# Patient Record
Sex: Male | Born: 1935
Health system: Southern US, Community
[De-identification: ages and names within clinical notes are randomized; demographics above are authoritative.]

## PROBLEM LIST (undated history)

## (undated) DIAGNOSIS — Z9189 Other specified personal risk factors, not elsewhere classified: Secondary | ICD-10-CM

## (undated) DIAGNOSIS — Z972 Presence of dental prosthetic device (complete) (partial): Secondary | ICD-10-CM

## (undated) DIAGNOSIS — Z8679 Personal history of other diseases of the circulatory system: Secondary | ICD-10-CM

## (undated) DIAGNOSIS — Z974 Presence of external hearing-aid: Secondary | ICD-10-CM

## (undated) DIAGNOSIS — R413 Other amnesia: Secondary | ICD-10-CM

## (undated) DIAGNOSIS — N189 Chronic kidney disease, unspecified: Secondary | ICD-10-CM

## (undated) DIAGNOSIS — N281 Cyst of kidney, acquired: Secondary | ICD-10-CM

## (undated) DIAGNOSIS — N201 Calculus of ureter: Secondary | ICD-10-CM

## (undated) DIAGNOSIS — I6523 Occlusion and stenosis of bilateral carotid arteries: Secondary | ICD-10-CM

## (undated) DIAGNOSIS — E785 Hyperlipidemia, unspecified: Secondary | ICD-10-CM

## (undated) DIAGNOSIS — K219 Gastro-esophageal reflux disease without esophagitis: Secondary | ICD-10-CM

## (undated) DIAGNOSIS — T4145XA Adverse effect of unspecified anesthetic, initial encounter: Secondary | ICD-10-CM

## (undated) DIAGNOSIS — N200 Calculus of kidney: Secondary | ICD-10-CM

## (undated) DIAGNOSIS — T8859XA Other complications of anesthesia, initial encounter: Secondary | ICD-10-CM

## (undated) DIAGNOSIS — L409 Psoriasis, unspecified: Secondary | ICD-10-CM

## (undated) DIAGNOSIS — N529 Male erectile dysfunction, unspecified: Secondary | ICD-10-CM

## (undated) DIAGNOSIS — I471 Supraventricular tachycardia, unspecified: Secondary | ICD-10-CM

## (undated) DIAGNOSIS — Z973 Presence of spectacles and contact lenses: Secondary | ICD-10-CM

## (undated) DIAGNOSIS — R0989 Other specified symptoms and signs involving the circulatory and respiratory systems: Secondary | ICD-10-CM

## (undated) DIAGNOSIS — I440 Atrioventricular block, first degree: Secondary | ICD-10-CM

## (undated) DIAGNOSIS — K08109 Complete loss of teeth, unspecified cause, unspecified class: Secondary | ICD-10-CM

## (undated) DIAGNOSIS — I10 Essential (primary) hypertension: Secondary | ICD-10-CM

## (undated) DIAGNOSIS — Z9889 Other specified postprocedural states: Secondary | ICD-10-CM

## (undated) DIAGNOSIS — Z87442 Personal history of urinary calculi: Secondary | ICD-10-CM

## (undated) DIAGNOSIS — R002 Palpitations: Secondary | ICD-10-CM

## (undated) DIAGNOSIS — I2510 Atherosclerotic heart disease of native coronary artery without angina pectoris: Secondary | ICD-10-CM

## (undated) HISTORY — PX: COLONOSCOPY W/ POLYPECTOMY: SHX1380

## (undated) HISTORY — PX: EYE SURGERY: SHX253

## (undated) HISTORY — PX: CHOLECYSTECTOMY: SHX55

## (undated) HISTORY — DX: Other amnesia: R41.3

## (undated) HISTORY — DX: Essential (primary) hypertension: I10

## (undated) HISTORY — PX: CARDIAC CATHETERIZATION: SHX172

---
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
## 1979-01-14 HISTORY — PX: NEPHROLITHOTOMY: SUR881

## 1980-01-14 HISTORY — PX: CHOLECYSTECTOMY OPEN: SUR202

## 1999-07-03 ENCOUNTER — Ambulatory Visit (HOSPITAL_COMMUNITY): Admission: RE | Admit: 1999-07-03 | Discharge: 1999-07-03 | Payer: Self-pay | Admitting: Internal Medicine

## 1999-07-03 ENCOUNTER — Encounter: Payer: Self-pay | Admitting: Internal Medicine

## 1999-08-16 ENCOUNTER — Encounter (INDEPENDENT_AMBULATORY_CARE_PROVIDER_SITE_OTHER): Payer: Self-pay | Admitting: Specialist

## 1999-08-16 ENCOUNTER — Ambulatory Visit (HOSPITAL_COMMUNITY): Admission: RE | Admit: 1999-08-16 | Discharge: 1999-08-16 | Payer: Self-pay | Admitting: *Deleted

## 2002-11-06 ENCOUNTER — Encounter: Admission: RE | Admit: 2002-11-06 | Discharge: 2002-11-06 | Payer: Self-pay | Admitting: Orthopedic Surgery

## 2002-11-06 ENCOUNTER — Encounter: Payer: Self-pay | Admitting: Orthopedic Surgery

## 2002-11-17 ENCOUNTER — Ambulatory Visit (HOSPITAL_COMMUNITY): Admission: RE | Admit: 2002-11-17 | Discharge: 2002-11-17 | Payer: Self-pay | Admitting: Internal Medicine

## 2002-11-23 ENCOUNTER — Ambulatory Visit (HOSPITAL_COMMUNITY): Admission: RE | Admit: 2002-11-23 | Discharge: 2002-11-23 | Payer: Self-pay | Admitting: Orthopedic Surgery

## 2002-11-23 HISTORY — PX: KNEE ARTHROSCOPY W/ DEBRIDEMENT: SHX1867

## 2003-01-14 HISTORY — PX: CARPAL TUNNEL RELEASE: SHX101

## 2003-01-25 ENCOUNTER — Encounter (INDEPENDENT_AMBULATORY_CARE_PROVIDER_SITE_OTHER): Payer: Self-pay | Admitting: Specialist

## 2003-01-25 ENCOUNTER — Ambulatory Visit (HOSPITAL_COMMUNITY): Admission: RE | Admit: 2003-01-25 | Discharge: 2003-01-25 | Payer: Self-pay | Admitting: *Deleted

## 2004-08-06 ENCOUNTER — Ambulatory Visit (HOSPITAL_COMMUNITY): Admission: RE | Admit: 2004-08-06 | Discharge: 2004-08-06 | Payer: Self-pay | Admitting: *Deleted

## 2006-11-07 ENCOUNTER — Emergency Department (HOSPITAL_COMMUNITY): Admission: EM | Admit: 2006-11-07 | Discharge: 2006-11-08 | Payer: Self-pay | Admitting: Emergency Medicine

## 2008-04-05 ENCOUNTER — Encounter (INDEPENDENT_AMBULATORY_CARE_PROVIDER_SITE_OTHER): Payer: Self-pay | Admitting: *Deleted

## 2008-04-05 ENCOUNTER — Ambulatory Visit (HOSPITAL_COMMUNITY): Admission: RE | Admit: 2008-04-05 | Discharge: 2008-04-05 | Payer: Self-pay | Admitting: *Deleted

## 2009-01-13 HISTORY — PX: CATARACT EXTRACTION, BILATERAL: SHX1313

## 2009-01-18 ENCOUNTER — Encounter: Admission: RE | Admit: 2009-01-18 | Discharge: 2009-01-18 | Payer: Self-pay | Admitting: Internal Medicine

## 2009-02-21 ENCOUNTER — Ambulatory Visit: Payer: Self-pay | Admitting: Vascular Surgery

## 2009-10-16 ENCOUNTER — Ambulatory Visit: Payer: Self-pay | Admitting: Cardiology

## 2010-03-12 ENCOUNTER — Encounter (INDEPENDENT_AMBULATORY_CARE_PROVIDER_SITE_OTHER): Payer: Medicare Other

## 2010-03-12 ENCOUNTER — Ambulatory Visit (INDEPENDENT_AMBULATORY_CARE_PROVIDER_SITE_OTHER): Payer: Medicare Other | Admitting: Vascular Surgery

## 2010-03-12 DIAGNOSIS — I714 Abdominal aortic aneurysm, without rupture: Secondary | ICD-10-CM

## 2010-03-13 NOTE — Assessment & Plan Note (Signed)
OFFICE VISIT  Brandon Fischer, Brandon Fischer DOB:  1933/10/27                                       03/12/2010 [id number]#:[id number]  The patient presents today for follow-up of infrarenal abdominal aortic aneurysm.  I had seen him 6 months ago, at which time he had undergone a screening study in an outlying vascular lab suggesting a 3.7-cm aneurysm.  I recommended that we see him again for follow-up.  He underwent a repeat ultrasound in our office today.  He reports no medical difficulties since my visit with him 1 year ago.  He has no major medical difficulties.  He is on Lipitor for elevated cholesterol and metoprolol for hypertension.  He does take daily aspirin.  On physical exam, a well-developed, well-nourished white male appearing stated age of 41, in no acute distress.  Blood pressure is 152/71, pulse 65, respirations 20.  HEENT is normal.  Abdomen:  Moderately obese.  I do not palpate an aneurysm.  Musculoskeletal:  No major deformity or cyanosis.  Neurologic:  No focal weakness or paresthesias.  Skin: Without ulcers or rashes.  His radial pulses and femoral pulses are 2+. He has 1+ to 2+ popliteal pulse and 1+ posterior tibial pulses bilaterally.  He underwent repeat ultrasound in our office today, and this reveals significantly a smaller aneurysm than was seen on the outside lab, maximal diameter was 2.8 cm.  I discussed this at length with the patient and his wife.  I have recommend discontinuation of any further follow-up.  I explained that this is below threshold of aneurysm and is simply ectasia of his aorta.  At his age of 45 I would not recommend a follow-up.  He understands and will see [place] again on an as-needed basis.    Larina Earthly, M.D. Electronically Signed  TFE/MEDQ  D:  03/12/2010  T:  03/13/2010  Job:  [id number]  cc:   Soyla Murphy. Renne Crigler, M.D.

## 2010-03-14 ENCOUNTER — Other Ambulatory Visit: Payer: Self-pay | Admitting: Gastroenterology

## 2010-03-20 ENCOUNTER — Ambulatory Visit
Admission: RE | Admit: 2010-03-20 | Discharge: 2010-03-20 | Disposition: A | Discharge status: Home / Self Care | Payer: Medicare Other | Source: Ambulatory Visit | Attending: Gastroenterology | Admitting: Gastroenterology

## 2010-03-27 NOTE — Procedures (Unsigned)
DUPLEX ULTRASOUND OF ABDOMINAL AORTA  INDICATION:  Followup AAA found on screening.  HISTORY: Diabetes:  No. Cardiac:  No. Hypertension:  No. Smoking:  Quit. Connective Tissue Disorder: Family History:  No. Previous Surgery:  No.  DUPLEX EXAM:         AP (cm)                   TRANSVERSE (cm) Proximal             2.8 cm                    NV Mid                  1.60 cm                     NV Distal               1.55 cm                   NV Right Iliac          0.92 cm                   NV Left Iliac           0.77 cm                   NV  PREVIOUS:  Date:  01/18/2009  AP:  3.7  TRANSVERSE:  IMPRESSION:  No evidence of obvious abdominal aortic aneurysm on today's exam.  ___________________________________________ Larina Earthly, M.D.  LT/MEDQ  D:  03/12/2010  T:  03/12/2010  Job:  [id number]

## 2010-05-28 NOTE — Consult Note (Signed)
NEW PATIENT CONSULTATION   Brandon Fischer, Brandon Fischer  DOB:  [birth year]                                       02/21/2009  ZOXWR#:[id number]   This patient presents today for evaluation of incidental finding of an  abdominal aortic aneurysm.  He underwent a screening ultrasound and was  found to have a 3.7-cm aneurysm.  He has no symptoms referable to this.  He does not have history of prior peripheral vascular occlusive disease.  He does not have any family history of aneurysm.  He does have no major  medical difficulties in the past, specifically no cardiac or pulmonary  disease.  He does have some gastroesophageal reflux.   PAST MEDICAL HISTORY:  Significant for psoriasis, adenomatous polyp,  chronic liver enzyme elevation and chronic kidney stones.   ALLERGIES:  None.   SOCIAL HISTORY:  He is a retired Doctor, hospital.  He does remain quite  active.  He quit smoking in 2000.  He does not drink alcohol.   FAMILY HISTORY:  Mother died of cancer at age 72.  Father died of  cardiac death at age 35.   REVIEW OF SYSTEMS:  Negative for weight loss or weight gain, for cardiac  chest issues.  PULMONARY:  Negative.  GI:  Negative.  GU:  Negative.  NEUROLOGIC:  Negative for seizure or stroke.  MUSCULOSKELETAL:  Negative for arthritic joint pain.  PSYCHIATRIC:  No history of depression or anxiety.  HEMATOLOGIC:  No hematologic issues.  SKIN:  No skin issues other than the psoriasis.   PHYSICAL EXAMINATION:  General:  Well-developed, well-nourished white  male appearing stated age of 77.  Vital signs:  His blood pressure  145/79, heart rate 64, respirations 18.  He has no acute distress.  HEENT:  Normal.  Chest:  No JVD, cervical lymphadenopathy or bruits.  Lungs:  Clear to auscultation bilaterally without wheezing.  Cardiovascular:  Heart is regular rate and rhythm without murmurs.  He  has 2+ radial, femoral, popliteal and posterior tibial pulses but no  evidence of  peripheral aneurysms.  Abdomen:  Soft.  Mildly obese with no  palpable mass and no prominent aortic pulsation is noted.  Musculoskeletal:  No major deformities or cyanosis.  Neurologic:  No  focal weakness or paresthesias.  Skin:  No ulcers or rashes.   I reviewed his ultrasound with the patient from 01/18/2009.  This does  show a maximal diameter of 3.7 cm.  I have discussed __________ with the  patient and explained that he does not have any change in his current  activity and does not need to restrict his activity or lifting or  straining.  I did explain symptoms of leaking aneurysm to him and he  knows to present immediately to [facility] should this occur, otherwise  we will see him again in 1 year with repeat ultrasound at that time.     Larina Earthly, M.D.  Electronically Signed   TFE/MEDQ  D:  02/21/2009  T:  02/21/2009  Job:  [id number]   cc:   Soyla Murphy. Renne Crigler, M.D.

## 2010-05-28 NOTE — Op Note (Signed)
NAME:  Brandon Fischer, Brandon Fischer              ACCOUNT NO.:  [id number]   MEDICAL RECORD NO.:  [id number]          PATIENT TYPE:  AMB   LOCATION:  ENDO                         FACILITY:  [facility]   PHYSICIAN:  Georgiana Spinner, M.D.    DATE OF BIRTH:  [birth year]   DATE OF PROCEDURE:  04/05/2008  DATE OF DISCHARGE:                               OPERATIVE REPORT   PROCEDURE:  Colonoscopy with biopsy.   INDICATIONS:  Colon polyp.   ANESTHESIA:  Fentanyl 70 mcg, Versed 7 mg.   PROCEDURE:  With the patient mildly sedated in the left lateral  decubitus position, a rectal examination was performed which was  unremarkable to my examination.  Subsequently the Pentax videoscopic  colonoscope was inserted in the rectum and passed under direct vision to  the cecum, identified by ileocecal valve and appendiceal orifice, both  of which were photographed.  From this point colonoscope was slowly  withdrawn taking circumferential views of colonic mucosa, stopping in  the hepatic flexure where a small polyp was seen, photographed and  removed using hot biopsy forceps technique, setting of 20/150 blended  current.  The endoscope was then withdrawn all the way to the rectum  which appeared normal on direct.  Showed hemorrhoids on retroflexed  view.  The endoscope was straightened and withdrawn.  The patient's  vital signs and  pulse oximeter remained stable.  The patient tolerated  procedure well without apparent complications.   FINDINGS:  Internal hemorrhoids and polyp of hepatic flexure.  Await  biopsy report.  The patient will call me for results and follow up with  me as an outpatient..           ______________________________  Georgiana Spinner, M.D.     GMO/MEDQ  D:  04/05/2008  T:  04/05/2008  Job:  [id number]

## 2010-05-31 NOTE — Op Note (Signed)
NAME:  Brandon Fischer, Brandon Fischer                        ACCOUNT NO.:  [id number]   MEDICAL RECORD NO.:  [id number]                   PATIENT TYPE:  AMB   LOCATION:  ENDO                                 [facility]ACILITY:  [facility]   PHYSICIAN:  Georgiana Spinner, M.D.                 DATE OF BIRTH:  May 03, 1933   DATE OF PROCEDURE:  01/25/2003  DATE OF DISCHARGE:                                 OPERATIVE REPORT   PROCEDURE:  Colonoscopy.   INDICATIONS:  Colon polyps.   ANESTHESIA:  Demerol 40 mg, Versed 3 mg.   DESCRIPTION OF PROCEDURE:  With the patient mildly sedated in the left  lateral decubitus position, the Olympus videoscopic colonoscope was inserted  into the rectum  and passed under direct vision to the cecum; this was  identified by the crow's foot of the cecum.  From this point the colonoscope  was slowly withdrawn, taking circumferential views of the colonic mucosa,  stopping first to biopsy part of the crow's foot that appeared somewhat  thickened.  This may have been normal, but I could not view it well because  of glare from the scope.  Once this was accomplished the scope was  withdrawn, taking circumferential views of the colonic mucosa.  We stopped  only next in the rectum, which appeared normal in direct and showed  hemorrhoids with the retroflex view.  The scope was straightened and  withdrawn.  The patient's vital signs and pulse oximetry remained stable.  The patient tolerated the procedure well and all without apparent  complications.   FINDINGS:  1. Thickened crow's foot of the cecum.  2. Internal hemorrhoids.  3. Otherwise unremarkable examination.   PLAN:  Await biopsy report.  The patient will call me for results and follow  up with me as an outpatient.                                               Georgiana Spinner, M.D.    GMO/MEDQ  D:  01/25/2003  T:  01/25/2003  Job:  [id number]

## 2010-05-31 NOTE — Cardiovascular Report (Signed)
NAME:  Brandon Fischer, Brandon Fischer NO.:  [id number]   MEDICAL RECORD NO.:  [id number]          PATIENT TYPE:  OIB   LOCATION:  2899                         FACILITY:  [facility]   PHYSICIAN:  Elmore Guise., M.D.DATE OF BIRTH:  [birth year]   DATE OF PROCEDURE:  08/06/2004  DATE OF DISCHARGE:                              CARDIAC CATHETERIZATION   PROCEDURE:  Cardiac catheterization.   INDICATIONS FOR PROCEDURE:  Abnormal stress test.   DESCRIPTION OF PROCEDURE:  The patient was brought to the cardiac cath lab  after proper informed consent.  He was prepped and draped in a sterile  fashion.  Approximately 15 mL of 1% lidocaine were used for local  anesthesia.  A 6-French sheath was placed in the right femoral artery  without difficulty.  Coronary angiography and LV angiography were then  performed.  The patient tolerated procedure well.  He was transferred from  the cardiac cath lab in stable condition.   RESULTS:  1.  Left main:  Mild diffuse calcification with distal 20% to 30% tapering      stenosis.  2.  LAD:  Ostial proximal 20% to 30% stenosis with mild mid and distal      luminal irregularities.  3.  LCX:  Nondominant, ostial proximal 30% to 40% stenosis with mid and      distal luminal irregularities.  4.  OM-1 and OM-2:  Moderate size with mild luminal irregularities.  5.  RCA:  Dominant with proximal 20% to 30% stenosis followed by a mid-      luminal irregularities and distal 40% stenosis.  6.  PDA and PLV:  Show moderate luminal irregularities, but no significant      obstructive disease.  7.  LV:  EF is 55% to 60% and no wall motion abnormalities.  LVEDP is 26      mmHg.   IMPRESSION:  1.  Nonobstructive coronary arteries.  2.  Preserved  left ventricular systolic function with an ejection fraction      of 55% to 60%.   PLAN:  Aggressive risk factor modification to include lipid management and  blood pressure control as well as daily aspirin.       TWK/MEDQ  D:  08/06/2004  T:  08/06/2004  Job:  [id number]

## 2010-05-31 NOTE — Op Note (Signed)
NAME:  Brandon Fischer, Brandon Fischer                        ACCOUNT NO.:  [id number]   MEDICAL RECORD NO.:  [id number]                   PATIENT TYPE:  AMB   LOCATION:  DAY                                  FACILITY:  [facility]   PHYSICIAN:  Madlyn Frankel. Charlann Boxer, M.D.               DATE OF BIRTH:  01-22-1933   DATE OF PROCEDURE:  11/23/2002  DATE OF DISCHARGE:                                 OPERATIVE REPORT   PREOPERATIVE DIAGNOSES:  Right knee medial meniscal tear, right knee medial  plica, the knee tricompartmental chondromalacia.   POSTOPERATIVE DIAGNOSES/FINDINGS:  1. 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  2. Abundant anteromedial synovectomy with a large plica shelf medially.  3. Degenerative complex tear of the posterior horn of the medial meniscus.  4. Degenerative tear with cleavage component to the mid portion of the     lateral meniscus.   PROCEDURE:  1. Diagnostic operative arthroscopy with tricompartmental chondroplasty.  2. Medial and lateral meniscal debridement.  3. Anteromedial synovectomy.   SURGEON:  Madlyn Frankel. Charlann Boxer, M.D.   ASSISTANT:  None.   ANESTHESIA:  LMA.   COMPLICATIONS:  None apparent.   FINDINGS:  Above.   INDICATIONS FOR PROCEDURE:  Mr. Cromwell is a very pleasant 77 year old  gentleman who was seen in the clinic. Radiographs of his knee revealed some  evidence of degenerative change; however, he had persistent medial joint  line tenderness. A MRI was ordered which confirmed the diagnosis of medial  meniscal tear. There was no __________  lateral meniscus. Based on these  findings, I felt that he would be a good candidate for arthroscopic surgery  to debride the medial meniscus and relieve the symptoms and allow for normal  knee function. After discussing the risks and benefits, he consented for the  procedure.   DESCRIPTION OF PROCEDURE:  The patient was brought to the operative theatre.  Once adequate anesthesia and preoperative antibiotics were  administered, the  patient was positioned supine, the right lower extremity was then placed  into a leg holder. The right lower extremity was then prepped and draped in  a sterile fashion. Standard inferolateral, superolateral, inferomedial  portals were created for diagnostic evaluation of the knee. It was quite  obvious, the patient had abundant anterior synovitis with large plica shelf  on the anteromedial side. It was also evident that he had tricompartmental  degenerative changes mainly grade 2 but in the medial compartment it was  felt to be grade 3. Evaluation also revealed the posterior horn of the  medial meniscal tear as well as the lateral meniscal tear. Following  debridement of the synovium which would allow for adequate visualization of  the medial compartment, upbiting basket and shaver were used to debride the  posterior horn of the medial meniscus back to a stable level. Medial  compartment debridement was carried out using the shaver. The lateral  compartment was then entered, the mid  portion of the lateral meniscus was  noted to have a degenerative tear which was debrided using basket and shaver  as well.  Lateral femoral condyle was then debrided using a shaver.  The  patellofemoral joint was then debrided with most changes on the patellar  side of the lateral facet as  opposed to the trochlea. Trochlear changes  were more grade 1. Attention was directed to debriding the medial synovium  which had a tendency to obstruct the view into the patellofemoral joint and  definitely ran against the medial femoral condyles. Following all  procedures, the knee was irrigated and reexamined for any loose bodies. The  shaver was used to remove any free fragments floating. At this point, the  __________  was removed, the knee was evacuated of as much fluid as possible  and the portal sites reapproximated using 3-0 nylon. The knee was then  injected with 0.5% Marcaine with epinephrine.  The patient's knee was then  cleaned, dried and dressed sterilely with substantial dressings, sponges,  bulky dressing. The patient was taken to the recovery room in stable  condition.                                               Madlyn Frankel Charlann Boxer, M.D.    MDO/MEDQ  D:  11/23/2002  T:  11/23/2002  Job:  [id number]

## 2010-05-31 NOTE — Procedures (Signed)
[facility]  Patient:    Brandon Fischer, Brandon Fischer                     MRN: [id number] Proc. Date: 08/16/99 Adm. Date:  [id number] Attending:  Sabino Gasser                           Procedure Report  PROCEDURE PERFORMED:  Colonoscopy with polypectomy.  ENDOSCOPIST:  Sabino Gasser, M.D.  INDICATIONS:  Colon polyps.  ANESTHESIA:  Demerol 60 mg and Versed 7.5 mg.  DESCRIPTION OF PROCEDURE:  With the patient mildly sedated in the left lateral decubitus position, the Olympus videoscopic colonoscope was inserted into the rectum after a normal rectal exam and passed under direct vision to the cecum. The cecum identified by the ileocecal valve and appendiceal orifice both of which were photographed.  From this point, the colonoscope was slowly withdrawn taking circumferential views of the entire colonic mucosa stopping only at 50 cm from the anal verge where a small polyp was seen, photographed and removed using snare cautery technique with a setting of 20/20 blended current.  The endoscope was then withdrawn to the rectum which appeared normal on direct view and showed internal hemorrhoids on retroflex view of the endoscope, straightened and withdrawn.  The patients vital signs and pulse oximeter remained stable.  The patient tolerated the procedure well without apparent complications.  FINDINGS:  A polyp at 50 cm and internal hemorrhoids, otherwise unremarkable examination.  PLAN:  Repeat exam in one year. DD:  08/16/99 TD:  08/18/99 Job: [id number]

## 2010-05-31 NOTE — Op Note (Signed)
NAME:  Brandon Fischer, Brandon Fischer                        ACCOUNT NO.:  [id number]   MEDICAL RECORD NO.:  [id number]                   PATIENT TYPE:  AMB   LOCATION:  ENDO                                 [facility]ILITY[facility]  [facility]   PHYSICIAN:  Georgiana Spinner, M.D.                 DATE OF BIRTH:  October 25, 1933   DATE OF PROCEDURE:  DATE OF DISCHARGE:                                 OPERATIVE REPORT   PROCEDURE:  Upper endoscopy with biopsy.   INDICATIONS:  Gastroesophageal reflux disease.   ANESTHESIA:  Demerol 60 mg, Versed 6 mg.   DESCRIPTION OF PROCEDURE:  With the patient mildly sedated in the left  lateral decubitus position, the Olympus videoscopic endoscope was inserted  in the mouth and passed under direct vision through the esophagus into the  stomach.  The fundus and body appeared normal.  Antrum showed some  nodularity which was biopsied.  We entered into the duodenal bulb and second  portion.  Both appeared normal.  From this point, the endoscope was slowly  withdrawn taking circumferential views of the duodenal mucosa until the  endoscope then pulled back into the stomach, placed in retroflexion and  viewed the stomach from below.  The endoscope was then straightened,  withdrawn, taking circumferential views of the remaining gastric and  esophageal mucosa, stopping only in the distal esophagus to same the GE  junction to rule out Barrett's although endoscopically it was not obvious.  Endoscope was withdrawn.  The patient's vital signs and pulse oximetry  remained stable.  The patient tolerated the procedure well without apparent  complication.   FINDINGS:  1. Nodularity of the antrum, probably benign, biopsied.  2. Biopsy of the distal esophagus.   PLAN:  Await biopsy report.  The patient will call me for results and follow  up with me as an outpatient.                                               Georgiana Spinner, M.D.    GMO/MEDQ  D:  01/25/2003  T:  01/25/2003  Job:  [id number]

## 2010-05-31 NOTE — H&P (Signed)
NAME:  BUELL, PARCEL NO.:  [id number]   MEDICAL RECORD NO.:  [id number]          PATIENT TYPE:  OIB   LOCATION:                               FACILITY:  [facility]   PHYSICIAN:  Elmore Guise., M.D.DATE OF BIRTH:  08-24-1933   DATE OF ADMISSION:  08/06/2004  DATE OF DISCHARGE:                                HISTORY & PHYSICAL   PRIMARY PHYSICIAN:  Dr. Merri Brunette   HISTORY OF PRESENT ILLNESS:  This patient is a 77 year old white male with  no significant past medical history who presented to Dr. Carolee Rota office for  evaluation of left arm and left upper shoulder pain.  He stated that this  started in early June and it occasionally gets worse when he is up and  active.  He underwent a stress ECG exercising via Bruce protocol for 59  minutes, achieving a peak heart rate of 138 beats per minute corresponding  to greater than 85% of age-predicted maximum.  Patient had 1 mm ST  depression in leads V4-V6.  He does have strong family history of heart  disease in the male side of his family with his father dying of a heart  attack at age 77 and his brother having a heart attack in his mid to late  24s.  Otherwise, he is very active and has no significant complaints.  He  states that he has occasional numbness and tingling down his left forearm  which comes and goes and he underwent a nerve conduction test which was  negative for any nerve problems.  He has no orthopnea, no PND.  No  significant lower extremity edema.   REVIEW OF SYSTEMS:  Otherwise negative.   CURRENT MEDICATIONS:  1.  Aspirin 81 mg daily.  2.  Aleve p.r.n.   ALLERGIES:  None.   FAMILY HISTORY:  Positive for heart disease in his father and brother.  His  mother had ovarian cancer.   SOCIAL HISTORY:  He is retired from the [employer].  He is married.  He  walks approximately 30 minutes a day with brisk walk and swims in his  outdoor pool.  He drinks an occasional glass of alcohol and has  approximately 50-pack-year history of tobacco use.  However, he quit smoking  six years ago.   PAST SURGICAL HISTORY:  Left knee surgery and treatment of two kidney  stones.   PHYSICAL EXAMINATION:  VITAL SIGNS:  Weight is 213 pounds, blood pressure  160/78, his heart rate is 72 and regular.  GENERAL:  He is a very pleasant white male, alert and oriented x4.  No acute  distress.  NECK:  Supple.  No lymphadenopathy.  2+ carotids.  No JVD and no bruits.  LUNGS:  Clear.  HEART:  Regular with normal S1-S2.  ABDOMEN:  Soft, nontender, nondistended.  EXTREMITIES:  Warm with 2+ pulses and no edema.   EKG:  Stress test was reviewed and showed 1 mm of ST depression in the  lateral precordial leads as well as upsloping ST depression noted in the  inferior leads.  His last BUN  and creatinine were 12 and 1.1 with a total  cholesterol level of 196, triglycerides of 100, HDL of 32, and LDL of 143.  He did have a recent PT/INR which were normal at 11.9 and 1 and a PTT of  29.4.  His CBC showed a hemoglobin of 13.4, white count of 8.1, and  platelets of 205.  His resting EKG shows normal sinus rhythm, normal axis,  normal intervals with no significant ST or T-wave changes.   IMPRESSION:  1.  Abnormal stress ECG.  2.  Strong family history of coronary disease.  3.  Borderline dyslipidemia.   PLAN:  From cardiovascular standpoint I have asked him to continue aspirin  81 mg daily.  He will be scheduled for cardiac catheterization for  evaluation of any significant obstructive coronary disease.  Otherwise, I  would recommend aggressive medical management at this time.       TWK/MEDQ  D:  08/01/2004  T:  08/01/2004  Job:  [id number]

## 2010-07-12 ENCOUNTER — Ambulatory Visit: Payer: Medicare Other | Attending: Internal Medicine | Admitting: *Deleted

## 2010-07-12 DIAGNOSIS — H8110 Benign paroxysmal vertigo, unspecified ear: Secondary | ICD-10-CM | POA: Insufficient documentation

## 2010-07-12 DIAGNOSIS — IMO0001 Reserved for inherently not codable concepts without codable children: Secondary | ICD-10-CM | POA: Insufficient documentation

## 2010-07-15 ENCOUNTER — Ambulatory Visit: Payer: Medicare Other | Attending: Internal Medicine | Admitting: Physical Therapy

## 2010-07-15 DIAGNOSIS — IMO0001 Reserved for inherently not codable concepts without codable children: Secondary | ICD-10-CM | POA: Insufficient documentation

## 2010-07-15 DIAGNOSIS — H8110 Benign paroxysmal vertigo, unspecified ear: Secondary | ICD-10-CM | POA: Insufficient documentation

## 2010-07-18 ENCOUNTER — Encounter: Payer: Medicare Other | Admitting: Physical Therapy

## 2010-07-19 ENCOUNTER — Other Ambulatory Visit: Payer: Self-pay | Admitting: *Deleted

## 2010-07-19 MED ORDER — METOPROLOL TARTRATE 25 MG PO TABS: 25.0000 mg | ORAL_TABLET | Freq: Every day | ORAL | Status: DC

## 2010-07-19 NOTE — Telephone Encounter (Signed)
escribe medication per fax request  

## 2010-07-22 ENCOUNTER — Encounter: Payer: Medicare Other | Admitting: *Deleted

## 2010-07-24 ENCOUNTER — Encounter: Payer: Medicare Other | Admitting: Physical Therapy

## 2010-07-29 ENCOUNTER — Encounter: Payer: Medicare Other | Admitting: *Deleted

## 2010-07-31 ENCOUNTER — Encounter: Payer: Medicare Other | Admitting: Physical Therapy

## 2010-08-05 ENCOUNTER — Encounter: Payer: Medicare Other | Admitting: Physical Therapy

## 2010-08-08 ENCOUNTER — Encounter: Payer: Medicare Other | Admitting: Rehabilitative and Restorative Service Providers"

## 2010-10-05 ENCOUNTER — Encounter: Payer: Self-pay | Admitting: Cardiology

## 2010-10-21 ENCOUNTER — Other Ambulatory Visit: Payer: Self-pay | Admitting: Cardiology

## 2010-10-23 LAB — URINALYSIS, ROUTINE W REFLEX MICROSCOPIC
Nitrite: NEGATIVE
Urobilinogen, UA: 2 — ABNORMAL HIGH

## 2010-10-23 LAB — URINE MICROSCOPIC-ADD ON

## 2010-10-25 ENCOUNTER — Ambulatory Visit: Payer: Medicare Other | Admitting: Cardiology

## 2010-10-29 ENCOUNTER — Encounter: Payer: Self-pay | Admitting: Cardiology

## 2010-10-29 ENCOUNTER — Ambulatory Visit (INDEPENDENT_AMBULATORY_CARE_PROVIDER_SITE_OTHER): Payer: Medicare Other | Admitting: Cardiology

## 2010-10-29 VITALS — BP 125/67 | HR 71 | Ht 70.0 in | Wt 213.1 lb

## 2010-10-29 DIAGNOSIS — E785 Hyperlipidemia, unspecified: Secondary | ICD-10-CM

## 2010-10-29 DIAGNOSIS — I10 Essential (primary) hypertension: Secondary | ICD-10-CM | POA: Insufficient documentation

## 2010-10-29 DIAGNOSIS — R002 Palpitations: Secondary | ICD-10-CM

## 2010-10-29 NOTE — Assessment & Plan Note (Signed)
Blood pressure control is acceptable today. He will continue with Altace and metoprolol.

## 2010-10-29 NOTE — Progress Notes (Signed)
   Evlyn Clines Date of Birth: [birth year] Medical Record #[id number]  History of Present Illness: [name] is seen today for yearly followup. He has a history of palpitations and has been on chronic beta blocker therapy. He's never had any documented arrhythmia. He notes infrequent episodes where his heart beats are irregular. He's only had a couple of episodes over the past year. He said one or 2 episodes where he becomes weak and lightheaded associated with headache. This usually resolves with rest in 15-20 minutes. He has had no chest pain. He denies any increase in edema. He did see Dr. Arbie Cookey for evaluation of hospital abdominal aortic aneurysm but his follow up ultrasound was negative.  Current Outpatient Prescriptions on File Prior to Visit  Medication Sig Dispense Refill  . aspirin 81 MG tablet Take 81 mg by mouth daily.        [name] atorvastatin (LIPITOR) 40 MG tablet Take 40 mg by mouth daily.        . metoprolol tartrate (LOPRESSOR) 25 MG tablet Take 1 tablet (25 mg total) by mouth daily.  30 tablet  5  . Naproxen Sodium (ALEVE PO) Take by mouth as needed.        . ramipril (ALTACE) 2.5 MG capsule TAKE ONE CAPSULE BY MOUTH EVERY DAY  90 capsule  3    No Known Allergies  Past Medical History  Diagnosis Date  . Hypertension   . Dyslipidemia   . Coronary disease     Nonobstructive  . Mild aortic stenosis     Very   . Renal calculi   . Palpitation     and Tachycardia    Past Surgical History  Procedure Date  . Knee surgery     Status post left  . Kidney stone surgery     Status post removal of 2  . Cholecystectomy     Status post    History  Smoking status  . Never Smoker   Smokeless tobacco  . Not on file    History  Alcohol Use No    No family history on file.  Review of Systems: As noted in history of present illness.  All other systems were reviewed and are negative.  Physical Exam: BP 125/67  Pulse 71  Ht 5\' 10"  (1.778 m)  Wt 213 lb 1.9 oz  (96.671 kg)  BMI 30.58 kg/m2 The patient is alert and oriented x 3.  The mood and affect are normal.  The skin is warm and dry.  Color is normal.  The HEENT exam reveals that the sclera are nonicteric.  The mucous membranes are moist. He does wear a hearing aid. The carotids are 2+ without bruits.  There is no thyromegaly.  There is no JVD.  The lungs are clear.  The chest wall is non tender.  The heart exam reveals a regular rate with a normal S1 and S2.  There are no murmurs, gallops, or rubs.  The PMI is not displaced.   Abdominal exam reveals good bowel sounds.  There is no guarding or rebound.  There is no hepatosplenomegaly or tenderness.  There are no masses.  Exam of the legs reveal no clubbing, cyanosis, or edema.  The legs are without rashes.  The distal pulses are intact.  Cranial nerves II - XII are intact.  Motor and sensory functions are intact.  The gait is normal.  LABORATORY DATA:   Assessment / Plan:

## 2010-10-29 NOTE — Assessment & Plan Note (Signed)
He has very infrequent symptoms of tachycardia. I instructed him to take an extra 25 mg of metoprolol and this happens. If his symptoms become more frequent or severe we will have him wear an event monitor. Otherwise he'll followup again in one year.

## 2010-10-29 NOTE — Patient Instructions (Signed)
If you have an episode of rapid heart beat, lie down and take an extra 25 mg of metoprolol.  If you have increased frequency or severity of episodes let me know and we will have you wear a monitor.  I will see you again in 1 year.

## 2011-01-09 ENCOUNTER — Other Ambulatory Visit: Payer: Self-pay | Admitting: Urology

## 2011-01-09 DIAGNOSIS — N200 Calculus of kidney: Secondary | ICD-10-CM

## 2011-01-10 ENCOUNTER — Ambulatory Visit (HOSPITAL_COMMUNITY)
Admission: RE | Admit: 2011-01-10 | Discharge: 2011-01-10 | Disposition: A | Discharge status: Home / Self Care | Payer: Medicare Other | Source: Ambulatory Visit | Attending: Urology | Admitting: Urology

## 2011-01-10 ENCOUNTER — Other Ambulatory Visit: Payer: Self-pay | Admitting: Radiology

## 2011-01-10 ENCOUNTER — Other Ambulatory Visit: Payer: Self-pay | Admitting: Urology

## 2011-01-10 ENCOUNTER — Encounter (HOSPITAL_COMMUNITY): Payer: Self-pay

## 2011-01-10 DIAGNOSIS — I10 Essential (primary) hypertension: Secondary | ICD-10-CM | POA: Insufficient documentation

## 2011-01-10 DIAGNOSIS — N1330 Unspecified hydronephrosis: Secondary | ICD-10-CM | POA: Insufficient documentation

## 2011-01-10 DIAGNOSIS — N200 Calculus of kidney: Secondary | ICD-10-CM

## 2011-01-10 DIAGNOSIS — N201 Calculus of ureter: Secondary | ICD-10-CM | POA: Insufficient documentation

## 2011-01-10 LAB — BASIC METABOLIC PANEL
Calcium: 9.3 mg/dL (ref 8.4–10.5)
Creatinine, Ser: 1.62 mg/dL — ABNORMAL HIGH (ref 0.50–1.35)
GFR calc Af Amer: 46 mL/min — ABNORMAL LOW (ref >90)
GFR calc non Af Amer: 40 mL/min — ABNORMAL LOW (ref >90)
Sodium: 140 mEq/L (ref 135–145)

## 2011-01-10 LAB — CBC
MCV: 84.9 fL (ref 78.0–100.0)
Platelets: 172 10*3/uL (ref 150–400)
RBC: 3.92 MIL/uL — ABNORMAL LOW (ref 4.22–5.81)
RDW: 17 % — ABNORMAL HIGH (ref 11.5–15.5)
WBC: 10.2 10*3/uL (ref 4.0–10.5)

## 2011-01-10 LAB — PROTIME-INR: Prothrombin Time: 14.5 seconds (ref 11.6–15.2)

## 2011-01-10 LAB — APTT: aPTT: 30 seconds (ref 24–37)

## 2011-01-10 MED ORDER — MIDAZOLAM HCL 2 MG/2ML IJ SOLN
INTRAMUSCULAR | Status: AC
  Filled 2011-01-10: qty 6

## 2011-01-10 MED ORDER — CIPROFLOXACIN IN D5W 400 MG/200ML IV SOLN
400.0000 mg | INTRAVENOUS | Status: AC
  Administered 2011-01-10: 400 mg via INTRAVENOUS
  Filled 2011-01-10 (×3): qty 200

## 2011-01-10 MED ORDER — FENTANYL CITRATE 0.05 MG/ML IJ SOLN
INTRAMUSCULAR | Status: AC
  Filled 2011-01-10: qty 6

## 2011-01-10 MED ORDER — SODIUM CHLORIDE 0.9 % IV SOLN: INTRAVENOUS | Status: DC

## 2011-01-10 MED ORDER — IOHEXOL 300 MG/ML  SOLN
50.0000 mL | Freq: Once | INTRAMUSCULAR | Status: AC | PRN
  Administered 2011-01-10: 20 mL

## 2011-01-10 MED ORDER — MIDAZOLAM HCL 5 MG/5ML IJ SOLN
INTRAMUSCULAR | Status: AC | PRN
  Administered 2011-01-10 (×3): 1 mg via INTRAVENOUS

## 2011-01-10 MED ORDER — HYDROCODONE-ACETAMINOPHEN 5-325 MG PO TABS: 1.0000 | ORAL_TABLET | Freq: Four times a day (QID) | ORAL | Status: DC | PRN

## 2011-01-10 MED ORDER — FENTANYL CITRATE 0.05 MG/ML IJ SOLN
INTRAMUSCULAR | Status: AC | PRN
  Administered 2011-01-10 (×3): 50 ug via INTRAVENOUS

## 2011-01-10 MED ORDER — CIPROFLOXACIN IN D5W 400 MG/200ML IV SOLN
400.0000 mg | Freq: Once | INTRAVENOUS | Status: DC
  Filled 2011-01-10: qty 200

## 2011-01-10 NOTE — H&P (Signed)
Agree with PA note. 

## 2011-01-10 NOTE — Procedures (Signed)
Post-Procedure Note  Pre-operative Diagnosis: Left renal calculi with new pain       Post-operative Diagnosis: Obstructing stones with left hydronephrosis   Indications: Left flank pain and renal calculi.  Procedure Details:   Moderate left hydronephrosis based on [place].  Placed 10 Fr. Nephrostomy in mid/upper pole without complication.  Findings: Moderate left hydronephrosis.  Obstructing stones in proximal left ureter.  Nephrostomy in left renal pelvis.  Complications: None     Condition: Good  Plan: Recovery in Short Stay for 4 hours.  Discharge home.  Plan for stone removal with Urology.

## 2011-01-10 NOTE — H&P (Signed)
Brandon Fischer is an 77 y.o. male.   Chief Complaint: Left renal stone; for OR 01/21/11 HPI: scheduled now for [place] left kidney possible Percutaneous nephrostomy placement and/or possible Nephro-ureteral catheter placement  Past Medical History  Diagnosis Date  . Hypertension   . Dyslipidemia   . Coronary disease     Nonobstructive  . Mild aortic stenosis     Very   . Renal calculi   . Palpitation     and Tachycardia    Past Surgical History  Procedure Date  . Knee surgery     Status post left  . Kidney stone surgery     Status post removal of 2  . Cholecystectomy     Status post    History reviewed. No pertinent family history. Social History:  reports that he has never smoked. He does not have any smokeless tobacco history on file. He reports that he does not drink alcohol. His drug history not on file.  Allergies: No Known Allergies  Medications Prior to Admission  Medication Sig Dispense Refill  . aspirin 81 MG tablet Take 81 mg by mouth daily.        [name] atorvastatin (LIPITOR) 40 MG tablet Take 40 mg by mouth daily.        . metoprolol tartrate (LOPRESSOR) 25 MG tablet Take 1 tablet (25 mg total) by mouth daily.  30 tablet  5  . Naproxen Sodium (ALEVE PO) Take by mouth as needed.        . ramipril (ALTACE) 2.5 MG capsule TAKE ONE CAPSULE BY MOUTH EVERY DAY  90 capsule  3   Medications Prior to Admission  Medication Dose Route Frequency Provider Last Rate Last Dose  . 0.9 %  sodium chloride infusion   Intravenous Continuous Brandon Leu, PA      . ciprofloxacin (CIPRO) IVPB 400 mg  400 mg Intravenous To 283         Results for orders placed during the hospital encounter of 01/10/11 (from the past 48 hour(s))  CBC     Status: Abnormal   Collection Time   01/10/11  8:00 AM      Component Value Range Comment   WBC 10.2  4.0 - 10.5 (K/uL)    RBC 3.92 (*) 4.22 - 5.81 (MIL/uL)    Hemoglobin 11.4 (*) 13.0 - 17.0 (g/dL)    HCT 11.9 (*) 14.7 - 52.0 (%)    MCV 84.9   78.0 - 100.0 (fL)    MCH 29.1  26.0 - 34.0 (pg)    MCHC 34.2  30.0 - 36.0 (g/dL)    RDW 82.9 (*) 56.2 - 15.5 (%)    Platelets 172  150 - 400 (K/uL)    No results found.  Review of Systems  Constitutional: Negative for fever.  Respiratory: Negative for cough.   Cardiovascular: Negative for chest pain.  Gastrointestinal: Negative for nausea, vomiting, abdominal pain and diarrhea.  Neurological: Negative for headaches.    Blood pressure 152/73, pulse 80, temperature 97.4 F (36.3 C), temperature source Oral, resp. rate 18, height 5\' 11"  (1.803 m), weight 213 lb (96.616 kg), SpO2 97.00%. Physical Exam  Constitutional: He is oriented to person, place, and time. He appears well-developed and well-nourished.  HENT:  Head: Normocephalic.  Eyes: EOM are normal.  Neck: Normal range of motion. Neck supple.  Cardiovascular: Normal rate, regular rhythm and normal heart sounds.   No murmur heard. Respiratory: He has no wheezes.  GI: Soft. Bowel sounds  are normal. He exhibits no mass.  Musculoskeletal: Normal range of motion.  Neurological: He is alert and oriented to person, place, and time.  Skin: Skin is warm and dry.     Assessment/Plan Left renal stone. Pt scheduled now for Renal US and possible L PCN/nephro-ureteral catheter placement.  Surgery with Brandon Fischer scheduled for 01/21/11 Pt and family aware of procedure benefits and risks and agreeable to proceed. Consent signed.  Brandon Fischer 01/10/2011, 8:34 AM

## 2011-01-13 ENCOUNTER — Other Ambulatory Visit: Payer: Self-pay | Admitting: Urology

## 2011-01-13 ENCOUNTER — Other Ambulatory Visit: Payer: Self-pay | Admitting: Radiology

## 2011-01-15 ENCOUNTER — Ambulatory Visit (HOSPITAL_COMMUNITY)
Admission: RE | Admit: 2011-01-15 | Discharge: 2011-01-15 | Disposition: A | Discharge status: Home / Self Care | Payer: Medicare Other | Source: Ambulatory Visit | Attending: Urology | Admitting: Urology

## 2011-01-15 ENCOUNTER — Encounter (HOSPITAL_COMMUNITY)
Admission: RE | Admit: 2011-01-15 | Discharge: 2011-01-15 | Disposition: A | Discharge status: Home / Self Care | Payer: Medicare Other | Source: Ambulatory Visit | Attending: Urology | Admitting: Urology

## 2011-01-15 ENCOUNTER — Encounter (HOSPITAL_COMMUNITY): Payer: Self-pay

## 2011-01-15 ENCOUNTER — Other Ambulatory Visit: Payer: Self-pay

## 2011-01-15 DIAGNOSIS — N400 Enlarged prostate without lower urinary tract symptoms: Secondary | ICD-10-CM | POA: Diagnosis not present

## 2011-01-15 DIAGNOSIS — N529 Male erectile dysfunction, unspecified: Secondary | ICD-10-CM | POA: Diagnosis not present

## 2011-01-15 DIAGNOSIS — Z01812 Encounter for preprocedural laboratory examination: Secondary | ICD-10-CM | POA: Diagnosis not present

## 2011-01-15 DIAGNOSIS — N201 Calculus of ureter: Secondary | ICD-10-CM | POA: Diagnosis not present

## 2011-01-15 DIAGNOSIS — Z7982 Long term (current) use of aspirin: Secondary | ICD-10-CM | POA: Diagnosis not present

## 2011-01-15 DIAGNOSIS — E7800 Pure hypercholesterolemia, unspecified: Secondary | ICD-10-CM | POA: Diagnosis not present

## 2011-01-15 DIAGNOSIS — Z79899 Other long term (current) drug therapy: Secondary | ICD-10-CM | POA: Diagnosis not present

## 2011-01-15 DIAGNOSIS — I10 Essential (primary) hypertension: Secondary | ICD-10-CM | POA: Diagnosis not present

## 2011-01-15 DIAGNOSIS — R310 Gross hematuria: Secondary | ICD-10-CM | POA: Diagnosis not present

## 2011-01-15 DIAGNOSIS — Z01811 Encounter for preprocedural respiratory examination: Secondary | ICD-10-CM | POA: Diagnosis not present

## 2011-01-15 HISTORY — DX: Gastro-esophageal reflux disease without esophagitis: K21.9

## 2011-01-15 HISTORY — DX: Atherosclerotic heart disease of native coronary artery without angina pectoris: I25.10

## 2011-01-15 LAB — SURGICAL PCR SCREEN
MRSA, PCR: NEGATIVE
Staphylococcus aureus: NEGATIVE

## 2011-01-15 LAB — CBC
HCT: 33.6 % — ABNORMAL LOW (ref 39.0–52.0)
Hemoglobin: 11.6 g/dL — ABNORMAL LOW (ref 13.0–17.0)
MCH: 28.6 pg (ref 26.0–34.0)
MCHC: 34.5 g/dL (ref 30.0–36.0)
MCV: 83 fL (ref 78.0–100.0)

## 2011-01-15 LAB — BASIC METABOLIC PANEL
BUN: 12 mg/dL (ref 6–23)
Chloride: 106 mEq/L (ref 96–112)
Creatinine, Ser: 1.08 mg/dL (ref 0.50–1.35)
Glucose, Bld: 126 mg/dL — ABNORMAL HIGH (ref 70–99)
Potassium: 4 mEq/L (ref 3.5–5.1)

## 2011-01-15 LAB — PROTIME-INR: Prothrombin Time: 13.7 seconds (ref 11.6–15.2)

## 2011-01-15 NOTE — H&P (Signed)
Active Problems Problems  1. Benign Prostatic Hypertrophy 600.00 2. Gross Hematuria 599.71 3. Nephrolithiasis Of The Left Kidney 592.0 4. Nephrolithiasis Of The Right Kidney 592.0 5. Organic Impotence 607.84 6. Simple Renal Cyst In The Right Kidney 593.2  History of Present Illness  Brandon Fischer returns today in f/u.  He has a 2cm Left UPJ stone and had only hematuria with it until Christmas night when he began to have severe left flank pain.  He does get relief with the pain med.  He had some nausea and vomiting with the pain.  He has had more hematuria.   Past Medical History Problems  1. History of  Arthritis V13.4 2. History of  Balanitis 607.1 3. History of  Hydronephrosis 591 4. History of  Hypercholesterolemia 272.0 5. History of  Hypertension 401.9 6. History of  Mid Ureteral Stone 592.1 7. History of  Nephrolithiasis V13.01  Surgical History Problems  1. History of  Cholecystectomy 2. History of  Knee Surgery 3. History of  Lithotomy  Current Meds 1. Adult Aspirin Low Strength 81 MG Oral Tablet Dispersible; Therapy: (Recorded:28Oct2008) to 2. Aleve TABS; Therapy: (Recorded:16Dec2011) to 3. Atorvastatin Calcium 80 MG Oral Tablet; Therapy: 18Sep2012 to 4. Cialis 20 MG Oral Tablet; TAKE 1 TABLET PRN; Therapy: 14Jun2011 to (Last Rx:16Dec2011) 5. Hydrocodone-Acetaminophen 10-500 MG Oral Tablet; 1-2 tabs po Q4-6  hrs prn pain; Therapy:  12Jan2011 to (Last Rx:19Dec2012) 6. Metoprolol Tartrate TABS; Therapy: (Recorded:29Jul2010) to 7. Ramipril 2.5 MG Oral Capsule; Therapy: (Recorded:28Oct2008) to  Allergies Medication  1. No Known Drug Allergies  Family History Problems  1. Paternal history of  Acute Myocardial Infarction V17.3 2. Maternal history of  Colon Cancer V16.0 3. Family history of  Death In The Family Father 31 4. Family history of  Death In The Family Mother 67 5. Family history of  Family Health Status Number Of Children 1 son 1 dgt  Social  History Problems  1. Caffeine Use pepsi, tea 1-2 cups 2. Marital History - Currently Married janice 3. Occupation: retired Fish farm manager 4. Tobacco Use V15.82 smoker for 40 yrs-quit 9 yrs ago Denied  5. Alcohol Use  Review of Systems  Constitutional: no fever.  Cardiovascular: no chest pain.  Respiratory: no shortness of breath.    Vitals Vital Signs [Data Includes: Last 1 Day]  27Dec2012 01:40PM  Blood Pressure: 147 / 80 Temperature: 97.7 F Heart Rate: 79  Physical Exam Constitutional: Well nourished and well developed . No acute distress.  Pulmonary: No respiratory distress and normal respiratory rhythm and effort.  Cardiovascular: Heart rate and rhythm are normal . No peripheral edema.  Abdomen: The abdomen is soft and nontender. No masses are palpated. No CVA tenderness. No hernias are palpable. No hepatosplenomegaly noted.    Results/Data Urine [Data Includes: Last 1 Day]   27Dec2012  COLOR YELLOW   APPEARANCE CLEAR   SPECIFIC GRAVITY 1.030   pH 5.5   GLUCOSE NEG mg/dL  BILIRUBIN SMALL   KETONE NEG mg/dL  BLOOD LARGE   PROTEIN 30 mg/dL  UROBILINOGEN 4 mg/dL  NITRITE NEG   LEUKOCYTE ESTERASE NEG   SQUAMOUS EPITHELIAL/HPF RARE   WBC 0-3 WBC/hpf  RBC 21-50 RBC/hpf  BACTERIA RARE   CRYSTALS NONE SEEN   CASTS NONE SEEN   Other SM. AMT. MUCUS OBS.    Assessment Assessed  1. Nephrolithiasis Of The Left Kidney 592.0 2. Gross Hematuria 599.71   His left renal stone is now causing pain requiring narcotic therapy.   Plan Gross Hematuria (  599.71)  1. OUTSIDE RAD MISC  Requested for: 27Dec2012 Health Maintenance (V70.0)  2. UA With REFLEX  Done: 27Dec2012 01:26PM Nephrolithiasis Of The Left Kidney (592.0)  3. Hydrocodone-Acetaminophen 10-500 MG Oral Tablet; 1-2 tabs po Q4-6  hrs prn pain; Therapy:  12Jan2011 to (Last Rx:27Dec2012) 4. Follow-up Schedule Surgery Office  Follow-up  Requested for: 27Dec2012   He was going to have a LPCNL in January but with his  symptoms I have suggested consideration of either a stent or a perc tube.   I would prefer to go ahead and have the perc tube placed.

## 2011-01-15 NOTE — Patient Instructions (Signed)
20 ALDIN DREES  01/15/2011   Your procedure is scheduled on:  Thursday 01/16/11 AT 8:00 AM  Report to RADIOLOGY DEPARTMENT  at  7:00 AM.  Call this number if you have problems the morning of surgery: [phone]   Remember:   Do not eat food OR DRINK ANYTHING AFTER MIDNIGHT TONIGHT.      Take these medicines the morning of surgery with A SIP OF WATER:  NO MEDS TO TAKE   Do not wear jewelry, make-up or nail polish.  Do not wear lotions, powders, or perfumes. You may wear deodorant.  Do not shave 48 hours prior to surgery.  Do not bring valuables to the hospital.  Contacts, dentures or bridgework may not be worn into surgery.  Leave suitcase in the car. After surgery it may be brought to your room.  For patients admitted to the hospital, checkout time is 11:00 AM the day of discharge.   Patients discharged the day of surgery will not be allowed to drive home.    Special Instructions: CHG Shower Use Special Wash: 1/2 bottle night before surgery and 1/2 bottle morning of surgery.   Please read over the following fact sheets that you were given: Blood Transfusion Information and MRSA Information

## 2011-01-15 NOTE — Pre-Procedure Instructions (Signed)
EKG AND CXR WERE DONE TODAY-PREOP AT [facility] 01/15/11 LAST CARDIOLOGY OFFICE NOTES 10/29/10 ON CHART FROM DR. PETER [place]

## 2011-01-16 ENCOUNTER — Encounter (HOSPITAL_COMMUNITY): Payer: Self-pay | Admitting: Anesthesiology

## 2011-01-16 ENCOUNTER — Ambulatory Visit (HOSPITAL_COMMUNITY): Payer: Medicare Other | Admitting: Anesthesiology

## 2011-01-16 ENCOUNTER — Ambulatory Visit (HOSPITAL_COMMUNITY)
Admission: RE | Admit: 2011-01-16 | Discharge: 2011-01-16 | Disposition: A | Discharge status: Home / Self Care | Payer: Medicare Other | Source: Ambulatory Visit | Attending: Urology | Admitting: Urology

## 2011-01-16 ENCOUNTER — Observation Stay (HOSPITAL_COMMUNITY)
Admission: RE | Admit: 2011-01-16 | Discharge: 2011-01-17 | Disposition: A | Discharge status: Home / Self Care | Payer: Medicare Other | Source: Ambulatory Visit | Attending: Urology | Admitting: Urology

## 2011-01-16 ENCOUNTER — Ambulatory Visit (HOSPITAL_COMMUNITY): Payer: Medicare Other

## 2011-01-16 ENCOUNTER — Other Ambulatory Visit: Payer: Self-pay | Admitting: Urology

## 2011-01-16 ENCOUNTER — Encounter (HOSPITAL_COMMUNITY)
Admission: RE | Disposition: A | Discharge status: Home / Self Care | Payer: Self-pay | Source: Ambulatory Visit | Attending: Urology

## 2011-01-16 DIAGNOSIS — I10 Essential (primary) hypertension: Secondary | ICD-10-CM | POA: Insufficient documentation

## 2011-01-16 DIAGNOSIS — N529 Male erectile dysfunction, unspecified: Secondary | ICD-10-CM | POA: Insufficient documentation

## 2011-01-16 DIAGNOSIS — N200 Calculus of kidney: Secondary | ICD-10-CM

## 2011-01-16 DIAGNOSIS — E7800 Pure hypercholesterolemia, unspecified: Secondary | ICD-10-CM | POA: Insufficient documentation

## 2011-01-16 DIAGNOSIS — Z09 Encounter for follow-up examination after completed treatment for conditions other than malignant neoplasm: Secondary | ICD-10-CM | POA: Diagnosis not present

## 2011-01-16 DIAGNOSIS — Z79899 Other long term (current) drug therapy: Secondary | ICD-10-CM | POA: Insufficient documentation

## 2011-01-16 DIAGNOSIS — I2510 Atherosclerotic heart disease of native coronary artery without angina pectoris: Secondary | ICD-10-CM | POA: Diagnosis not present

## 2011-01-16 DIAGNOSIS — N400 Enlarged prostate without lower urinary tract symptoms: Secondary | ICD-10-CM | POA: Insufficient documentation

## 2011-01-16 DIAGNOSIS — Z5189 Encounter for other specified aftercare: Secondary | ICD-10-CM | POA: Diagnosis not present

## 2011-01-16 DIAGNOSIS — K219 Gastro-esophageal reflux disease without esophagitis: Secondary | ICD-10-CM | POA: Diagnosis not present

## 2011-01-16 DIAGNOSIS — Z436 Encounter for attention to other artificial openings of urinary tract: Secondary | ICD-10-CM | POA: Diagnosis not present

## 2011-01-16 DIAGNOSIS — Z7982 Long term (current) use of aspirin: Secondary | ICD-10-CM | POA: Insufficient documentation

## 2011-01-16 DIAGNOSIS — N201 Calculus of ureter: Principal | ICD-10-CM | POA: Insufficient documentation

## 2011-01-16 DIAGNOSIS — R310 Gross hematuria: Secondary | ICD-10-CM | POA: Diagnosis not present

## 2011-01-16 DIAGNOSIS — N135 Crossing vessel and stricture of ureter without hydronephrosis: Secondary | ICD-10-CM | POA: Diagnosis not present

## 2011-01-16 DIAGNOSIS — Z01812 Encounter for preprocedural laboratory examination: Secondary | ICD-10-CM | POA: Insufficient documentation

## 2011-01-16 HISTORY — PX: NEPHROLITHOTOMY: SHX5134

## 2011-01-16 SURGERY — NEPHROLITHOTOMY PERCUTANEOUS
Anesthesia: General | Laterality: Left | Wound class: Clean

## 2011-01-16 MED ORDER — CIPROFLOXACIN IN D5W 400 MG/200ML IV SOLN
400.0000 mg | Freq: Once | INTRAVENOUS | Status: AC
  Administered 2011-01-16: 400 mg via INTRAVENOUS
  Filled 2011-01-16: qty 200

## 2011-01-16 MED ORDER — DOCUSATE SODIUM 100 MG PO CAPS
100.0000 mg | ORAL_CAPSULE | Freq: Two times a day (BID) | ORAL | Status: DC
  Administered 2011-01-16 – 2011-01-17 (×2): 100 mg via ORAL
  Filled 2011-01-16 (×3): qty 1

## 2011-01-16 MED ORDER — FENTANYL CITRATE 0.05 MG/ML IJ SOLN
INTRAMUSCULAR | Status: DC | PRN
  Administered 2011-01-16: 25 ug via INTRAVENOUS
  Administered 2011-01-16 (×3): 50 ug via INTRAVENOUS
  Administered 2011-01-16: 25 ug via INTRAVENOUS

## 2011-01-16 MED ORDER — CIPROFLOXACIN HCL 500 MG PO TABS: 500.0000 mg | ORAL_TABLET | Freq: Two times a day (BID) | ORAL | Status: AC

## 2011-01-16 MED ORDER — PROPOFOL 10 MG/ML IV BOLUS
INTRAVENOUS | Status: DC | PRN
  Administered 2011-01-16: 200 mg via INTRAVENOUS

## 2011-01-16 MED ORDER — IOHEXOL 300 MG/ML  SOLN
INTRAMUSCULAR | Status: DC | PRN
  Administered 2011-01-16: 10 mL via INTRAVENOUS

## 2011-01-16 MED ORDER — FENTANYL CITRATE 0.05 MG/ML IJ SOLN
INTRAMUSCULAR | Status: AC | PRN
  Administered 2011-01-16: 100 ug via INTRAVENOUS

## 2011-01-16 MED ORDER — SUCCINYLCHOLINE CHLORIDE 20 MG/ML IJ SOLN
INTRAMUSCULAR | Status: DC | PRN
  Administered 2011-01-16: 100 mg via INTRAVENOUS

## 2011-01-16 MED ORDER — LIDOCAINE HCL (CARDIAC) 20 MG/ML IV SOLN
INTRAVENOUS | Status: DC | PRN
  Administered 2011-01-16: 50 mg via INTRAVENOUS

## 2011-01-16 MED ORDER — ONDANSETRON HCL 4 MG/2ML IJ SOLN
INTRAMUSCULAR | Status: DC | PRN
  Administered 2011-01-16: 4 mg via INTRAVENOUS

## 2011-01-16 MED ORDER — ACETAMINOPHEN 325 MG PO TABS: 650.0000 mg | ORAL_TABLET | ORAL | Status: DC | PRN

## 2011-01-16 MED ORDER — IOHEXOL 300 MG/ML  SOLN
5.0000 mL | Freq: Once | INTRAMUSCULAR | Status: AC | PRN
  Administered 2011-01-16: 5 mL

## 2011-01-16 MED ORDER — ONDANSETRON HCL 4 MG/2ML IJ SOLN: 4.0000 mg | INTRAMUSCULAR | Status: DC | PRN

## 2011-01-16 MED ORDER — ZOLPIDEM TARTRATE 5 MG PO TABS: 5.0000 mg | ORAL_TABLET | Freq: Every evening | ORAL | Status: DC | PRN

## 2011-01-16 MED ORDER — OXYCODONE-ACETAMINOPHEN 5-325 MG PO TABS: 1.0000 | ORAL_TABLET | ORAL | Status: DC | PRN

## 2011-01-16 MED ORDER — SODIUM CHLORIDE 0.9 % IR SOLN
Status: DC | PRN
  Administered 2011-01-16: 7000 mL

## 2011-01-16 MED ORDER — OXYCODONE-ACETAMINOPHEN 5-325 MG PO TABS: 1.0000 | ORAL_TABLET | ORAL | Status: AC | PRN

## 2011-01-16 MED ORDER — ACETAMINOPHEN 10 MG/ML IV SOLN
INTRAVENOUS | Status: AC
  Filled 2011-01-16: qty 100

## 2011-01-16 MED ORDER — PROMETHAZINE HCL 25 MG/ML IJ SOLN: 6.2500 mg | INTRAMUSCULAR | Status: DC | PRN

## 2011-01-16 MED ORDER — CIPROFLOXACIN IN D5W 400 MG/200ML IV SOLN: 400.0000 mg | INTRAVENOUS | Status: DC

## 2011-01-16 MED ORDER — RAMIPRIL 2.5 MG PO CAPS
2.5000 mg | ORAL_CAPSULE | Freq: Once | ORAL | Status: AC
  Administered 2011-01-16: 2.5 mg via ORAL
  Filled 2011-01-16: qty 1

## 2011-01-16 MED ORDER — SODIUM CHLORIDE 0.9 % IV SOLN
INTRAVENOUS | Status: DC
  Administered 2011-01-16: 08:00:00 via INTRAVENOUS

## 2011-01-16 MED ORDER — LACTATED RINGERS IV SOLN: INTRAVENOUS | Status: DC

## 2011-01-16 MED ORDER — ROSUVASTATIN CALCIUM 20 MG PO TABS
20.0000 mg | ORAL_TABLET | Freq: Every day | ORAL | Status: DC
  Administered 2011-01-16 (×2): 20 mg via ORAL
  Filled 2011-01-16 (×2): qty 1

## 2011-01-16 MED ORDER — ONDANSETRON 4 MG PO TBDP: 4.0000 mg | ORAL_TABLET | Freq: Three times a day (TID) | ORAL | Status: AC | PRN

## 2011-01-16 MED ORDER — ACETAMINOPHEN 325 MG PO TABS
650.0000 mg | ORAL_TABLET | ORAL | Status: DC | PRN
  Filled 2011-01-16: qty 2

## 2011-01-16 MED ORDER — MIDAZOLAM HCL 5 MG/5ML IJ SOLN
INTRAMUSCULAR | Status: AC | PRN
  Administered 2011-01-16: 2 mg via INTRAVENOUS

## 2011-01-16 MED ORDER — LACTATED RINGERS IV SOLN
INTRAVENOUS | Status: DC | PRN
  Administered 2011-01-16 (×2): via INTRAVENOUS

## 2011-01-16 MED ORDER — METOPROLOL TARTRATE 25 MG PO TABS
25.0000 mg | ORAL_TABLET | Freq: Every evening | ORAL | Status: DC
  Administered 2011-01-16: 25 mg via ORAL
  Filled 2011-01-16 (×2): qty 1

## 2011-01-16 MED ORDER — KCL IN DEXTROSE-NACL 20-5-0.45 MEQ/L-%-% IV SOLN
INTRAVENOUS | Status: DC
  Administered 2011-01-16 – 2011-01-17 (×3): via INTRAVENOUS
  Filled 2011-01-16 (×6): qty 1000

## 2011-01-16 MED ORDER — HYOSCYAMINE SULFATE 0.125 MG SL SUBL
0.1250 mg | SUBLINGUAL_TABLET | SUBLINGUAL | Status: DC | PRN
  Filled 2011-01-16: qty 1

## 2011-01-16 MED ORDER — ACETAMINOPHEN 10 MG/ML IV SOLN
1000.0000 mg | Freq: Four times a day (QID) | INTRAVENOUS | Status: AC
  Administered 2011-01-16 – 2011-01-17 (×4): 1000 mg via INTRAVENOUS
  Filled 2011-01-16 (×8): qty 100

## 2011-01-16 MED ORDER — FENTANYL CITRATE 0.05 MG/ML IJ SOLN: 25.0000 ug | INTRAMUSCULAR | Status: DC | PRN

## 2011-01-16 MED ORDER — RAMIPRIL 2.5 MG PO CAPS
2.5000 mg | ORAL_CAPSULE | Freq: Every day | ORAL | Status: DC
  Administered 2011-01-17: 2.5 mg via ORAL
  Filled 2011-01-16 (×2): qty 1

## 2011-01-16 MED ORDER — HYDROMORPHONE HCL PF 1 MG/ML IJ SOLN
0.5000 mg | INTRAMUSCULAR | Status: DC | PRN
  Administered 2011-01-16 (×2): 1 mg via INTRAVENOUS
  Filled 2011-01-16 (×2): qty 1

## 2011-01-16 MED ORDER — IOHEXOL 300 MG/ML  SOLN
INTRAMUSCULAR | Status: AC
  Filled 2011-01-16: qty 2

## 2011-01-16 MED ORDER — DEXAMETHASONE SODIUM PHOSPHATE 10 MG/ML IJ SOLN
INTRAMUSCULAR | Status: DC | PRN
  Administered 2011-01-16: 10 mg via INTRAVENOUS

## 2011-01-16 MED ORDER — CIPROFLOXACIN HCL 500 MG PO TABS
500.0000 mg | ORAL_TABLET | Freq: Two times a day (BID) | ORAL | Status: DC
  Administered 2011-01-16 – 2011-01-17 (×2): 500 mg via ORAL
  Filled 2011-01-16 (×3): qty 1

## 2011-01-16 SURGICAL SUPPLY — 43 items
APL SKNCLS STERI-STRIP NONHPOA (GAUZE/BANDAGES/DRESSINGS) ×1
BAG URINE DRAINAGE (UROLOGICAL SUPPLIES) ×2 IMPLANT
BASKET ZERO TIP NITINOL 2.4FR (BASKET) ×2 IMPLANT
BENZOIN TINCTURE PRP APPL 2/3 (GAUZE/BANDAGES/DRESSINGS) ×5 IMPLANT
BLADE SURG 15 STRL LF DISP TIS (BLADE) ×1 IMPLANT
BLADE SURG 15 STRL SS (BLADE) ×2
BSKT STON RTRVL ZERO TP 2.4FR (BASKET) ×1
CATCHER STONE W/TUBE ADAPTER (UROLOGICAL SUPPLIES) ×1 IMPLANT
CATH AINSWORTH 30CC 24FR (CATHETERS) ×1 IMPLANT
CATH FOLEY 2W COUNCIL 20FR 5CC (CATHETERS) IMPLANT
CATH ROBINSON RED A/P 20FR (CATHETERS) IMPLANT
CATH X-FORCE N30 NEPHROSTOMY (TUBING) ×2 IMPLANT
CLOTH BEACON ORANGE TIMEOUT ST (SAFETY) ×2 IMPLANT
COVER SURGICAL LIGHT HANDLE (MISCELLANEOUS) ×2 IMPLANT
DRAPE C-ARM 42X72 X-RAY (DRAPES) ×2 IMPLANT
DRAPE CAMERA CLOSED 9X96 (DRAPES) ×2 IMPLANT
DRAPE LINGEMAN PERC (DRAPES) ×2 IMPLANT
DRAPE SURG IRRIG POUCH 19X23 (DRAPES) ×2 IMPLANT
DRSG TEGADERM 8X12 (GAUZE/BANDAGES/DRESSINGS) ×4 IMPLANT
GLOVE SURG SS PI 8.0 STRL IVOR (GLOVE) ×2 IMPLANT
GOWN STRL REIN XL XLG (GOWN DISPOSABLE) ×2 IMPLANT
GUIDEWIRE STR DUAL SENSOR (WIRE) ×1 IMPLANT
KIT BASIN OR (CUSTOM PROCEDURE TRAY) ×2 IMPLANT
LASER FIBER DISP (UROLOGICAL SUPPLIES) ×1 IMPLANT
LASER FIBER DISP 1000U (UROLOGICAL SUPPLIES) IMPLANT
MANIFOLD NEPTUNE II (INSTRUMENTS) ×2 IMPLANT
NS IRRIG 1000ML POUR BTL (IV SOLUTION) ×2 IMPLANT
PACK BASIC VI WITH GOWN DISP (CUSTOM PROCEDURE TRAY) ×2 IMPLANT
PAD ABD 7.5X8 STRL (GAUZE/BANDAGES/DRESSINGS) ×4 IMPLANT
PROBE LITHOCLAST ULTRA 3.8X403 (UROLOGICAL SUPPLIES) ×2 IMPLANT
PROBE PNEUMATIC 1.0MMX570MM (UROLOGICAL SUPPLIES) ×2 IMPLANT
SET IRRIG Y TYPE TUR BLADDER L (SET/KITS/TRAYS/PACK) ×2 IMPLANT
SET WARMING FLUID IRRIGATION (MISCELLANEOUS) ×1 IMPLANT
SPONGE GAUZE 4X4 12PLY (GAUZE/BANDAGES/DRESSINGS) ×2 IMPLANT
SPONGE LAP 4X18 X RAY DECT (DISPOSABLE) ×2 IMPLANT
STONE CATCHER W/TUBE ADAPTER (UROLOGICAL SUPPLIES) ×2 IMPLANT
SUT SILK 2 0 30  PSL (SUTURE) ×1
SUT SILK 2 0 30 PSL (SUTURE) ×1 IMPLANT
SYR 20CC LL (SYRINGE) ×3 IMPLANT
SYRINGE 10CC LL (SYRINGE) ×2 IMPLANT
TOWEL OR NON WOVEN STRL DISP B (DISPOSABLE) ×2 IMPLANT
TRAY FOLEY CATH 14FRSI W/METER (CATHETERS) ×2 IMPLANT
TUBING CONNECTING 10 (TUBING) ×4 IMPLANT

## 2011-01-16 NOTE — Progress Notes (Signed)
Patient stable- waiting for room- holding time begun- report given to [facility], [place]. For continued PACU CARE

## 2011-01-16 NOTE — Progress Notes (Signed)
HGB. 10.7- HCT. 30.8- RESULTS NOTED IN PACU

## 2011-01-16 NOTE — Anesthesia Procedure Notes (Signed)
Procedure Name: Intubation Date/Time: 01/16/2011 9:50 AM Performed by: Joycie Peek Pre-anesthesia Checklist: Patient identified, Timeout performed, Emergency Drugs available, Suction available and Patient being monitored Patient Re-evaluated:Patient Re-evaluated prior to inductionOxygen Delivery Method: Circle System Utilized Preoxygenation: Pre-oxygenation with 100% oxygen Intubation Type: IV induction Laryngoscope Size: Mac and 4 Grade View: Grade I Tube type: Oral Tube size: 7.5 mm Number of attempts: 1 Airway Equipment and Method: stylet Placement Confirmation: ETT inserted through vocal cords under direct vision,  breath sounds checked- equal and bilateral and positive ETCO2 Secured at: 23 cm Tube secured with: Tape Dental Injury: Teeth and Oropharynx as per pre-operative assessment

## 2011-01-16 NOTE — Transfer of Care (Signed)
Immediate Anesthesia Transfer of Care Note  Patient: Brandon Fischer  Procedure(s) Performed:  NEPHROLITHOTOMY PERCUTANEOUS - C-Arm  Holmium Laser  Patient Location: PACU  Anesthesia Type: General  Level of Consciousness: sedated  Airway & Oxygen Therapy: Patient Spontanous Breathing and Patient connected to face mask oxygen  Post-op Assessment: Report given to PACU RN and Post -op Vital signs reviewed and stable  Post vital signs: Reviewed and stable  Complications: No apparent anesthesia complications

## 2011-01-16 NOTE — Anesthesia Preprocedure Evaluation (Addendum)
Anesthesia Evaluation  Patient identified by MRN, date of birth, ID band Patient awake    Reviewed: Allergy & Precautions, H&P , NPO status , Patient's Chart, lab work & pertinent test results  Airway Mallampati: II TM Distance: >3 FB Neck ROM: Full    Dental No notable dental hx.    Pulmonary neg pulmonary ROS, former smoker clear to auscultation  Pulmonary exam normal       Cardiovascular hypertension, Pt. on home beta blockers + CAD and neg cardio ROS Regular Normal    Neuro/Psych Negative Neurological ROS  Negative Psych ROS   GI/Hepatic negative GI ROS, Neg liver ROS, GERD-  ,  Endo/Other  Negative Endocrine ROS  Renal/GU negative Renal ROS  Genitourinary negative   Musculoskeletal negative musculoskeletal ROS (+)   Abdominal   Peds negative pediatric ROS (+)  Hematology negative hematology ROS (+)   Anesthesia Other Findings   Reproductive/Obstetrics negative OB ROS                          Anesthesia Physical Anesthesia Plan  ASA: III  Anesthesia Plan: General   Post-op Pain Management:    Induction: Intravenous  Airway Management Planned: LMA  Additional Equipment:   Intra-op Plan:   Post-operative Plan: Extubation in OR  Informed Consent: I have reviewed the patients History and Physical, chart, labs and discussed the procedure including the risks, benefits and alternatives for the proposed anesthesia with the patient or authorized representative who has indicated his/her understanding and acceptance.   Dental advisory given  Plan Discussed with: CRNA  Anesthesia Plan Comments:        Anesthesia Quick Evaluation

## 2011-01-16 NOTE — Progress Notes (Signed)
HGB. AND HCT. DRAWN BY LAB 

## 2011-01-16 NOTE — Brief Op Note (Signed)
01/16/2011  11:18 AM  PATIENT:  Brandon Fischer  78 y.o. male  PRE-OPERATIVE DIAGNOSIS:  Left Ureteropelvic Junction  POST-OPERATIVE DIAGNOSIS:  Left Ureteropelvic junction stone and left proximal ureteral stone.  PROCEDURE:  Procedure(s): LEFT NEPHROLITHOTOMY PERCUTANEOUS >2CM STONE LEFT ANTEGRADE URETEROSCOPY WITH LASERTRIPSY OF STONE.  SURGEON:  Surgeon(s): Anner Crete  PHYSICIAN ASSISTANT:   ASSISTANTS: none   ANESTHESIA:   general  EBL:  Total I/O In: 1000 [I.V.:1000] Out: 150 [Blood:150]  BLOOD ADMINISTERED:none  DRAINS: Urinary Catheter (Foley) and 24FR Ainsworth Nephrostomy tube and 41fr Kompy catheter   LOCAL MEDICATIONS USED:  NONE  SPECIMEN:  Source of Specimen:  stone fragments.  DISPOSITION OF SPECIMEN:  Given to family to bring to office for analysis.  COUNTS:  YES  TOURNIQUET:  * No tourniquets in log *  DICTATION: .Other Dictation: Dictation Number [id number]  PLAN OF CARE: Admit for overnight observation  PATIENT DISPOSITION:  PACU - hemodynamically stable.   Delay start of Pharmacological VTE agent (>24hrs) due to surgical blood loss or risk of bleeding:  {YES/NO/NOT APPLICABLE:20182

## 2011-01-16 NOTE — Interval H&P Note (Signed)
History and Physical Interval Note:  01/16/2011 8:30 AM  Brandon Fischer  has presented today for surgery, with the diagnosis of Left Ureteropelvic Junction  The various methods of treatment have been discussed with the patient and family. After consideration of risks, benefits and other options for treatment, the patient has consented to  Procedure(s): NEPHROLITHOTOMY PERCUTANEOUS as a surgical intervention .  The patients' history has been reviewed, patient examined, no change in status, stable for surgery.  I have reviewed the patients' chart and labs.  Questions were answered to the patient's satisfaction.     Kagan Mutchler J

## 2011-01-16 NOTE — Progress Notes (Signed)
Patient ID: Brandon Fischer, male   DOB: 01-07-1934, 78 y.o.   MRN: [id number]  He is doing well post left PCNL without pain.  His post op KUB shows no residual stone and the tubes in good position.  He has pink urine in the foley and NT tubes and the dressing is dry.  His Hgb is 10.7.  I will probably removed the tubes and send him home tomorrow if the urine clears.

## 2011-01-16 NOTE — H&P (Signed)
Brandon Fischer is an 78 y.o. male.   Chief Complaint: Renal calculi HPI: [name] with a history of renal calculi S/P placement of left percutaneous nephrostomy drain now with plans for placement of a left nephroureteral catheter placement. The patient is scheduled to go to the OR following this procedure for further treatment of his renal calculi.    Past Medical History  Diagnosis Date  . Hypertension   . Dyslipidemia   . Coronary disease     Nonobstructive  . Mild aortic stenosis     Very   . Renal calculi   . Palpitation     and Tachycardia  . GERD (gastroesophageal reflux disease)     NO MEDS--STATES MEDS DID NOT SEEM TO HELP IN THE PAST  . Coronary artery disease     NON-OBSTRUCTIVE CAD--DOES HAVE HX PALPITATIONS-AND -TACHYCARDIA-CHRONIC BETA BLOCKER THERAPY. HX OF MILD AORTIC STENOSIS   DR. PETER Fischer IS PT'S CARDIOLOGIST  . Nephrostomy status     PT HAD PLACEMENT OF NEPHROSTOMY TUBE-LEFT 01/10/11--TUBE TO BE REMOVED ON 01/16/11 AND NEPHRO-URETERAL CATHETER TO BE PLACED --IN INTERVENTIONAL RADIOLOGY-PRIOR TO PLANNED SURGERY LEFT PERCUTANEOUS NEPHROLITHOTOMY    Past Surgical History  Procedure Date  . Knee surgery     Status post left-ARTHROSCOPY  . Kidney stone surgery     Status post removal of 2  . Cholecystectomy     Status post  . Carpal tunnel release     BILATERAL    No family history on file. Social History:  reports that he has quit smoking. He has never used smokeless tobacco. He reports that he does not drink alcohol or use illicit drugs.  Allergies: No Known Allergies  Medications Prior to Admission  Medication Sig Dispense Refill  . aspirin 81 MG tablet Take 81 mg by mouth at bedtime.       [name] atorvastatin (LIPITOR) 40 MG tablet Take 40 mg by mouth every evening.       [name] HYDROcodone-acetaminophen (LORTAB) 10-500 MG per tablet Take 2 tablets by mouth every 4 (four) hours as needed. Pain       . metoprolol tartrate (LOPRESSOR) 25 MG tablet  Take 25 mg by mouth every evening.       . Naproxen Sodium (ALEVE PO) Take 440 mg by mouth 2 (two) times daily as needed. Pain       . ramipril (ALTACE) 2.5 MG capsule Take 2.5 mg by mouth every morning.       [name] DISCONTD: metoprolol tartrate (LOPRESSOR) 25 MG tablet Take 1 tablet (25 mg total) by mouth daily.  30 tablet  5   Medications Prior to Admission  Medication Dose Route Frequency Provider Last Rate Last Dose  . 0.9 %  sodium chloride infusion   Intravenous Continuous Brandon Leu, PA      . ciprofloxacin (CIPRO) IVPB 400 mg  400 mg Intravenous Once Brandon Leu, PA      . ciprofloxacin (CIPRO) IVPB 400 mg  400 mg Intravenous 60 min Pre-Op         Results for orders placed during the hospital encounter of 01/15/11 (from the past 48 hour(s))  SURGICAL PCR SCREEN     Status: Normal   Collection Time   01/15/11 10:30 AM      Component Value Range Comment   MRSA, PCR NEGATIVE  NEGATIVE     Staphylococcus aureus NEGATIVE  NEGATIVE    ABO/RH     Status: Normal  Collection Time   01/15/11 11:10 AM      Component Value Range Comment   ABO/RH(D) O POS     APTT     Status: Normal   Collection Time   01/15/11 11:15 AM      Component Value Range Comment   aPTT 33  24 - 37 (seconds)   BASIC METABOLIC PANEL     Status: Abnormal   Collection Time   01/15/11 11:15 AM      Component Value Range Comment   Sodium 142  135 - 145 (mEq/L)    Potassium 4.0  3.5 - 5.1 (mEq/L)    Chloride 106  96 - 112 (mEq/L)    CO2 27  19 - 32 (mEq/L)    Glucose, Bld 126 (*) 70 - 99 (mg/dL)    BUN 12  6 - 23 (mg/dL)    Creatinine, Ser 1.61  0.50 - 1.35 (mg/dL)    Calcium 8.9  8.4 - 10.5 (mg/dL)    GFR calc non Af Amer 65 (*) >90 (mL/min)    GFR calc Af Amer 76 (*) >90 (mL/min)   CBC     Status: Abnormal   Collection Time   01/15/11 11:15 AM      Component Value Range Comment   WBC 7.6  4.0 - 10.5 (K/uL)    RBC 4.05 (*) 4.22 - 5.81 (MIL/uL)    Hemoglobin 11.6 (*) 13.0 - 17.0 (g/dL)    HCT 09.6 (*) 04.5  - 52.0 (%)    MCV 83.0  78.0 - 100.0 (fL)    MCH 28.6  26.0 - 34.0 (pg)    MCHC 34.5  30.0 - 36.0 (g/dL)    RDW 40.9 (*) 81.1 - 15.5 (%)    Platelets 212  150 - 400 (K/uL)   PROTIME-INR     Status: Normal   Collection Time   01/15/11 11:15 AM      Component Value Range Comment   Prothrombin Time 13.7  11.6 - 15.2 (seconds)    INR 1.03  0.00 - 1.49    TYPE AND SCREEN     Status: Normal   Collection Time   01/16/11 12:00 AM      Component Value Range Comment   ABO/RH(D) O POS      Antibody Screen NEG      Sample Expiration 01/19/2011      Dg Chest 2 View  01/15/2011  *RADIOLOGY REPORT*  Clinical Data: Preop for left percutaneous nephrolithotomy. Nonobstructive coronary artery disease.  Hypertension.  Ex-smoker.  CHEST - 2 VIEW  Comparison: Report from 03/14/2010.  Prior images not available.  Findings: Moderate mid thoracic spondylosis. Midline trachea. Normal heart size and mediastinal contours. No pleural effusion or pneumothorax.  Clear lungs.  IMPRESSION: No acute cardiopulmonary disease.  Original Report Authenticated By: Brandon Fischer, M.D.    Review of Systems  Constitutional: Negative for fever and chills.  Respiratory: Negative for cough, sputum production and wheezing.   Cardiovascular: Negative for chest pain.  Endo/Heme/Allergies: Does not bruise/bleed easily.    There were no vitals taken for this visit. Physical Exam  Heent - unremarkable - Airway 2 Heart - RRR - distant Lungs - clear Abd - soft NT   Assessment/Plan Placement of a left nephroureteral catheter today by Dr Brandon Fischer followed by further treatment of the patient's renal calculi in the OR. Informed consent obtained.  Brandon Fischer 01/16/2011, 7:47 AM

## 2011-01-16 NOTE — Anesthesia Postprocedure Evaluation (Signed)
  Anesthesia Post-op Note  Patient: Brandon Fischer  Procedure(s) Performed:  NEPHROLITHOTOMY PERCUTANEOUS - C-Arm  Holmium Laser  Patient Location: PACU  Anesthesia Type: General  Level of Consciousness: awake and alert   Airway and Oxygen Therapy: Patient Spontanous Breathing  Post-op Pain: mild  Post-op Assessment: Post-op Vital signs reviewed, Patient's Cardiovascular Status Stable, Respiratory Function Stable, Patent Airway and No signs of Nausea or vomiting  Post-op Vital Signs: stable  Complications: No apparent anesthesia complications

## 2011-01-17 DIAGNOSIS — N200 Calculus of kidney: Secondary | ICD-10-CM | POA: Diagnosis present

## 2011-01-17 LAB — HEMOGLOBIN AND HEMATOCRIT, BLOOD
HCT: 29.7 % — ABNORMAL LOW (ref 39.0–52.0)
Hemoglobin: 10.4 g/dL — ABNORMAL LOW (ref 13.0–17.0)

## 2011-01-17 NOTE — Plan of Care (Signed)
Problem: Phase I Progression Outcomes Goal: OOB as tolerated unless otherwise ordered Outcome: Progressing BRP     

## 2011-01-17 NOTE — Op Note (Signed)
NAME:  Brandon Fischer, Brandon Fischer                   ACCOUNT NO.:  MEDICAL RECORD NO.:  [id number]  LOCATION:                                 FACILITY:  PHYSICIAN:  Excell Seltzer. Annabell Howells, M.D.    DATE OF BIRTH:  1933/05/24  DATE OF PROCEDURE:  01/16/2011 DATE OF DISCHARGE:                              OPERATIVE REPORT   Patient of Dr. Bjorn Pippin.  PROCEDURE:  Left percutaneous nephrolithotomy greater than 2 cm stone and left antegrade ureteroscopy with lithotripsy of a 6 mm stone.  PREOPERATIVE DIAGNOSIS:  Left ureteropelvic junction stone.  POSTOPERATIVE DIAGNOSIS:  Left ureteropelvic junction stone with left proximal ureteral stone.  SURGEON:  Excell Seltzer. Annabell Howells, MD  ANESTHESIA:  General.  SPECIMEN:  Stone fragments.  DRAINS: 1. Foley catheter. 2. 24-French Ainsworth nephrostomy tube. 3. 6-French Kompy catheter.  BLOOD LOSS:  100 cc.  COMPLICATIONS:  None.  INDICATIONS:  Mr. Havey is a 78 year old white male with a history of a long-standing left lower pole stone.  On his recent followup visit, he was found to have the stone migrate to the UPJ.  There were two 6 plus mm stones in the kidney as well.  It was felt that percutaneous nephrolithotomy was indicated because of the size of the stone and the apparent density on plain film.  Subsequent to his initial office visit, he developed the acute onset of pain and repeat imaging revealed the stone and it was felt that percutaneous nephrostomy tube placement was indicated prior to the scheduled date of surgery.  He underwent placement of a nephrostomy tube last week and at that time was found to have not only the UPJ stone of 2 cm, but 2 additional renal stones were noted in the ureters well one actually distal to the UPJ stone, so the combine stone length in the UPJ was approximately 2.5 cm and the remaining 6-8 mm stone was in the more distal proximal ureter.  FINDINGS AT PROCEDURE:  Earlier today, he underwent repositioning of  his nephrostomy tube, so a catheter was placed down the ureter.  He was taken to operating room where he was given Cipro.  He was fitted with PAS hose.  A general anesthetic was induced on the holding room stretcher and a Foley catheter was placed using sterile technique.  The patient was then rolled prone on chest rolls with great care to protect all pressure points.  The nephrostomy tube site was then prepped with Betadine solution.  He was draped in usual sterile fashion.  At this point, a guidewire was passed through the nephrostomy catheter to the bladder under fluoroscopic guidance.  The small nephrostomy tube was removed.  A 10-French peel-away sheath was placed over the wire and the inner core was removed.  An attempt to place a 2nd wire beyond the stone was unsuccessful, however, it was coiled adjacent to the stone at the UPJ.  At this point, the skin incision was enlarged and a Advertising account planner balloon was placed over the working wire and inflated to 18 atmospheres.  Once the balloon had fully inflated, the nephrostomy sheath was passed over the balloon and the balloon was removed.  The working wire was left in place through the sheath and the rigid nephroscope was then passed.  The stone was visualized in the UPJ, there was about a 6 mm stone that was adjacent to the large stone that was removed with a two-prong grasper.  I then engaged the larger stone with a percutaneous lithotrite and broke it into manageable fragments which were then removed with a two-prong grasper.  At this point, I was unable to advance the rigid nephroscope or the flexible nephroscope to the level of the proximal ureteral stone.  The working wire had to be removed because it obstructed the path to the ureteral stone.  The digital flexible ureteroscope was then utilized to access the stone in the proximal ureter.  The stone was then fragmented using a 200 micron laser fiber at 1.5 watts and 10 Hz.   Once the stone was adequately fragmented, multiple passes with the basket was performed to remove the residual fragments.  The stone was readily apparent on fluoroscopy and at the end of the stone retrieval, inspection with fluoroscopy revealed no residual fragments.  At this point, the rigid nephroscope was used to inspect the renal pelvis.  No obvious stone fragments were noted to remain.  I did insert a sensor wire through the rigid nephroscope down into the ureter to aid passage of the nephrostomy tube.  A 24-French Ainsworth nephrostomy tube was then passed through the sheath over the wire into the renal pelvis.  The balloon was initially inflated with 3 cc of fluid, but after nephrostogram was performed which revealed no extravasation,  1 cc was removed from the balloon.  At this point, the nephrostomy sheath was backed out and a 6-French Kompy safety catheter was placed over the safety wire into the distal ureter and the wire was removed.  The skin incision was closed around the nephrostomy tubes using 2-0 silk suture.  The safety catheter nephrostomy tubes were then secured in place with the suture.  At this point, the drapes were removed, and a compressive dressing was applied.  The safety catheter had been capped and a nephrostomy tube placed to straight drainage.  The patient was then rolled supine on the recovery room stretcher and his anesthetic was reversed.  He was moved to Recovery in stable condition.  There were no complications.     Excell Seltzer. Annabell Howells, M.D.     JJW/MEDQ  D:  01/16/2011  T:  01/17/2011  Job:  [id number]  cc:   Soyla Murphy. Renne Crigler, M.D. Fax: [phone]

## 2011-01-17 NOTE — Discharge Summary (Signed)
Physician Discharge Summary  Patient ID: IKTAN AIKMAN MRN: [id number] DOB/AGE: 76-Dec-1935 76 y.o.  Admit date: 01/16/2011 Discharge date: 01/17/2011  Admission Diagnoses: Left renal stone.  Discharge Diagnoses: Left renal stone Principal Problem:  *Nephrolithiasis   Discharged Condition: good  Hospital Course: Mr. Walston had an uneventful Left PCNL on 1/3.  His post op film was clear and his hgb was only minimally decreased.  His urine was clear on 1/4 and the foley and nephrostomy tubes were removed.  A fresh dressing was applied and he was felt to be ready for discharge home.  Consults: none  Significant Diagnostic Studies: Post op KUB shows no left renal stones and a small right renal stone.  Treatments: surgery: Left PCNL >2cm stone and antegrade ureteroscopy with laser.  Discharge Exam: Blood pressure 139/60, pulse 73, temperature 98 F (36.7 C), temperature source Oral, resp. rate 18, height 5\' 11"  (1.803 m), weight 95.6 kg (210 lb 12.2 oz), SpO2 97.00%. General appearance: alert and no distress GI: Wound site is free of bleeding.  The tubes were removed and a fresh dressing applied.  Disposition:   Discharge Orders    Future Orders Please Complete By Expires   Diet - low sodium heart healthy      Increase activity slowly      Change dressing (specify)      Comments:   Dressing change: Change dressing if it soaks through and replace with 4x4 gauze, abd pads and tape.   Once the draining stops, the wound can be covered with a large bandaid type dressing.   Discharge wound care:      Comments:   Keep wound dry until no drainage for 48 hours     Medication List  As of 01/17/2011  7:27 AM   START taking these medications         ciprofloxacin 500 MG tablet   Commonly known as: CIPRO   Take 1 tablet (500 mg total) by mouth 2 (two) times daily.      ondansetron 4 MG disintegrating tablet   Commonly known as: ZOFRAN-ODT   Take 1 tablet (4 mg total) by mouth every 8  (eight) hours as needed for nausea.      oxyCODONE-acetaminophen 5-325 MG per tablet   Commonly known as: PERCOCET   Take 1 tablet by mouth every 4 (four) hours as needed for pain.         CONTINUE taking these medications         ALEVE PO      aspirin 81 MG tablet      atorvastatin 40 MG tablet   Commonly known as: LIPITOR      HYDROcodone-acetaminophen 10-500 MG per tablet   Commonly known as: LORTAB      metoprolol tartrate 25 MG tablet   Commonly known as: LOPRESSOR      ramipril 2.5 MG capsule   Commonly known as: ALTACE          Where to get your medications    These are the prescriptions that you need to pick up.   You may get these medications from any pharmacy.         ciprofloxacin 500 MG tablet   ondansetron 4 MG disintegrating tablet   oxyCODONE-acetaminophen 5-325 MG per tablet           Follow-up Information    Follow up with [facility], NP on 01/24/2011. (1:15)    Contact information:   [address]  [facility] [place] [address] [phone]          Signed: Anner Crete 01/17/2011, 7:27 AM

## 2011-01-17 NOTE — Plan of Care (Deleted)
Problem: Phase I Progression Outcomes Goal: OOB as tolerated unless otherwise ordered Outcome: Completed/Met Date Met:  01/17/11 Ambulated in halls

## 2011-01-20 ENCOUNTER — Encounter (HOSPITAL_COMMUNITY): Payer: Self-pay | Admitting: Urology

## 2011-01-24 DIAGNOSIS — N200 Calculus of kidney: Secondary | ICD-10-CM | POA: Diagnosis not present

## 2011-02-03 DIAGNOSIS — I10 Essential (primary) hypertension: Secondary | ICD-10-CM | POA: Diagnosis not present

## 2011-02-03 DIAGNOSIS — E7800 Pure hypercholesterolemia, unspecified: Secondary | ICD-10-CM | POA: Diagnosis not present

## 2011-02-03 DIAGNOSIS — F520 Hypoactive sexual desire disorder: Secondary | ICD-10-CM | POA: Diagnosis not present

## 2011-02-03 DIAGNOSIS — Z7902 Long term (current) use of antithrombotics/antiplatelets: Secondary | ICD-10-CM | POA: Diagnosis not present

## 2011-02-03 DIAGNOSIS — Z125 Encounter for screening for malignant neoplasm of prostate: Secondary | ICD-10-CM | POA: Diagnosis not present

## 2011-02-06 DIAGNOSIS — I10 Essential (primary) hypertension: Secondary | ICD-10-CM | POA: Diagnosis not present

## 2011-02-06 DIAGNOSIS — B353 Tinea pedis: Secondary | ICD-10-CM | POA: Diagnosis not present

## 2011-02-06 DIAGNOSIS — B351 Tinea unguium: Secondary | ICD-10-CM | POA: Diagnosis not present

## 2011-02-06 DIAGNOSIS — Z0000 Encounter for general adult medical examination without abnormal findings: Secondary | ICD-10-CM | POA: Diagnosis not present

## 2011-02-06 DIAGNOSIS — D649 Anemia, unspecified: Secondary | ICD-10-CM | POA: Diagnosis not present

## 2011-02-12 DIAGNOSIS — R0602 Shortness of breath: Secondary | ICD-10-CM | POA: Diagnosis not present

## 2011-02-24 DIAGNOSIS — N200 Calculus of kidney: Secondary | ICD-10-CM | POA: Diagnosis not present

## 2011-02-26 DIAGNOSIS — Z1212 Encounter for screening for malignant neoplasm of rectum: Secondary | ICD-10-CM | POA: Diagnosis not present

## 2011-03-11 DIAGNOSIS — D649 Anemia, unspecified: Secondary | ICD-10-CM | POA: Diagnosis not present

## 2011-03-11 DIAGNOSIS — I10 Essential (primary) hypertension: Secondary | ICD-10-CM | POA: Diagnosis not present

## 2011-03-11 DIAGNOSIS — K219 Gastro-esophageal reflux disease without esophagitis: Secondary | ICD-10-CM | POA: Diagnosis not present

## 2011-03-19 DIAGNOSIS — I059 Rheumatic mitral valve disease, unspecified: Secondary | ICD-10-CM | POA: Diagnosis not present

## 2011-04-22 DIAGNOSIS — L408 Other psoriasis: Secondary | ICD-10-CM | POA: Diagnosis not present

## 2011-04-22 DIAGNOSIS — L723 Sebaceous cyst: Secondary | ICD-10-CM | POA: Diagnosis not present

## 2011-04-23 DIAGNOSIS — N529 Male erectile dysfunction, unspecified: Secondary | ICD-10-CM | POA: Diagnosis not present

## 2011-04-23 DIAGNOSIS — N200 Calculus of kidney: Secondary | ICD-10-CM | POA: Diagnosis not present

## 2011-10-29 DIAGNOSIS — N200 Calculus of kidney: Secondary | ICD-10-CM | POA: Diagnosis not present

## 2011-10-29 DIAGNOSIS — N529 Male erectile dysfunction, unspecified: Secondary | ICD-10-CM | POA: Diagnosis not present

## 2011-11-07 DIAGNOSIS — Z23 Encounter for immunization: Secondary | ICD-10-CM | POA: Diagnosis not present

## 2011-11-21 ENCOUNTER — Encounter: Payer: Self-pay | Admitting: Cardiology

## 2011-11-21 ENCOUNTER — Ambulatory Visit (INDEPENDENT_AMBULATORY_CARE_PROVIDER_SITE_OTHER): Payer: Medicare Other | Admitting: Cardiology

## 2011-11-21 VITALS — BP 160/78 | HR 63 | Ht 71.0 in | Wt 194.8 lb

## 2011-11-21 DIAGNOSIS — R002 Palpitations: Secondary | ICD-10-CM

## 2011-11-21 DIAGNOSIS — I10 Essential (primary) hypertension: Secondary | ICD-10-CM

## 2011-11-21 NOTE — Progress Notes (Signed)
Brandon Fischer Date of Birth: Jan 25, 1933 Medical Record #[id number]  History of Present Illness: Brandon Fischer is seen today for yearly followup. He has a history of palpitations and has been on chronic beta blocker therapy. He's never had any documented arrhythmia. He reports he has done very well this past year. He was admitted in January with a kidney stone and had to have surgical removal. He reports his blood pressure at home has been doing well typically at 130 systolic. He has lost 19 pounds over the past year. He does complain of some erectile dysfunction for which Cialis or Viagra has not helped.  Current Outpatient Prescriptions on File Prior to Visit  Medication Sig Dispense Refill  . aspirin 81 MG tablet Take 81 mg by mouth at bedtime.       [name] atorvastatin (LIPITOR) 40 MG tablet Take 40 mg by mouth every evening.       . metoprolol tartrate (LOPRESSOR) 25 MG tablet Take 25 mg by mouth every evening.       . Naproxen Sodium (ALEVE PO) Take 440 mg by mouth 2 (two) times daily as needed. Pain       . ramipril (ALTACE) 2.5 MG capsule Take 2.5 mg by mouth every morning.         No Known Allergies  Past Medical History  Diagnosis Date  . Hypertension   . Dyslipidemia   . Coronary disease     Nonobstructive  . Mild aortic stenosis     Very   . Renal calculi   . Palpitation     and Tachycardia  . GERD (gastroesophageal reflux disease)     NO MEDS--STATES MEDS DID NOT SEEM TO HELP IN THE PAST  . Coronary artery disease     NON-OBSTRUCTIVE CAD--DOES HAVE HX PALPITATIONS-AND -TACHYCARDIA-CHRONIC BETA BLOCKER THERAPY. HX OF MILD AORTIC STENOSIS   DR. Heyward Douthit [place] IS PT'S CARDIOLOGIST  . Nephrostomy status     PT HAD PLACEMENT OF NEPHROSTOMY TUBE-LEFT 01/10/11--TUBE TO BE REMOVED ON 01/16/11 AND NEPHRO-URETERAL CATHETER TO BE PLACED --IN INTERVENTIONAL RADIOLOGY-PRIOR TO PLANNED SURGERY LEFT PERCUTANEOUS NEPHROLITHOTOMY    Past Surgical History  Procedure Date  . Knee surgery       Status post left-ARTHROSCOPY  . Kidney stone surgery     Status post removal of 2  . Cholecystectomy     Status post  . Carpal tunnel release     BILATERAL  . Nephrolithotomy 01/16/2011    Procedure: NEPHROLITHOTOMY PERCUTANEOUS;  Surgeon: Anner Crete;  Location: [facility];  Service: Urology;  Laterality: Left;  C-Arm  Holmium Laser    History  Smoking status  . Former Smoker  Smokeless tobacco  . Never Used    Comment: QUIT SMOKING 13 YRS AGO    History  Alcohol Use No    History reviewed. No pertinent family history.  Review of Systems: As noted in history of present illness.  All other systems were reviewed and are negative.  Physical Exam: BP 160/78  Pulse 63  Ht 5\' 11"  (1.803 m)  Wt 194 lb 12.8 oz (88.361 kg)  BMI 27.17 kg/m2  SpO2 99% The patient is alert and oriented x 3.   are normal.  The skin is warm and dry.   The HEENT exam is normal. He does wear a hearing aid. The carotids are 2+ without bruits.  There is no thyromegaly.  There is no JVD.  The lungs are clear.    The heart exam  reveals a regular rate with a normal S1 and S2.  There are no murmurs, gallops, or rubs.  The PMI is not displaced.   Abdominal exam reveals good bowel sounds.   There are no masses.  Exam of the legs reveal no clubbing, cyanosis, or edema.  The legs are without rashes.  The distal pulses are intact.  Cranial nerves II - XII are intact.  Motor and sensory functions are intact.  The gait is normal.  LABORATORY DATA:   Assessment / Plan: 1. Palpitations, well controlled on low-dose metoprolol.  2. Hypertension blood pressures are elevated today. He states he had barbecue he last night. He will monitor his blood pressure at home and if it remains elevated I would increase his Altace dose. Otherwise I'll see him again in one year.

## 2011-11-21 NOTE — Patient Instructions (Signed)
Continue your current medication  Keep an eye on your blood pressure - if it stays high let me know.  I will see you in one year.

## 2011-12-19 DIAGNOSIS — H2510 Age-related nuclear cataract, unspecified eye: Secondary | ICD-10-CM | POA: Diagnosis not present

## 2012-02-17 DIAGNOSIS — Z125 Encounter for screening for malignant neoplasm of prostate: Secondary | ICD-10-CM | POA: Diagnosis not present

## 2012-02-17 DIAGNOSIS — Z0000 Encounter for general adult medical examination without abnormal findings: Secondary | ICD-10-CM | POA: Diagnosis not present

## 2012-02-17 DIAGNOSIS — E7800 Pure hypercholesterolemia, unspecified: Secondary | ICD-10-CM | POA: Diagnosis not present

## 2012-02-17 DIAGNOSIS — I10 Essential (primary) hypertension: Secondary | ICD-10-CM | POA: Diagnosis not present

## 2012-02-25 DIAGNOSIS — R002 Palpitations: Secondary | ICD-10-CM | POA: Diagnosis not present

## 2012-02-25 DIAGNOSIS — E7800 Pure hypercholesterolemia, unspecified: Secondary | ICD-10-CM | POA: Diagnosis not present

## 2012-02-25 DIAGNOSIS — Z1212 Encounter for screening for malignant neoplasm of rectum: Secondary | ICD-10-CM | POA: Diagnosis not present

## 2012-02-25 DIAGNOSIS — I10 Essential (primary) hypertension: Secondary | ICD-10-CM | POA: Diagnosis not present

## 2012-02-25 DIAGNOSIS — R748 Abnormal levels of other serum enzymes: Secondary | ICD-10-CM | POA: Diagnosis not present

## 2012-02-25 DIAGNOSIS — I2510 Atherosclerotic heart disease of native coronary artery without angina pectoris: Secondary | ICD-10-CM | POA: Diagnosis not present

## 2012-03-12 DIAGNOSIS — Z1212 Encounter for screening for malignant neoplasm of rectum: Secondary | ICD-10-CM | POA: Diagnosis not present

## 2012-04-06 DIAGNOSIS — I10 Essential (primary) hypertension: Secondary | ICD-10-CM | POA: Diagnosis not present

## 2012-04-06 DIAGNOSIS — I2510 Atherosclerotic heart disease of native coronary artery without angina pectoris: Secondary | ICD-10-CM | POA: Diagnosis not present

## 2012-04-06 DIAGNOSIS — R0989 Other specified symptoms and signs involving the circulatory and respiratory systems: Secondary | ICD-10-CM | POA: Diagnosis not present

## 2012-04-06 DIAGNOSIS — R002 Palpitations: Secondary | ICD-10-CM | POA: Diagnosis not present

## 2012-04-22 DIAGNOSIS — I2510 Atherosclerotic heart disease of native coronary artery without angina pectoris: Secondary | ICD-10-CM | POA: Diagnosis not present

## 2012-04-22 DIAGNOSIS — R748 Abnormal levels of other serum enzymes: Secondary | ICD-10-CM | POA: Diagnosis not present

## 2012-04-22 DIAGNOSIS — I10 Essential (primary) hypertension: Secondary | ICD-10-CM | POA: Diagnosis not present

## 2012-04-22 DIAGNOSIS — M76899 Other specified enthesopathies of unspecified lower limb, excluding foot: Secondary | ICD-10-CM | POA: Diagnosis not present

## 2012-04-22 DIAGNOSIS — L570 Actinic keratosis: Secondary | ICD-10-CM | POA: Diagnosis not present

## 2012-04-28 ENCOUNTER — Other Ambulatory Visit: Payer: Self-pay | Admitting: Internal Medicine

## 2012-04-28 DIAGNOSIS — M76899 Other specified enthesopathies of unspecified lower limb, excluding foot: Secondary | ICD-10-CM | POA: Diagnosis not present

## 2012-04-28 DIAGNOSIS — M25559 Pain in unspecified hip: Secondary | ICD-10-CM | POA: Diagnosis not present

## 2012-04-28 DIAGNOSIS — R748 Abnormal levels of other serum enzymes: Secondary | ICD-10-CM

## 2012-04-30 ENCOUNTER — Ambulatory Visit
Admission: RE | Admit: 2012-04-30 | Discharge: 2012-04-30 | Disposition: A | Discharge status: Home / Self Care | Payer: No Typology Code available for payment source | Source: Ambulatory Visit | Attending: Internal Medicine | Admitting: Internal Medicine

## 2012-04-30 DIAGNOSIS — I714 Abdominal aortic aneurysm, without rupture: Secondary | ICD-10-CM | POA: Diagnosis not present

## 2012-04-30 DIAGNOSIS — K7689 Other specified diseases of liver: Secondary | ICD-10-CM | POA: Diagnosis not present

## 2012-04-30 DIAGNOSIS — R748 Abnormal levels of other serum enzymes: Secondary | ICD-10-CM

## 2012-04-30 DIAGNOSIS — R161 Splenomegaly, not elsewhere classified: Secondary | ICD-10-CM | POA: Diagnosis not present

## 2012-05-03 DIAGNOSIS — R0989 Other specified symptoms and signs involving the circulatory and respiratory systems: Secondary | ICD-10-CM | POA: Diagnosis not present

## 2012-06-23 DIAGNOSIS — R748 Abnormal levels of other serum enzymes: Secondary | ICD-10-CM | POA: Diagnosis not present

## 2012-07-01 ENCOUNTER — Other Ambulatory Visit: Payer: Self-pay | Admitting: Gastroenterology

## 2012-07-01 DIAGNOSIS — R748 Abnormal levels of other serum enzymes: Secondary | ICD-10-CM | POA: Diagnosis not present

## 2012-07-02 ENCOUNTER — Ambulatory Visit
Admission: RE | Admit: 2012-07-02 | Discharge: 2012-07-02 | Disposition: A | Discharge status: Home / Self Care | Payer: Medicare Other | Source: Ambulatory Visit | Attending: Gastroenterology | Admitting: Gastroenterology

## 2012-07-02 DIAGNOSIS — N281 Cyst of kidney, acquired: Secondary | ICD-10-CM | POA: Diagnosis not present

## 2012-07-05 ENCOUNTER — Other Ambulatory Visit: Payer: Self-pay | Admitting: Surgery

## 2012-07-05 DIAGNOSIS — L408 Other psoriasis: Secondary | ICD-10-CM | POA: Diagnosis not present

## 2012-07-05 DIAGNOSIS — C4330 Malignant melanoma of unspecified part of face: Secondary | ICD-10-CM | POA: Diagnosis not present

## 2012-07-26 DIAGNOSIS — C4330 Malignant melanoma of unspecified part of face: Secondary | ICD-10-CM | POA: Diagnosis not present

## 2012-07-29 DIAGNOSIS — R748 Abnormal levels of other serum enzymes: Secondary | ICD-10-CM | POA: Diagnosis not present

## 2012-08-06 DIAGNOSIS — M76899 Other specified enthesopathies of unspecified lower limb, excluding foot: Secondary | ICD-10-CM | POA: Diagnosis not present

## 2012-08-06 DIAGNOSIS — M5137 Other intervertebral disc degeneration, lumbosacral region: Secondary | ICD-10-CM | POA: Diagnosis not present

## 2012-08-09 DIAGNOSIS — C4330 Malignant melanoma of unspecified part of face: Secondary | ICD-10-CM | POA: Diagnosis not present

## 2012-08-10 DIAGNOSIS — C4330 Malignant melanoma of unspecified part of face: Secondary | ICD-10-CM | POA: Diagnosis not present

## 2012-09-24 DIAGNOSIS — L570 Actinic keratosis: Secondary | ICD-10-CM | POA: Diagnosis not present

## 2012-09-24 DIAGNOSIS — L408 Other psoriasis: Secondary | ICD-10-CM | POA: Diagnosis not present

## 2012-09-24 DIAGNOSIS — Z85820 Personal history of malignant melanoma of skin: Secondary | ICD-10-CM | POA: Diagnosis not present

## 2012-09-27 DIAGNOSIS — Z23 Encounter for immunization: Secondary | ICD-10-CM | POA: Diagnosis not present

## 2012-10-13 DIAGNOSIS — I6529 Occlusion and stenosis of unspecified carotid artery: Secondary | ICD-10-CM | POA: Diagnosis not present

## 2012-10-13 DIAGNOSIS — I10 Essential (primary) hypertension: Secondary | ICD-10-CM | POA: Diagnosis not present

## 2012-10-13 DIAGNOSIS — I471 Supraventricular tachycardia: Secondary | ICD-10-CM | POA: Diagnosis not present

## 2012-10-13 DIAGNOSIS — I2510 Atherosclerotic heart disease of native coronary artery without angina pectoris: Secondary | ICD-10-CM | POA: Diagnosis not present

## 2012-11-04 DIAGNOSIS — I6529 Occlusion and stenosis of unspecified carotid artery: Secondary | ICD-10-CM | POA: Diagnosis not present

## 2012-11-17 DIAGNOSIS — M549 Dorsalgia, unspecified: Secondary | ICD-10-CM | POA: Diagnosis not present

## 2012-11-17 DIAGNOSIS — G8929 Other chronic pain: Secondary | ICD-10-CM | POA: Diagnosis not present

## 2012-11-17 DIAGNOSIS — M25559 Pain in unspecified hip: Secondary | ICD-10-CM | POA: Diagnosis not present

## 2012-12-15 DIAGNOSIS — N200 Calculus of kidney: Secondary | ICD-10-CM | POA: Diagnosis not present

## 2012-12-15 DIAGNOSIS — N529 Male erectile dysfunction, unspecified: Secondary | ICD-10-CM | POA: Diagnosis not present

## 2013-02-23 DIAGNOSIS — Z23 Encounter for immunization: Secondary | ICD-10-CM | POA: Diagnosis not present

## 2013-02-23 DIAGNOSIS — Z0000 Encounter for general adult medical examination without abnormal findings: Secondary | ICD-10-CM | POA: Diagnosis not present

## 2013-02-23 DIAGNOSIS — Z125 Encounter for screening for malignant neoplasm of prostate: Secondary | ICD-10-CM | POA: Diagnosis not present

## 2013-02-23 DIAGNOSIS — E7800 Pure hypercholesterolemia, unspecified: Secondary | ICD-10-CM | POA: Diagnosis not present

## 2013-02-23 DIAGNOSIS — I10 Essential (primary) hypertension: Secondary | ICD-10-CM | POA: Diagnosis not present

## 2013-02-23 DIAGNOSIS — Z791 Long term (current) use of non-steroidal anti-inflammatories (NSAID): Secondary | ICD-10-CM | POA: Diagnosis not present

## 2013-03-02 DIAGNOSIS — N400 Enlarged prostate without lower urinary tract symptoms: Secondary | ICD-10-CM | POA: Diagnosis not present

## 2013-03-02 DIAGNOSIS — I2510 Atherosclerotic heart disease of native coronary artery without angina pectoris: Secondary | ICD-10-CM | POA: Diagnosis not present

## 2013-03-02 DIAGNOSIS — E7800 Pure hypercholesterolemia, unspecified: Secondary | ICD-10-CM | POA: Diagnosis not present

## 2013-03-02 DIAGNOSIS — D649 Anemia, unspecified: Secondary | ICD-10-CM | POA: Diagnosis not present

## 2013-03-02 DIAGNOSIS — M76899 Other specified enthesopathies of unspecified lower limb, excluding foot: Secondary | ICD-10-CM | POA: Diagnosis not present

## 2013-03-25 DIAGNOSIS — L821 Other seborrheic keratosis: Secondary | ICD-10-CM | POA: Diagnosis not present

## 2013-03-25 DIAGNOSIS — L819 Disorder of pigmentation, unspecified: Secondary | ICD-10-CM | POA: Diagnosis not present

## 2013-03-25 DIAGNOSIS — D235 Other benign neoplasm of skin of trunk: Secondary | ICD-10-CM | POA: Diagnosis not present

## 2013-03-25 DIAGNOSIS — Z85820 Personal history of malignant melanoma of skin: Secondary | ICD-10-CM | POA: Diagnosis not present

## 2013-04-20 DIAGNOSIS — N200 Calculus of kidney: Secondary | ICD-10-CM | POA: Diagnosis not present

## 2013-04-20 DIAGNOSIS — N201 Calculus of ureter: Secondary | ICD-10-CM | POA: Diagnosis not present

## 2013-05-02 DIAGNOSIS — I6529 Occlusion and stenosis of unspecified carotid artery: Secondary | ICD-10-CM | POA: Diagnosis not present

## 2013-05-12 DIAGNOSIS — I471 Supraventricular tachycardia: Secondary | ICD-10-CM | POA: Diagnosis not present

## 2013-05-12 DIAGNOSIS — I2510 Atherosclerotic heart disease of native coronary artery without angina pectoris: Secondary | ICD-10-CM | POA: Diagnosis not present

## 2013-05-12 DIAGNOSIS — I658 Occlusion and stenosis of other precerebral arteries: Secondary | ICD-10-CM | POA: Diagnosis not present

## 2013-05-12 DIAGNOSIS — I6529 Occlusion and stenosis of unspecified carotid artery: Secondary | ICD-10-CM | POA: Diagnosis not present

## 2013-05-23 DIAGNOSIS — N201 Calculus of ureter: Secondary | ICD-10-CM | POA: Diagnosis not present

## 2013-06-10 ENCOUNTER — Other Ambulatory Visit: Payer: Self-pay | Admitting: Gastroenterology

## 2013-06-10 DIAGNOSIS — Z09 Encounter for follow-up examination after completed treatment for conditions other than malignant neoplasm: Secondary | ICD-10-CM | POA: Diagnosis not present

## 2013-06-10 DIAGNOSIS — Z86010 Personal history of colonic polyps: Secondary | ICD-10-CM | POA: Diagnosis not present

## 2013-06-10 DIAGNOSIS — D126 Benign neoplasm of colon, unspecified: Secondary | ICD-10-CM | POA: Diagnosis not present

## 2013-06-10 DIAGNOSIS — K648 Other hemorrhoids: Secondary | ICD-10-CM | POA: Diagnosis not present

## 2013-07-07 DIAGNOSIS — I10 Essential (primary) hypertension: Secondary | ICD-10-CM | POA: Diagnosis not present

## 2013-07-07 DIAGNOSIS — I2510 Atherosclerotic heart disease of native coronary artery without angina pectoris: Secondary | ICD-10-CM | POA: Diagnosis not present

## 2013-07-07 DIAGNOSIS — R0602 Shortness of breath: Secondary | ICD-10-CM | POA: Diagnosis not present

## 2013-07-07 DIAGNOSIS — I6529 Occlusion and stenosis of unspecified carotid artery: Secondary | ICD-10-CM | POA: Diagnosis not present

## 2013-07-22 DIAGNOSIS — D485 Neoplasm of uncertain behavior of skin: Secondary | ICD-10-CM | POA: Diagnosis not present

## 2013-08-25 DIAGNOSIS — E7800 Pure hypercholesterolemia, unspecified: Secondary | ICD-10-CM | POA: Diagnosis not present

## 2013-08-30 DIAGNOSIS — J387 Other diseases of larynx: Secondary | ICD-10-CM | POA: Diagnosis not present

## 2013-08-30 DIAGNOSIS — E7800 Pure hypercholesterolemia, unspecified: Secondary | ICD-10-CM | POA: Diagnosis not present

## 2013-08-30 DIAGNOSIS — I2510 Atherosclerotic heart disease of native coronary artery without angina pectoris: Secondary | ICD-10-CM | POA: Diagnosis not present

## 2013-09-07 DIAGNOSIS — L570 Actinic keratosis: Secondary | ICD-10-CM | POA: Diagnosis not present

## 2013-09-07 DIAGNOSIS — D235 Other benign neoplasm of skin of trunk: Secondary | ICD-10-CM | POA: Diagnosis not present

## 2013-09-07 DIAGNOSIS — L821 Other seborrheic keratosis: Secondary | ICD-10-CM | POA: Diagnosis not present

## 2013-09-07 DIAGNOSIS — L819 Disorder of pigmentation, unspecified: Secondary | ICD-10-CM | POA: Diagnosis not present

## 2013-11-01 DIAGNOSIS — I6523 Occlusion and stenosis of bilateral carotid arteries: Secondary | ICD-10-CM | POA: Diagnosis not present

## 2013-12-21 DIAGNOSIS — N201 Calculus of ureter: Secondary | ICD-10-CM | POA: Diagnosis not present

## 2013-12-21 DIAGNOSIS — N200 Calculus of kidney: Secondary | ICD-10-CM | POA: Diagnosis not present

## 2014-01-12 DIAGNOSIS — H5203 Hypermetropia, bilateral: Secondary | ICD-10-CM | POA: Diagnosis not present

## 2014-01-12 DIAGNOSIS — H2513 Age-related nuclear cataract, bilateral: Secondary | ICD-10-CM | POA: Diagnosis not present

## 2014-01-20 DIAGNOSIS — I2510 Atherosclerotic heart disease of native coronary artery without angina pectoris: Secondary | ICD-10-CM | POA: Diagnosis not present

## 2014-01-20 DIAGNOSIS — I471 Supraventricular tachycardia: Secondary | ICD-10-CM | POA: Diagnosis not present

## 2014-01-20 DIAGNOSIS — R002 Palpitations: Secondary | ICD-10-CM | POA: Diagnosis not present

## 2014-01-20 DIAGNOSIS — I6522 Occlusion and stenosis of left carotid artery: Secondary | ICD-10-CM | POA: Diagnosis not present

## 2014-01-24 DIAGNOSIS — N201 Calculus of ureter: Secondary | ICD-10-CM | POA: Diagnosis not present

## 2014-01-24 DIAGNOSIS — N281 Cyst of kidney, acquired: Secondary | ICD-10-CM | POA: Diagnosis not present

## 2014-01-24 DIAGNOSIS — R800 Isolated proteinuria: Secondary | ICD-10-CM | POA: Diagnosis not present

## 2014-01-24 DIAGNOSIS — N200 Calculus of kidney: Secondary | ICD-10-CM | POA: Diagnosis not present

## 2014-02-01 ENCOUNTER — Other Ambulatory Visit: Payer: Self-pay | Admitting: Urology

## 2014-02-06 ENCOUNTER — Encounter (HOSPITAL_BASED_OUTPATIENT_CLINIC_OR_DEPARTMENT_OTHER): Payer: Self-pay | Admitting: *Deleted

## 2014-02-06 NOTE — Progress Notes (Signed)
   02/06/14 1437  OBSTRUCTIVE SLEEP APNEA  Have you ever been diagnosed with sleep apnea through a sleep study? No  Do you snore loudly (loud enough to be heard through closed doors)?  1  Do you often feel tired, fatigued, or sleepy during the daytime? 0  Has anyone observed you stop breathing during your sleep? 0  Do you have, or are you being treated for high blood pressure? 1  BMI more than 35 kg/m2? 0  Age over 79 years old? 1  Gender: 1  Obstructive Sleep Apnea Score 4  Score 4 or greater  Results sent to PCP

## 2014-02-06 NOTE — Progress Notes (Signed)
NPO AFTER MN. ARRIVE AT 0830. NEEDS ISTAT.  CURRENT EKG AND LOV NOTE TO BE FAXED FROM DR Einar Gip. WILL TAKE ZANTAC AM DOS W/ SIPS OF WATER.

## 2014-02-08 NOTE — H&P (Signed)
  Active Problems Problems  1. Calculus of ureter (N20.1)   Assessed By: Jimmey Ralph (Urology); Last Assessed: 24 Jan 2014 2. Isolated proteinuria (R80.0)   Assessed By: Jimmey Ralph (Urology); Last Assessed: 24 Jan 2014 3. Kidney stone on right side (N20.0)   Assessed By: Jimmey Ralph (Urology); Last Assessed: 24 Jan 2014 4. Renal cyst, acquired, right (N28.1)   Assessed By: Jimmey Ralph (Urology); Last Assessed: 24 Jan 2014  History of Present Illness 81 YO male patient of Dr. Ralene Muskrat seen today for a 1 month f/u.     GU Hx:  Hx of stones and had a left PCNL on 01/16/11. He was seen in the spring with a left UVJ stone which he subsequently passed. He has had no recent symptoms but the KUB shows a 75mm right distal stone that was previously in the RLP.  He has no associated signs or symptoms.     Jan 2016 Interval Hx:   Today states he continues to be pain free but has passed a small stone over the weekend. Denies hematuria or dysuria.l   Vitals Vital Signs [Data Includes: Last 1 Day]  Recorded: 12Jan2016 10:49AM  Blood Pressure: 124 / 67 Temperature: 97.6 F Heart Rate: 58  Physical Exam Constitutional: Well nourished and well developed . No acute distress. The patient appears well hydrated.  Abdomen: The abdomen is mildly obese. The abdomen is soft and nontender. No suprapubic tenderness. No CVA tenderness.    Results/Data Urine [Data Includes: Last 1 Day]   [id number]  COLOR AMBER   APPEARANCE CLEAR   SPECIFIC GRAVITY 1.025   pH 5.5   GLUCOSE NEG mg/dL  BILIRUBIN SMALL   KETONE NEG mg/dL  BLOOD TRACE   PROTEIN 30 mg/dL  UROBILINOGEN 4 mg/dL  NITRITE NEG   LEUKOCYTE ESTERASE NEG   SQUAMOUS EPITHELIAL/HPF RARE   WBC 0-2 WBC/hpf  RBC 0-2 RBC/hpf  BACTERIA NONE SEEN   CRYSTALS NONE SEEN   CASTS NONE SEEN    The following images/tracing/specimen were independently visualized:  RUS: (limited): right- 11.14 X 1.46 X 4.63 X 4.87 cm. Minimal fullness upper  pole. Lower pole cysts. Lower pole stone: 0.91 cm. KUB: shows stable right lower pole stone. There is no change in distal R UVJ calculus but can visualize small stone w/in bladder. KUB and RUS were both reviewed by Dr. Jeffie Pollock.  The following clinical lab reports were reviewed:  UA- shows no microscopic hematuria present. Trace protein 30 mg but this has been noted once in 2012.  PVR: Ultrasound PVR 14.20. 2 echogenic foci seen in the bladder. ml.    Assessment Assessed  1. Isolated proteinuria (R80.0) 2. Renal cyst, acquired, right (N28.1) 3. Kidney stone on right side (N20.0) 4. Calculus of ureter (N20.1)  Plan At this time Dr. Jeffie Pollock recommends proceeding with either elective ESWL or cystourethroscopy, R RPG, stone extraction. Risks and benefits of both discussed which include IV sedation vs general, possible need for second procedure post ESWL if stone does not fragment or possible need for cystourethroscopy and stent placement. Risk with cysto is possible hematuria, infection, general anesthesia, and possible need for stent. He wishes to proceed with cystourethroscopy, R RPG, stone extraction.

## 2014-02-09 ENCOUNTER — Encounter (HOSPITAL_BASED_OUTPATIENT_CLINIC_OR_DEPARTMENT_OTHER): Admission: RE | Payer: Self-pay | Source: Ambulatory Visit | Attending: Urology

## 2014-02-09 ENCOUNTER — Ambulatory Visit (HOSPITAL_BASED_OUTPATIENT_CLINIC_OR_DEPARTMENT_OTHER)
Admission: RE | Admit: 2014-02-09 | Discharge: 2014-02-09 | Disposition: A | Discharge status: Other Acute Inpatient Hospital | Payer: Medicare Other | Source: Ambulatory Visit | Attending: Urology | Admitting: Urology

## 2014-02-09 ENCOUNTER — Other Ambulatory Visit: Payer: Self-pay

## 2014-02-09 ENCOUNTER — Ambulatory Visit (HOSPITAL_BASED_OUTPATIENT_CLINIC_OR_DEPARTMENT_OTHER): Payer: Medicare Other | Admitting: Anesthesiology

## 2014-02-09 ENCOUNTER — Encounter (HOSPITAL_BASED_OUTPATIENT_CLINIC_OR_DEPARTMENT_OTHER): Payer: Self-pay | Admitting: *Deleted

## 2014-02-09 ENCOUNTER — Encounter (HOSPITAL_COMMUNITY): Payer: Self-pay | Admitting: Emergency Medicine

## 2014-02-09 ENCOUNTER — Emergency Department (HOSPITAL_COMMUNITY)
Admission: EM | Admit: 2014-02-09 | Discharge: 2014-02-09 | Disposition: A | Discharge status: Home / Self Care | Payer: Medicare Other | Attending: Emergency Medicine | Admitting: Emergency Medicine

## 2014-02-09 DIAGNOSIS — Z974 Presence of external hearing-aid: Secondary | ICD-10-CM | POA: Insufficient documentation

## 2014-02-09 DIAGNOSIS — Z872 Personal history of diseases of the skin and subcutaneous tissue: Secondary | ICD-10-CM | POA: Diagnosis not present

## 2014-02-09 DIAGNOSIS — N281 Cyst of kidney, acquired: Secondary | ICD-10-CM | POA: Insufficient documentation

## 2014-02-09 DIAGNOSIS — E785 Hyperlipidemia, unspecified: Secondary | ICD-10-CM | POA: Insufficient documentation

## 2014-02-09 DIAGNOSIS — I10 Essential (primary) hypertension: Secondary | ICD-10-CM | POA: Insufficient documentation

## 2014-02-09 DIAGNOSIS — Z7982 Long term (current) use of aspirin: Secondary | ICD-10-CM | POA: Insufficient documentation

## 2014-02-09 DIAGNOSIS — I2510 Atherosclerotic heart disease of native coronary artery without angina pectoris: Secondary | ICD-10-CM | POA: Insufficient documentation

## 2014-02-09 DIAGNOSIS — Z791 Long term (current) use of non-steroidal anti-inflammatories (NSAID): Secondary | ICD-10-CM | POA: Diagnosis not present

## 2014-02-09 DIAGNOSIS — I471 Supraventricular tachycardia: Secondary | ICD-10-CM | POA: Diagnosis not present

## 2014-02-09 DIAGNOSIS — Z87891 Personal history of nicotine dependence: Secondary | ICD-10-CM | POA: Insufficient documentation

## 2014-02-09 DIAGNOSIS — Z5309 Procedure and treatment not carried out because of other contraindication: Secondary | ICD-10-CM | POA: Diagnosis not present

## 2014-02-09 DIAGNOSIS — Z87442 Personal history of urinary calculi: Secondary | ICD-10-CM | POA: Diagnosis not present

## 2014-02-09 DIAGNOSIS — Z9889 Other specified postprocedural states: Secondary | ICD-10-CM | POA: Insufficient documentation

## 2014-02-09 DIAGNOSIS — Z79899 Other long term (current) drug therapy: Secondary | ICD-10-CM | POA: Insufficient documentation

## 2014-02-09 DIAGNOSIS — R000 Tachycardia, unspecified: Secondary | ICD-10-CM | POA: Diagnosis not present

## 2014-02-09 DIAGNOSIS — K219 Gastro-esophageal reflux disease without esophagitis: Secondary | ICD-10-CM | POA: Insufficient documentation

## 2014-02-09 DIAGNOSIS — N202 Calculus of kidney with calculus of ureter: Secondary | ICD-10-CM | POA: Insufficient documentation

## 2014-02-09 DIAGNOSIS — I4891 Unspecified atrial fibrillation: Secondary | ICD-10-CM | POA: Insufficient documentation

## 2014-02-09 DIAGNOSIS — Z87448 Personal history of other diseases of urinary system: Secondary | ICD-10-CM | POA: Diagnosis not present

## 2014-02-09 HISTORY — DX: Presence of dental prosthetic device (complete) (partial): Z97.2

## 2014-02-09 HISTORY — DX: Other specified personal risk factors, not elsewhere classified: Z91.89

## 2014-02-09 HISTORY — DX: Male erectile dysfunction, unspecified: N52.9

## 2014-02-09 HISTORY — DX: Calculus of kidney: N20.0

## 2014-02-09 HISTORY — DX: Presence of spectacles and contact lenses: Z97.3

## 2014-02-09 HISTORY — DX: Psoriasis, unspecified: L40.9

## 2014-02-09 HISTORY — DX: Cyst of kidney, acquired: N28.1

## 2014-02-09 HISTORY — DX: Palpitations: R00.2

## 2014-02-09 HISTORY — DX: Complete loss of teeth, unspecified cause, unspecified class: K08.109

## 2014-02-09 HISTORY — DX: Presence of external hearing-aid: Z97.4

## 2014-02-09 HISTORY — DX: Calculus of ureter: N20.1

## 2014-02-09 HISTORY — DX: Personal history of urinary calculi: Z87.442

## 2014-02-09 LAB — POCT I-STAT 4, (NA,K, GLUC, HGB,HCT)
Glucose, Bld: 101 mg/dL — ABNORMAL HIGH (ref 70–99)
HEMATOCRIT: 31 % — AB (ref 39.0–52.0)
Hemoglobin: 10.5 g/dL — ABNORMAL LOW (ref 13.0–17.0)
Potassium: 3.7 mmol/L (ref 3.5–5.1)
Sodium: 143 mmol/L (ref 135–145)

## 2014-02-09 SURGERY — CANCELLED PROCEDURE
Anesthesia: Monitor Anesthesia Care

## 2014-02-09 MED ORDER — FENTANYL CITRATE 0.05 MG/ML IJ SOLN
INTRAMUSCULAR | Status: AC
  Filled 2014-02-09: qty 4

## 2014-02-09 MED ORDER — MIDAZOLAM HCL 2 MG/2ML IJ SOLN
INTRAMUSCULAR | Status: AC
  Filled 2014-02-09: qty 2

## 2014-02-09 MED ORDER — METOPROLOL TARTRATE 1 MG/ML IV SOLN
INTRAVENOUS | Status: DC | PRN
  Administered 2014-02-09 (×3): 5 mg via INTRAVENOUS

## 2014-02-09 MED ORDER — MIDAZOLAM HCL 5 MG/5ML IJ SOLN
INTRAMUSCULAR | Status: DC | PRN
  Administered 2014-02-09: 1 mg via INTRAVENOUS

## 2014-02-09 MED ORDER — CEFAZOLIN SODIUM 1-5 GM-% IV SOLN
1.0000 g | INTRAVENOUS | Status: AC
  Administered 2014-02-09: 2 g via INTRAVENOUS
  Filled 2014-02-09: qty 50

## 2014-02-09 MED ORDER — LACTATED RINGERS IV SOLN
INTRAVENOUS | Status: DC
  Administered 2014-02-09: 09:00:00 via INTRAVENOUS
  Filled 2014-02-09: qty 1000

## 2014-02-09 MED ORDER — FENTANYL CITRATE 0.05 MG/ML IJ SOLN
25.0000 ug | INTRAMUSCULAR | Status: DC | PRN
  Filled 2014-02-09: qty 1

## 2014-02-09 MED ORDER — LACTATED RINGERS IV SOLN
INTRAVENOUS | Status: DC
  Administered 2014-02-09: 10:00:00 via INTRAVENOUS
  Filled 2014-02-09: qty 1000

## 2014-02-09 MED ORDER — CEFAZOLIN SODIUM-DEXTROSE 2-3 GM-% IV SOLR
INTRAVENOUS | Status: AC
  Filled 2014-02-09: qty 50

## 2014-02-09 SURGICAL SUPPLY — 37 items
BAG DRAIN URO-CYSTO SKYTR STRL (DRAIN) ×1 IMPLANT
BAG DRN UROCATH (DRAIN)
BASKET LASER NITINOL 1.9FR (BASKET) IMPLANT
BASKET ZERO TIP NITINOL 2.4FR (BASKET) IMPLANT
BSKT STON RTRVL 120 1.9FR (BASKET)
BSKT STON RTRVL ZERO TP 2.4FR (BASKET)
CANISTER SUCT LVC 12 LTR MEDI- (MISCELLANEOUS) IMPLANT
CATH URET 5FR 28IN CONE TIP (BALLOONS)
CATH URET 5FR 28IN OPEN ENDED (CATHETERS) IMPLANT
CATH URET 5FR 70CM CONE TIP (BALLOONS) IMPLANT
CLOTH BEACON ORANGE TIMEOUT ST (SAFETY) ×1 IMPLANT
DRAPE CAMERA CLOSED 9X96 (DRAPES) ×1 IMPLANT
ELECT REM PT RETURN 9FT ADLT (ELECTROSURGICAL)
ELECTRODE REM PT RTRN 9FT ADLT (ELECTROSURGICAL) IMPLANT
FIBER LASER FLEXIVA 1000 (UROLOGICAL SUPPLIES) IMPLANT
FIBER LASER FLEXIVA 200 (UROLOGICAL SUPPLIES) IMPLANT
FIBER LASER FLEXIVA 365 (UROLOGICAL SUPPLIES) IMPLANT
FIBER LASER FLEXIVA 550 (UROLOGICAL SUPPLIES) IMPLANT
GLOVE BIO SURGEON STRL SZ7 (GLOVE) IMPLANT
GLOVE INDICATOR 7.5 STRL GRN (GLOVE) IMPLANT
GLOVE SURG SS PI 8.0 STRL IVOR (GLOVE) ×1 IMPLANT
GOWN PREVENTION PLUS LG XLONG (DISPOSABLE) IMPLANT
GOWN STRL REIN XL XLG (GOWN DISPOSABLE) ×1 IMPLANT
GOWN STRL REUS W/ TWL LRG LVL3 (GOWN DISPOSABLE) IMPLANT
GOWN STRL REUS W/ TWL XL LVL3 (GOWN DISPOSABLE) IMPLANT
GOWN STRL REUS W/TWL LRG LVL3 (GOWN DISPOSABLE)
GOWN STRL REUS W/TWL XL LVL3 (GOWN DISPOSABLE)
GUIDEWIRE 0.038 PTFE COATED (WIRE) IMPLANT
GUIDEWIRE ANG ZIPWIRE 038X150 (WIRE) IMPLANT
GUIDEWIRE STR DUAL SENSOR (WIRE) ×1 IMPLANT
IV NS IRRIG 3000ML ARTHROMATIC (IV SOLUTION) ×1 IMPLANT
KIT BALLIN UROMAX 15FX10 (LABEL) IMPLANT
KIT BALLN UROMAX 15FX4 (MISCELLANEOUS) IMPLANT
KIT BALLN UROMAX 26 75X4 (MISCELLANEOUS)
PACK CYSTO (CUSTOM PROCEDURE TRAY) ×1 IMPLANT
SET HIGH PRES BAL DIL (LABEL)
SHEATH ACCESS URETERAL 38CM (SHEATH) IMPLANT

## 2014-02-09 NOTE — ED Notes (Addendum)
Pt from surgery center about to go in for kidney stone surgery when pt became tachycardic. Pt has 20 g in left wrist. Pt has no complaints, no chest pain or SOB. Given 15 of labetalol w/o decrease in HR at 1019

## 2014-02-09 NOTE — Discharge Instructions (Signed)
Supraventricular Tachycardia °Supraventricular tachycardia (SVT) is an abnormal heart rhythm (arrhythmia) that causes the heart to beat very fast (tachycardia). This kind of fast heartbeat originates in the upper chambers of the heart (atria). SVT can cause the heart to beat greater than 100 beats per minute. SVT can have a rapid burst of heartbeats. This can start and stop suddenly without warning and is called nonsustained. SVT can also be sustained, in which the heart beats at a continuous fast rate.  °CAUSES  °There can be different causes of SVT. Some of these include: °· Heart valve problems such as mitral valve prolapse. °· An enlarged heart (hypertrophic cardiomyopathy). °· Congenital heart problems. °· Heart inflammation (pericarditis). °· Hyperthyroidism. °· Low potassium or magnesium levels. °· Caffeine. °· Drug use such as cocaine, methamphetamines, or stimulants. °· Some over-the-counter medicines such as: °¨ Decongestants. °¨ Diet medicines. °¨ Herbal medicines. °SYMPTOMS  °Symptoms of SVT can vary. Symptoms depend on whether the SVT is sustained or nonsustained. You may experience: °· No symptoms (asymptomatic). °· An awareness of your heart beating rapidly (palpitations). °· Shortness of breath. °· Chest pain or pressure. °If your blood pressure drops because of the SVT, you may experience: °· Fainting or near fainting. °· Weakness. °· Dizziness. °DIAGNOSIS  °Different tests can be performed to diagnose SVT, such as: °· An electrocardiogram (EKG). This is a painless test that records the electrical activity of your heart. °· Holter monitor. This is a 24 hour recording of your heart rhythm. You will be given a diary. Write down all symptoms that you have and what you were doing at the time you experienced symptoms. °· Arrhythmia monitor. This is a small device that your wear for several weeks. It records the heart rhythm when you have symptoms. °· Echocardiogram. This is an imaging test to help detect  abnormal heart structure such as congenital abnormalities, heart valve problems, or heart enlargement. °· Stress test. This test can help determine if the SVT is related to exercise. °· Electrophysiology study (EPS). This is a procedure that evaluates your heart's electrical system and can help your caregiver find the cause of your SVT. °TREATMENT  °Treatment of SVT depends on the symptoms, how often it recurs, and whether there are any underlying heart problems.  °· If symptoms are rare and no other cardiac disease is present, no treatment may be needed. °· Blood work may be done to check potassium, magnesium, and thyroid hormone levels to see if they are abnormal. If these levels are abnormal, treatment to correct the problems will occur. °Medicines °Your caregiver may use oral medicines to treat SVT. These medicines are given for long-term control of SVT. Medicines may be used alone or in combination with other treatments. These medicines work to slow nerve impulses in the heart muscle. These medicines can also be used to treat high blood pressure. Some of these medicines may include: °· Calcium channel blockers. °· Beta blockers. °· Digoxin. °Nonsurgical procedures °Nonsurgical techniques may be used if oral medicines do not work. Some examples include: °· Cardioversion. This technique uses either drugs or an electrical shock to restore a normal heart rhythm. °¨ Cardioversion drugs may be given through an intravenous (IV) line to help "reset" the heart rhythm. °¨ In electrical cardioversion, the caregiver shocks your heart to stop its beat for a split second. This helps to reset the heart to a normal rhythm. °· Ablation. This procedure is done under mild sedation. High frequency radio wave energy is used to   destroy the area of heart tissue responsible for the SVT. °HOME CARE INSTRUCTIONS  °· Do not smoke. °· Only take medicines prescribed by your caregiver. Check with your caregiver before using over-the-counter  medicines. °· Check with your caregiver about how much alcohol and caffeine (coffee, tea, colas, or chocolate) you may have. °· It is very important to keep all follow-up referrals and appointments in order to properly manage this problem. °SEEK IMMEDIATE MEDICAL CARE IF: °· You have dizziness. °· You faint or nearly faint. °· You have shortness of breath. °· You have chest pain or pressure. °· You have sudden nausea or vomiting. °· You have profuse sweating. °· You are concerned about how long your symptoms last. °· You are concerned about the frequency of your SVT episodes. °If you have the above symptoms, call your local emergency services (911 in [place] immediately. Do not drive yourself to the hospital. °MAKE SURE YOU:  °· Understand these instructions. °· Will watch your condition. °· Will get help right away if you are not doing well or get worse. °Document Released: 12/30/2004 Document Revised: 03/24/2011 Document Reviewed: 04/13/2008 °Exit[employer]® Patient Information ©2015 [employer]. This information is not intended to replace advice given to you by your health care provider. Make sure you discuss any questions you have with your health care provider. ° °

## 2014-02-09 NOTE — Op Note (Signed)
  Brandon Fischer was found to have undiagnosed afib with a rate of 150 and metoprolol only brought him down to 125.  The case was cancelled and he was transferred to the ER for evaluation.  He has scheduled f/u with [place] on 02/16/14 and we will reassess his situation at that time.

## 2014-02-09 NOTE — ED Notes (Signed)
Bed: WA24 Expected date:  Expected time:  Means of arrival:  Comments: [facility]

## 2014-02-09 NOTE — ED Provider Notes (Signed)
CSN: [id number]     Arrival date & time 02/09/14  1113 History   First MD Initiated Contact with Patient 02/09/14 1158     Chief Complaint  Patient presents with  . Tachycardia     (Consider location/radiation/quality/duration/timing/severity/associated sxs/prior Treatment) Patient is a 81 y.o. male presenting with general illness.  Illness Location:  Chest Quality:  Heart racing Severity:  Moderate Onset quality:  Sudden Duration: 30 min. Timing:  Constant Progression:  Unchanged Chronicity:  Recurrent Context:  Ongoing wu Relieved by:  Nothing Worsened by:  Nothing Associated symptoms: no cough, no fever, no loss of consciousness, no shortness of breath and no vomiting     Past Medical History  Diagnosis Date  . Hypertension   . Dyslipidemia   . Right ureteral calculus   . History of kidney stones   . Organic impotence   . Heart palpitations   . Coronary artery disease cardiologist-- dr Stevphen [place] CAD  per cath 08-06-2004 HX PALPITATIONS-AND -TACHYCARDIA-  . GERD (gastroesophageal reflux disease)   . Wears glasses   . Wears hearing aid     BILATERAL  . Full dentures   . Psoriasis   . Simple renal cyst     right  . Nephrolithiasis     RIGHT  . At risk for sleep apnea     STOP-BANG= 4    SENT TO PCP 02-06-2014   Past Surgical History  Procedure Laterality Date  . Carpal tunnel release Bilateral 2005  . Nephrolithotomy  01/16/2011    Procedure: NEPHROLITHOTOMY PERCUTANEOUS;  Surgeon: Malka So;  Location: [facility];  Service: Urology;  Laterality: Left;  C-Arm  Holmium Laser  . Colonoscopy w/ polypectomy  last one 04-05-2008  . Knee arthroscopy w/ debridement Right 11-23-2002    and Synovectomy  . Cardiac catheterization  08-06-2004 dr Verlon Setting    (abnormal stress test) Non-obstructive CAD, pLAD 20-30%,  LCX 30-40%, pRCA 20-30%, dRCA 40%, distal LM  mild diffuse calcifation 20-30% tapering stenosis,  preserved LVSF ef 55-60%  . Nephrolithotomy  1981   . Cholecystectomy open  1982    and NEPHROLITHOTOMY   No family history on file. History  Substance Use Topics  . Smoking status: Former Smoker -- 1.00 packs/day for 40 years    Types: Cigarettes    Quit date: 02/07/1999  . Smokeless tobacco: Never Used  . Alcohol Use: No    Review of Systems  Constitutional: Negative for fever.  Respiratory: Negative for cough and shortness of breath.   Gastrointestinal: Negative for vomiting.  Neurological: Negative for loss of consciousness.  All other systems reviewed and are negative.     Allergies  Review of patient's allergies indicates no known allergies.  Home Medications   Prior to Admission medications   Medication Sig Start Date End Date Taking? Authorizing Provider  aspirin 81 MG tablet Take 81 mg by mouth every other day.    Yes Historical Provider, MD  atorvastatin (LIPITOR) 40 MG tablet Take 40 mg by mouth every evening.    Yes Historical Provider, MD  diphenhydramine-acetaminophen (TYLENOL PM) 25-500 MG TABS Take 2 tablets by mouth at bedtime.   Yes Historical Provider, MD  meloxicam (MOBIC) 15 MG tablet Take 15 mg by mouth daily.   Yes Historical Provider, MD  metoprolol succinate (TOPROL-XL) 25 MG 24 hr tablet Take 25 mg by mouth 2 (two) times daily.    Yes Historical Provider, MD  NIFEdipine (ADALAT CC) 90 MG 24 hr tablet  Take 90 mg by mouth daily.   Yes Historical Provider, MD  pantoprazole (PROTONIX) 40 MG tablet Take 40 mg by mouth daily.   Yes Historical Provider, MD  traMADol (ULTRAM) 50 MG tablet Take 50 mg by mouth every 6 (six) hours as needed for moderate pain.    Yes Historical Provider, MD   BP 115/74 mmHg  Pulse 74  Temp(Src) 98 F (36.7 C) (Oral)  Resp 11  SpO2 99% Physical Exam  Constitutional: He is oriented to person, place, and time. He appears well-developed and well-nourished.  HENT:  Head: Normocephalic and atraumatic.  Eyes: Conjunctivae and EOM are normal.  Neck: Normal range of motion.  Neck supple.  Cardiovascular: Regular rhythm and normal heart sounds.  Tachycardia present.   Pulmonary/Chest: Effort normal and breath sounds normal. No respiratory distress.  Abdominal: He exhibits no distension. There is no tenderness. There is no rebound and no guarding.  Musculoskeletal: Normal range of motion.  Neurological: He is alert and oriented to person, place, and time.  Skin: Skin is warm and dry.  Vitals reviewed.   ED Course  Procedures (including critical care time) Labs Review Labs Reviewed - No data to display  Imaging Review No results found.   EKG Interpretation   Date/Time:  Thursday February 09 2014 12:13:01 EST Ventricular Rate:  75 PR Interval:  237 QRS Duration: 87 QT Interval:  392 QTC Calculation: 438 R Axis:   -5 Text Interpretation:  Sinus rhythm Prolonged PR interval Probable left  atrial enlargement rate has decreased since last tracing Confirmed by  Debby Freiberg [phone]) on 02/09/2014 12:15:47 PM      MDM   Final diagnoses:  Supraventricular tachycardia    81 y.o. male with pertinent PMH of recurrent episodic tachycardia without known etiology at this time, but with prior heart monitoring presents with asymptomatic tachycardia.  Patient states that he can feel his heart beating faster than normal, however denies any chest pain, dyspnea, other symptoms. The patient was sent from urology where he was undergoing preop evaluation prior to stone management. On arrival to the ED the patient had an EKG with likely SVT. This responded to vagal maneuvers. No recurrent symptoms. The patient did receive metoprolol prior to my examination, and is on rate control medication at home. As patient had no symptoms and was stable, feel him stable for discharge. Discharged home in stable condition.   I have reviewed all laboratory and imaging studies if ordered as above  1. Supraventricular tachycardia         Debby Freiberg, MD 02/09/14 1406

## 2014-02-09 NOTE — Interval H&P Note (Signed)
History and Physical Interval Note:  02/09/2014 9:38 AM  Brandon Fischer  has presented today for surgery, with the diagnosis of RIGHT DISTAL URETERAL STONE  The various methods of treatment have been discussed with the patient and family. After consideration of risks, benefits and other options for treatment, the patient has consented to  Procedure(s): CYSTOSCOPY/URETEROSCOPY RIGHT RETROGRADE,PYLEGRAM STONE EXTRACTION,HOLMIUM LASER/JJ STENT PLACEMENT (Right) HOLMIUM LASER APPLICATION (Right) as a surgical intervention .  The patient's history has been reviewed, patient examined, no change in status, stable for surgery.  I have reviewed the patient's chart and labs.  Questions were answered to the patient's satisfaction.     [name]

## 2014-02-09 NOTE — Transfer of Care (Signed)
Immediate Anesthesia Transfer of Care Note  Patient: Brandon Fischer  Procedure(s) Performed: Procedure(s): CYSTOSCOPY/URETEROSCOPY RIGHT RETROGRADE,PYLEGRAM STONE EXTRACTION,HOLMIUM LASER/JJ STENT PLACEMENT (Right) HOLMIUM LASER APPLICATION (Right)  Patient Location: PACU  Anesthesia Type:MAC  Level of Consciousness: awake, alert , oriented and patient cooperative  Airway & Oxygen Therapy: Patient Spontanous Breathing and Patient connected to nasal cannula oxygen  Post-op Assessment: Report given to PACU RN and Post -op Vital signs reviewed and unstable, Anesthesiologist notified  Post vital signs: Reviewed and stable  Last Vitals:  Filed Vitals:   02/09/14 0847  BP: 147/60  Pulse: 58  Temp: 36.5 C  Resp: 16    Complications: No apparent anesthesia complications and unexpected post-op hospitalization

## 2014-02-09 NOTE — Anesthesia Preprocedure Evaluation (Addendum)
Anesthesia Evaluation  Patient identified by MRN, date of birth, ID band Patient awake    Reviewed: Allergy & Precautions, H&P , NPO status , Patient's Chart, lab work & pertinent test results  Airway Mallampati: II  TM Distance: >3 FB Neck ROM: full    Dental  (+) Edentulous Upper, Edentulous Lower, Dental Advisory Given   Pulmonary neg pulmonary ROS, former smoker,  Stop bang 4 breath sounds clear to auscultation  Pulmonary exam normal       Cardiovascular Exercise Tolerance: Good hypertension, Pt. on medications and Pt. on home beta blockers + CAD Rhythm:regular Rate:Normal  Non-obstructive CAD.  Palpitations and tachycardia   Neuro/Psych negative neurological ROS  negative psych ROS   GI/Hepatic negative GI ROS, Neg liver ROS, GERD-  Medicated and Controlled,  Endo/Other  negative endocrine ROS  Renal/GU negative Renal ROS  negative genitourinary   Musculoskeletal   Abdominal   Peds  Hematology negative hematology ROS (+)   Anesthesia Other Findings   Reproductive/Obstetrics negative OB ROS                       Anesthesia Physical Anesthesia Plan  ASA: III  Anesthesia Plan: General   Post-op Pain Management:    Induction: Intravenous  Airway Management Planned: LMA  Additional Equipment:   Intra-op Plan:   Post-operative Plan:   Informed Consent: I have reviewed the patients History and Physical, chart, labs and discussed the procedure including the risks, benefits and alternatives for the proposed anesthesia with the patient or authorized representative who has indicated his/her understanding and acceptance.   Dental Advisory Given  Plan Discussed with: CRNA and Surgeon  Anesthesia Plan Comments:         Anesthesia Quick Evaluation                                  Anesthesia Evaluation  Patient identified by MRN, date of birth, ID band Patient  awake    Reviewed: Allergy & Precautions, H&P , NPO status , Patient's Chart, lab work & pertinent test results  Airway Mallampati: II TM Distance: >3 FB Neck ROM: Full    Dental No notable dental hx.    Pulmonary neg pulmonary ROS, former smoker clear to auscultation  Pulmonary exam normal       Cardiovascular hypertension, Pt. on home beta blockers + CAD and neg cardio ROS Regular Normal    Neuro/Psych Negative Neurological ROS  Negative Psych ROS   GI/Hepatic negative GI ROS, Neg liver ROS, GERD-  ,  Endo/Other  Negative Endocrine ROS  Renal/GU negative Renal ROS  Genitourinary negative   Musculoskeletal negative musculoskeletal ROS (+)   Abdominal   Peds negative pediatric ROS (+)  Hematology negative hematology ROS (+)   Anesthesia Other Findings   Reproductive/Obstetrics negative OB ROS                          Anesthesia Physical Anesthesia Plan  ASA: III  Anesthesia Plan: General   Post-op Pain Management:    Induction: Intravenous  Airway Management Planned: LMA  Additional Equipment:   Intra-op Plan:   Post-operative Plan: Extubation in OR  Informed Consent: I have reviewed the patients History and Physical, chart, labs and discussed the procedure including the risks, benefits and alternatives for the proposed anesthesia with the patient or authorized representative who has indicated his/her understanding  and acceptance.   Dental advisory given  Plan Discussed with: CRNA  Anesthesia Plan Comments:        Anesthesia Quick Evaluation

## 2014-02-09 NOTE — Progress Notes (Signed)
When put on monitors in OR patient had heart rate of 150. Gave metoprolol 15 mg and rate dropped to 125, which was slow enough to appreciate that there were no p waves.  Cancelled induction and surgery and will send him to ER for medical management of atrial fibrillation with RVR.  He had history of tachycardia and palpitations and was recently being worked up for this with a holter monitor but we do not know the result of this and there was no history of AF documented on chart.   Lennix Kneisel  MD

## 2014-02-09 NOTE — OR Nursing (Signed)
Patient surgery cancelled by anesthesia due to elevated B/P and evidence of A-fib.

## 2014-02-16 DIAGNOSIS — R312 Other microscopic hematuria: Secondary | ICD-10-CM | POA: Diagnosis not present

## 2014-02-16 DIAGNOSIS — N201 Calculus of ureter: Secondary | ICD-10-CM | POA: Diagnosis not present

## 2014-02-23 DIAGNOSIS — I471 Supraventricular tachycardia: Secondary | ICD-10-CM | POA: Diagnosis not present

## 2014-02-23 DIAGNOSIS — Z87442 Personal history of urinary calculi: Secondary | ICD-10-CM | POA: Diagnosis not present

## 2014-03-10 ENCOUNTER — Other Ambulatory Visit: Payer: Self-pay | Admitting: Urology

## 2014-03-10 ENCOUNTER — Encounter (HOSPITAL_BASED_OUTPATIENT_CLINIC_OR_DEPARTMENT_OTHER): Payer: Self-pay | Admitting: *Deleted

## 2014-03-10 NOTE — Progress Notes (Addendum)
NPO AFTER MN. ARRIVE AT 0745. NEEDS ISTAT. CURRENT EKG IN CHART AND CARDIOLOGY NOTE WITH CLEARENCE IN CHART. WILL TAKE METOPROLOL, PROTONIX, AND NIFEDIPINE AM DOS W/ SIPS OF WATER.

## 2014-03-13 NOTE — Interval H&P Note (Signed)
History and Physical Interval Note: Active Problems Problems  1. Calculus of ureter (N20.1)   Assessed By: Jimmey Ralph (Urology); Last Assessed: 16 Feb 2014 2. Microscopic hematuria (R31.2)   Assessed By: Jimmey Ralph (Urology); Last Assessed: 16 Feb 2014  History of Present Illness Mr. Zinn returns today for a f/u KUB. Was scheduled for cystourethroscopy, R RPG, stone extraction 02/09/14 by Dr. Jeffie Pollock but this had to be cancelled due to A fib.     Interval Hx:  Today states he has not passed stone material. Denies hematuria. Pain is well controlled.   Vitals Vital Signs [Data Includes: Last 1 Day]  Recorded: [id number] 01:45PM  Height: 5 ft 11 in Weight: 195 lb  BMI Calculated: 27.2 BSA Calculated: 2.09 Blood Pressure: 149 / 76 Temperature: 97.9 F Heart Rate: 61  Physical Exam Constitutional: Well nourished and well developed . No acute distress. The patient appears well hydrated.  Abdomen: The abdomen is mildly obese. The abdomen is soft and nontender. No LLQ tenderness. No CVA tenderness.    Results/Data Urine [Data Includes: Last 1 Day]   [id number]  COLOR AMBER   APPEARANCE CLEAR   SPECIFIC GRAVITY 1.030   pH 5.5   GLUCOSE NEG mg/dL  BILIRUBIN SMALL   KETONE TRACE mg/dL  BLOOD MOD   PROTEIN 30 mg/dL  UROBILINOGEN 2 mg/dL  NITRITE NEG   LEUKOCYTE ESTERASE NEG   SQUAMOUS EPITHELIAL/HPF RARE   WBC 0-2 WBC/hpf  RBC 7-10 RBC/hpf  BACTERIA RARE   CRYSTALS NONE SEEN   CASTS Hyaline casts noted   Other MUCUS NOTED/4    The following images/tracing/specimen were independently visualized:  KUB: stable large distal (R) UVJ calculus.  The following clinical lab reports were reviewed:  UA- microscopic hematuria noted.    Assessment Assessed  1. Calculus of ureter (N20.1) 2. Microscopic hematuria (R31.2)  Plan Health Maintenance  1. UA With REFLEX; [Do Not Release]; Status:Complete;   Done: [id number] 01:25PM  Will ask for cardiac clearance for Dr. Christen Butter and will reschedule pt for cystourethroscopy, R RPG, stone extraction by Dr. Jeffie Pollock    03/13/2014 3:59 PM  Mallie Mussel  has presented today for surgery, with the diagnosis of RIGHT DISTAL URETERAL STONE  The various methods of treatment have been discussed with the patient and family. After consideration of risks, benefits and other options for treatment, the patient has consented to  Procedure(s): CYSTO/RIGHT RETROGRADE PYELOGRAM/RIGHT URETEROSCOPY/STONE EXTRACTION/STENT PLACEMENT (Right) RIGHT HOLMIUM LASER APPLICATION (Right) as a surgical intervention .  The patient's history has been reviewed, patient examined, no change in status, stable for surgery.  I have reviewed the patient's chart and labs.  Questions were answered to the patient's satisfaction.     Alfreddie Consalvo J

## 2014-03-13 NOTE — H&P (Signed)
Active Problems Problems  1. Benign prostatic hypertrophy without lower urinary tract symptoms (N40.0) 2. Calculus of ureter (N20.1) 3. Erectile dysfunction due to arterial insufficiency (N52.01) 4. Kidney stone on left side (N20.0) 5. Kidney stone on right side (N20.0) 6. Renal cyst, acquired, right (N28.1)  History of Present Illness Brandon Fischer returns today in f/u he has a history of stones and had a left PCNL on 01/16/11. He was seen in the spring with a left UVJ stone which he subsequently passed. He has had no recent symptoms but the KUB today shows a 73mm right distal stone that was previously in the RLP.  He has no associated signs or symptoms.   Past Medical History Problems  1. History of Arthritis 2. History of Gross hematuria (R31.0) 3. History of balanitis (Z87.438) 4. History of hypercholesterolemia (Z86.39) 5. History of hypertension (Z86.79) 6. History of kidney stones (Z87.442) 7. History of Hydronephrosis (N13.30) 8. History of Nephrolithiasis Of The Left Kidney  Surgical History Problems  1. History of Cholecystectomy 2. History of Knee Surgery 3. History of Lithotomy 4. History of Percutaneous Lithotomy For Stone Over 2cm.  Current Meds 1. Adult Aspirin Low Strength 81 MG TBDP;  Therapy: (Recorded:28Oct2008) to Recorded 2. Atorvastatin Calcium 80 MG Oral Tablet;  Therapy: [id number] to Recorded 3. Clobetasol Propionate E 0.05 % External Cream;  Therapy: [id number] to Recorded 4. Meloxicam TABS;  Therapy: (Recorded:03Dec2014) to Recorded 5. Metoprolol Tartrate TABS;  Therapy: (Recorded:29Jul2010) to Recorded 6. NIFEdipine ER Osmotic Release 90 MG Oral Tablet Extended Release 24 Hour;  Therapy: 05-26-2013 to Recorded 7. Ramipril 2.5 MG Oral Capsule;  Therapy: (Recorded:28Oct2008) to Recorded 8. Tamsulosin HCl - 0.4 MG Oral Capsule; TAKE 1 CAPSULE Daily;  Therapy: 08Apr2015 to (Evaluate:07Jun2015); Last Rx:08Apr2015 Ordered 9. TraMADol HCl - 50 MG Oral  Tablet;  Therapy: [id number] to Recorded  Allergies Medication  1. No Known Drug Allergies  Family History Problems  1. Family history of Acute Myocardial Infarction : Brandon Fischer 2. Family history of Colon Cancer : Brandon Fischer 3. Family history of Death In The Family Brandon Fischer   May [birth year] 2062. Family history of Death In The Family Brandon Fischer   68 5. Family history of Family Health Status Number Of Children   1 son 1 dgt  Social History Problems  1. Denied: Alcohol Use 2. Caffeine Use   pepsi, tea 1-2 cups 3. Former smoker [id number] 4. Marital History - Currently Married   Brandon Fischer 5. Occupation:   retired Therapist, sports 6. Tobacco Use   smoker for 40 yrs-quit 9 yrs ago  Past and social history reviewed and updated.   Review of Systems  Genitourinary: no urinary frequency, no dysuria and no hematuria.  Gastrointestinal: no flank pain.    Vitals Vital Signs [Data Includes: Last 1 Day]  Recorded: [id number] 09:14AM  Weight: 190 lb  BMI Calculated: 26.5 BSA Calculated: 2.06 Blood Pressure: 131 / 73 Temperature: 97.7 F Heart Rate: 57  Physical Exam Constitutional: Well nourished and well developed . No acute distress.  Abdomen: The abdomen is mildly obese. The abdomen is soft and nontender. No CVA tenderness.    Results/Data Urine [Data Includes: Last 1 Day]   [id number]  COLOR AMBER   APPEARANCE CLEAR   SPECIFIC GRAVITY 1.025   pH 6.0   GLUCOSE NEG mg/dL  BILIRUBIN SMALL   KETONE NEG mg/dL  BLOOD TRACE   PROTEIN TRACE mg/dL  UROBILINOGEN 4 mg/dL  NITRITE NEG   LEUKOCYTE ESTERASE NEG   SQUAMOUS EPITHELIAL/HPF RARE   WBC 0-2 WBC/hpf  RBC 3-6 RBC/hpf  BACTERIA RARE   CRYSTALS NONE SEEN   CASTS NONE SEEN   Other MUCUS NOTED    The following images/tracing/specimen were independently visualized:  KUB today shows a 5x67mm right distal stone that was previously in the RLP. There is still as stone in the RLP that is about 4x101mm. He has 3-4 stable LMP and LLP stones with the largest  about 36mm but the previously noted LUVJ stone is no longer present. He has a stable right pelvic phlebolith. He has clips in the RUQ. He has mild lumbar degenerative changes but no other abnormalities are noted.  The following clinical lab reports were reviewed:  UA reviewed.    Assessment Assessed  1. Calculus of ureter (N20.1) 2. Kidney stone on left side (N20.0) 3. Kidney stone on right side (N20.0)  He has bilateral rena stones and a new 45mm RUVJ stone but no symptoms.   Plan Calculus of ureter  1. Renew: Tamsulosin HCl - 0.4 MG Oral Capsule; TAKE 1 CAPSULE Daily 2. Follow-up MD/NP/PA Office  Follow-up  Status: Hold For - Appointment,Date of Service   Requested for: [id number] 3. KUB; Status:Hold For - Appointment,Date of Service; Requested for:09Dec2015;  4. RENAL U/S RIGHT; Status:Hold For - Appointment,Date of Service; Requested  for:09Dec2015;  Health Maintenance  5. UA With REFLEX; [Do Not Release]; Status:Resulted - Requires Verification;   Done:  68GSU1103 08:47AM Kidney stone on right side  6. KUB; Status:Resulted - Requires Verification;   Done: 15XYV8592 12:00AM  I am going to have him resume tamsulosin.  He has pain meds on hand.   He will return in 3-4 weeks with a KUB and if he hasn't passed the stone or shown progression, we will need to consider possible treatment.   Discussion/Summary CC: Dr. Deland Pretty.   His stone has not passed and he will proceed with ureteroscopy.

## 2014-03-14 ENCOUNTER — Ambulatory Visit (HOSPITAL_BASED_OUTPATIENT_CLINIC_OR_DEPARTMENT_OTHER): Payer: Medicare Other | Admitting: Anesthesiology

## 2014-03-14 ENCOUNTER — Encounter (HOSPITAL_BASED_OUTPATIENT_CLINIC_OR_DEPARTMENT_OTHER): Payer: Self-pay | Admitting: Anesthesiology

## 2014-03-14 ENCOUNTER — Ambulatory Visit (HOSPITAL_BASED_OUTPATIENT_CLINIC_OR_DEPARTMENT_OTHER)
Admission: RE | Admit: 2014-03-14 | Discharge: 2014-03-14 | Disposition: A | Discharge status: Home / Self Care | Payer: Medicare Other | Source: Ambulatory Visit | Attending: Urology | Admitting: Urology

## 2014-03-14 ENCOUNTER — Encounter (HOSPITAL_BASED_OUTPATIENT_CLINIC_OR_DEPARTMENT_OTHER)
Admission: RE | Disposition: A | Discharge status: Home / Self Care | Payer: Self-pay | Source: Ambulatory Visit | Attending: Urology

## 2014-03-14 DIAGNOSIS — N200 Calculus of kidney: Secondary | ICD-10-CM | POA: Insufficient documentation

## 2014-03-14 DIAGNOSIS — Z7952 Long term (current) use of systemic steroids: Secondary | ICD-10-CM | POA: Insufficient documentation

## 2014-03-14 DIAGNOSIS — K219 Gastro-esophageal reflux disease without esophagitis: Secondary | ICD-10-CM | POA: Diagnosis not present

## 2014-03-14 DIAGNOSIS — I4891 Unspecified atrial fibrillation: Secondary | ICD-10-CM | POA: Insufficient documentation

## 2014-03-14 DIAGNOSIS — E780 Pure hypercholesterolemia: Secondary | ICD-10-CM | POA: Insufficient documentation

## 2014-03-14 DIAGNOSIS — Z7982 Long term (current) use of aspirin: Secondary | ICD-10-CM | POA: Diagnosis not present

## 2014-03-14 DIAGNOSIS — I739 Peripheral vascular disease, unspecified: Secondary | ICD-10-CM | POA: Diagnosis not present

## 2014-03-14 DIAGNOSIS — I10 Essential (primary) hypertension: Secondary | ICD-10-CM | POA: Diagnosis not present

## 2014-03-14 DIAGNOSIS — N400 Enlarged prostate without lower urinary tract symptoms: Secondary | ICD-10-CM | POA: Diagnosis not present

## 2014-03-14 DIAGNOSIS — Z9049 Acquired absence of other specified parts of digestive tract: Secondary | ICD-10-CM | POA: Diagnosis not present

## 2014-03-14 DIAGNOSIS — M1990 Unspecified osteoarthritis, unspecified site: Secondary | ICD-10-CM | POA: Diagnosis not present

## 2014-03-14 DIAGNOSIS — I2510 Atherosclerotic heart disease of native coronary artery without angina pectoris: Secondary | ICD-10-CM | POA: Diagnosis not present

## 2014-03-14 DIAGNOSIS — Z79899 Other long term (current) drug therapy: Secondary | ICD-10-CM | POA: Diagnosis not present

## 2014-03-14 DIAGNOSIS — Z79891 Long term (current) use of opiate analgesic: Secondary | ICD-10-CM | POA: Diagnosis not present

## 2014-03-14 DIAGNOSIS — Z87891 Personal history of nicotine dependence: Secondary | ICD-10-CM | POA: Insufficient documentation

## 2014-03-14 DIAGNOSIS — N201 Calculus of ureter: Secondary | ICD-10-CM | POA: Diagnosis not present

## 2014-03-14 HISTORY — DX: Personal history of other diseases of the circulatory system: Z86.79

## 2014-03-14 HISTORY — DX: Supraventricular tachycardia, unspecified: I47.10

## 2014-03-14 HISTORY — DX: Other specified symptoms and signs involving the circulatory and respiratory systems: R09.89

## 2014-03-14 HISTORY — PX: CYSTOSCOPY WITH RETROGRADE PYELOGRAM, URETEROSCOPY AND STENT PLACEMENT: SHX5789

## 2014-03-14 HISTORY — DX: Supraventricular tachycardia: I47.1

## 2014-03-14 HISTORY — DX: Occlusion and stenosis of bilateral carotid arteries: I65.23

## 2014-03-14 HISTORY — DX: Hyperlipidemia, unspecified: E78.5

## 2014-03-14 HISTORY — PX: HOLMIUM LASER APPLICATION: SHX5852

## 2014-03-14 HISTORY — DX: Atrioventricular block, first degree: I44.0

## 2014-03-14 LAB — POCT I-STAT, CHEM 8
BUN: 19 mg/dL (ref 6–23)
CALCIUM ION: 1.23 mmol/L (ref 1.13–1.30)
CREATININE: 1.2 mg/dL (ref 0.50–1.35)
Chloride: 103 mmol/L (ref 96–112)
Glucose, Bld: 94 mg/dL (ref 70–99)
HCT: 31 % — ABNORMAL LOW (ref 39.0–52.0)
Hemoglobin: 10.5 g/dL — ABNORMAL LOW (ref 13.0–17.0)
POTASSIUM: 4 mmol/L (ref 3.5–5.1)
Sodium: 142 mmol/L (ref 135–145)
TCO2: 22 mmol/L (ref 0–100)

## 2014-03-14 SURGERY — CYSTOURETEROSCOPY, WITH RETROGRADE PYELOGRAM AND STENT INSERTION
Anesthesia: General | Site: Ureter | Laterality: Right

## 2014-03-14 MED ORDER — ONDANSETRON HCL 4 MG/2ML IJ SOLN
INTRAMUSCULAR | Status: DC | PRN
  Administered 2014-03-14: 4 mg via INTRAVENOUS

## 2014-03-14 MED ORDER — ACETAMINOPHEN 325 MG PO TABS
650.0000 mg | ORAL_TABLET | ORAL | Status: DC | PRN
  Filled 2014-03-14: qty 2

## 2014-03-14 MED ORDER — CEFAZOLIN SODIUM-DEXTROSE 2-3 GM-% IV SOLR
INTRAVENOUS | Status: AC
  Filled 2014-03-14: qty 50

## 2014-03-14 MED ORDER — ACETAMINOPHEN 650 MG RE SUPP
650.0000 mg | RECTAL | Status: DC | PRN
  Filled 2014-03-14: qty 1

## 2014-03-14 MED ORDER — FENTANYL CITRATE 0.05 MG/ML IJ SOLN
INTRAMUSCULAR | Status: DC | PRN
  Administered 2014-03-14: 25 ug via INTRAVENOUS
  Administered 2014-03-14: 50 ug via INTRAVENOUS

## 2014-03-14 MED ORDER — DEXAMETHASONE SODIUM PHOSPHATE 4 MG/ML IJ SOLN
INTRAMUSCULAR | Status: DC | PRN
  Administered 2014-03-14: 5 mg via INTRAVENOUS

## 2014-03-14 MED ORDER — CEFAZOLIN SODIUM 1-5 GM-% IV SOLN
1.0000 g | INTRAVENOUS | Status: DC
  Filled 2014-03-14: qty 50

## 2014-03-14 MED ORDER — CEFAZOLIN SODIUM-DEXTROSE 2-3 GM-% IV SOLR
2.0000 g | INTRAVENOUS | Status: AC
  Administered 2014-03-14: 2 g via INTRAVENOUS
  Filled 2014-03-14: qty 50

## 2014-03-14 MED ORDER — EPHEDRINE SULFATE 50 MG/ML IJ SOLN
INTRAMUSCULAR | Status: DC | PRN
  Administered 2014-03-14: 12.5 mg via INTRAVENOUS

## 2014-03-14 MED ORDER — FENTANYL CITRATE 0.05 MG/ML IJ SOLN
25.0000 ug | INTRAMUSCULAR | Status: DC | PRN
  Filled 2014-03-14: qty 1

## 2014-03-14 MED ORDER — PHENAZOPYRIDINE HCL 100 MG PO TABS
ORAL_TABLET | ORAL | Status: AC
  Filled 2014-03-14: qty 2

## 2014-03-14 MED ORDER — PHENAZOPYRIDINE HCL 200 MG PO TABS
200.0000 mg | ORAL_TABLET | Freq: Three times a day (TID) | ORAL | Status: DC | PRN
  Administered 2014-03-14: 200 mg via ORAL
  Filled 2014-03-14: qty 1

## 2014-03-14 MED ORDER — PROPOFOL 10 MG/ML IV BOLUS
INTRAVENOUS | Status: DC | PRN
  Administered 2014-03-14: 130 mg via INTRAVENOUS

## 2014-03-14 MED ORDER — IOHEXOL 350 MG/ML SOLN
INTRAVENOUS | Status: DC | PRN
  Administered 2014-03-14: 10 mL

## 2014-03-14 MED ORDER — OXYCODONE-ACETAMINOPHEN 5-325 MG PO TABS: 1.0000 | ORAL_TABLET | Freq: Four times a day (QID) | ORAL | Status: DC | PRN

## 2014-03-14 MED ORDER — ACETAMINOPHEN 10 MG/ML IV SOLN
INTRAVENOUS | Status: DC | PRN
  Administered 2014-03-14: 1000 mg via INTRAVENOUS

## 2014-03-14 MED ORDER — SODIUM CHLORIDE 0.9 % IR SOLN
Status: DC | PRN
Start: 2014-03-14 — End: 2014-03-14
  Administered 2014-03-14: 3000 mL
  Administered 2014-03-14: 1000 mL

## 2014-03-14 MED ORDER — LACTATED RINGERS IV SOLN
INTRAVENOUS | Status: DC
  Administered 2014-03-14: 09:00:00 via INTRAVENOUS
  Filled 2014-03-14: qty 1000

## 2014-03-14 MED ORDER — LIDOCAINE HCL (CARDIAC) 20 MG/ML IV SOLN
INTRAVENOUS | Status: DC | PRN
  Administered 2014-03-14: 50 mg via INTRAVENOUS

## 2014-03-14 MED ORDER — OXYCODONE HCL 5 MG PO TABS
5.0000 mg | ORAL_TABLET | ORAL | Status: DC | PRN
  Filled 2014-03-14: qty 2

## 2014-03-14 MED ORDER — SODIUM CHLORIDE 0.9 % IJ SOLN
3.0000 mL | Freq: Two times a day (BID) | INTRAMUSCULAR | Status: DC
  Filled 2014-03-14: qty 3

## 2014-03-14 MED ORDER — FENTANYL CITRATE 0.05 MG/ML IJ SOLN
INTRAMUSCULAR | Status: AC
  Filled 2014-03-14: qty 4

## 2014-03-14 MED ORDER — SODIUM CHLORIDE 0.9 % IJ SOLN
3.0000 mL | INTRAMUSCULAR | Status: DC | PRN
  Filled 2014-03-14: qty 3

## 2014-03-14 MED ORDER — PHENAZOPYRIDINE HCL 200 MG PO TABS: 200.0000 mg | ORAL_TABLET | Freq: Three times a day (TID) | ORAL | Status: DC | PRN

## 2014-03-14 MED ORDER — SODIUM CHLORIDE 0.9 % IV SOLN
250.0000 mL | INTRAVENOUS | Status: DC | PRN
  Filled 2014-03-14: qty 250

## 2014-03-14 MED ORDER — PROMETHAZINE HCL 25 MG/ML IJ SOLN
6.2500 mg | INTRAMUSCULAR | Status: DC | PRN
  Filled 2014-03-14: qty 1

## 2014-03-14 SURGICAL SUPPLY — 44 items
BAG DRAIN URO-CYSTO SKYTR STRL (DRAIN) ×3 IMPLANT
BAG DRN UROCATH (DRAIN) ×1
BASKET LASER NITINOL 1.9FR (BASKET) IMPLANT
BASKET ZERO TIP NITINOL 2.4FR (BASKET) ×2 IMPLANT
BSKT STON RTRVL 120 1.9FR (BASKET)
BSKT STON RTRVL ZERO TP 2.4FR (BASKET) ×1
CANISTER SUCT LVC 12 LTR MEDI- (MISCELLANEOUS) ×2 IMPLANT
CATH URET 5FR 28IN CONE TIP (BALLOONS)
CATH URET 5FR 28IN OPEN ENDED (CATHETERS) ×2 IMPLANT
CATH URET 5FR 70CM CONE TIP (BALLOONS) IMPLANT
CLOTH BEACON ORANGE TIMEOUT ST (SAFETY) ×3 IMPLANT
ELECT REM PT RETURN 9FT ADLT (ELECTROSURGICAL)
ELECTRODE REM PT RTRN 9FT ADLT (ELECTROSURGICAL) IMPLANT
FIBER LASER FLEXIVA 1000 (UROLOGICAL SUPPLIES) IMPLANT
FIBER LASER FLEXIVA 200 (UROLOGICAL SUPPLIES) IMPLANT
FIBER LASER FLEXIVA 365 (UROLOGICAL SUPPLIES) ×2 IMPLANT
FIBER LASER FLEXIVA 550 (UROLOGICAL SUPPLIES) IMPLANT
GLOVE BIO SURGEON STRL SZ 6.5 (GLOVE) ×1 IMPLANT
GLOVE BIO SURGEONS STRL SZ 6.5 (GLOVE) ×1
GLOVE BIOGEL PI IND STRL 7.0 (GLOVE) IMPLANT
GLOVE BIOGEL PI IND STRL 7.5 (GLOVE) IMPLANT
GLOVE BIOGEL PI INDICATOR 7.0 (GLOVE) ×2
GLOVE BIOGEL PI INDICATOR 7.5 (GLOVE) ×2
GLOVE INDICATOR 7.0 STRL GRN (GLOVE) ×2 IMPLANT
GLOVE SKINSENSE NS SZ6.5 (GLOVE) ×2
GLOVE SKINSENSE STRL SZ6.5 (GLOVE) IMPLANT
GLOVE SURG SS PI 7.5 STRL IVOR (GLOVE) ×2 IMPLANT
GLOVE SURG SS PI 8.0 STRL IVOR (GLOVE) ×3 IMPLANT
GOWN STRL REUS W/ TWL LRG LVL3 (GOWN DISPOSABLE) IMPLANT
GOWN STRL REUS W/TWL LRG LVL3 (GOWN DISPOSABLE) ×6 IMPLANT
GOWN STRL REUS W/TWL XL LVL3 (GOWN DISPOSABLE) ×2 IMPLANT
GUIDEWIRE 0.038 PTFE COATED (WIRE) IMPLANT
GUIDEWIRE ANG ZIPWIRE 038X150 (WIRE) IMPLANT
GUIDEWIRE STR DUAL SENSOR (WIRE) ×3 IMPLANT
IV NS 1000ML (IV SOLUTION) ×3
IV NS 1000ML BAXH (IV SOLUTION) IMPLANT
IV NS IRRIG 3000ML ARTHROMATIC (IV SOLUTION) ×3 IMPLANT
KIT BALLIN UROMAX 15FX10 (LABEL) IMPLANT
KIT BALLN UROMAX 15FX4 (MISCELLANEOUS) IMPLANT
KIT BALLN UROMAX 26 75X4 (MISCELLANEOUS)
PACK CYSTO (CUSTOM PROCEDURE TRAY) ×3 IMPLANT
SET HIGH PRES BAL DIL (LABEL)
SHEATH ACCESS URETERAL 38CM (SHEATH) ×2 IMPLANT
STENT URET 6FRX26 CONTOUR (STENTS) ×2 IMPLANT

## 2014-03-14 NOTE — Anesthesia Postprocedure Evaluation (Signed)
  Anesthesia Post-op Note  Patient: Brandon Fischer  Procedure(s) Performed: Procedure(s) (LRB): CYSTO/RIGHT RETROGRADE PYELOGRAM/RIGHT URETEROSCOPY/STONE EXTRACTION/STENT PLACEMENT (Right) RIGHT HOLMIUM LASER APPLICATION (Right)  Patient Location: PACU  Anesthesia Type: General  Level of Consciousness: awake and alert   Airway and Oxygen Therapy: Patient Spontanous Breathing  Post-op Pain: mild  Post-op Assessment: Post-op Vital signs reviewed, Patient's Cardiovascular Status Stable, Respiratory Function Stable, Patent Airway and No signs of Nausea or vomiting  Last Vitals:  Filed Vitals:   03/14/14 1030  BP: 136/74  Pulse: 57  Temp:   Resp: 18    Post-op Vital Signs: stable   Complications: No apparent anesthesia complications

## 2014-03-14 NOTE — Anesthesia Preprocedure Evaluation (Addendum)
Anesthesia Evaluation  Patient identified by MRN, date of birth, ID band Patient awake    Reviewed: Allergy & Precautions, NPO status , Patient's Chart, lab work & pertinent test results  Airway Mallampati: II  TM Distance: >3 FB Neck ROM: Full    Dental  (+) Upper Dentures, Lower Dentures   Pulmonary former smoker,  breath sounds clear to auscultation  Pulmonary exam normal       Cardiovascular Exercise Tolerance: Good hypertension, Pt. on medications and Pt. on home beta blockers + CAD and + Peripheral Vascular Disease + dysrhythmias Supra Ventricular Tachycardia Rhythm:Regular Rate:Normal     Neuro/Psych negative neurological ROS  negative psych ROS   GI/Hepatic Neg liver ROS, GERD-  Medicated,  Endo/Other  negative endocrine ROS  Renal/GU Renal disease  negative genitourinary   Musculoskeletal negative musculoskeletal ROS (+)   Abdominal   Peds negative pediatric ROS (+)  Hematology negative hematology ROS (+)   Anesthesia Other Findings   Reproductive/Obstetrics negative OB ROS                            Anesthesia Physical Anesthesia Plan  ASA: III  Anesthesia Plan: General   Post-op Pain Management:    Induction: Intravenous  Airway Management Planned: LMA  Additional Equipment:   Intra-op Plan:   Post-operative Plan: Extubation in OR  Informed Consent: I have reviewed the patients History and Physical, chart, labs and discussed the procedure including the risks, benefits and alternatives for the proposed anesthesia with the patient or authorized representative who has indicated his/her understanding and acceptance.   Dental advisory given  Plan Discussed with: CRNA  Anesthesia Plan Comments:         Anesthesia Quick Evaluation

## 2014-03-14 NOTE — Brief Op Note (Signed)
03/14/2014  9:50 AM  PATIENT:  Brandon Fischer  81 y.o. male  PRE-OPERATIVE DIAGNOSIS:  RIGHT DISTAL URETERAL STONE  POST-OPERATIVE DIAGNOSIS:  RIGHT DISTAL URETERAL STONE  PROCEDURE:  Procedure(s): CYSTO/RIGHT RETROGRADE PYELOGRAM/RIGHT URETEROSCOPY/STONE EXTRACTION/STENT PLACEMENT (Right) RIGHT HOLMIUM LASER APPLICATION (Right)  SURGEON:  Surgeon(s) and Role:    * Malka So, MD - Primary  PHYSICIAN ASSISTANT:   ASSISTANTS: none   ANESTHESIA:   general  EBL:  Total I/O In: 300 [I.V.:300] Out: -   BLOOD ADMINISTERED:none  DRAINS: 6x26 right JJ stent with string   LOCAL MEDICATIONS USED:  NONE  SPECIMEN:  Source of Specimen:  stone fragments  DISPOSITION OF SPECIMEN:  to patient to bring to office  COUNTS:  YES  TOURNIQUET:  * No tourniquets in log *  DICTATION: .Other Dictation: Dictation Number [id number]  PLAN OF CARE: Discharge to home after PACU  PATIENT DISPOSITION:  PACU - hemodynamically stable.   Delay start of Pharmacological VTE agent (>24hrs) due to surgical blood loss or risk of bleeding: not applicable

## 2014-03-14 NOTE — Interval H&P Note (Signed)
History and Physical Interval Note:  03/14/2014 8:46 AM  Brandon Fischer  has presented today for surgery, with the diagnosis of RIGHT DISTAL URETERAL STONE  The various methods of treatment have been discussed with the patient and family. After consideration of risks, benefits and other options for treatment, the patient has consented to  Procedure(s): CYSTO/RIGHT RETROGRADE PYELOGRAM/RIGHT URETEROSCOPY/STONE EXTRACTION/STENT PLACEMENT (Right) RIGHT HOLMIUM LASER APPLICATION (Right) as a surgical intervention .  The patient's history has been reviewed, patient examined, no change in status, stable for surgery.  I have reviewed the patient's chart and labs.  Questions were answered to the patient's satisfaction.     Halei Hanover J

## 2014-03-14 NOTE — Transfer of Care (Signed)
Immediate Anesthesia Transfer of Care Note  Patient: Brandon Fischer  Procedure(s) Performed: Procedure(s) (LRB): CYSTO/RIGHT RETROGRADE PYELOGRAM/RIGHT URETEROSCOPY/STONE EXTRACTION/STENT PLACEMENT (Right) RIGHT HOLMIUM LASER APPLICATION (Right)  Patient Location: PACU  Anesthesia Type: General  Level of Consciousness: awake, alert  and oriented  Airway & Oxygen Therapy: Patient Spontanous Breathing and Patient connected to face mask oxygen  Post-op Assessment: Report given to PACU RN and Post -op Vital signs reviewed and stable  Post vital signs: Reviewed and stable  Complications: No apparent anesthesia complications Last Vitals:  Filed Vitals:   03/14/14 0756  BP: 145/65  Pulse: 55  Temp: 36.4 C  Resp: 16

## 2014-03-14 NOTE — Anesthesia Procedure Notes (Signed)
Procedure Name: LMA Insertion Date/Time: 03/14/2014 9:08 AM Performed by: Mechele Claude Pre-anesthesia Checklist: Patient identified, Emergency Drugs available, Suction available and Patient being monitored Patient Re-evaluated:Patient Re-evaluated prior to inductionOxygen Delivery Method: Circle System Utilized Preoxygenation: Pre-oxygenation with 100% oxygen Intubation Type: IV induction Ventilation: Mask ventilation without difficulty LMA: LMA inserted LMA Size: 5.0 Number of attempts: 1 Airway Equipment and Method: bite block Placement Confirmation: positive ETCO2 Tube secured with: Tape Dental Injury: Teeth and Oropharynx as per pre-operative assessment

## 2014-03-14 NOTE — Discharge Instructions (Addendum)
Ureteral Stent Implantation Ureteral stent implantation is the implantation of a soft plastic tube with multiple holes into the tube that drains urine from your kidney to your bladder (ureter). The stent helps drain your kidney when there is a blockage of the flow of urine in your ureter. The stent has a coil on each end to keep it from falling out. One end stays in the kidney. The other end stays in the bladder. It is most often taken out after any blockage has been removed or your ureter has healed. Short-term stents have a string attached to make removal quite easy. Removal of a short-term stent can be done in your health care provider's office or by you at home. Long-term stents need to be changed every few months. LET [facility] CARE PROVIDER KNOW ABOUT:  Any allergies you have.  All medicines you are taking, including vitamins, herbs, eye drops, creams, and over-the-counter medicines.  Previous problems you or members of your family have had with the use of anesthetics.  Any blood disorders you have.  Previous surgeries you have had.  Medical conditions you have. RISKS AND COMPLICATIONS Generally, ureteral stent implantation is a safe procedure. However, as with any procedure, complications can occur. Possible complications include:  Movement of the stent away from where it was originally placed (migration). This may affect the ability of the stent to properly drain your kidney. If migration of the stent occurs, the stent may need to be replaced or repositioned.  Perforation of the ureter.  Infection. BEFORE THE PROCEDURE  You may be asked to wash your genital area with sterile soap the morning of your procedure.  You may be given an oral antibiotic which you should take with a sip of water as prescribed by your health care provider.  You may be asked to not eat or drink for 8 hours before the surgery. PROCEDURE  First you will be given an anesthetic so you do not feel pain  during the procedure.  Your health care provider will insert a special lighted instrument called a cystoscope into your bladder. This allows your health care provider to see the opening to your ureter.  A thin wire is carefully threaded into your bladder and up the ureter. The stent is inserted over the wire and the wire is then removed.  Your bladder will be emptied of urine. AFTER THE PROCEDURE You will be taken to a recovery room until it is okay for you to go home. Document Released: 12/28/1999 Document Revised: 01/04/2013 Document Reviewed: 06/08/2012 [facility] Patient Information 2015 [place], [place]. This information is not intended to replace advice given to you by your health care provider. Make sure you discuss any questions you have with your health care provider.  You may remove the stent by pulling the attached string on Friday morning.  If you don't feel comfortable doing that, you can come to the office to have it done.      Post Anesthesia Home Care Instructions  Activity: Get plenty of rest for the remainder of the day. A responsible adult should stay with you for 24 hours following the procedure.  For the next 24 hours, DO NOT: -Drive a car -Paediatric nurse -Drink alcoholic beverages -Take any medication unless instructed by your physician -Make any legal decisions or sign important papers.  Meals: Start with liquid foods such as gelatin or soup. Progress to regular foods as tolerated. Avoid greasy, spicy, heavy foods. If nausea and/or vomiting occur, drink only clear  liquids until the nausea and/or vomiting subsides. Call your physician if vomiting continues.  Special Instructions/Symptoms: Your throat may feel dry or sore from the anesthesia or the breathing tube placed in your throat during surgery. If this causes discomfort, gargle with warm salt water. The discomfort should disappear within 24 hours.

## 2014-03-15 ENCOUNTER — Ambulatory Visit (INDEPENDENT_AMBULATORY_CARE_PROVIDER_SITE_OTHER): Payer: Medicare Other | Admitting: Internal Medicine

## 2014-03-15 ENCOUNTER — Encounter: Payer: Self-pay | Admitting: *Deleted

## 2014-03-15 ENCOUNTER — Encounter (HOSPITAL_BASED_OUTPATIENT_CLINIC_OR_DEPARTMENT_OTHER): Payer: Self-pay | Admitting: Urology

## 2014-03-15 VITALS — BP 128/60 | HR 60 | Ht 71.0 in | Wt 203.2 lb

## 2014-03-15 DIAGNOSIS — I10 Essential (primary) hypertension: Secondary | ICD-10-CM | POA: Diagnosis not present

## 2014-03-15 DIAGNOSIS — I471 Supraventricular tachycardia: Secondary | ICD-10-CM | POA: Diagnosis not present

## 2014-03-15 DIAGNOSIS — R002 Palpitations: Secondary | ICD-10-CM

## 2014-03-15 DIAGNOSIS — Z01812 Encounter for preprocedural laboratory examination: Secondary | ICD-10-CM | POA: Diagnosis not present

## 2014-03-15 NOTE — Assessment & Plan Note (Signed)
I discussed the treatment options with the patient. The risk, goals, benefits, and expectations of catheter ablation of SVT have been discussed in detail. Because he has had symptomatic SVT recurred despite medical therapy, he is a good candidate to undergo catheter ablation. He does have first-degree AV block, and might be a slight increased risk for the need for a pacemaker after ablation.

## 2014-03-15 NOTE — Progress Notes (Signed)
HPI Mr. Urieta is referred today by Dr. Gwendel Hanson for evaluation of SVT. His episodes began approximately 3 years ago. Since then he has had multiple episodes would start and stop suddenly, and are associated with shortness of breath and chest pressure. He gets dizzy and lightheaded. He has not had frank syncope. The episodes occur approximately once or sometimes twice a month. They typically last for 20-30 minutes. He has been able to terminate these episodes with vagal maneuvers. He has been on medical therapy with beta blockers, but has had persistence of his SVT. He was scheduled to undergo lithotripsy, it had to be canceled because he was in SVT prior to the procedure. No Known Allergies   Current Outpatient Prescriptions  Medication Sig Dispense Refill  . aspirin 81 MG tablet Take 81 mg by mouth every other day.     [name] atorvastatin (LIPITOR) 40 MG tablet Take 40 mg by mouth every evening.     . diphenhydramine-acetaminophen (TYLENOL PM) 25-500 MG TABS Take 2 tablets by mouth at bedtime.    [name] esomeprazole (NEXIUM) 40 MG capsule Take 40 mg by mouth as needed.     . meloxicam (MOBIC) 15 MG tablet Take 15 mg by mouth daily.    . metoprolol succinate (TOPROL-XL) 25 MG 24 hr tablet Take 25 mg by mouth 2 (two) times daily.     [name] NIFEdipine (ADALAT CC) 90 MG 24 hr tablet Take 90 mg by mouth every morning.     [name] oxyCODONE-acetaminophen (ROXICET) 5-325 MG per tablet Take 1 tablet by mouth every 6 (six) hours as needed for severe pain. 15 tablet 0  . pantoprazole (PROTONIX) 40 MG tablet Take 40 mg by mouth every morning.     . phenazopyridine (PYRIDIUM) 200 MG tablet Take 1 tablet (200 mg total) by mouth 3 (three) times daily as needed for pain. 15 tablet 0  . traMADol (ULTRAM) 50 MG tablet Take 50 mg by mouth every 6 (six) hours as needed for moderate pain.      No current facility-administered medications for this visit.     Past Medical History  Diagnosis Date  . Hypertension   . Right  ureteral calculus   . History of kidney stones   . Organic impotence   . Heart palpitations   . GERD (gastroesophageal reflux disease)   . Wears glasses   . Wears hearing aid     BILATERAL  . Full dentures   . Psoriasis   . Simple renal cyst     right  . Nephrolithiasis     RIGHT  . At risk for sleep apnea     STOP-BANG= 4    SENT TO PCP 02-06-2014  . PSVT (paroxysmal supraventricular tachycardia)   . Bilateral carotid artery stenosis without cerebral infarction     ASYMPTOMATIC--  BILATERAL ICA  50-69%  PER CARDIOLOGIST NOTE, DR Einar Gip  . Bilateral carotid bruits     LEFT > RIGHT  . Hyperlipidemia   . Coronary artery disease cardiologist-- dr Stevphen [place] CAD  per cath 08-06-2004 HX PALPITATIONS-AND -TACHYCARDIA-  . First degree heart block   . History of atrial fibrillation without current medication     EPISODE OF AFIB WITH RVR INTRAOPERATIVELY 02-09-2014 AT [facility]--  RESOLVED AND PT FOLLOWED UP WITH CARDIOLOGIST  DR Einar Gip    ROS:   All systems reviewed and negative except as noted in the HPI.   Past Surgical History  Procedure Laterality Date  .  Carpal tunnel release Bilateral 2005  . Nephrolithotomy  01/16/2011    Procedure: NEPHROLITHOTOMY PERCUTANEOUS;  Surgeon: Malka So;  Location: [facility];  Service: Urology;  Laterality: Left;  C-Arm  Holmium Laser  . Colonoscopy w/ polypectomy  last one 04-05-2008  . Knee arthroscopy w/ debridement Right 11-23-2002    and Synovectomy  . Cardiac catheterization  08-06-2004 dr Verlon Setting    (abnormal stress test) Non-obstructive CAD, pLAD 20-30%,  LCX 30-40%, pRCA 20-30%, dRCA 40%, distal LM  mild diffuse calcifation 20-30% tapering stenosis,  preserved LVSF ef 55-60%  . Nephrolithotomy  1981  . Cholecystectomy open  1982    and NEPHROLITHOTOMY  . Cystoscopy with retrograde pyelogram, ureteroscopy and stent placement Right 03/14/2014    Procedure: CYSTO/RIGHT RETROGRADE PYELOGRAM/RIGHT URETEROSCOPY/STONE EXTRACTION/STENT  PLACEMENT;  Surgeon: Malka So, MD;  Location: [facility];  Service: Urology;  Laterality: Right;  . Holmium laser application Right 05/18/8125    Procedure: RIGHT HOLMIUM LASER APPLICATION;  Surgeon: Malka So, MD;  Location: [facility];  Service: Urology;  Laterality: Right;     History reviewed. No pertinent family history.   History   Social History  . Marital Status: Married    Spouse Name: N/A  . Number of Children: 2  . Years of Education: N/A   Occupational History  . postal service      retired   Social History Main Topics  . Smoking status: Former Smoker -- 1.00 packs/day for 40 years    Types: Cigarettes    Quit date: 02/07/1999  . Smokeless tobacco: Never Used  . Alcohol Use: No  . Drug Use: No  . Sexual Activity: Not on file   Other Topics Concern  . Not on file   Social History Narrative     BP 128/60 mmHg  Pulse 60  Ht 5\' 11"  (1.803 m)  Wt 203 lb 3.2 oz (92.171 kg)  BMI 28.35 kg/m2  Physical Exam:  Well appearing 81 year old man, NAD HEENT: Unremarkable Neck:  7 cm JVD, no thyromegally Back:  No CVA tenderness Lungs:  Clear, with no wheezes, rales, or rhonchi. HEART:  Regular rate rhythm, no murmurs, no rubs, no clicks Abd:  soft, positive bowel sounds, no organomegally, no rebound, no guarding Ext:  2 plus pulses, no edema, no cyanosis, no clubbing Skin:  No rashes no nodules Neuro:  CN II through XII intact, motor grossly intact  EKG - normal sinus rhythm with no ventricular preexcitation. He does have first-degree AV block. ECG in tachycardia demonstrates a short RP tachycardia at a rate of 135 bpm.  Assess/Plan:

## 2014-03-15 NOTE — Op Note (Signed)
NAMELEONDRO, CORYELL NO.:  [id number]  MEDICAL RECORD NO.:  [id number]  LOCATION:                                 FACILITY:  PHYSICIAN:  Marshall Cork. Jeffie Pollock, M.D.    DATE OF BIRTH:  September 06, 1933  DATE OF PROCEDURE:  03/14/2014 DATE OF DISCHARGE:  03/14/2014                              OPERATIVE REPORT   PROCEDURES: 1. Cystoscopy with right retrograde pyelogram and interpretation. 2. Right ureteroscopic stone extraction with holmium lasertripsy and     stent insertion.  PREOPERATIVE DIAGNOSIS:  Right distal ureteral stone.  POSTOPERATIVE DIAGNOSIS:  Right distal ureteral stone.  SURGEON:  Marshall Cork. Jeffie Pollock, M.D.  ANESTHESIA:  General.  SPECIMEN:  Stone fragments.  DRAINS:  A 6-French 26-cm double-J stent.  BLOOD LOSS:  Minimal.  COMPLICATIONS:  None.  INDICATIONS:  Mr. Marcy is a 81 year old white male who has a non- progression of large right distal ureteral stone.  It was felt that ureteroscopy was indicated for treatment.  FINDINGS AND PROCEDURE:  He was given antibiotics and was taken to the operating room, where general anesthetic was induced.  He was placed in lithotomy position and fitted with PAS hose.  His perineum and genitalia were prepped with Betadine solution.  He was draped in the usual sterile fashion.  Cystoscopy was performed using a 22-French scope and 30-degree lens. Examination revealed normal urethra.  The external sphincter was intact. The prostatic urethra was widely patent from prior TURP but bladder neck was a little bit high.  Examination of bladder revealed severe trabeculation with some cellules. Ureteral orifices were unremarkable.  No tumors or stones were identified.  The right ureteral orifice was cannulated with a 5-French open-end catheter and contrast was instilled.  There was some J hooking with a filling defect at the distal ureter consistent with a stone with some mild proximal dilation.  An initial attempt to  pass a guidewire was not successful because of concern for a false passage.  I then removed the cystoscope and advanced a 6.5-French semirigid ureteroscope.  I was able to negotiate ureteral orifice on the right and found that there was no false passage.  A guidewire was then advanced through a slightly narrowed area and by the stone into the kidney.  Ureteroscope was removed and a 12-French introducer sheath inner core was used to dilate the ureter to the level of the stone.  Ureteroscope was then reinserted, and the stone was visualized.  It had a very dark appearance consistent with a calcium oxalate stone.  The 365 micron laser fiber was inserted, it was set on 1 watt and 10 hertz and the stone was broken into manageable fragments.  The fragments were then removed with a Nitinol basket.  Once all fragments had been removed per urethra into the bladder and inspection revealed nothing but dust.  Ureteroscope was removed.  The cystoscope was reinserted over the wire and a 6-French 26-cm double- J stent with string was passed to the kidney under fluoroscopic guidance.  The wire was removed leaving good coil in the bladder and a good coil in the kidney.  The bladder was then drained and the stone fragments were  evacuated from the bladder.  The cystoscope was removed with the stent string exiting the urethra.  Final fluoroscopy revealed good position of the stent.  The patient was taken down from lithotomy position.  His anesthetic was reversed.  He was moved to recovery room in stable condition.  There were no complications.  The stone fragments were given to the family to bring to the office for later analysis.     Marshall Cork. Jeffie Pollock, M.D.     JJW/MEDQ  D:  03/14/2014  T:  03/15/2014  Job:  [id number]

## 2014-03-15 NOTE — Assessment & Plan Note (Signed)
His palpitations appear to be related to his SVT. Hopefully they will go away after catheter ablation.

## 2014-03-15 NOTE — Patient Instructions (Addendum)
Your physician recommends that you continue on your current medications as directed. Please refer to the Current Medication list given to you today.  Your physician has recommended that you have an SVT ablation. Catheter ablation is a medical procedure used to treat some cardiac arrhythmias (irregular heartbeats). During catheter ablation, a long, thin, flexible tube is put into a blood vessel in your groin (upper thigh), or neck. This tube is called an ablation catheter. It is then guided to your heart through the blood vessel. Radio frequency waves destroy small areas of heart tissue where abnormal heartbeats may cause an arrhythmia to start. Please see the instruction sheet given to you today.  Pre procedure labs on 04/21/14 at Dr. Tanna Furry office.

## 2014-03-15 NOTE — Assessment & Plan Note (Signed)
His blood pressure is well controlled. No change in medical therapy. 

## 2014-03-16 DIAGNOSIS — Z08 Encounter for follow-up examination after completed treatment for malignant neoplasm: Secondary | ICD-10-CM | POA: Diagnosis not present

## 2014-03-16 DIAGNOSIS — D225 Melanocytic nevi of trunk: Secondary | ICD-10-CM | POA: Diagnosis not present

## 2014-03-16 DIAGNOSIS — L821 Other seborrheic keratosis: Secondary | ICD-10-CM | POA: Diagnosis not present

## 2014-03-16 DIAGNOSIS — Z85820 Personal history of malignant melanoma of skin: Secondary | ICD-10-CM | POA: Diagnosis not present

## 2014-03-27 DIAGNOSIS — N201 Calculus of ureter: Secondary | ICD-10-CM | POA: Diagnosis not present

## 2014-03-27 DIAGNOSIS — N200 Calculus of kidney: Secondary | ICD-10-CM | POA: Diagnosis not present

## 2014-03-28 DIAGNOSIS — N201 Calculus of ureter: Secondary | ICD-10-CM | POA: Diagnosis not present

## 2014-03-30 DIAGNOSIS — Z0000 Encounter for general adult medical examination without abnormal findings: Secondary | ICD-10-CM | POA: Diagnosis not present

## 2014-03-30 DIAGNOSIS — E780 Pure hypercholesterolemia: Secondary | ICD-10-CM | POA: Diagnosis not present

## 2014-03-30 DIAGNOSIS — Z125 Encounter for screening for malignant neoplasm of prostate: Secondary | ICD-10-CM | POA: Diagnosis not present

## 2014-03-30 DIAGNOSIS — K219 Gastro-esophageal reflux disease without esophagitis: Secondary | ICD-10-CM | POA: Diagnosis not present

## 2014-03-30 DIAGNOSIS — I10 Essential (primary) hypertension: Secondary | ICD-10-CM | POA: Diagnosis not present

## 2014-04-04 DIAGNOSIS — Z1212 Encounter for screening for malignant neoplasm of rectum: Secondary | ICD-10-CM | POA: Diagnosis not present

## 2014-04-04 DIAGNOSIS — Z791 Long term (current) use of non-steroidal anti-inflammatories (NSAID): Secondary | ICD-10-CM | POA: Diagnosis not present

## 2014-04-04 DIAGNOSIS — H6123 Impacted cerumen, bilateral: Secondary | ICD-10-CM | POA: Diagnosis not present

## 2014-04-04 DIAGNOSIS — I2510 Atherosclerotic heart disease of native coronary artery without angina pectoris: Secondary | ICD-10-CM | POA: Diagnosis not present

## 2014-04-04 DIAGNOSIS — I6523 Occlusion and stenosis of bilateral carotid arteries: Secondary | ICD-10-CM | POA: Diagnosis not present

## 2014-04-04 DIAGNOSIS — I471 Supraventricular tachycardia: Secondary | ICD-10-CM | POA: Diagnosis not present

## 2014-04-21 ENCOUNTER — Other Ambulatory Visit (INDEPENDENT_AMBULATORY_CARE_PROVIDER_SITE_OTHER): Payer: Medicare Other | Admitting: *Deleted

## 2014-04-21 DIAGNOSIS — Z01812 Encounter for preprocedural laboratory examination: Secondary | ICD-10-CM

## 2014-04-21 DIAGNOSIS — I471 Supraventricular tachycardia: Secondary | ICD-10-CM | POA: Diagnosis not present

## 2014-04-21 LAB — CBC WITH DIFFERENTIAL/PLATELET
BASOS PCT: 0.2 % (ref 0.0–3.0)
Basophils Absolute: 0 10*3/uL (ref 0.0–0.1)
Eosinophils Absolute: 0.1 10*3/uL (ref 0.0–0.7)
Eosinophils Relative: 1.2 % (ref 0.0–5.0)
HCT: 32.8 % — ABNORMAL LOW (ref 39.0–52.0)
HEMOGLOBIN: 11.2 g/dL — AB (ref 13.0–17.0)
Lymphocytes Relative: 10.2 % — ABNORMAL LOW (ref 12.0–46.0)
Lymphs Abs: 0.7 10*3/uL (ref 0.7–4.0)
MCHC: 34.3 g/dL (ref 30.0–36.0)
MCV: 80.9 fl (ref 78.0–100.0)
MONOS PCT: 6.7 % (ref 3.0–12.0)
Monocytes Absolute: 0.5 10*3/uL (ref 0.1–1.0)
NEUTROS ABS: 5.7 10*3/uL (ref 1.4–7.7)
Neutrophils Relative %: 81.7 % — ABNORMAL HIGH (ref 43.0–77.0)
PLATELETS: 199 10*3/uL (ref 150.0–400.0)
RBC: 4.05 Mil/uL — ABNORMAL LOW (ref 4.22–5.81)
RDW: 20.2 % — ABNORMAL HIGH (ref 11.5–15.5)
WBC: 6.9 10*3/uL (ref 4.0–10.5)

## 2014-04-21 LAB — BASIC METABOLIC PANEL
BUN: 17 mg/dL (ref 6–23)
CHLORIDE: 107 meq/L (ref 96–112)
CO2: 26 mEq/L (ref 19–32)
Calcium: 9 mg/dL (ref 8.4–10.5)
Creatinine, Ser: 1.32 mg/dL (ref 0.40–1.50)
GFR: 55.59 mL/min — ABNORMAL LOW (ref >60.00)
GLUCOSE: 145 mg/dL — AB (ref 70–99)
POTASSIUM: 3.9 meq/L (ref 3.5–5.1)
Sodium: 139 mEq/L (ref 135–145)

## 2014-04-28 ENCOUNTER — Encounter (HOSPITAL_COMMUNITY)
Admission: RE | Disposition: A | Discharge status: Home / Self Care | Payer: Self-pay | Source: Ambulatory Visit | Attending: Internal Medicine

## 2014-04-28 ENCOUNTER — Encounter (HOSPITAL_COMMUNITY): Payer: Self-pay | Admitting: Cardiology

## 2014-04-28 ENCOUNTER — Ambulatory Visit (HOSPITAL_COMMUNITY)
Admission: RE | Admit: 2014-04-28 | Discharge: 2014-04-28 | Disposition: A | Discharge status: Home / Self Care | Payer: Medicare Other | Source: Ambulatory Visit | Attending: Internal Medicine | Admitting: Internal Medicine

## 2014-04-28 DIAGNOSIS — I471 Supraventricular tachycardia: Secondary | ICD-10-CM | POA: Diagnosis not present

## 2014-04-28 DIAGNOSIS — Z87891 Personal history of nicotine dependence: Secondary | ICD-10-CM | POA: Insufficient documentation

## 2014-04-28 DIAGNOSIS — I2510 Atherosclerotic heart disease of native coronary artery without angina pectoris: Secondary | ICD-10-CM | POA: Insufficient documentation

## 2014-04-28 DIAGNOSIS — I10 Essential (primary) hypertension: Secondary | ICD-10-CM | POA: Diagnosis not present

## 2014-04-28 DIAGNOSIS — K219 Gastro-esophageal reflux disease without esophagitis: Secondary | ICD-10-CM | POA: Diagnosis not present

## 2014-04-28 DIAGNOSIS — Z9889 Other specified postprocedural states: Secondary | ICD-10-CM

## 2014-04-28 DIAGNOSIS — E785 Hyperlipidemia, unspecified: Secondary | ICD-10-CM | POA: Diagnosis not present

## 2014-04-28 DIAGNOSIS — Z7982 Long term (current) use of aspirin: Secondary | ICD-10-CM | POA: Diagnosis not present

## 2014-04-28 DIAGNOSIS — Z8679 Personal history of other diseases of the circulatory system: Secondary | ICD-10-CM

## 2014-04-28 HISTORY — PX: SUPRAVENTRICULAR TACHYCARDIA ABLATION: SHX5492

## 2014-04-28 HISTORY — DX: Personal history of other diseases of the circulatory system: Z86.79

## 2014-04-28 HISTORY — DX: Other specified postprocedural states: Z98.890

## 2014-04-28 SURGERY — SUPRAVENTRICULAR TACHYCARDIA ABLATION
Anesthesia: LOCAL

## 2014-04-28 MED ORDER — MELOXICAM 7.5 MG PO TABS: 15.0000 mg | ORAL_TABLET | Freq: Every day | ORAL | Status: DC

## 2014-04-28 MED ORDER — FENTANYL CITRATE (PF) 100 MCG/2ML IJ SOLN
INTRAMUSCULAR | Status: AC
  Filled 2014-04-28: qty 2

## 2014-04-28 MED ORDER — MIDAZOLAM HCL 5 MG/5ML IJ SOLN
INTRAMUSCULAR | Status: AC
  Filled 2014-04-28: qty 5

## 2014-04-28 MED ORDER — DEXTROSE 5 % IV SOLN
2.0000 ug/min | INTRAVENOUS | Status: DC
  Filled 2014-04-28: qty 5

## 2014-04-28 MED ORDER — SODIUM CHLORIDE 0.9 % IV SOLN: 250.0000 mL | INTRAVENOUS | Status: DC | PRN

## 2014-04-28 MED ORDER — ACETAMINOPHEN 325 MG PO TABS: 650.0000 mg | ORAL_TABLET | ORAL | Status: DC | PRN

## 2014-04-28 MED ORDER — ATORVASTATIN CALCIUM 80 MG PO TABS
80.0000 mg | ORAL_TABLET | Freq: Every day | ORAL | Status: DC
  Administered 2014-04-28: 80 mg via ORAL
  Filled 2014-04-28: qty 1

## 2014-04-28 MED ORDER — TRAMADOL HCL 50 MG PO TABS: 50.0000 mg | ORAL_TABLET | Freq: Four times a day (QID) | ORAL | Status: DC | PRN

## 2014-04-28 MED ORDER — PANTOPRAZOLE SODIUM 40 MG PO TBEC
40.0000 mg | DELAYED_RELEASE_TABLET | Freq: Every morning | ORAL | Status: DC
  Administered 2014-04-28: 40 mg via ORAL
  Filled 2014-04-28: qty 1

## 2014-04-28 MED ORDER — METOPROLOL SUCCINATE ER 25 MG PO TB24: 25.0000 mg | ORAL_TABLET | Freq: Two times a day (BID) | ORAL | Status: DC

## 2014-04-28 MED ORDER — METOPROLOL TARTRATE 25 MG PO TABS: 25.0000 mg | ORAL_TABLET | Freq: Two times a day (BID) | ORAL | Status: DC

## 2014-04-28 MED ORDER — NIFEDIPINE ER OSMOTIC RELEASE 90 MG PO TB24: 90.0000 mg | ORAL_TABLET | Freq: Every day | ORAL | Status: DC

## 2014-04-28 MED ORDER — BUPIVACAINE HCL (PF) 0.25 % IJ SOLN
INTRAMUSCULAR | Status: AC
  Filled 2014-04-28: qty 60

## 2014-04-28 MED ORDER — SODIUM CHLORIDE 0.9 % IJ SOLN: 3.0000 mL | INTRAMUSCULAR | Status: DC | PRN

## 2014-04-28 MED ORDER — ONDANSETRON HCL 4 MG/2ML IJ SOLN: 4.0000 mg | Freq: Four times a day (QID) | INTRAMUSCULAR | Status: DC | PRN

## 2014-04-28 MED ORDER — SODIUM CHLORIDE 0.9 % IV SOLN
INTRAVENOUS | Status: DC
  Administered 2014-04-28: 09:00:00 via INTRAVENOUS

## 2014-04-28 MED ORDER — SODIUM CHLORIDE 0.9 % IV SOLN
2.0000 ug/min | INTRAVENOUS | Status: DC
  Filled 2014-04-28: qty 5

## 2014-04-28 MED ORDER — SODIUM CHLORIDE 0.9 % IJ SOLN
3.0000 mL | Freq: Two times a day (BID) | INTRAMUSCULAR | Status: DC
  Administered 2014-04-28: 3 mL via INTRAVENOUS

## 2014-04-28 NOTE — CV Procedure (Addendum)
SURGEON: Thompson Grayer, MD   PREPROCEDURE DIAGNOSIS: SVT   POSTPROCEDURE DIAGNOSIS: Classic AV nodal reentrant tachycardia  PROCEDURES:  1. Comprehensive EP study.  2. Coronary sinus pacing and recording.  3. Mapping of supraventricular tachycardia.  4. Radiofrequency ablation of supraventricular tachycardia.  5. Arrhythmia induction with isuprel infused   INTRODUCTION: Brandon Fischer is a 81 y.o. male with a history of symptomatic recurrent short RP SVT who presents today for EP study and radiofrequency ablation. The patient has had recurrent symptomatic SVT. She has failed medical therapy with verapamil. She now presents for EP study and radiofrequency ablation of SVT.   DESCRIPTION OF PROCEDURE: Informed written consent was obtained and the patient was brought to the Electrophysiology Lab in the fasting state. The patient was adequately sedated with intravenous medication as outlined in the anesthesia report. The patient's right neck and groin was prepped and draped in the usual sterile fashion by the EP Lab staff. Using a percutaneous Seldinger technique, a 6 F hemostasis sheath was placed into the right internal jugular vein. A 6 F curved hexapolar catheter was advised through the RIJ into the coronary sinus for pacing and recording. Two 6-French and one 8-French hemostasis sheaths were placed  into the right common femoral vein. Two 6-French quadripolar Josephson catheters were introduced through the right common femoral vein and advanced into the His bundle and right ventricular apex positions for recording and pacing.   Presenting Measurements: The patient presented to the Electrophysiology Lab in sinus rhythm. The PR interval was 231 msec with a QRS of 88 msec and a Qt of 425 msec. The average RR interval was 1164 msec. The AH interval was 155 msec and the HV interval was 59 milliseconds.   EP study:  Ventricular pacing was performed which reveals midline concentric decremental VA  conduction with a single retrograde jump but no echo beats of tachycardias. The VA WCL was 460 msec and a VERP 600/468msec.  Rapid atrial pacing was performed with reveals PR >> RR with tachycardia not induced at 480 msec. AEST was performed which revealed multiple prolonged AH jumps with echo beats. Tachycardia was not induced. The AVNERP was 600/434msec.  Isuprel was infused at 2-4 mcg/min with an adequate acceleration in HR response observed. PR remained >> RR. Tachycardia was not induced. AEST was performed which again revealed multiple AH jumps. At 500/326msec the patient had an AH jump followed by sustained narrow complex SVT. The tachycardia cycle length was 457msec. The surface EKG was consistent with the patient's clinical tachycardia. VA time during tachycardia measured 51msec with earliest retrograde atrial activation recorded from the His electrogram. The tachycardia terminated with overdrive atrial pacing.  This was a reproducible event. V pacing was performed during tachycardia which revealed a VAV response.  The patient was therefore felt to have classic AV nodal reentrant tachycardia. I therefore elected to perform slow pathway modification.  Isuprel was discontinued and allowed to washout.   Ablation:  A 25F 73mm ablation catheter was therefore advanced through the right femoral vein and advanced into the right atrium. Mapping of Koch's triangle was performed which revealed a standard sized triangle. 4 RF energy applications were delivered at site 9 in Koch's triangle with a target temperature of 60 degrees of 50 watts for 60 seconds. Good accelerated junctional rhythm was observed with intact VA conduction.   Measurements following ablation:  Following ablation, Rapid atrial pacing was again performed with PR<RR and an AV WCL of 680 msec. AEST was performed on  isuprel which revealed a single AH jump and an echo beat and no tachycardia observed. The AVN ERP was 600/470 msec. V pacing was  performed which revealed midline concentric decremental VA conduction with a single retrograde jump but no echo beats of tachycardias. The VA WCL was 500 msec and a VERP 600/479msec.  No arrhythmias were induced Isuprel was again infused at 2 mcg/min with an adequate acceleration in heart rate response. RAP revealed PR<RR.  No arrhythmias were induced. Following ablation the AH interval was 144 msec with an HV interval of 63 msec. The procedure was therefore considered completed. All catheters were removed and the sheaths were aspirated and flushed. The sheaths were removed and hemostasis was assured. EBL<61ml. There were no early apparent complications.   CONCLUSIONS:  1. Sinus rhythm upon presentation.  2. The patient had dual AV nodal physiology with easily inducible classic AV nodal reentrant tachycardia, there were no other accessory pathways or arrhythmias induced  3. Successful radiofrequency modification of the slow AV nodal pathway  4. No inducible arrhythmias following ablation.  5. No early apparent complications.    Mikle Bosworth.D.

## 2014-04-28 NOTE — Discharge Summary (Signed)
Physician Discharge Summary       Patient ID: Brandon Fischer MRN: [id number] DOB/AGE: 1933-03-23 81 y.o.  Admit date: 04/28/2014 Discharge date: 04/28/2014 Primary Cardiologist:Dr. Einar Gip EP:  Dr. Lovena Le    Discharge Diagnoses:  Principal Problem:   SVT (supraventricular tachycardia) Active Problems:   S/P radiofrequency ablation operation for arrhythmia-SVT   Hypertension   Discharged Condition: good  Procedures: 04/28/14: PROCEDURES:  By Dr. Lovena Le 1. Comprehensive EP study.  2. Coronary sinus pacing and recording.  3. Mapping of supraventricular tachycardia.  4. Radiofrequency ablation of supraventricular tachycardia.  5. Arrhythmia induction with isuprel infused    Hospital Course:  81 year old male with SVT beginning 3 years ago.  He has had multiple episodes associated with SOB and and chest pressure.  The episodes occur approximately once or sometimes twice a month. They typically last for 20-30 minutes. He has been able to terminate these episodes with vagal maneuvers. He has been on medical therapy with beta blockers, but has had persistence of his SVT. He was scheduled to undergo lithotripsy, it had to be canceled because he was in SVT prior to the procedure.   He was seen and evaluated by Dr. Lovena Le and after prolonged discussion pt agreed to have EP study and SVT ablation.  He presented 04/28/14 for elective procedure.  He tolerated the procedure without complications and was observed several hours prior to discharge.  See CV note for details.    Pt ambulated prior to discharge without complications.  Tele:SR   Consults: None  Significant Diagnostic Studies:  BMET    Component Value Date/Time   NA 139 04/21/2014 0959   K 3.9 04/21/2014 0959   CL 107 04/21/2014 0959   CO2 26 04/21/2014 0959   GLUCOSE 145* 04/21/2014 0959   BUN 17 04/21/2014 0959   CREATININE 1.32 04/21/2014 0959   CALCIUM 9.0 04/21/2014 0959   GFRNONAA 65* 01/15/2011 1115   GFRAA 76*  01/15/2011 1115     CBC    Component Value Date/Time   WBC 6.9 04/21/2014 0959   RBC 4.05* 04/21/2014 0959   HGB 11.2* 04/21/2014 0959   HCT 32.8* 04/21/2014 0959   PLT 199.0 04/21/2014 0959   MCV 80.9 04/21/2014 0959   MCH 28.6 01/15/2011 1115   MCHC 34.3 04/21/2014 0959   RDW 20.2* 04/21/2014 0959   LYMPHSABS 0.7 04/21/2014 0959   MONOABS 0.5 04/21/2014 0959   EOSABS 0.1 04/21/2014 0959   BASOSABS 0.0 04/21/2014 0959       Discharge Exam: Blood pressure 137/59, pulse 56, temperature 98.3 F (36.8 C), temperature source Oral, resp. rate 13, height 5\' 11"  (1.803 m), weight 194 lb 3.2 oz (88.089 kg), SpO2 99 %.    Disposition: 01-Home or Self Care     Medication List    TAKE these medications        aspirin 81 MG tablet  Take 81 mg by mouth every other day.     atorvastatin 80 MG tablet  Commonly known as:  LIPITOR  Take 80 mg by mouth daily.     diphenhydramine-acetaminophen 25-500 MG Tabs  Commonly known as:  TYLENOL PM  Take 2 tablets by mouth at bedtime.     meloxicam 15 MG tablet  Commonly known as:  MOBIC  Take 15 mg by mouth daily.     metoprolol tartrate 25 MG tablet  Commonly known as:  LOPRESSOR  Take 25 mg by mouth 2 (two) times daily.     NIFEdipine  90 MG 24 hr tablet  Commonly known as:  PROCARDIA XL/ADALAT-CC  Take 90 mg by mouth daily.     oxyCODONE-acetaminophen 5-325 MG per tablet  Commonly known as:  ROXICET  Take 1 tablet by mouth every 6 (six) hours as needed for severe pain.     pantoprazole 40 MG tablet  Commonly known as:  PROTONIX  Take 40 mg by mouth every morning.     phenazopyridine 200 MG tablet  Commonly known as:  PYRIDIUM  Take 1 tablet (200 mg total) by mouth 3 (three) times daily as needed for pain.     traMADol 50 MG tablet  Commonly known as:  ULTRAM  Take 50 mg by mouth every 6 (six) hours as needed for moderate pain.       Follow-up Information    Follow up with Cristopher Peru, MD.   Specialty:   Cardiology   Why:  the office will call with date time   Contact information:   [address] [address] [phone]        Discharge Instructions: No driving for 5 days, no lifting over 5 lbs for 1 week.   No sex for 1 week.  If you work you may return to work in 1 week.  Keep site clean and dry.   Call [facility] at [phone] if any bleeding, swelling or drainage at cath site.  May shower, no tub baths no pools for 1 week.     Heart Healthy Diet.   Signed: Isaiah Serge Nurse Practitioner-Certified [facility]: [facility] 04/28/2014, 2:34 PM  Time spent on discharge :  30  minutes.     EP Attending  Agree with above plan.  Mikle Bosworth.D.

## 2014-04-28 NOTE — Progress Notes (Signed)
Site area: rt groin venous Site Prior to Removal:  Level 0 Pressure Applied For:12 minutes Manual:  yes  Patient Status During Pull:  Awake and pain free Post Pull Site:  Level 0 Post Pull Instructions Given:  yes Post Pull Pulses Present:  Dressing Applied:  yes Bedrest begins @ 12;40:00 Comments:  RT IJ pulled and pressure held for @12  minutes , level 0  Dressing applied

## 2014-04-28 NOTE — H&P (Signed)
HPI [name] is referred today by Dr. Gwendel Hanson for evaluation of SVT. His episodes began approximately 3 years ago. Since then he has had multiple episodes would start and stop suddenly, and are associated with shortness of breath and chest pressure. He gets dizzy and lightheaded. He has not had frank syncope. The episodes occur approximately once or sometimes twice a month. They typically last for 20-30 minutes. He has been able to terminate these episodes with vagal maneuvers. He has been on medical therapy with beta blockers, but has had persistence of his SVT. He was scheduled to undergo lithotripsy, it had to be canceled because he was in SVT prior to the procedure. No Known Allergies   Current Outpatient Prescriptions  Medication Sig Dispense Refill  . aspirin 81 MG tablet Take 81 mg by mouth every other day.     [name] atorvastatin (LIPITOR) 40 MG tablet Take 40 mg by mouth every evening.     . diphenhydramine-acetaminophen (TYLENOL PM) 25-500 MG TABS Take 2 tablets by mouth at bedtime.    [name] esomeprazole (NEXIUM) 40 MG capsule Take 40 mg by mouth as needed.     . meloxicam (MOBIC) 15 MG tablet Take 15 mg by mouth daily.    . metoprolol succinate (TOPROL-XL) 25 MG 24 hr tablet Take 25 mg by mouth 2 (two) times daily.     [name] NIFEdipine (ADALAT CC) 90 MG 24 hr tablet Take 90 mg by mouth every morning.     [name] oxyCODONE-acetaminophen (ROXICET) 5-325 MG per tablet Take 1 tablet by mouth every 6 (six) hours as needed for severe pain. 15 tablet 0  . pantoprazole (PROTONIX) 40 MG tablet Take 40 mg by mouth every morning.     . phenazopyridine (PYRIDIUM) 200 MG tablet Take 1 tablet (200 mg total) by mouth 3 (three) times daily as needed for pain. 15 tablet 0  . traMADol (ULTRAM) 50 MG tablet Take 50 mg by mouth every 6 (six) hours as needed for moderate pain.      No current facility-administered medications for this visit.     Past  Medical History  Diagnosis Date  . Hypertension   . Right ureteral calculus   . History of kidney stones   . Organic impotence   . Heart palpitations   . GERD (gastroesophageal reflux disease)   . Wears glasses   . Wears hearing aid     BILATERAL  . Full dentures   . Psoriasis   . Simple renal cyst     right  . Nephrolithiasis     RIGHT  . At risk for sleep apnea     STOP-BANG= 4 SENT TO PCP 02-06-2014  . PSVT (paroxysmal supraventricular tachycardia)   . Bilateral carotid artery stenosis without cerebral infarction     ASYMPTOMATIC-- BILATERAL ICA 50-69% PER CARDIOLOGIST NOTE, DR Einar Gip  . Bilateral carotid bruits     LEFT > RIGHT  . Hyperlipidemia   . Coronary artery disease cardiologist-- dr Stevphen [place] CAD per cath 08-06-2004 HX PALPITATIONS-AND -TACHYCARDIA-  . First degree heart block   . History of atrial fibrillation without current medication     EPISODE OF AFIB WITH RVR INTRAOPERATIVELY 02-09-2014 AT [facility]-- RESOLVED AND PT FOLLOWED UP WITH CARDIOLOGIST DR Einar Gip    ROS:  All systems reviewed and negative except as noted in the HPI.   Past Surgical History  Procedure Laterality Date  . Carpal tunnel release Bilateral 2005  . Nephrolithotomy  01/16/2011  Procedure: NEPHROLITHOTOMY PERCUTANEOUS; Surgeon: Malka So; Location: [facility]; Service: Urology; Laterality: Left; C-Arm Holmium Laser  . Colonoscopy w/ polypectomy  last one 04-05-2008  . Knee arthroscopy w/ debridement Right 11-23-2002    and Synovectomy  . Cardiac catheterization  08-06-2004 dr Verlon Setting    (abnormal stress test) Non-obstructive CAD, pLAD 20-30%, LCX 30-40%, pRCA 20-30%, dRCA 40%, distal LM mild diffuse calcifation 20-30% tapering stenosis, preserved LVSF ef 55-60%  . Nephrolithotomy  1981  . Cholecystectomy open  1982    and  NEPHROLITHOTOMY  . Cystoscopy with retrograde pyelogram, ureteroscopy and stent placement Right 03/14/2014    Procedure: CYSTO/RIGHT RETROGRADE PYELOGRAM/RIGHT URETEROSCOPY/STONE EXTRACTION/STENT PLACEMENT; Surgeon: Malka So, MD; Location: [facility]; Service: Urology; Laterality: Right;  . Holmium laser application Right 01/19/107    Procedure: RIGHT HOLMIUM LASER APPLICATION; Surgeon: Malka So, MD; Location: [facility]; Service: Urology; Laterality: Right;     History reviewed. No pertinent family history.   History   Social History  . Marital Status: Married    Spouse Name: N/A  . Number of Children: 2  . Years of Education: N/A   Occupational History  . postal service      retired   Social History Main Topics  . Smoking status: Former Smoker -- 1.00 packs/day for 40 years    Types: Cigarettes    Quit date: 02/07/1999  . Smokeless tobacco: Never Used  . Alcohol Use: No  . Drug Use: No  . Sexual Activity: Not on file   Other Topics Concern  . Not on file   Social History Narrative     BP 128/60 mmHg  Pulse 60  Ht 5\' 11"  (1.803 m)  Wt 203 lb 3.2 oz (92.171 kg)  BMI 28.35 kg/m2  Physical Exam:  Well appearing 81 year old man, NAD HEENT: Unremarkable Neck: 7 cm JVD, no thyromegally Back: No CVA tenderness Lungs: Clear, with no wheezes, rales, or rhonchi. HEART: Regular rate rhythm, no murmurs, no rubs, no clicks Abd: soft, positive bowel sounds, no organomegally, no rebound, no guarding Ext: 2 plus pulses, no edema, no cyanosis, no clubbing Skin: No rashes no nodules Neuro: CN II through XII intact, motor grossly intact  EKG - normal sinus rhythm with no ventricular preexcitation. He does have first-degree AV block. ECG in tachycardia demonstrates a short RP tachycardia at a rate of 135 bpm.  Assess/Plan:            SVT  (supraventricular tachycardia) - Evans Lance, MD at 03/15/2014 3:27 PM     Status: Written Related Problem: SVT (supraventricular tachycardia)   Expand All Collapse All   I discussed the treatment options with the patient. The risk, goals, benefits, and expectations of catheter ablation of SVT have been discussed in detail. Because he has had symptomatic SVT recurred despite medical therapy, he is a good candidate to undergo catheter ablation. He does have first-degree AV block, and might be a slight increased risk for the need for a pacemaker after ablation.            Palpitation - Evans Lance, MD at 03/15/2014 3:27 PM     Status: Written Related Problem: Palpitation   Expand All Collapse All   His palpitations appear to be related to his SVT. Hopefully they will go away after catheter ablation.            Hypertension - Evans Lance, MD at 03/15/2014 3:28 PM     Status:  Written Related Problem: Hypertension   Expand All Collapse All   His blood pressure is well controlled. No change in medical therapy.         Mikle Bosworth.D.

## 2014-04-28 NOTE — Interval H&P Note (Signed)
History and Physical Interval Note:  04/28/2014 9:19 AM  Brandon Fischer  has presented today for surgery, with the diagnosis of svt  The various methods of treatment have been discussed with the patient and family. After consideration of risks, benefits and other options for treatment, the patient has consented to  Procedure(s): SUPRAVENTRICULAR TACHYCARDIA ABLATION (N/A) as a surgical intervention .  The patient's history has been reviewed, patient examined, no change in status, stable for surgery.  I have reviewed the patient's chart and labs.  Questions were answered to the patient's satisfaction.     Mikle Bosworth.D.

## 2014-04-28 NOTE — Discharge Instructions (Signed)
No driving for 5 days, no lifting over 5 lbs for 1 week.   No sex for 1 week.  If you work you may return to work in 1 week.  Keep site clean and dry.   Call [facility] at [phone] if any bleeding, swelling or drainage at cath site.  May shower, no tub baths no pools for 1 week.     Heart Healthy Diet.   Your meds have not changed, take as before.

## 2014-05-02 ENCOUNTER — Telehealth: Payer: Self-pay | Admitting: Internal Medicine

## 2014-05-02 NOTE — Telephone Encounter (Signed)
Pt's wife called because she said that Dr. Lovena Le tolled the pt that he did not have to continue taken the metoprolol 25 mg, but when he was D/C from the hospital he was send home to continue taken it . Pt would like to clarify that issue.

## 2014-05-02 NOTE — Telephone Encounter (Signed)
Dr Lovena Le states that if pt's BP is normal he does not need to take this medication. Pt and wife are aware.pt's wife verbalized understanding.

## 2014-05-02 NOTE — Telephone Encounter (Signed)
New problem   Pt's wife is calling because she has a question concerning pt medication for Metoprolol 25mg . She was told in hospital by nurse to keep taking but stated Dr Lovena Le told them to keep taking. Please advise pt.

## 2014-05-25 ENCOUNTER — Encounter: Payer: Self-pay | Admitting: Internal Medicine

## 2014-05-25 ENCOUNTER — Ambulatory Visit (INDEPENDENT_AMBULATORY_CARE_PROVIDER_SITE_OTHER): Payer: Medicare Other | Admitting: Internal Medicine

## 2014-05-25 VITALS — BP 126/58 | HR 77 | Ht 71.0 in | Wt 200.4 lb

## 2014-05-25 DIAGNOSIS — I10 Essential (primary) hypertension: Secondary | ICD-10-CM

## 2014-05-25 NOTE — Progress Notes (Signed)
HPI [name] returns today for followup of SVT. He is a pleasant 81 yo man with a h/o SVT who was refractory to medical therapy. He underwent catheter ablatio approximately a month ago. He has done well. No recurrent symptoms of SVT. He notes that he is a little sluggish with exertion. No other complaints.  No Known Allergies   Current Outpatient Prescriptions  Medication Sig Dispense Refill  . aspirin 81 MG tablet Take 81 mg by mouth every other day.     [name] atorvastatin (LIPITOR) 40 MG tablet Take 40 mg by mouth daily.    . diphenhydramine-acetaminophen (TYLENOL PM) 25-500 MG TABS Take 2 tablets by mouth at bedtime.    . meloxicam (MOBIC) 15 MG tablet Take 15 mg by mouth daily.    [name] NIFEdipine (PROCARDIA XL/ADALAT-CC) 90 MG 24 hr tablet Take 90 mg by mouth daily.    . pantoprazole (PROTONIX) 40 MG tablet Take 40 mg by mouth every morning.     . traMADol (ULTRAM) 50 MG tablet Take 50 mg by mouth every 6 (six) hours as needed for moderate pain.      No current facility-administered medications for this visit.     Past Medical History  Diagnosis Date  . Hypertension   . Right ureteral calculus   . History of kidney stones   . Organic impotence   . Heart palpitations   . GERD (gastroesophageal reflux disease)   . Wears glasses   . Wears hearing aid     BILATERAL  . Full dentures   . Psoriasis   . Simple renal cyst     right  . Nephrolithiasis     RIGHT  . At risk for sleep apnea     STOP-BANG= 4    SENT TO PCP 02-06-2014  . PSVT (paroxysmal supraventricular tachycardia)   . Bilateral carotid artery stenosis without cerebral infarction     ASYMPTOMATIC--  BILATERAL ICA  50-69%  PER CARDIOLOGIST NOTE, DR Brandon Fischer  . Bilateral carotid bruits     LEFT > RIGHT  . Hyperlipidemia   . Coronary artery disease cardiologist-- dr Brandon Fischer CAD  per cath 08-06-2004 HX PALPITATIONS-AND -TACHYCARDIA-  . First degree heart block   . History of atrial fibrillation  without current medication     EPISODE OF AFIB WITH RVR INTRAOPERATIVELY 02-09-2014 AT [facility]--  RESOLVED AND PT FOLLOWED UP WITH CARDIOLOGIST  DR Brandon Fischer  . S/P radiofrequency ablation operation for arrhythmia-SVT 04/28/2014    ROS:   All systems reviewed and negative except as noted in the HPI.   Past Surgical History  Procedure Laterality Date  . Carpal tunnel release Bilateral 2005  . Nephrolithotomy  01/16/2011    Procedure: NEPHROLITHOTOMY PERCUTANEOUS;  Surgeon: Brandon Fischer;  Location: [facility];  Service: Urology;  Laterality: Left;  C-Arm  Holmium Laser  . Colonoscopy w/ polypectomy  last one 04-05-2008  . Knee arthroscopy w/ debridement Right 11-23-2002    and Synovectomy  . Cardiac catheterization  08-06-2004 dr Brandon Fischer    (abnormal stress test) Non-obstructive CAD, pLAD 20-30%,  LCX 30-40%, pRCA 20-30%, dRCA 40%, distal LM  mild diffuse calcifation 20-30% tapering stenosis,  preserved LVSF ef 55-60%  . Nephrolithotomy  1981  . Cholecystectomy open  1982    and NEPHROLITHOTOMY  . Cystoscopy with retrograde pyelogram, ureteroscopy and stent placement Right 03/14/2014    Procedure: CYSTO/RIGHT RETROGRADE PYELOGRAM/RIGHT URETEROSCOPY/STONE EXTRACTION/STENT PLACEMENT;  Surgeon: Brandon So, MD;  Location: [facility];  Service: Urology;  Laterality: Right;  . Holmium laser application Right 04/14/3242    Procedure: RIGHT HOLMIUM LASER APPLICATION;  Surgeon: Brandon So, MD;  Location: [facility];  Service: Urology;  Laterality: Right;  . Supraventricular tachycardia ablation N/A 04/28/2014    Procedure: SUPRAVENTRICULAR TACHYCARDIA ABLATION;  Surgeon: Brandon Lance, MD;  Location: [facility] CATH LAB;  Service: Cardiovascular;  Laterality: N/A;     Family History  Problem Relation Age of Onset  . Heart attack Father   . Heart attack Brother      History   Social History  . Marital Status: Married    Spouse Name: N/A  . Number of Children: 2  . Years of  Education: N/A   Occupational History  . postal service      retired   Social History Main Topics  . Smoking status: Former Smoker -- 1.00 packs/day for 40 years    Types: Cigarettes    Quit date: 02/07/1999  . Smokeless tobacco: Never Used  . Alcohol Use: No  . Drug Use: No  . Sexual Activity: Not on file   Other Topics Concern  . Not on file   Social History Narrative     BP 126/58 mmHg  Pulse 77  Ht 5\' 11"  (1.803 m)  Wt 200 lb 6.4 oz (90.901 kg)  BMI 27.96 kg/m2  Physical Exam:  Well appearing 81 yo man, NAD HEENT: Unremarkable Neck:  No JVD, no thyromegally Lymphatics:  No adenopathy Back:  No CVA tenderness Lungs:  Clear with no wheezes HEART:  Regular rate rhythm, no murmurs, no rubs, no clicks Abd:  soft, positive bowel sounds, no organomegally, no rebound, no guarding Ext:  2 plus pulses, no edema, no cyanosis, no clubbing Skin:  No rashes no nodules Neuro:  CN II through XII intact, motor grossly intact   Assess/Plan:

## 2014-05-25 NOTE — Assessment & Plan Note (Signed)
He is doing well after ablation of his SVT. He will remain off of his beta blocker and will call us if he has more problems.

## 2014-05-25 NOTE — Assessment & Plan Note (Signed)
His blood pressure is well controlled. No change in medications. He will maintain a low sodium diet.

## 2014-05-25 NOTE — Patient Instructions (Signed)
Medication Instructions:  Your physician recommends that you continue on your current medications as directed. Please refer to the Current Medication list given to you today.   Labwork: NONE  Testing/Procedures: NONE  Follow-Up: Follow up with Dr. Taylor as needed.  Any Other Special Instructions Will Be Listed Below (If Applicable).   

## 2014-07-24 DIAGNOSIS — Z8679 Personal history of other diseases of the circulatory system: Secondary | ICD-10-CM | POA: Diagnosis not present

## 2014-07-24 DIAGNOSIS — Z9889 Other specified postprocedural states: Secondary | ICD-10-CM | POA: Diagnosis not present

## 2014-07-24 DIAGNOSIS — I471 Supraventricular tachycardia: Secondary | ICD-10-CM | POA: Diagnosis not present

## 2014-07-24 DIAGNOSIS — I6522 Occlusion and stenosis of left carotid artery: Secondary | ICD-10-CM | POA: Diagnosis not present

## 2014-08-08 ENCOUNTER — Other Ambulatory Visit: Payer: Self-pay | Admitting: Physician Assistant

## 2014-08-08 DIAGNOSIS — L309 Dermatitis, unspecified: Secondary | ICD-10-CM | POA: Diagnosis not present

## 2014-08-08 DIAGNOSIS — C44311 Basal cell carcinoma of skin of nose: Secondary | ICD-10-CM | POA: Diagnosis not present

## 2014-09-21 ENCOUNTER — Other Ambulatory Visit: Payer: Self-pay | Admitting: Physician Assistant

## 2014-09-21 DIAGNOSIS — L814 Other melanin hyperpigmentation: Secondary | ICD-10-CM | POA: Diagnosis not present

## 2014-09-21 DIAGNOSIS — Z85820 Personal history of malignant melanoma of skin: Secondary | ICD-10-CM | POA: Diagnosis not present

## 2014-09-21 DIAGNOSIS — D225 Melanocytic nevi of trunk: Secondary | ICD-10-CM | POA: Diagnosis not present

## 2014-09-21 DIAGNOSIS — Z08 Encounter for follow-up examination after completed treatment for malignant neoplasm: Secondary | ICD-10-CM | POA: Diagnosis not present

## 2014-09-21 DIAGNOSIS — C44619 Basal cell carcinoma of skin of left upper limb, including shoulder: Secondary | ICD-10-CM | POA: Diagnosis not present

## 2014-10-02 DIAGNOSIS — E780 Pure hypercholesterolemia: Secondary | ICD-10-CM | POA: Diagnosis not present

## 2014-10-05 DIAGNOSIS — R748 Abnormal levels of other serum enzymes: Secondary | ICD-10-CM | POA: Diagnosis not present

## 2014-10-05 DIAGNOSIS — E780 Pure hypercholesterolemia: Secondary | ICD-10-CM | POA: Diagnosis not present

## 2014-10-11 DIAGNOSIS — N200 Calculus of kidney: Secondary | ICD-10-CM | POA: Diagnosis not present

## 2014-10-27 DIAGNOSIS — Z23 Encounter for immunization: Secondary | ICD-10-CM | POA: Diagnosis not present

## 2014-11-02 DIAGNOSIS — R0989 Other specified symptoms and signs involving the circulatory and respiratory systems: Secondary | ICD-10-CM | POA: Diagnosis not present

## 2014-11-02 DIAGNOSIS — Z85828 Personal history of other malignant neoplasm of skin: Secondary | ICD-10-CM | POA: Diagnosis not present

## 2014-11-02 DIAGNOSIS — Z08 Encounter for follow-up examination after completed treatment for malignant neoplasm: Secondary | ICD-10-CM | POA: Diagnosis not present

## 2014-11-02 DIAGNOSIS — I6522 Occlusion and stenosis of left carotid artery: Secondary | ICD-10-CM | POA: Diagnosis not present

## 2014-11-09 ENCOUNTER — Other Ambulatory Visit: Payer: Self-pay | Admitting: Internal Medicine

## 2014-11-09 DIAGNOSIS — R748 Abnormal levels of other serum enzymes: Secondary | ICD-10-CM

## 2014-11-16 ENCOUNTER — Ambulatory Visit
Admission: RE | Admit: 2014-11-16 | Discharge: 2014-11-16 | Disposition: A | Discharge status: Home / Self Care | Payer: Medicare Other | Source: Ambulatory Visit | Attending: Internal Medicine | Admitting: Internal Medicine

## 2014-11-16 DIAGNOSIS — K760 Fatty (change of) liver, not elsewhere classified: Secondary | ICD-10-CM | POA: Diagnosis not present

## 2014-11-16 DIAGNOSIS — R748 Abnormal levels of other serum enzymes: Secondary | ICD-10-CM

## 2015-01-18 DIAGNOSIS — H2513 Age-related nuclear cataract, bilateral: Secondary | ICD-10-CM | POA: Diagnosis not present

## 2015-02-01 DIAGNOSIS — L570 Actinic keratosis: Secondary | ICD-10-CM | POA: Diagnosis not present

## 2015-02-01 DIAGNOSIS — Z85828 Personal history of other malignant neoplasm of skin: Secondary | ICD-10-CM | POA: Diagnosis not present

## 2015-02-22 DIAGNOSIS — M7062 Trochanteric bursitis, left hip: Secondary | ICD-10-CM | POA: Diagnosis not present

## 2015-02-22 DIAGNOSIS — M7061 Trochanteric bursitis, right hip: Secondary | ICD-10-CM | POA: Diagnosis not present

## 2015-02-27 DIAGNOSIS — H2512 Age-related nuclear cataract, left eye: Secondary | ICD-10-CM | POA: Diagnosis not present

## 2015-02-27 DIAGNOSIS — H25812 Combined forms of age-related cataract, left eye: Secondary | ICD-10-CM | POA: Diagnosis not present

## 2015-03-20 DIAGNOSIS — H25811 Combined forms of age-related cataract, right eye: Secondary | ICD-10-CM | POA: Diagnosis not present

## 2015-03-20 DIAGNOSIS — H5703 Miosis: Secondary | ICD-10-CM | POA: Diagnosis not present

## 2015-03-20 DIAGNOSIS — H2511 Age-related nuclear cataract, right eye: Secondary | ICD-10-CM | POA: Diagnosis not present

## 2015-04-04 DIAGNOSIS — L821 Other seborrheic keratosis: Secondary | ICD-10-CM | POA: Diagnosis not present

## 2015-04-04 DIAGNOSIS — Z85820 Personal history of malignant melanoma of skin: Secondary | ICD-10-CM | POA: Diagnosis not present

## 2015-04-04 DIAGNOSIS — Z0000 Encounter for general adult medical examination without abnormal findings: Secondary | ICD-10-CM | POA: Diagnosis not present

## 2015-04-04 DIAGNOSIS — I10 Essential (primary) hypertension: Secondary | ICD-10-CM | POA: Diagnosis not present

## 2015-04-04 DIAGNOSIS — Z125 Encounter for screening for malignant neoplasm of prostate: Secondary | ICD-10-CM | POA: Diagnosis not present

## 2015-04-04 DIAGNOSIS — L812 Freckles: Secondary | ICD-10-CM | POA: Diagnosis not present

## 2015-04-04 DIAGNOSIS — E7800 Pure hypercholesterolemia, unspecified: Secondary | ICD-10-CM | POA: Diagnosis not present

## 2015-04-11 DIAGNOSIS — Z7982 Long term (current) use of aspirin: Secondary | ICD-10-CM | POA: Diagnosis not present

## 2015-04-11 DIAGNOSIS — I440 Atrioventricular block, first degree: Secondary | ICD-10-CM | POA: Diagnosis not present

## 2015-04-11 DIAGNOSIS — H9192 Unspecified hearing loss, left ear: Secondary | ICD-10-CM | POA: Diagnosis not present

## 2015-04-11 DIAGNOSIS — Z1212 Encounter for screening for malignant neoplasm of rectum: Secondary | ICD-10-CM | POA: Diagnosis not present

## 2015-04-11 DIAGNOSIS — E7800 Pure hypercholesterolemia, unspecified: Secondary | ICD-10-CM | POA: Diagnosis not present

## 2015-04-19 DIAGNOSIS — M7061 Trochanteric bursitis, right hip: Secondary | ICD-10-CM | POA: Diagnosis not present

## 2015-04-19 DIAGNOSIS — M25551 Pain in right hip: Secondary | ICD-10-CM | POA: Diagnosis not present

## 2015-09-13 DIAGNOSIS — M7061 Trochanteric bursitis, right hip: Secondary | ICD-10-CM | POA: Diagnosis not present

## 2015-09-13 DIAGNOSIS — M25551 Pain in right hip: Secondary | ICD-10-CM | POA: Diagnosis not present

## 2015-09-22 DIAGNOSIS — M7061 Trochanteric bursitis, right hip: Secondary | ICD-10-CM | POA: Diagnosis not present

## 2015-10-03 DIAGNOSIS — M7061 Trochanteric bursitis, right hip: Secondary | ICD-10-CM | POA: Diagnosis not present

## 2015-10-03 DIAGNOSIS — M25551 Pain in right hip: Secondary | ICD-10-CM | POA: Diagnosis not present

## 2015-10-17 DIAGNOSIS — D1801 Hemangioma of skin and subcutaneous tissue: Secondary | ICD-10-CM | POA: Diagnosis not present

## 2015-10-17 DIAGNOSIS — Z85820 Personal history of malignant melanoma of skin: Secondary | ICD-10-CM | POA: Diagnosis not present

## 2015-10-17 DIAGNOSIS — L821 Other seborrheic keratosis: Secondary | ICD-10-CM | POA: Diagnosis not present

## 2015-11-14 DIAGNOSIS — M7061 Trochanteric bursitis, right hip: Secondary | ICD-10-CM | POA: Diagnosis not present

## 2015-11-14 DIAGNOSIS — M25551 Pain in right hip: Secondary | ICD-10-CM | POA: Diagnosis not present

## 2016-04-15 DIAGNOSIS — Z0000 Encounter for general adult medical examination without abnormal findings: Secondary | ICD-10-CM | POA: Diagnosis not present

## 2016-04-15 DIAGNOSIS — E7800 Pure hypercholesterolemia, unspecified: Secondary | ICD-10-CM | POA: Diagnosis not present

## 2016-04-15 DIAGNOSIS — I10 Essential (primary) hypertension: Secondary | ICD-10-CM | POA: Diagnosis not present

## 2016-04-16 DIAGNOSIS — L814 Other melanin hyperpigmentation: Secondary | ICD-10-CM | POA: Diagnosis not present

## 2016-04-16 DIAGNOSIS — Z85820 Personal history of malignant melanoma of skin: Secondary | ICD-10-CM | POA: Diagnosis not present

## 2016-04-16 DIAGNOSIS — L821 Other seborrheic keratosis: Secondary | ICD-10-CM | POA: Diagnosis not present

## 2016-04-16 DIAGNOSIS — D235 Other benign neoplasm of skin of trunk: Secondary | ICD-10-CM | POA: Diagnosis not present

## 2016-04-18 DIAGNOSIS — D649 Anemia, unspecified: Secondary | ICD-10-CM | POA: Diagnosis not present

## 2016-04-18 DIAGNOSIS — I10 Essential (primary) hypertension: Secondary | ICD-10-CM | POA: Diagnosis not present

## 2016-04-18 DIAGNOSIS — R748 Abnormal levels of other serum enzymes: Secondary | ICD-10-CM | POA: Diagnosis not present

## 2016-04-18 DIAGNOSIS — K219 Gastro-esophageal reflux disease without esophagitis: Secondary | ICD-10-CM | POA: Diagnosis not present

## 2016-04-18 DIAGNOSIS — N400 Enlarged prostate without lower urinary tract symptoms: Secondary | ICD-10-CM | POA: Diagnosis not present

## 2016-04-23 DIAGNOSIS — Z1212 Encounter for screening for malignant neoplasm of rectum: Secondary | ICD-10-CM | POA: Diagnosis not present

## 2016-04-24 DIAGNOSIS — Z961 Presence of intraocular lens: Secondary | ICD-10-CM | POA: Diagnosis not present

## 2016-04-24 DIAGNOSIS — H52203 Unspecified astigmatism, bilateral: Secondary | ICD-10-CM | POA: Diagnosis not present

## 2016-05-02 DIAGNOSIS — G8929 Other chronic pain: Secondary | ICD-10-CM | POA: Diagnosis not present

## 2016-05-02 DIAGNOSIS — M7061 Trochanteric bursitis, right hip: Secondary | ICD-10-CM | POA: Diagnosis not present

## 2016-05-02 DIAGNOSIS — M25551 Pain in right hip: Secondary | ICD-10-CM | POA: Diagnosis not present

## 2016-05-14 DIAGNOSIS — I10 Essential (primary) hypertension: Secondary | ICD-10-CM | POA: Diagnosis not present

## 2016-07-18 DIAGNOSIS — M7061 Trochanteric bursitis, right hip: Secondary | ICD-10-CM | POA: Diagnosis not present

## 2016-07-18 DIAGNOSIS — G8929 Other chronic pain: Secondary | ICD-10-CM | POA: Diagnosis not present

## 2016-07-18 DIAGNOSIS — M25551 Pain in right hip: Secondary | ICD-10-CM | POA: Diagnosis not present

## 2016-07-25 DIAGNOSIS — M25551 Pain in right hip: Secondary | ICD-10-CM | POA: Diagnosis not present

## 2016-07-25 DIAGNOSIS — M7061 Trochanteric bursitis, right hip: Secondary | ICD-10-CM | POA: Diagnosis not present

## 2016-07-28 DIAGNOSIS — M25551 Pain in right hip: Secondary | ICD-10-CM | POA: Diagnosis not present

## 2016-07-28 DIAGNOSIS — M7061 Trochanteric bursitis, right hip: Secondary | ICD-10-CM | POA: Diagnosis not present

## 2016-08-01 DIAGNOSIS — M25551 Pain in right hip: Secondary | ICD-10-CM | POA: Diagnosis not present

## 2016-08-01 DIAGNOSIS — M7061 Trochanteric bursitis, right hip: Secondary | ICD-10-CM | POA: Diagnosis not present

## 2016-08-05 DIAGNOSIS — M7061 Trochanteric bursitis, right hip: Secondary | ICD-10-CM | POA: Diagnosis not present

## 2016-08-05 DIAGNOSIS — M25551 Pain in right hip: Secondary | ICD-10-CM | POA: Diagnosis not present

## 2016-08-06 DIAGNOSIS — N200 Calculus of kidney: Secondary | ICD-10-CM | POA: Diagnosis not present

## 2016-08-08 DIAGNOSIS — M7061 Trochanteric bursitis, right hip: Secondary | ICD-10-CM | POA: Diagnosis not present

## 2016-08-08 DIAGNOSIS — M25551 Pain in right hip: Secondary | ICD-10-CM | POA: Diagnosis not present

## 2016-08-12 DIAGNOSIS — M7061 Trochanteric bursitis, right hip: Secondary | ICD-10-CM | POA: Diagnosis not present

## 2016-08-12 DIAGNOSIS — M25551 Pain in right hip: Secondary | ICD-10-CM | POA: Diagnosis not present

## 2016-08-14 DIAGNOSIS — M25551 Pain in right hip: Secondary | ICD-10-CM | POA: Diagnosis not present

## 2016-08-14 DIAGNOSIS — M7061 Trochanteric bursitis, right hip: Secondary | ICD-10-CM | POA: Diagnosis not present

## 2016-08-19 DIAGNOSIS — M7061 Trochanteric bursitis, right hip: Secondary | ICD-10-CM | POA: Diagnosis not present

## 2016-08-19 DIAGNOSIS — M25551 Pain in right hip: Secondary | ICD-10-CM | POA: Diagnosis not present

## 2016-08-26 DIAGNOSIS — M25551 Pain in right hip: Secondary | ICD-10-CM | POA: Diagnosis not present

## 2016-08-26 DIAGNOSIS — M7061 Trochanteric bursitis, right hip: Secondary | ICD-10-CM | POA: Diagnosis not present

## 2016-09-01 DIAGNOSIS — R0602 Shortness of breath: Secondary | ICD-10-CM | POA: Diagnosis not present

## 2016-09-01 DIAGNOSIS — R748 Abnormal levels of other serum enzymes: Secondary | ICD-10-CM | POA: Diagnosis not present

## 2016-09-01 DIAGNOSIS — D709 Neutropenia, unspecified: Secondary | ICD-10-CM | POA: Diagnosis not present

## 2016-09-01 DIAGNOSIS — R42 Dizziness and giddiness: Secondary | ICD-10-CM | POA: Diagnosis not present

## 2016-09-02 ENCOUNTER — Other Ambulatory Visit: Payer: Self-pay | Admitting: Internal Medicine

## 2016-09-02 DIAGNOSIS — H8110 Benign paroxysmal vertigo, unspecified ear: Secondary | ICD-10-CM | POA: Diagnosis not present

## 2016-09-02 DIAGNOSIS — R748 Abnormal levels of other serum enzymes: Secondary | ICD-10-CM

## 2016-09-03 ENCOUNTER — Telehealth: Payer: Self-pay | Admitting: Hematology

## 2016-09-03 NOTE — Telephone Encounter (Signed)
Spoke with patient regarding the addition of an appt on 8/27.

## 2016-09-04 DIAGNOSIS — N289 Disorder of kidney and ureter, unspecified: Secondary | ICD-10-CM | POA: Diagnosis not present

## 2016-09-08 ENCOUNTER — Encounter: Payer: Self-pay | Admitting: Hematology

## 2016-09-08 ENCOUNTER — Ambulatory Visit (HOSPITAL_BASED_OUTPATIENT_CLINIC_OR_DEPARTMENT_OTHER): Payer: Medicare Other | Admitting: Hematology

## 2016-09-08 ENCOUNTER — Telehealth: Payer: Self-pay | Admitting: Hematology

## 2016-09-08 VITALS — BP 159/55 | HR 67 | Temp 98.4°F | Resp 17 | Ht 71.0 in | Wt 200.5 lb

## 2016-09-08 DIAGNOSIS — I10 Essential (primary) hypertension: Secondary | ICD-10-CM | POA: Diagnosis not present

## 2016-09-08 DIAGNOSIS — R7402 Elevation of levels of lactic acid dehydrogenase (LDH): Secondary | ICD-10-CM

## 2016-09-08 DIAGNOSIS — D709 Neutropenia, unspecified: Secondary | ICD-10-CM

## 2016-09-08 DIAGNOSIS — Z809 Family history of malignant neoplasm, unspecified: Secondary | ICD-10-CM

## 2016-09-08 DIAGNOSIS — I4891 Unspecified atrial fibrillation: Secondary | ICD-10-CM

## 2016-09-08 DIAGNOSIS — R161 Splenomegaly, not elsewhere classified: Secondary | ICD-10-CM | POA: Diagnosis not present

## 2016-09-08 DIAGNOSIS — R740 Nonspecific elevation of levels of transaminase and lactic acid dehydrogenase [LDH]: Secondary | ICD-10-CM | POA: Diagnosis not present

## 2016-09-08 DIAGNOSIS — D72819 Decreased white blood cell count, unspecified: Secondary | ICD-10-CM

## 2016-09-08 DIAGNOSIS — D649 Anemia, unspecified: Secondary | ICD-10-CM | POA: Diagnosis not present

## 2016-09-08 DIAGNOSIS — R5383 Other fatigue: Secondary | ICD-10-CM | POA: Diagnosis not present

## 2016-09-08 DIAGNOSIS — Z87891 Personal history of nicotine dependence: Secondary | ICD-10-CM | POA: Diagnosis not present

## 2016-09-08 DIAGNOSIS — D61818 Other pancytopenia: Secondary | ICD-10-CM | POA: Diagnosis not present

## 2016-09-08 DIAGNOSIS — D696 Thrombocytopenia, unspecified: Secondary | ICD-10-CM

## 2016-09-08 DIAGNOSIS — E538 Deficiency of other specified B group vitamins: Secondary | ICD-10-CM

## 2016-09-08 NOTE — Telephone Encounter (Signed)
Gave patient avs and calendar.   °

## 2016-09-08 NOTE — Progress Notes (Signed)
[name]    HEMATOLOGY/ONCOLOGY CONSULTATION NOTE  Date of Service: 09/08/2016  Patient Care Team: Deland Pretty, MD as PCP - General (Internal Medicine)  CHIEF COMPLAINTS/PURPOSE OF CONSULTATION:  Anemia and leucopenia  HISTORY OF PRESENTING ILLNESS:   Brandon Fischer is a wonderful 83 y.o. male  who has been referred to [place] by Dr .Deland Pretty, MD for evaluation and management of anemia and leukopenia.  Patient has a history of hypertension, dyslipidemia, atrial fibrillation, coronary artery disease, bilateral carotid artery stenosis, paroxysmal supraventricular tachycardias status post radiofrequency ablation, GERD who on recent labs with his primary care physician on 09/01/2016 which showed new anemia with a hemoglobin of 9.3 with an MCV of 82.9 and RDW of 18.5. WBC count of 2.1k with an ANC of about 700 and mild thrombocytopenia of 119k.  CMP showed bilirubin of 1.7 with an alkaline phosphatase of 154 normal AST ALT creatinine 1.9 with BUN of 24.  Patient subsequently had an ultrasound of the abdomen done on 09/09/2016 for evaluation of his abnormal liver function tests and renal insufficiency and was noted to have severe splenomegaly with this plane off 18 cm in size with a volume of 1407 mL. No focal hepatic abnormalities. No bladder distention.. 3.7 cm abdominal aortic aneurysm.  Repeat labs done in clinic today show a slight improvement in WBC count of 3.6k with an ANC of 1000. Hemoglobin 9.4 and platelets of 108k . Patient has a obvious deep palpable spleen on clinical examination. Overtly palpable lymphadenopathy noted.  Patient notes no overt weight loss fevers chills or night sweats.  No change in mental status. No chest pain no shortness of breath no significant abdominal discomfort headaches. No evidence of bleeding. No issues with recent infections. Some fatigue.  MEDICAL HISTORY:  Past Medical History:  Diagnosis Date  . At risk for sleep apnea    STOP-BANG= 4    SENT  TO PCP 02-06-2014  . Bilateral carotid artery stenosis without cerebral infarction    ASYMPTOMATIC--  BILATERAL ICA  50-69%  PER CARDIOLOGIST NOTE, DR Einar Gip  . Bilateral carotid bruits    LEFT > RIGHT  . Coronary artery disease cardiologist-- dr Stevphen [place] CAD  per cath 08-06-2004 HX PALPITATIONS-AND -TACHYCARDIA-  . First degree heart block   . Full dentures   . GERD (gastroesophageal reflux disease)   . Heart palpitations    Not any more  . History of atrial fibrillation without current medication    EPISODE OF AFIB WITH RVR INTRAOPERATIVELY 02-09-2014 AT [facility]--  RESOLVED AND PT FOLLOWED UP WITH CARDIOLOGIST  DR Einar Gip  . History of kidney stones   . Hyperlipidemia   . Hypertension   . Nephrolithiasis    RIGHT  . Organic impotence   . Psoriasis    Clearing up  . PSVT (paroxysmal supraventricular tachycardia) ([place])   . Right ureteral calculus   . S/P radiofrequency ablation operation for arrhythmia-SVT 04/28/2014  . Simple renal cyst    right  . Wears glasses   . Wears hearing aid    BILATERAL  History of malignant melanoma status post Mohs surgery to remove the skin lesion on the face with skin grafting. 3 years ago.  SURGICAL HISTORY: Past Surgical History:  Procedure Laterality Date  . CARDIAC CATHETERIZATION  08-06-2004 dr Verlon Setting   (abnormal stress test) Non-obstructive CAD, pLAD 20-30%,  LCX 30-40%, pRCA 20-30%, dRCA 40%, distal LM  mild diffuse calcifation 20-30% tapering stenosis,  preserved LVSF ef 55-60%  . CARPAL TUNNEL  RELEASE Bilateral 2005  . CATARACT EXTRACTION, BILATERAL Bilateral 2011  . CHOLECYSTECTOMY OPEN  1982   and NEPHROLITHOTOMY  . COLONOSCOPY W/ POLYPECTOMY  last one 04-05-2008  . CYSTOSCOPY WITH RETROGRADE PYELOGRAM, URETEROSCOPY AND STENT PLACEMENT Right 03/14/2014   Procedure: CYSTO/RIGHT RETROGRADE PYELOGRAM/RIGHT URETEROSCOPY/STONE EXTRACTION/STENT PLACEMENT;  Surgeon: Malka So, MD;  Location: [facility];  Service:  Urology;  Laterality: Right;  . HOLMIUM LASER APPLICATION Right 05/21/5636   Procedure: RIGHT HOLMIUM LASER APPLICATION;  Surgeon: Malka So, MD;  Location: [facility];  Service: Urology;  Laterality: Right;  . KNEE ARTHROSCOPY W/ DEBRIDEMENT Right 11-23-2002   and Synovectomy  . NEPHROLITHOTOMY  01/16/2011   Procedure: NEPHROLITHOTOMY PERCUTANEOUS;  Surgeon: Malka So;  Location: [facility];  Service: Urology;  Laterality: Left;  C-Arm  Holmium Laser  . NEPHROLITHOTOMY  1981  . SUPRAVENTRICULAR TACHYCARDIA ABLATION N/A 04/28/2014   Procedure: SUPRAVENTRICULAR TACHYCARDIA ABLATION;  Surgeon: Evans Lance, MD;  Location: [facility] CATH LAB;  Service: Cardiovascular;  Laterality: N/A;    SOCIAL HISTORY: Social History   Social History  . Marital status: Married    Spouse name: N/A  . Number of children: 2  . Years of education: N/A   Occupational History  . postal service      retired   Social History Main Topics  . Smoking status: Former Smoker    Packs/day: 1.00    Years: 40.00    Types: Cigarettes    Quit date: 02/06/1998  . Smokeless tobacco: Never Used  . Alcohol use No  . Drug use: No  . Sexual activity: Not on file   Other Topics Concern  . Not on file   Social History Narrative  . No narrative on file    FAMILY HISTORY: Family History  Problem Relation Age of Onset  . Cancer Mother        Ovarian or colon  . Heart attack Father   . Heart attack Brother     ALLERGIES:  has No Known Allergies.  MEDICATIONS:  Current Outpatient Prescriptions  Medication Sig Dispense Refill  . atorvastatin (LIPITOR) 20 MG tablet Take 20 mg by mouth daily.    [name] losartan (COZAAR) 50 MG tablet Take 50 mg by mouth daily.    [name] aspirin 81 MG tablet Take 81 mg by mouth every other day.     . diphenhydramine-acetaminophen (TYLENOL PM) 25-500 MG TABS Take 2 tablets by mouth at bedtime.    . pantoprazole (PROTONIX) 40 MG tablet Take 40 mg by mouth every morning.     .  traMADol (ULTRAM) 50 MG tablet Take 50 mg by mouth every 6 (six) hours as needed for moderate pain.      No current facility-administered medications for this visit.     REVIEW OF SYSTEMS:    10 Point review of Systems was done is negative except as noted above.  PHYSICAL EXAMINATION: ECOG PERFORMANCE STATUS: 2 - Symptomatic, <50% confined to bed  . Vitals:   09/08/16 1513  BP: (!) 159/55  Pulse: 67  Resp: 17  Temp: 98.4 F (36.9 C)  SpO2: 100%   Filed Weights   09/08/16 1513  Weight: 200 lb 8 oz (90.9 kg)   .Body mass index is 27.96 kg/m.  GENERAL:alert, in no acute distress and comfortable SKIN: no acute rashes, no significant lesions EYES: conjunctiva are pink and non-injected, sclera anicteric OROPHARYNX: MMM, no exudates, no oropharyngeal erythema or ulceration NECK: supple, no JVD LYMPH:  no palpable lymphadenopathy in the cervical, axillary or inguinal regions LUNGS: clear to auscultation b/l with normal respiratory effort HEART: regular rate & rhythm ABDOMEN:  normoactive bowel sounds , palpable splenomegaly Extremity: no pedal edema PSYCH: alert & oriented x 3 with fluent speech NEURO: no focal motor/sensory deficits  LABORATORY DATA:  I have reviewed the data as listed  . CBC Latest Ref Rng & Units 09/09/2016 09/09/2016 04/21/2014  WBC 4.0 - 10.3 10e3/uL 3.6(L) - 6.9  Hemoglobin 13.0 - 17.1 g/dL 9.4(L) - 11.2(L)  Hematocrit 37.5 - 51.0 % 29.2(L) 28.0(L) 32.8(L)  Platelets 140 - 400 10e3/uL 108(L) - 199.0   ANC 1000  CBC    Component Value Date/Time   WBC 3.6 (L) 09/09/2016 1025   WBC 6.9 04/21/2014 0959   RBC 3.54 (L) 09/09/2016 1025   RBC 4.05 (L) 04/21/2014 0959   HGB 9.4 (L) 09/09/2016 1025   HCT 29.2 (L) 09/09/2016 1025   HCT 28.0 (L) 09/09/2016 1025   PLT 108 (L) 09/09/2016 1025   MCV 82.5 09/09/2016 1025   MCH 26.6 (L) 09/09/2016 1025   MCH 28.6 01/15/2011 1115   MCHC 32.2 09/09/2016 1025   MCHC 34.3 04/21/2014 0959   RDW 18.4 (H)  09/09/2016 1025   LYMPHSABS 2.3 09/09/2016 1025   MONOABS 0.2 09/09/2016 1025   EOSABS 0.0 09/09/2016 1025   BASOSABS 0.0 09/09/2016 1025     . CMP Latest Ref Rng & Units 09/09/2016 09/09/2016 04/21/2014  Glucose 70 - 140 mg/dl 146(H) - 145(H)  BUN 7.0 - 26.0 mg/dL 16.8 - 17  Creatinine 0.7 - 1.3 mg/dL 1.6(H) - 1.32  Sodium 136 - 145 mEq/L 141 - 139  Potassium 3.5 - 5.1 mEq/L 3.5 - 3.9  Chloride 96 - 112 mEq/L - - 107  CO2 22 - 29 mEq/L 23 - 26  Calcium 8.4 - 10.4 mg/dL 8.6 - 9.0  Total Protein 6.0 - 8.5 g/dL 6.2(L) 6.1 -  Total Bilirubin 0.20 - 1.20 mg/dL 1.91(H) - -  Alkaline Phos 40 - 150 U/L 145 - -  AST 5 - 34 U/L 36(H) - -  ALT 0 - 55 U/L 33 - -   Component     Latest Ref Rng & Units 09/09/2016  IgG (Immunoglobin G), Serum     700 - 1,600 mg/dL 917  IgA/Immunoglobulin A, Serum     61 - 437 mg/dL 203  IgM, Qn, Serum     15 - 143 mg/dL 43  Total Protein     6.0 - 8.5 g/dL 6.1  Albumin SerPl Elph-Mcnc     2.9 - 4.4 g/dL 3.7  Alpha 1     0.0 - 0.4 g/dL 0.3  Alpha2 Glob SerPl Elph-Mcnc     0.4 - 1.0 g/dL 0.5  B-Globulin SerPl Elph-Mcnc     0.7 - 1.3 g/dL 0.8  Gamma Glob SerPl Elph-Mcnc     0.4 - 1.8 g/dL 0.7  M Protein SerPl Elph-Mcnc     Not Observed g/dL Not Observed  Globulin, Total     2.2 - 3.9 g/dL 2.4  Albumin/Glob SerPl     0.7 - 1.7 1.6  IFE 1      Comment  Please Note (HCV):      Comment  Iron     42 - 163 ug/dL 64  TIBC     202 - 409 ug/dL 194 (L)  UIBC     117 - 376 ug/dL 130  %SAT     20 -  55 % 33  Folate, Hemolysate     Not Estab. ng/mL 369.8  HCT     37.5 - 51.0 % 28.0 (L)  Folate, RBC     >498 ng/mL 1,321  Ig Kappa Free Light Chain     3.3 - 19.4 mg/L 21.4 (H)  Ig Lambda Free Light Chain     5.7 - 26.3 mg/L 36.6 (H)  Kappa/Lambda FluidC Ratio     0.26 - 1.65 0.58  LDH     125 - 245 U/L 326 (H)  Sed Rate     0 - 30 mm/hr 2  Vitamin B12     232 - 1245 pg/mL 1,261 (H)  TSH     0.320 - 4.118 m(IU)/L 2.609  Hep C Virus Ab      0.0 - 0.9 s/co ratio 0.1  Ferritin     22 - 316 ng/ml 399 (H)    RADIOGRAPHIC STUDIES: I have personally reviewed the radiological images as listed and agreed with the findings in the report. US Abdomen Complete  Result Date: 09/09/2016 CLINICAL DATA:  Elevated alkaline phosphatase. Renal insufficiency. Cholecystectomy. Splenomegaly. EXAM: ABDOMEN ULTRASOUND COMPLETE COMPARISON:  Ultrasound 11/16/2014. FINDINGS: Gallbladder: Cholecystectomy. Common bile duct: Diameter: 2.6 mm Liver: No focal lesion identified. Within normal limits in parenchymal echogenicity. Portal vein is patent on color Doppler imaging with normal direction of blood flow towards the liver. IVC: No abnormality visualized. Pancreas: Visualized portion unremarkable. Spleen: Splenomegaly at 18 cm with a volume of 1407 cc. Right Kidney: Length: 10.0 cm. Echogenicity within normal limits. No hydronephrosis visualized. 4.3 cm and 1.4 cm simple cyst lower pole right kidney. Left Kidney: Length: 10.0 cm. Echogenicity within normal limits. No mass or hydronephrosis visualized. Abdominal aorta: Abdominal aortic aneurysm at 3.7 cm. Other findings: None. IMPRESSION: 1. Cholecystectomy. No biliary distention. No focal hepatic abnormality identified. Liver echotexture normal. 2.  Severe splenomegaly. 3.  Simple cysts left kidney. 4.  3.7 cm abdominal aortic aneurysm. Electronically Signed   By: Marcello Moores  Register   On: 09/09/2016 12:49    ASSESSMENT & PLAN:   #1 Pancytopenia with normocytic Anemia, neutropenia and thrombocytopenia This could certainly be from the patient's splenomegaly causing hypersplenism. Etiology of the splenomegaly is unclear though in the setting of elevated LDH levels would need to rule out possibility of splenic lymphoma/splenic marginal zone lymphoma or bone marrow failure state with compensatory extramedullary hematopoiesis in the spleen. R/o myelofibrosis.  No evidence of monoclonal paraproteinemia. TSH levels  within normal limits B12, folate and iron levels within normal limits. Hepatitis C negative  #2 Elevated LDH With mild elevated bilirubin cannot r/o some hemolysis though that would not explain leucopenia/neutropenia or thrombocytopenia and splenomegaly by itself  PLAN -labs were ordered and reviewed. -will get additional labs - haptoglobin/flow cytometry, coombs test -CT guided Bone marrow biopsy to r/o PMF/MDS -PET/CT to evaluate massive splenomegaly in the absence of overt liver pathology and presence of pancytopenia with increased LDH- high index of suspicion for possible lymphoma. -  Labs tomorrow AM CT bone marrrow biopsy in 2-3 days RTC with Dr Irene Limbo in 5 days after Bone marrow biopsy and PET/CT  . Orders Placed This Encounter  Procedures  . CT Biopsy    Standing Status:   Future    Standing Expiration Date:   09/08/2017    Order Specific Question:   Lab orders requested (DO NOT place separate lab orders, these will be automatically ordered during procedure specimen collection):    Answer:  Surgical Pathology    Comments:   flow cytometry, FISH    Order Specific Question:   Reason for Exam (SYMPTOM  OR DIAGNOSIS REQUIRED)    Answer:   unilateral iliac crest Bone marrow aspiration and biopsy - new pancytopenia ? MDS    Order Specific Question:   Preferred imaging location?    Answer:   [facility]    Order Specific Question:   Radiology Contrast Protocol - do NOT remove file path    Answer:   \\[email or web address].pdf  . CT BONE MARROW BIOPSY & ASPIRATION    Standing Status:   Future    Standing Expiration Date:   12/09/2017    Order Specific Question:   Reason for Exam (SYMPTOM  OR DIAGNOSIS REQUIRED)    Answer:   unilateral iliac crest Bone marrow aspiration and biopsy - new pancytopenia ? MDS    Order Specific Question:   Preferred imaging location?    Answer:   [facility]    Order Specific Question:   Radiology Contrast Protocol -  do NOT remove file path    Answer:   \\[email or web address]  . NM PET Image Initial (PI) Skull Base To Thigh    Standing Status:   Future    Standing Expiration Date:   09/15/2017    Order Specific Question:   If indicated for the ordered procedure, I authorize the administration of a radiopharmaceutical per Radiology protocol    Answer:   Yes    Order Specific Question:   Preferred imaging location?    Answer:   [facility]    Order Specific Question:   Radiology Contrast Protocol - do NOT remove file path    Answer:   \\[email or web address]    Order Specific Question:   Reason for Exam additional comments    Answer:   Pancytopenia with massive splenomegaly r/o lymphoma  . CBC & Diff and Retic    Standing Status:   Future    Number of Occurrences:   1    Standing Expiration Date:   09/08/2017  . Comprehensive metabolic panel    Standing Status:   Future    Number of Occurrences:   1    Standing Expiration Date:   09/08/2017  . Lactate dehydrogenase    Standing Status:   Future    Number of Occurrences:   1    Standing Expiration Date:   09/08/2017  . Sedimentation rate    Standing Status:   Future    Number of Occurrences:   1    Standing Expiration Date:   09/08/2017  . Smear    Standing Status:   Future    Number of Occurrences:   1    Standing Expiration Date:   09/08/2017  . Vitamin B12    Standing Status:   Future    Number of Occurrences:   1    Standing Expiration Date:   09/08/2017  . Folate RBC    Standing Status:   Future    Number of Occurrences:   1    Standing Expiration Date:   09/08/2017  . TSH    Standing Status:   Future    Number of Occurrences:   1    Standing Expiration Date:   09/08/2017  . Hepatitis C antibody    Standing Status:   Future    Number of Occurrences:   1    Standing Expiration Date:   09/08/2017  . Ferritin  Standing Status:   Future    Number of Occurrences:   1    Standing Expiration  Date:   09/08/2017  . Iron and TIBC    Standing Status:   Future    Number of Occurrences:   1    Standing Expiration Date:   09/08/2017  . Multiple Myeloma Panel (SPEP&IFE w/QIG)    Standing Status:   Future    Number of Occurrences:   1    Standing Expiration Date:   09/08/2017  . Kappa/lambda light chains    Standing Status:   Future    Number of Occurrences:   1    Standing Expiration Date:   10/13/2017  . Flow Cytometry    Abnormal lymphocytes with massive splenomegaly r/o lymphoproliferative disorder    Standing Status:   Future    Standing Expiration Date:   09/15/2017  . CBC & Diff and Retic    Standing Status:   Future    Standing Expiration Date:   09/15/2017  . Haptoglobin    Standing Status:   Future    Standing Expiration Date:   09/15/2017  . Lactate dehydrogenase    Standing Status:   Future    Standing Expiration Date:   09/15/2017  . Direct antiglobulin test (not at [facility])    Standing Status:   Future    Standing Expiration Date:   10/20/2017    All of the patients questions were answered with apparent satisfaction. The patient knows to call the clinic with any problems, questions or concerns.  I spent 60 minutes counseling the patient face to face. The total time spent in the appointment was 80 minutes and more than 50% was on counseling and direct patient cares.    Sullivan Lone MD [place] AAHIVMS [facility] Hematology/Oncology Physician [facility]  (Office):       [phone] (Work cell):  [phone] (Fax):           [phone]  09/08/2016 3:55 PM

## 2016-09-09 ENCOUNTER — Ambulatory Visit
Admission: RE | Admit: 2016-09-09 | Discharge: 2016-09-09 | Disposition: A | Discharge status: Home / Self Care | Payer: Medicare Other | Source: Ambulatory Visit | Attending: Internal Medicine | Admitting: Internal Medicine

## 2016-09-09 ENCOUNTER — Other Ambulatory Visit (HOSPITAL_BASED_OUTPATIENT_CLINIC_OR_DEPARTMENT_OTHER): Payer: Medicare Other

## 2016-09-09 DIAGNOSIS — R161 Splenomegaly, not elsewhere classified: Secondary | ICD-10-CM | POA: Diagnosis not present

## 2016-09-09 DIAGNOSIS — E538 Deficiency of other specified B group vitamins: Secondary | ICD-10-CM

## 2016-09-09 DIAGNOSIS — R5383 Other fatigue: Secondary | ICD-10-CM

## 2016-09-09 DIAGNOSIS — D61818 Other pancytopenia: Secondary | ICD-10-CM | POA: Diagnosis not present

## 2016-09-09 DIAGNOSIS — D649 Anemia, unspecified: Secondary | ICD-10-CM

## 2016-09-09 DIAGNOSIS — R748 Abnormal levels of other serum enzymes: Secondary | ICD-10-CM

## 2016-09-09 LAB — CBC & DIFF AND RETIC
BASO%: 0.6 % (ref 0.0–2.0)
BASOS ABS: 0 10*3/uL (ref 0.0–0.1)
EOS%: 0 % (ref 0.0–7.0)
Eosinophils Absolute: 0 10*3/uL (ref 0.0–0.5)
HEMATOCRIT: 29.2 % — AB (ref 38.4–49.9)
HGB: 9.4 g/dL — ABNORMAL LOW (ref 13.0–17.1)
Immature Retic Fract: 11.5 % — ABNORMAL HIGH (ref 3.00–10.60)
LYMPH#: 2.3 10*3/uL (ref 0.9–3.3)
LYMPH%: 64.2 % — ABNORMAL HIGH (ref 14.0–49.0)
MCH: 26.6 pg — ABNORMAL LOW (ref 27.2–33.4)
MCHC: 32.2 g/dL (ref 32.0–36.0)
MCV: 82.5 fL (ref 79.3–98.0)
MONO#: 0.2 10*3/uL (ref 0.1–0.9)
MONO%: 6.4 % (ref 0.0–14.0)
NEUT#: 1 10*3/uL — ABNORMAL LOW (ref 1.5–6.5)
NEUT%: 28.8 % — AB (ref 39.0–75.0)
Platelets: 108 10*3/uL — ABNORMAL LOW (ref 140–400)
RBC: 3.54 10*6/uL — AB (ref 4.20–5.82)
RDW: 18.4 % — ABNORMAL HIGH (ref 11.0–14.6)
RETIC CT ABS: 157.53 10*3/uL — AB (ref 34.80–93.90)
Retic %: 4.45 % — ABNORMAL HIGH (ref 0.80–1.80)
WBC: 3.6 10*3/uL — ABNORMAL LOW (ref 4.0–10.3)

## 2016-09-09 LAB — IRON AND TIBC
%SAT: 33 % (ref 20–55)
IRON: 64 ug/dL (ref 42–163)
TIBC: 194 ug/dL — ABNORMAL LOW (ref 202–409)
UIBC: 130 ug/dL (ref 117–376)

## 2016-09-09 LAB — COMPREHENSIVE METABOLIC PANEL
ALT: 33 U/L (ref 0–55)
AST: 36 U/L — AB (ref 5–34)
Albumin: 3.5 g/dL (ref 3.5–5.0)
Alkaline Phosphatase: 145 U/L (ref 40–150)
Anion Gap: 7 mEq/L (ref 3–11)
BILIRUBIN TOTAL: 1.91 mg/dL — AB (ref 0.20–1.20)
BUN: 16.8 mg/dL (ref 7.0–26.0)
CALCIUM: 8.6 mg/dL (ref 8.4–10.4)
CHLORIDE: 110 meq/L — AB (ref 98–109)
CO2: 23 meq/L (ref 22–29)
Creatinine: 1.6 mg/dL — ABNORMAL HIGH (ref 0.7–1.3)
EGFR: 40 mL/min/{1.73_m2} — ABNORMAL LOW (ref >90)
Glucose: 146 mg/dl — ABNORMAL HIGH (ref 70–140)
Potassium: 3.5 mEq/L (ref 3.5–5.1)
Sodium: 141 mEq/L (ref 136–145)
Total Protein: 6.2 g/dL — ABNORMAL LOW (ref 6.4–8.3)

## 2016-09-09 LAB — LACTATE DEHYDROGENASE: LDH: 326 U/L — ABNORMAL HIGH (ref 125–245)

## 2016-09-09 LAB — TSH: TSH: 2.609 m(IU)/L (ref 0.320–4.118)

## 2016-09-09 LAB — FERRITIN: Ferritin: 399 ng/ml — ABNORMAL HIGH (ref 22–316)

## 2016-09-09 LAB — CHCC SMEAR

## 2016-09-10 DIAGNOSIS — M7061 Trochanteric bursitis, right hip: Secondary | ICD-10-CM | POA: Diagnosis not present

## 2016-09-10 DIAGNOSIS — S76011D Strain of muscle, fascia and tendon of right hip, subsequent encounter: Secondary | ICD-10-CM | POA: Diagnosis not present

## 2016-09-10 DIAGNOSIS — M25551 Pain in right hip: Secondary | ICD-10-CM | POA: Diagnosis not present

## 2016-09-10 LAB — KAPPA/LAMBDA LIGHT CHAINS
IG LAMBDA FREE LIGHT CHAIN: 36.6 mg/L — AB (ref 5.7–26.3)
Ig Kappa Free Light Chain: 21.4 mg/L — ABNORMAL HIGH (ref 3.3–19.4)
Kappa/Lambda FluidC Ratio: 0.58 (ref 0.26–1.65)

## 2016-09-10 LAB — FOLATE RBC
Folate, Hemolysate: 369.8 ng/mL
Folate, RBC: 1321 ng/mL (ref >498)
HEMATOCRIT: 28 % — AB (ref 37.5–51.0)

## 2016-09-10 LAB — SEDIMENTATION RATE: SED RATE: 2 mm/h (ref 0–30)

## 2016-09-10 LAB — HEPATITIS C ANTIBODY: Hep C Virus Ab: 0.1 s/co ratio (ref 0.0–0.9)

## 2016-09-10 LAB — VITAMIN B12

## 2016-09-11 DIAGNOSIS — I10 Essential (primary) hypertension: Secondary | ICD-10-CM | POA: Diagnosis not present

## 2016-09-11 LAB — MULTIPLE MYELOMA PANEL, SERUM
ALBUMIN/GLOB SERPL: 1.6 (ref 0.7–1.7)
Albumin SerPl Elph-Mcnc: 3.7 g/dL (ref 2.9–4.4)
Alpha 1: 0.3 g/dL (ref 0.0–0.4)
Alpha2 Glob SerPl Elph-Mcnc: 0.5 g/dL (ref 0.4–1.0)
B-Globulin SerPl Elph-Mcnc: 0.8 g/dL (ref 0.7–1.3)
Gamma Glob SerPl Elph-Mcnc: 0.7 g/dL (ref 0.4–1.8)
Globulin, Total: 2.4 g/dL (ref 2.2–3.9)
IGA/IMMUNOGLOBULIN A, SERUM: 203 mg/dL (ref 61–437)
IgG, Qn, Serum: 917 mg/dL (ref 700–1600)
IgM, Qn, Serum: 43 mg/dL (ref 15–143)
Total Protein: 6.1 g/dL (ref 6.0–8.5)

## 2016-09-17 ENCOUNTER — Ambulatory Visit (HOSPITAL_BASED_OUTPATIENT_CLINIC_OR_DEPARTMENT_OTHER): Payer: Medicare Other

## 2016-09-17 ENCOUNTER — Telehealth: Payer: Self-pay | Admitting: Hematology

## 2016-09-17 ENCOUNTER — Other Ambulatory Visit: Payer: Self-pay | Admitting: Radiology

## 2016-09-17 ENCOUNTER — Encounter: Payer: Self-pay | Admitting: Hematology

## 2016-09-17 ENCOUNTER — Ambulatory Visit (HOSPITAL_BASED_OUTPATIENT_CLINIC_OR_DEPARTMENT_OTHER): Payer: Medicare Other | Admitting: Hematology

## 2016-09-17 VITALS — BP 146/67 | HR 76 | Temp 98.2°F | Resp 18 | Ht 71.0 in | Wt 203.3 lb

## 2016-09-17 DIAGNOSIS — R7402 Elevation of levels of lactic acid dehydrogenase (LDH): Secondary | ICD-10-CM

## 2016-09-17 DIAGNOSIS — R161 Splenomegaly, not elsewhere classified: Secondary | ICD-10-CM | POA: Diagnosis not present

## 2016-09-17 DIAGNOSIS — D649 Anemia, unspecified: Secondary | ICD-10-CM

## 2016-09-17 DIAGNOSIS — D61818 Other pancytopenia: Secondary | ICD-10-CM | POA: Diagnosis not present

## 2016-09-17 DIAGNOSIS — R740 Nonspecific elevation of levels of transaminase and lactic acid dehydrogenase [LDH]: Secondary | ICD-10-CM

## 2016-09-17 DIAGNOSIS — D709 Neutropenia, unspecified: Secondary | ICD-10-CM

## 2016-09-17 DIAGNOSIS — D696 Thrombocytopenia, unspecified: Secondary | ICD-10-CM | POA: Diagnosis not present

## 2016-09-17 DIAGNOSIS — D72819 Decreased white blood cell count, unspecified: Secondary | ICD-10-CM

## 2016-09-17 LAB — COMPREHENSIVE METABOLIC PANEL
ALBUMIN: 3.4 g/dL — AB (ref 3.5–5.0)
ALK PHOS: 142 U/L (ref 40–150)
ALT: 21 U/L (ref 0–55)
AST: 24 U/L (ref 5–34)
Anion Gap: 9 mEq/L (ref 3–11)
BUN: 17 mg/dL (ref 7.0–26.0)
CALCIUM: 8.5 mg/dL (ref 8.4–10.4)
CO2: 22 mEq/L (ref 22–29)
Chloride: 111 mEq/L — ABNORMAL HIGH (ref 98–109)
Creatinine: 1.6 mg/dL — ABNORMAL HIGH (ref 0.7–1.3)
EGFR: 41 mL/min/{1.73_m2} — ABNORMAL LOW (ref >90)
GLUCOSE: 90 mg/dL (ref 70–140)
Potassium: 4 mEq/L (ref 3.5–5.1)
SODIUM: 141 meq/L (ref 136–145)
TOTAL PROTEIN: 6 g/dL — AB (ref 6.4–8.3)
Total Bilirubin: 1.71 mg/dL — ABNORMAL HIGH (ref 0.20–1.20)

## 2016-09-17 LAB — CBC & DIFF AND RETIC
BASO%: 0.4 % (ref 0.0–2.0)
BASOS ABS: 0 10*3/uL (ref 0.0–0.1)
EOS ABS: 0 10*3/uL (ref 0.0–0.5)
EOS%: 0 % (ref 0.0–7.0)
HCT: 28.3 % — ABNORMAL LOW (ref 38.4–49.9)
HEMOGLOBIN: 9.2 g/dL — AB (ref 13.0–17.1)
IMMATURE RETIC FRACT: 5.9 % (ref 3.00–10.60)
LYMPH%: 71.3 % — AB (ref 14.0–49.0)
MCH: 26.7 pg — ABNORMAL LOW (ref 27.2–33.4)
MCHC: 32.5 g/dL (ref 32.0–36.0)
MCV: 82.3 fL (ref 79.3–98.0)
MONO#: 0.2 10*3/uL (ref 0.1–0.9)
MONO%: 7.3 % (ref 0.0–14.0)
NEUT%: 21 % — ABNORMAL LOW (ref 39.0–75.0)
NEUTROS ABS: 0.5 10*3/uL — AB (ref 1.5–6.5)
Platelets: 107 10*3/uL — ABNORMAL LOW (ref 140–400)
RBC: 3.44 10*6/uL — ABNORMAL LOW (ref 4.20–5.82)
RDW: 18.5 % — ABNORMAL HIGH (ref 11.0–14.6)
RETIC %: 4.28 % — AB (ref 0.80–1.80)
Retic Ct Abs: 147.23 10*3/uL — ABNORMAL HIGH (ref 34.80–93.90)
WBC: 2.5 10*3/uL — AB (ref 4.0–10.3)
lymph#: 1.8 10*3/uL (ref 0.9–3.3)

## 2016-09-17 LAB — TECHNOLOGIST REVIEW

## 2016-09-17 LAB — LACTATE DEHYDROGENASE: LDH: 309 U/L — ABNORMAL HIGH (ref 125–245)

## 2016-09-17 NOTE — Telephone Encounter (Signed)
Spoke with patient regarding his appt. Did not want avs or calendar.

## 2016-09-18 ENCOUNTER — Encounter (HOSPITAL_COMMUNITY): Payer: Self-pay

## 2016-09-18 ENCOUNTER — Ambulatory Visit (HOSPITAL_COMMUNITY)
Admission: RE | Admit: 2016-09-18 | Discharge: 2016-09-18 | Disposition: A | Discharge status: Home / Self Care | Payer: Medicare Other | Source: Ambulatory Visit | Attending: Hematology | Admitting: Hematology

## 2016-09-18 DIAGNOSIS — K219 Gastro-esophageal reflux disease without esophagitis: Secondary | ICD-10-CM | POA: Insufficient documentation

## 2016-09-18 DIAGNOSIS — R161 Splenomegaly, not elsewhere classified: Secondary | ICD-10-CM | POA: Insufficient documentation

## 2016-09-18 DIAGNOSIS — D7589 Other specified diseases of blood and blood-forming organs: Secondary | ICD-10-CM | POA: Diagnosis not present

## 2016-09-18 DIAGNOSIS — I10 Essential (primary) hypertension: Secondary | ICD-10-CM | POA: Insufficient documentation

## 2016-09-18 DIAGNOSIS — I471 Supraventricular tachycardia: Secondary | ICD-10-CM | POA: Insufficient documentation

## 2016-09-18 DIAGNOSIS — N521 Erectile dysfunction due to diseases classified elsewhere: Secondary | ICD-10-CM | POA: Insufficient documentation

## 2016-09-18 DIAGNOSIS — L409 Psoriasis, unspecified: Secondary | ICD-10-CM | POA: Diagnosis not present

## 2016-09-18 DIAGNOSIS — Z7982 Long term (current) use of aspirin: Secondary | ICD-10-CM | POA: Diagnosis not present

## 2016-09-18 DIAGNOSIS — D61818 Other pancytopenia: Secondary | ICD-10-CM | POA: Insufficient documentation

## 2016-09-18 DIAGNOSIS — Z79899 Other long term (current) drug therapy: Secondary | ICD-10-CM | POA: Diagnosis not present

## 2016-09-18 DIAGNOSIS — Z9049 Acquired absence of other specified parts of digestive tract: Secondary | ICD-10-CM | POA: Insufficient documentation

## 2016-09-18 DIAGNOSIS — E785 Hyperlipidemia, unspecified: Secondary | ICD-10-CM | POA: Diagnosis not present

## 2016-09-18 DIAGNOSIS — I4891 Unspecified atrial fibrillation: Secondary | ICD-10-CM | POA: Diagnosis not present

## 2016-09-18 DIAGNOSIS — Z87442 Personal history of urinary calculi: Secondary | ICD-10-CM | POA: Insufficient documentation

## 2016-09-18 DIAGNOSIS — Z9889 Other specified postprocedural states: Secondary | ICD-10-CM | POA: Diagnosis not present

## 2016-09-18 DIAGNOSIS — I2510 Atherosclerotic heart disease of native coronary artery without angina pectoris: Secondary | ICD-10-CM | POA: Insufficient documentation

## 2016-09-18 LAB — CBC WITH DIFFERENTIAL/PLATELET
BASOS PCT: 0 %
Basophils Absolute: 0 10*3/uL (ref 0.0–0.1)
Eosinophils Absolute: 0 10*3/uL (ref 0.0–0.7)
Eosinophils Relative: 1 %
HEMATOCRIT: 27.7 % — AB (ref 39.0–52.0)
HEMOGLOBIN: 9.1 g/dL — AB (ref 13.0–17.0)
LYMPHS PCT: 62 %
Lymphs Abs: 1 10*3/uL (ref 0.7–4.0)
MCH: 26.8 pg (ref 26.0–34.0)
MCHC: 32.9 g/dL (ref 30.0–36.0)
MCV: 81.7 fL (ref 78.0–100.0)
MONOS PCT: 9 %
Monocytes Absolute: 0.2 10*3/uL (ref 0.1–1.0)
NEUTROS ABS: 0.5 10*3/uL — AB (ref 1.7–7.7)
NEUTROS PCT: 28 %
Platelets: 121 10*3/uL — ABNORMAL LOW (ref 150–400)
RBC: 3.39 MIL/uL — ABNORMAL LOW (ref 4.22–5.81)
RDW: 18.2 % — ABNORMAL HIGH (ref 11.5–15.5)
WBC: 1.7 10*3/uL — ABNORMAL LOW (ref 4.0–10.5)

## 2016-09-18 LAB — DIRECT ANTIGLOBULIN TEST (NOT AT ARMC): COOMBS', DIRECT: NEGATIVE

## 2016-09-18 LAB — APTT: aPTT: 32 seconds (ref 24–36)

## 2016-09-18 LAB — PROTIME-INR
INR: 1.11
Prothrombin Time: 14.2 seconds (ref 11.4–15.2)

## 2016-09-18 LAB — HAPTOGLOBIN: Haptoglobin: <10 mg/dL — ABNORMAL LOW (ref 34–200)

## 2016-09-18 MED ORDER — FENTANYL CITRATE (PF) 100 MCG/2ML IJ SOLN
INTRAMUSCULAR | Status: AC
  Filled 2016-09-18: qty 4

## 2016-09-18 MED ORDER — SODIUM CHLORIDE 0.9 % IV SOLN
INTRAVENOUS | Status: DC
  Administered 2016-09-18: 10:00:00 via INTRAVENOUS

## 2016-09-18 MED ORDER — MIDAZOLAM HCL 2 MG/2ML IJ SOLN
INTRAMUSCULAR | Status: AC
  Filled 2016-09-18: qty 4

## 2016-09-18 MED ORDER — MIDAZOLAM HCL 2 MG/2ML IJ SOLN
INTRAMUSCULAR | Status: AC | PRN
  Administered 2016-09-18 (×2): 1 mg via INTRAVENOUS

## 2016-09-18 MED ORDER — FENTANYL CITRATE (PF) 100 MCG/2ML IJ SOLN
INTRAMUSCULAR | Status: AC | PRN
  Administered 2016-09-18 (×2): 50 ug via INTRAVENOUS

## 2016-09-18 MED ORDER — LIDOCAINE HCL 1 % IJ SOLN
INTRAMUSCULAR | Status: AC | PRN
  Administered 2016-09-18: 15 mL

## 2016-09-18 NOTE — Discharge Instructions (Signed)
Bone Marrow Aspiration and Bone Marrow Biopsy, Adult, Care After °This sheet gives you information about how to care for yourself after your procedure. Your health care provider may also give you more specific instructions. If you have problems or questions, contact your health care provider. °What can I expect after the procedure? °After the procedure, it is common to have: °· Mild pain and tenderness. °· Swelling. °· Bruising. ° °Follow these instructions at home: °· Take over-the-counter or prescription medicines only as told by your health care provider. °· Do not take baths, swim, or use a hot tub until your health care provider approves. Ask if you can take a shower or have a sponge bath. °· Follow instructions from your health care provider about how to take care of the puncture site. Make sure you: °? Wash your hands with soap and water before you change your bandage (dressing). If soap and water are not available, use hand sanitizer. °? Change your dressing as told by your health care provider. °· Check your puncture site every day for signs of infection. Check for: °? More redness, swelling, or pain. °? More fluid or blood. °? Warmth. °? Pus or a bad smell. °· Return to your normal activities as told by your health care provider. Ask your health care provider what activities are safe for you. °· Do not drive for 24 hours if you were given a medicine to help you relax (sedative). °· Keep all follow-up visits as told by your health care provider. This is important. °Contact a health care provider if: °· You have more redness, swelling, or pain around the puncture site. °· You have more fluid or blood coming from the puncture site. °· Your puncture site feels warm to the touch. °· You have pus or a bad smell coming from the puncture site. °· You have a fever. °· Your pain is not controlled with medicine. °This information is not intended to replace advice given to you by your health care provider. Make sure  you discuss any questions you have with your health care provider. °Document Released: 07/19/2004 Document Revised: 07/20/2015 Document Reviewed: 06/13/2015 °Elsevier Interactive Patient Education © 2018 [employer] °Moderate Conscious Sedation, Adult, Care After °These instructions provide you with information about caring for yourself after your procedure. Your health care provider may also give you more specific instructions. Your treatment has been planned according to current medical practices, but problems sometimes occur. Call your health care provider if you have any problems or questions after your procedure. °What can I expect after the procedure? °After your procedure, it is common: °· To feel sleepy for several hours. °· To feel clumsy and have poor balance for several hours. °· To have poor judgment for several hours. °· To vomit if you eat too soon. ° °Follow these instructions at home: °For at least 24 hours after the procedure: ° °· Do not: °? Participate in activities where you could fall or become injured. °? Drive. °? Use heavy machinery. °? Drink alcohol. °? Take sleeping pills or medicines that cause drowsiness. °? Make important decisions or sign legal documents. °? Take care of children on your own. °· Rest. °Eating and drinking °· Follow the diet recommended by your health care provider. °· If you vomit: °? Drink water, juice, or soup when you can drink without vomiting. °? Make sure you have little or no nausea before eating solid foods. °General instructions °· Have a responsible adult stay with you until you are   awake and alert.  Take over-the-counter and prescription medicines only as told by your health care provider.  If you smoke, do not smoke without supervision.  Keep all follow-up visits as told by your health care provider. This is important. Contact a health care provider if:  You keep feeling nauseous or you keep vomiting.  You feel light-headed.  You develop a  rash.  You have a fever. Get help right away if:  You have trouble breathing. This information is not intended to replace advice given to you by your health care provider. Make sure you discuss any questions you have with your health care provider. Document Released: 10/20/2012 Document Revised: 06/04/2015 Document Reviewed: 04/21/2015 Elsevier Interactive Patient Education  [employer].

## 2016-09-18 NOTE — Procedures (Signed)
BM aspirate and core EBL 0 Comp 0 

## 2016-09-18 NOTE — H&P (Signed)
Referring Physician(s): Brunetta Genera  Supervising Physician: Marybelle Killings  Patient Status:  [facility]  Chief Complaint:  "I'm here for a bone marrow biopsy"  Subjective: Patient familiar to IR service from prior left nephrostomy 2012. He now presents with splenomegaly and new pancytopenia. He is scheduled today for CT-guided bone marrow biopsy to rule out MDS. He denies fever, headache, chest pain, dyspnea, cough, abdominal/back pain, nausea, vomiting or bleeding.  Past Medical History:  Diagnosis Date  . At risk for sleep apnea    STOP-BANG= 4    SENT TO PCP 02-06-2014  . Bilateral carotid artery stenosis without cerebral infarction    ASYMPTOMATIC--  BILATERAL ICA  50-69%  PER CARDIOLOGIST NOTE, DR Einar Gip  . Bilateral carotid bruits    LEFT > RIGHT  . Coronary artery disease cardiologist-- dr Stevphen [place] CAD  per cath 08-06-2004 HX PALPITATIONS-AND -TACHYCARDIA-  . First degree heart block   . Full dentures   . GERD (gastroesophageal reflux disease)   . Heart palpitations    Not any more  . History of atrial fibrillation without current medication    EPISODE OF AFIB WITH RVR INTRAOPERATIVELY 02-09-2014 AT [facility]--  RESOLVED AND PT FOLLOWED UP WITH CARDIOLOGIST  DR Einar Gip  . History of kidney stones   . Hyperlipidemia   . Hypertension   . Nephrolithiasis    RIGHT  . Organic impotence   . Psoriasis    Clearing up  . PSVT (paroxysmal supraventricular tachycardia) ([place])   . Right ureteral calculus   . S/P radiofrequency ablation operation for arrhythmia-SVT 04/28/2014  . Simple renal cyst    right  . Wears glasses   . Wears hearing aid    BILATERAL   Past Surgical History:  Procedure Laterality Date  . CARDIAC CATHETERIZATION  08-06-2004 dr Verlon Setting   (abnormal stress test) Non-obstructive CAD, pLAD 20-30%,  LCX 30-40%, pRCA 20-30%, dRCA 40%, distal LM  mild diffuse calcifation 20-30% tapering stenosis,  preserved LVSF ef 55-60%  . CARPAL TUNNEL RELEASE  Bilateral 2005  . CATARACT EXTRACTION, BILATERAL Bilateral 2011  . CHOLECYSTECTOMY OPEN  1982   and NEPHROLITHOTOMY  . COLONOSCOPY W/ POLYPECTOMY  last one 04-05-2008  . CYSTOSCOPY WITH RETROGRADE PYELOGRAM, URETEROSCOPY AND STENT PLACEMENT Right 03/14/2014   Procedure: CYSTO/RIGHT RETROGRADE PYELOGRAM/RIGHT URETEROSCOPY/STONE EXTRACTION/STENT PLACEMENT;  Surgeon: Malka So, MD;  Location: [facility];  Service: Urology;  Laterality: Right;  . HOLMIUM LASER APPLICATION Right 04/19/5679   Procedure: RIGHT HOLMIUM LASER APPLICATION;  Surgeon: Malka So, MD;  Location: [facility];  Service: Urology;  Laterality: Right;  . KNEE ARTHROSCOPY W/ DEBRIDEMENT Right 11-23-2002   and Synovectomy  . NEPHROLITHOTOMY  01/16/2011   Procedure: NEPHROLITHOTOMY PERCUTANEOUS;  Surgeon: Malka So;  Location: [facility];  Service: Urology;  Laterality: Left;  C-Arm  Holmium Laser  . NEPHROLITHOTOMY  1981  . SUPRAVENTRICULAR TACHYCARDIA ABLATION N/A 04/28/2014   Procedure: SUPRAVENTRICULAR TACHYCARDIA ABLATION;  Surgeon: Evans Lance, MD;  Location: [facility] CATH LAB;  Service: Cardiovascular;  Laterality: N/A;     Allergies: Patient has no known allergies.  Medications: Prior to Admission medications   Medication Sig Start Date End Date Taking? Authorizing Provider  atorvastatin (LIPITOR) 20 MG tablet Take 20 mg by mouth daily.   Yes [provider]  losartan (COZAAR) 50 MG tablet Take 50 mg by mouth daily.   Yes [provider]  pantoprazole (PROTONIX) 40 MG tablet Take 40 mg by mouth  every morning.    Yes [provider]  traMADol (ULTRAM) 50 MG tablet Take 50 mg by mouth every 6 (six) hours as needed for moderate pain.    Yes [provider]  aspirin 81 MG tablet Take 81 mg by mouth every other day.     [provider]  diphenhydramine-acetaminophen (TYLENOL PM) 25-500 MG TABS Take 2 tablets by mouth at bedtime.    [provider]     Vital Signs: BP (!) 155/62 (BP Location: Right Arm)   Pulse 71   Temp 98 F (36.7 C) (Oral)   Resp 18   SpO2 100%   Physical Exam awake, alert. Chest clear to auscultation bilaterally. Heart with regular rate and rhythm. Abdomen soft, obese, positive bowel sounds, nontender. Trace to 1+ bilateral lower extremity edema.  Imaging: No results found.  Labs:  CBC:  Recent Labs  09/09/16 1025 09/17/16 1523  WBC 3.6* 2.5*  HGB 9.4* 9.2*  HCT 29.2*  28.0* 28.3*  PLT 108* 107*    COAGS: No results for input(s): INR, APTT in the last 8760 hours.  BMP:  Recent Labs  09/09/16 1025 09/17/16 1523  NA 141 141  K 3.5 4.0  CO2 23 22  GLUCOSE 146* 90  BUN 16.8 17.0  CALCIUM 8.6 8.5  CREATININE 1.6* 1.6*    LIVER FUNCTION TESTS:  Recent Labs  09/09/16 1025 09/17/16 1523  BILITOT 1.91* 1.71*  AST 36* 24  ALT 33 21  ALKPHOS 145 142  PROT 6.2*  6.1 6.0*  ALBUMIN 3.5 3.4*    Assessment and Plan: Pt with hx splenomegaly and new pancytopenia. He is scheduled today for CT-guided bone marrow biopsy to rule out MDS.Risks and benefits discussed with the patient/spouse including, but not limited to bleeding, infection, damage to adjacent structures or low yield requiring additional tests.All of the patient's questions were answered, patient is agreeable to proceed.Consent signed and in chart.     Electronically Signed: D. Rowe Tylar, PA-C 09/18/2016, 9:24 AM   I spent a total of 20 minutes at the the patient's bedside AND on the patient's hospital floor or unit, greater than 50% of which was counseling/coordinating care for CT-guided bone marrow biopsy

## 2016-09-22 NOTE — Progress Notes (Signed)
[name]    HEMATOLOGY/ONCOLOGY CLINIC NOTE  Date of Service: 09/22/2016  Patient Care Team: Deland Pretty, MD as PCP - General (Internal Medicine)  CHIEF COMPLAINTS/PURPOSE OF CONSULTATION:  Anemia and leucopenia  HISTORY OF PRESENTING ILLNESS:   Brandon Fischer is a wonderful 83 y.o. male  who has been referred to [place] by Dr .Deland Pretty, MD for evaluation and management of anemia and leukopenia.  Patient has a history of hypertension, dyslipidemia, atrial fibrillation, coronary artery disease, bilateral carotid artery stenosis, paroxysmal supraventricular tachycardias status post radiofrequency ablation, GERD who on recent labs with his primary care physician on 09/01/2016 which showed new anemia with a hemoglobin of 9.3 with an MCV of 82.9 and RDW of 18.5. WBC count of 2.1k with an ANC of about 700 and mild thrombocytopenia of 119k.  CMP showed bilirubin of 1.7 with an alkaline phosphatase of 154 normal AST ALT creatinine 1.9 with BUN of 24.  Patient subsequently had an ultrasound of the abdomen done on 09/09/2016 for evaluation of his abnormal liver function tests and renal insufficiency and was noted to have severe splenomegaly with this plane off 18 cm in size with a volume of 1407 mL. No focal hepatic abnormalities. No bladder distention.. 3.7 cm abdominal aortic aneurysm.  Repeat labs done in clinic today show a slight improvement in WBC count of 3.6k with an ANC of 1000. Hemoglobin 9.4 and platelets of 108k . Patient has a obvious deep palpable spleen on clinical examination. Overtly palpable lymphadenopathy noted.  Patient notes no overt weight loss fevers chills or night sweats.  No change in mental status. No chest pain no shortness of breath no significant abdominal discomfort headaches. No evidence of bleeding. No issues with recent infections. Some fatigue.  INTERVAL HISTORY  Patient is here for f/u of his lab results. He has not had his BM Bx and PET/CT as per plan but  these have been scheduled. No other acute new symptoms.  MEDICAL HISTORY:  Past Medical History:  Diagnosis Date  . At risk for sleep apnea    STOP-BANG= 4    SENT TO PCP 02-06-2014  . Bilateral carotid artery stenosis without cerebral infarction    ASYMPTOMATIC--  BILATERAL ICA  50-69%  PER CARDIOLOGIST NOTE, DR Einar Gip  . Bilateral carotid bruits    LEFT > RIGHT  . Coronary artery disease cardiologist-- dr Stevphen [place] CAD  per cath 08-06-2004 HX PALPITATIONS-AND -TACHYCARDIA-  . First degree heart block   . Full dentures   . GERD (gastroesophageal reflux disease)   . Heart palpitations    Not any more  . History of atrial fibrillation without current medication    EPISODE OF AFIB WITH RVR INTRAOPERATIVELY 02-09-2014 AT [facility]--  RESOLVED AND PT FOLLOWED UP WITH CARDIOLOGIST  DR Einar Gip  . History of kidney stones   . Hyperlipidemia   . Hypertension   . Nephrolithiasis    RIGHT  . Organic impotence   . Psoriasis    Clearing up  . PSVT (paroxysmal supraventricular tachycardia) ([place])   . Right ureteral calculus   . S/P radiofrequency ablation operation for arrhythmia-SVT 04/28/2014  . Simple renal cyst    right  . Wears glasses   . Wears hearing aid    BILATERAL  History of malignant melanoma status post Mohs surgery to remove the skin lesion on the face with skin grafting. 3 years ago.  SURGICAL HISTORY: Past Surgical History:  Procedure Laterality Date  . CARDIAC CATHETERIZATION  08-06-2004 dr  kersey   (abnormal stress test) Non-obstructive CAD, pLAD 20-30%,  LCX 30-40%, pRCA 20-30%, dRCA 40%, distal LM  mild diffuse calcifation 20-30% tapering stenosis,  preserved LVSF ef 55-60%  . CARPAL TUNNEL RELEASE Bilateral 2005  . CATARACT EXTRACTION, BILATERAL Bilateral 2011  . CHOLECYSTECTOMY OPEN  1982   and NEPHROLITHOTOMY  . COLONOSCOPY W/ POLYPECTOMY  last one 04-05-2008  . CYSTOSCOPY WITH RETROGRADE PYELOGRAM, URETEROSCOPY AND STENT PLACEMENT Right 03/14/2014    Procedure: CYSTO/RIGHT RETROGRADE PYELOGRAM/RIGHT URETEROSCOPY/STONE EXTRACTION/STENT PLACEMENT;  Surgeon: Malka So, MD;  Location: [facility];  Service: Urology;  Laterality: Right;  . HOLMIUM LASER APPLICATION Right 03/15/2023   Procedure: RIGHT HOLMIUM LASER APPLICATION;  Surgeon: Malka So, MD;  Location[facility];  Service: Urology;  Laterality: Right;  . KNEE ARTHROSCOPY W/ DEBRIDEMENT Right 11-23-2002   and Synovectomy  . NEPHROLITHOTOMY  01/16/2011   Procedure: NEPHROLITHOTOMY PERCUTANEOUS;  Surgeon: Malka So;  Location: [facility];  Service: Urology;  Laterality: Left;  C-Arm  Holmium Laser  . NEPHROLITHOTOMY  1981  . SUPRAVENTRICULAR TACHYCARDIA ABLATION N/A 04/28/2014   Procedure: SUPRAVENTRICULAR TACHYCARDIA ABLATION;  Surgeon: Evans Lance, MD;  Location: [facility] CATH LAB;  Service: Cardiovascular;  Laterality: N/A;    SOCIAL HISTORY: Social History   Social History  . Marital status: Married    Spouse name: N/A  . Number of children: 2  . Years of education: N/A   Occupational History  . postal service      retired   Social History Main Topics  . Smoking status: Former Smoker    Packs/day: 1.00    Years: 40.00    Types: Cigarettes    Quit date: 02/06/1998  . Smokeless tobacco: Never Used  . Alcohol use No  . Drug use: No  . Sexual activity: Not on file   Other Topics Concern  . Not on file   Social History Narrative  . No narrative on file    FAMILY HISTORY: Family History  Problem Relation Age of Onset  . Cancer Mother        Ovarian or colon  . Heart attack Father   . Heart attack Brother     ALLERGIES:  has No Known Allergies.  MEDICATIONS:  Current Outpatient Prescriptions  Medication Sig Dispense Refill  . aspirin 81 MG tablet Take 81 mg by mouth every other day.     [name] atorvastatin (LIPITOR) 20 MG tablet Take 20 mg by mouth daily.    . diphenhydramine-acetaminophen (TYLENOL PM) 25-500 MG TABS Take 2 tablets  by mouth at bedtime.    [name] losartan (COZAAR) 50 MG tablet Take 50 mg by mouth daily.    . pantoprazole (PROTONIX) 40 MG tablet Take 40 mg by mouth every morning.     . traMADol (ULTRAM) 50 MG tablet Take 50 mg by mouth every 6 (six) hours as needed for moderate pain.      No current facility-administered medications for this visit.     REVIEW OF SYSTEMS:    10 Point review of Systems was done is negative except as noted above.  PHYSICAL EXAMINATION: ECOG PERFORMANCE STATUS: 2 - Symptomatic, <50% confined to bed  . Vitals:   09/17/16 1420  BP: (!) 146/67  Pulse: 76  Resp: 18  Temp: 98.2 F (36.8 C)  SpO2: 100%   Filed Weights   09/17/16 1420  Weight: 203 lb 4.8 oz (92.2 kg)   .Body mass index is 28.35 kg/m.  GENERAL:alert,  in no acute distress and comfortable SKIN: no acute rashes, no significant lesions EYES: conjunctiva are pink and non-injected, sclera anicteric OROPHARYNX: MMM, no exudates, no oropharyngeal erythema or ulceration NECK: supple, no JVD LYMPH:  no palpable lymphadenopathy in the cervical, axillary or inguinal regions LUNGS: clear to auscultation b/l with normal respiratory effort HEART: regular rate & rhythm ABDOMEN:  normoactive bowel sounds , palpable splenomegaly Extremity: no pedal edema PSYCH: alert & oriented x 3 with fluent speech NEURO: no focal motor/sensory deficits  LABORATORY DATA:  I have reviewed the data as listed  . CBC Latest Ref Rng & Units 09/18/2016 09/17/2016 09/09/2016  WBC 4.0 - 10.5 K/uL 1.7(L) 2.5(L) 3.6(L)  Hemoglobin 13.0 - 17.0 g/dL 9.1(L) 9.2(L) 9.4(L)  Hematocrit 39.0 - 52.0 % 27.7(L) 28.3(L) 29.2(L)  Platelets 150 - 400 K/uL 121(L) 107(L) 108(L)   ANC 1000  CBC    Component Value Date/Time   WBC 1.7 (L) 09/18/2016 0902   RBC 3.39 (L) 09/18/2016 0902   HGB 9.1 (L) 09/18/2016 0902   HGB 9.2 (L) 09/17/2016 1523   HCT 27.7 (L) 09/18/2016 0902   HCT 28.3 (L) 09/17/2016 1523   PLT 121 (L) 09/18/2016 0902   PLT 107  (L) 09/17/2016 1523   MCV 81.7 09/18/2016 0902   MCV 82.3 09/17/2016 1523   MCH 26.8 09/18/2016 0902   MCHC 32.9 09/18/2016 0902   RDW 18.2 (H) 09/18/2016 0902   RDW 18.5 (H) 09/17/2016 1523   LYMPHSABS 1.0 09/18/2016 0902   LYMPHSABS 1.8 09/17/2016 1523   MONOABS 0.2 09/18/2016 0902   MONOABS 0.2 09/17/2016 1523   EOSABS 0.0 09/18/2016 0902   EOSABS 0.0 09/17/2016 1523   BASOSABS 0.0 09/18/2016 0902   BASOSABS 0.0 09/17/2016 1523     . CMP Latest Ref Rng & Units 09/17/2016 09/09/2016 09/09/2016  Glucose 70 - 140 mg/dl 90 146(H) -  BUN 7.0 - 26.0 mg/dL 17.0 16.8 -  Creatinine 0.7 - 1.3 mg/dL 1.6(H) 1.6(H) -  Sodium 136 - 145 mEq/L 141 141 -  Potassium 3.5 - 5.1 mEq/L 4.0 3.5 -  Chloride 96 - 112 mEq/L - - -  CO2 22 - 29 mEq/L 22 23 -  Calcium 8.4 - 10.4 mg/dL 8.5 8.6 -  Total Protein 6.4 - 8.3 g/dL 6.0(L) 6.2(L) 6.1  Total Bilirubin 0.20 - 1.20 mg/dL 1.71(H) 1.91(H) -  Alkaline Phos 40 - 150 U/L 142 145 -  AST 5 - 34 U/L 24 36(H) -  ALT 0 - 55 U/L 21 33 -   Component     Latest Ref Rng & Units 09/09/2016  IgG (Immunoglobin G), Serum     700 - 1,600 mg/dL 917  IgA/Immunoglobulin A, Serum     61 - 437 mg/dL 203  IgM, Qn, Serum     15 - 143 mg/dL 43  Total Protein     6.0 - 8.5 g/dL 6.1  Albumin SerPl Elph-Mcnc     2.9 - 4.4 g/dL 3.7  Alpha 1     0.0 - 0.4 g/dL 0.3  Alpha2 Glob SerPl Elph-Mcnc     0.4 - 1.0 g/dL 0.5  B-Globulin SerPl Elph-Mcnc     0.7 - 1.3 g/dL 0.8  Gamma Glob SerPl Elph-Mcnc     0.4 - 1.8 g/dL 0.7  M Protein SerPl Elph-Mcnc     Not Observed g/dL Not Observed  Globulin, Total     2.2 - 3.9 g/dL 2.4  Albumin/Glob SerPl     0.7 - 1.7  1.6  IFE 1      Comment  Please Note (HCV):      Comment  Iron     42 - 163 ug/dL 64  TIBC     202 - 409 ug/dL 194 (L)  UIBC     117 - 376 ug/dL 130  %SAT     20 - 55 % 33  Folate, Hemolysate     Not Estab. ng/mL 369.8  HCT     37.5 - 51.0 % 28.0 (L)  Folate, RBC     >498 ng/mL 1,321  Ig Kappa Free  Light Chain     3.3 - 19.4 mg/L 21.4 (H)  Ig Lambda Free Light Chain     5.7 - 26.3 mg/L 36.6 (H)  Kappa/Lambda FluidC Ratio     0.26 - 1.65 0.58  LDH     125 - 245 U/L 326 (H)  Sed Rate     0 - 30 mm/hr 2  Vitamin B12     232 - 1245 pg/mL 1,261 (H)  TSH     0.320 - 4.118 m(IU)/L 2.609  Hep C Virus Ab     0.0 - 0.9 s/co ratio 0.1  Ferritin     22 - 316 ng/ml 399 (H)   Component     Latest Ref Rng & Units 09/17/2016 09/18/2016  Prothrombin Time     11.4 - 15.2 seconds  14.2  INR       1.11  LDH     125 - 245 U/L 309 (H)   Haptoglobin     34 - 200 mg/dL <10 (L)   Coombs', Direct     Negative Negative   APTT     24 - 36 seconds  32   RADIOGRAPHIC STUDIES: I have personally reviewed the radiological images as listed and agreed with the findings in the report. US Abdomen Complete  Result Date: 09/09/2016 CLINICAL DATA:  Elevated alkaline phosphatase. Renal insufficiency. Cholecystectomy. Splenomegaly. EXAM: ABDOMEN ULTRASOUND COMPLETE COMPARISON:  Ultrasound 11/16/2014. FINDINGS: Gallbladder: Cholecystectomy. Common bile duct: Diameter: 2.6 mm Liver: No focal lesion identified. Within normal limits in parenchymal echogenicity. Portal vein is patent on color Doppler imaging with normal direction of blood flow towards the liver. IVC: No abnormality visualized. Pancreas: Visualized portion unremarkable. Spleen: Splenomegaly at 18 cm with a volume of 1407 cc. Right Kidney: Length: 10.0 cm. Echogenicity within normal limits. No hydronephrosis visualized. 4.3 cm and 1.4 cm simple cyst lower pole right kidney. Left Kidney: Length: 10.0 cm. Echogenicity within normal limits. No mass or hydronephrosis visualized. Abdominal aorta: Abdominal aortic aneurysm at 3.7 cm. Other findings: None. IMPRESSION: 1. Cholecystectomy. No biliary distention. No focal hepatic abnormality identified. Liver echotexture normal. 2.  Severe splenomegaly. 3.  Simple cysts left kidney. 4.  3.7 cm abdominal aortic  aneurysm. Electronically Signed   By: Marcello Moores  Register   On: 09/09/2016 12:49   Ct Biopsy  Result Date: 09/18/2016 INDICATION: Pancytopenia EXAM: CT BIOPSY; CT BONE MARROW BIOPSY AND ASPIRATION MEDICATIONS: None. ANESTHESIA/SEDATION: Fentanyl 50 mcg IV; Versed 1 mg IV Moderate Sedation Time:  10 The patient was continuously monitored during the procedure by the interventional radiology nurse under my direct supervision. FLUOROSCOPY TIME:  Fluoroscopy Time:  minutes  seconds ( mGy). COMPLICATIONS: None immediate. PROCEDURE: Informed written consent was obtained from the patient after a thorough discussion of the procedural risks, benefits and alternatives. All questions were addressed. Maximal Sterile Barrier Technique was utilized including caps, mask, sterile gowns, sterile gloves,  sterile drape, hand hygiene and skin antiseptic. A timeout was performed prior to the initiation of the procedure. Under CT guidance, a(n) 11 gauge guide needle was advanced into the right iliac bone. Aspirates and a core were obtained Post biopsy images demonstrate no hemorrhage. Patient tolerated the procedure well without complication. Vital sign monitoring by nursing staff during the procedure will continue as patient is in the special procedures unit for post procedure observation. FINDINGS: The images document guide needle placement within the right iliac bone. Post biopsy images demonstrate no hemorrhage. IMPRESSION: Successful CT-guided bone marrow aspirate and core. Electronically Signed   By: Marybelle Killings M.D.   On: 09/18/2016 12:10   Ct Bone Marrow Biopsy & Aspiration  Result Date: 09/18/2016 INDICATION: Pancytopenia EXAM: CT BIOPSY; CT BONE MARROW BIOPSY AND ASPIRATION MEDICATIONS: None. ANESTHESIA/SEDATION: Fentanyl 50 mcg IV; Versed 1 mg IV Moderate Sedation Time:  10 The patient was continuously monitored during the procedure by the interventional radiology nurse under my direct supervision. FLUOROSCOPY TIME:   Fluoroscopy Time:  minutes  seconds ( mGy). COMPLICATIONS: None immediate. PROCEDURE: Informed written consent was obtained from the patient after a thorough discussion of the procedural risks, benefits and alternatives. All questions were addressed. Maximal Sterile Barrier Technique was utilized including caps, mask, sterile gowns, sterile gloves, sterile drape, hand hygiene and skin antiseptic. A timeout was performed prior to the initiation of the procedure. Under CT guidance, a(n) 11 gauge guide needle was advanced into the right iliac bone. Aspirates and a core were obtained Post biopsy images demonstrate no hemorrhage. Patient tolerated the procedure well without complication. Vital sign monitoring by nursing staff during the procedure will continue as patient is in the special procedures unit for post procedure observation. FINDINGS: The images document guide needle placement within the right iliac bone. Post biopsy images demonstrate no hemorrhage. IMPRESSION: Successful CT-guided bone marrow aspirate and core. Electronically Signed   By: Marybelle Killings M.D.   On: 09/18/2016 12:10    ASSESSMENT & PLAN:   #1 Pancytopenia with normocytic Anemia, neutropenia and thrombocytopenia This could certainly be from the patient's splenomegaly causing hypersplenism. Etiology of the splenomegaly is unclear though in the setting of elevated LDH levels would need to rule out possibility of splenic lymphoma/splenic marginal zone lymphoma or bone marrow failure state with compensatory extramedullary hematopoiesis in the spleen. R/o myelofibrosis.  No evidence of monoclonal paraproteinemia. TSH levels within normal limits B12, folate and iron levels within normal limits. Hepatitis C negative  #2 Elevated LDH With mild elevated bilirubin cannot r/o some hemolysis though that would not explain leucopenia/neutropenia or thrombocytopenia and splenomegaly by itself LDH slightly lower. Haptoglobin <10 --- suggestive  of some hemolysis. Coombs neg.   PLAN -labs were ordered and reviewed. -CT guided Bone marrow biopsy to r/o PMF/MDS/lymphoma other etiologies.  -PET/CT to evaluate massive splenomegaly in the absence of overt liver pathology and presence of pancytopenia with increased LDH- high index of suspicion for possible lymphoma. -  Labs today RTC with Dr Irene Limbo in 10-12 days   PET/CT scan-scheduled for 9/14 CT bone marrrow biopsy scheduled for 9/6 . Orders Placed This Encounter  Procedures  . CBC & Diff and Retic    Standing Status:   Future    Number of Occurrences:   1    Standing Expiration Date:   09/17/2017  . Comprehensive metabolic panel    Standing Status:   Future    Number of Occurrences:   1    Standing Expiration Date:  09/17/2017  . Lactate dehydrogenase    Standing Status:   Future    Number of Occurrences:   1    Standing Expiration Date:   09/17/2017  . Haptoglobin    Standing Status:   Future    Number of Occurrences:   1    Standing Expiration Date:   09/17/2017  . Flow Cytometry    Abnormal looking lymphocytes with splenomegaly and elevated LDH levels r/o lymphoproliferative disorder    Standing Status:   Future    Number of Occurrences:   1    Standing Expiration Date:   09/17/2017  . Direct antiglobulin test (not at [facility])    Standing Status:   Future    Number of Occurrences:   1    Standing Expiration Date:   10/22/2017  . Type and screen    Standing Status:   Future    Standing Expiration Date:   09/17/2017    All of the patients questions were answered with apparent satisfaction. The patient knows to call the clinic with any problems, questions or concerns.  I spent 20 minutes counseling the patient face to face. The total time spent in the appointment was 25 minutes and more than 50% was on counseling and direct patient cares.    Sullivan Lone MD [place] AAHIVMS [facility] Hematology/Oncology Physician [facility]  (Office):       [phone] (Work cell):   [phone] (Fax):           [phone]

## 2016-09-26 ENCOUNTER — Ambulatory Visit (HOSPITAL_COMMUNITY)
Admission: RE | Admit: 2016-09-26 | Discharge: 2016-09-26 | Disposition: A | Discharge status: Home / Self Care | Payer: Medicare Other | Source: Ambulatory Visit | Attending: Hematology | Admitting: Hematology

## 2016-09-26 DIAGNOSIS — R7402 Elevation of levels of lactic acid dehydrogenase (LDH): Secondary | ICD-10-CM

## 2016-09-26 DIAGNOSIS — Z79899 Other long term (current) drug therapy: Secondary | ICD-10-CM | POA: Insufficient documentation

## 2016-09-26 DIAGNOSIS — R161 Splenomegaly, not elsewhere classified: Secondary | ICD-10-CM | POA: Diagnosis not present

## 2016-09-26 DIAGNOSIS — D61818 Other pancytopenia: Secondary | ICD-10-CM | POA: Insufficient documentation

## 2016-09-26 DIAGNOSIS — R937 Abnormal findings on diagnostic imaging of other parts of musculoskeletal system: Secondary | ICD-10-CM | POA: Diagnosis not present

## 2016-09-26 DIAGNOSIS — R740 Nonspecific elevation of levels of transaminase and lactic acid dehydrogenase [LDH]: Secondary | ICD-10-CM | POA: Diagnosis not present

## 2016-09-26 DIAGNOSIS — Z7982 Long term (current) use of aspirin: Secondary | ICD-10-CM | POA: Insufficient documentation

## 2016-09-26 LAB — GLUCOSE, CAPILLARY: Glucose-Capillary: 91 mg/dL (ref 65–99)

## 2016-09-26 MED ORDER — FLUDEOXYGLUCOSE F - 18 (FDG) INJECTION
10.6800 | Freq: Once | INTRAVENOUS | Status: AC | PRN
  Administered 2016-09-26: 10.68 via INTRAVENOUS

## 2016-09-29 NOTE — Progress Notes (Signed)
[name]    HEMATOLOGY/ONCOLOGY CLINIC NOTE  Date of Service: 09/30/2016  Patient Care Team: Deland Pretty, MD as PCP - General (Internal Medicine)  CHIEF COMPLAINTS/PURPOSE OF CONSULTATION:  Anemia and leucopenia  HISTORY OF PRESENTING ILLNESS:   Brandon Fischer is a wonderful 83 y.o. male  who has been referred to [place] by Dr .Deland Pretty, MD for evaluation and management of anemia and leukopenia.  Patient has a history of hypertension, dyslipidemia, atrial fibrillation, coronary artery disease, bilateral carotid artery stenosis, paroxysmal supraventricular tachycardias status post radiofrequency ablation, GERD who on recent labs with his primary care physician on 09/01/2016 which showed new anemia with a hemoglobin of 9.3 with an MCV of 82.9 and RDW of 18.5. WBC count of 2.1k with an ANC of about 700 and mild thrombocytopenia of 119k.  CMP showed bilirubin of 1.7 with an alkaline phosphatase of 154 normal AST ALT creatinine 1.9 with BUN of 24.  Patient subsequently had an ultrasound of the abdomen done on 09/09/2016 for evaluation of his abnormal liver function tests and renal insufficiency and was noted to have severe splenomegaly with this plane off 18 cm in size with a volume of 1407 mL. No focal hepatic abnormalities. No bladder distention.. 3.7 cm abdominal aortic aneurysm.  Repeat labs done in clinic today show a slight improvement in WBC count of 3.6k with an ANC of 1000. Hemoglobin 9.4 and platelets of 108k . Patient has a obvious deep palpable spleen on clinical examination. Overtly palpable lymphadenopathy noted.  Patient notes no overt weight loss fevers chills or night sweats.  No change in mental status. No chest pain no shortness of breath no significant abdominal discomfort headaches. No evidence of bleeding. No issues with recent infections. Some fatigue.  INTERVAL HISTORY  Patient is here for f/u. Since our last visit he underwent a bone marrow biopsy which he says  went well. There were no signs of acute leukemia or advert signs of myelodysplasia . However, large clusters of T lymphocytes were found raising the possibility of T-LGL vs autoimmune process vs viral infection. PET scan from 9/14 showed no obvious signs of enlarged lymph nodes or masses.  He has increased joint pain in his right hip, never ruled as rheumatid arthritis.   MEDICAL HISTORY:  Past Medical History:  Diagnosis Date  . At risk for sleep apnea    STOP-BANG= 4    SENT TO PCP 02-06-2014  . Bilateral carotid artery stenosis without cerebral infarction    ASYMPTOMATIC--  BILATERAL ICA  50-69%  PER CARDIOLOGIST NOTE, DR Einar Gip  . Bilateral carotid bruits    LEFT > RIGHT  . Coronary artery disease cardiologist-- dr Stevphen [place] CAD  per cath 08-06-2004 HX PALPITATIONS-AND -TACHYCARDIA-  . First degree heart block   . Full dentures   . GERD (gastroesophageal reflux disease)   . Heart palpitations    Not any more  . History of atrial fibrillation without current medication    EPISODE OF AFIB WITH RVR INTRAOPERATIVELY 02-09-2014 AT [facility]--  RESOLVED AND PT FOLLOWED UP WITH CARDIOLOGIST  DR Einar Gip  . History of kidney stones   . Hyperlipidemia   . Hypertension   . Nephrolithiasis    RIGHT  . Organic impotence   . Psoriasis    Clearing up  . PSVT (paroxysmal supraventricular tachycardia) ([place])   . Right ureteral calculus   . S/P radiofrequency ablation operation for arrhythmia-SVT 04/28/2014  . Simple renal cyst    right  . Wears  glasses   . Wears hearing aid    BILATERAL  History of malignant melanoma status post Mohs surgery to remove the skin lesion on the face with skin grafting. 3 years ago.  SURGICAL HISTORY: Past Surgical History:  Procedure Laterality Date  . CARDIAC CATHETERIZATION  08-06-2004 dr Verlon Setting   (abnormal stress test) Non-obstructive CAD, pLAD 20-30%,  LCX 30-40%, pRCA 20-30%, dRCA 40%, distal LM  mild diffuse calcifation 20-30% tapering stenosis,   preserved LVSF ef 55-60%  . CARPAL TUNNEL RELEASE Bilateral 2005  . CATARACT EXTRACTION, BILATERAL Bilateral 2011  . CHOLECYSTECTOMY OPEN  1982   and NEPHROLITHOTOMY  . COLONOSCOPY W/ POLYPECTOMY  last one 04-05-2008  . CYSTOSCOPY WITH RETROGRADE PYELOGRAM, URETEROSCOPY AND STENT PLACEMENT Right 03/14/2014   Procedure: CYSTO/RIGHT RETROGRADE PYELOGRAM/RIGHT URETEROSCOPY/STONE EXTRACTION/STENT PLACEMENT;  Surgeon: Malka So, MD;  [facility];  Service: Urology;  Laterality: Right;  . HOLMIUM LASER APPLICATION Right 07/13/2456   Procedure: RIGHT HOLMIUM LASER APPLICATION;  Surgeon: Malka So, MD;  Location: [facility];  Service: Urology;  Laterality: Right;  . KNEE ARTHROSCOPY W/ DEBRIDEMENT Right 11-23-2002   and Synovectomy  . NEPHROLITHOTOMY  01/16/2011   Procedure: NEPHROLITHOTOMY PERCUTANEOUS;  Surgeon: Malka So;  Location: [facility];  Service: Urology;  Laterality: Left;  C-Arm  Holmium Laser  . NEPHROLITHOTOMY  1981  . SUPRAVENTRICULAR TACHYCARDIA ABLATION N/A 04/28/2014   Procedure: SUPRAVENTRICULAR TACHYCARDIA ABLATION;  Surgeon: Evans Lance, MD;  Location: [facility] CATH LAB;  Service: Cardiovascular;  Laterality: N/A;    SOCIAL HISTORY: Social History   Social History  . Marital status: Married    Spouse name: N/A  . Number of children: 2  . Years of education: N/A   Occupational History  . postal service      retired   Social History Main Topics  . Smoking status: Former Smoker    Packs/day: 1.00    Years: 40.00    Types: Cigarettes    Quit date: 02/06/1998  . Smokeless tobacco: Never Used  . Alcohol use No  . Drug use: No  . Sexual activity: Not on file   Other Topics Concern  . Not on file   Social History Narrative  . No narrative on file    FAMILY HISTORY: Family History  Problem Relation Age of Onset  . Cancer Mother        Ovarian or colon  . Heart attack Father   . Heart attack Brother     ALLERGIES:  has  No Known Allergies.  MEDICATIONS:  Current Outpatient Prescriptions  Medication Sig Dispense Refill  . aspirin 81 MG tablet Take 81 mg by mouth every other day.     [name] atorvastatin (LIPITOR) 20 MG tablet Take 20 mg by mouth daily.    . diphenhydramine-acetaminophen (TYLENOL PM) 25-500 MG TABS Take 2 tablets by mouth at bedtime.    [name] losartan (COZAAR) 50 MG tablet Take 50 mg by mouth daily.    . pantoprazole (PROTONIX) 40 MG tablet Take 40 mg by mouth every morning.     . traMADol (ULTRAM) 50 MG tablet Take 50 mg by mouth every 6 (six) hours as needed for moderate pain.      No current facility-administered medications for this visit.     REVIEW OF SYSTEMS:    10 Point review of Systems was done is negative except as noted above.  PHYSICAL EXAMINATION:  ECOG PERFORMANCE STATUS: 2 - Symptomatic, <50% confined to bed  .  Vitals:   09/30/16 1422  BP: (!) 143/59  Pulse: (!) 59  Resp: 18  Temp: 98.6 F (37 C)  SpO2: 100%   Filed Weights   09/30/16 1422  Weight: 199 lb 11.2 oz (90.6 kg)   .Body mass index is 27.85 kg/m.  GENERAL:alert, in no acute distress and comfortable SKIN: no acute rashes, no significant lesions EYES: conjunctiva are pink and non-injected, sclera anicteric OROPHARYNX: MMM, no exudates, no oropharyngeal erythema or ulceration NECK: supple, no JVD LYMPH:  no palpable lymphadenopathy in the cervical, axillary or inguinal regions LUNGS: clear to auscultation b/l with normal respiratory effort HEART: regular rate & rhythm ABDOMEN:  normoactive bowel sounds , palpable splenomegaly Extremity: no pedal edema  PSYCH: alert & oriented x 3 with fluent speech NEURO: no focal motor/sensory deficits  LABORATORY DATA:  I have reviewed the data as listed  . CBC Latest Ref Rng & Units 09/30/2016 09/18/2016 09/17/2016  WBC 4.0 - 10.3 10e3/uL 2.4(L) 1.7(L) 2.5(L)  Hemoglobin 13.0 - 17.1 g/dL 10.7(L) 9.1(L) 9.2(L)  Hematocrit 38.4 - 49.9 % 33.0(L) 27.7(L) 28.3(L)    Platelets 140 - 400 10e3/uL 103(L) 121(L) 107(L)   ANC 900  CBC    Component Value Date/Time   WBC 2.4 (L) 09/30/2016 1523   WBC 1.7 (L) 09/18/2016 0902   RBC 4.03 (L) 09/30/2016 1523   RBC 3.39 (L) 09/18/2016 0902   HGB 10.7 (L) 09/30/2016 1523   HCT 33.0 (L) 09/30/2016 1523   PLT 103 (L) 09/30/2016 1523   MCV 81.9 09/30/2016 1523   MCH 26.6 (L) 09/30/2016 1523   MCH 26.8 09/18/2016 0902   MCHC 32.4 09/30/2016 1523   MCHC 32.9 09/18/2016 0902   RDW 17.0 (H) 09/30/2016 1523   LYMPHSABS 1.2 09/30/2016 1523   MONOABS 0.2 09/30/2016 1523   EOSABS 0.0 09/30/2016 1523   BASOSABS 0.0 09/30/2016 1523     . CMP Latest Ref Rng & Units 09/17/2016 09/09/2016 09/09/2016  Glucose 70 - 140 mg/dl 90 146(H) -  BUN 7.0 - 26.0 mg/dL 17.0 16.8 -  Creatinine 0.7 - 1.3 mg/dL 1.6(H) 1.6(H) -  Sodium 136 - 145 mEq/L 141 141 -  Potassium 3.5 - 5.1 mEq/L 4.0 3.5 -  Chloride 96 - 112 mEq/L - - -  CO2 22 - 29 mEq/L 22 23 -  Calcium 8.4 - 10.4 mg/dL 8.5 8.6 -  Total Protein 6.4 - 8.3 g/dL 6.0(L) 6.2(L) 6.1  Total Bilirubin 0.20 - 1.20 mg/dL 1.71(H) 1.91(H) -  Alkaline Phos 40 - 150 U/L 142 145 -  AST 5 - 34 U/L 24 36(H) -  ALT 0 - 55 U/L 21 33 -   Component     Latest Ref Rng & Units 09/09/2016  IgG (Immunoglobin G), Serum     700 - 1,600 mg/dL 917  IgA/Immunoglobulin A, Serum     61 - 437 mg/dL 203  IgM, Qn, Serum     15 - 143 mg/dL 43  Total Protein     6.0 - 8.5 g/dL 6.1  Albumin SerPl Elph-Mcnc     2.9 - 4.4 g/dL 3.7  Alpha 1     0.0 - 0.4 g/dL 0.3  Alpha2 Glob SerPl Elph-Mcnc     0.4 - 1.0 g/dL 0.5  B-Globulin SerPl Elph-Mcnc     0.7 - 1.3 g/dL 0.8  Gamma Glob SerPl Elph-Mcnc     0.4 - 1.8 g/dL 0.7  M Protein SerPl Elph-Mcnc     Not Observed g/dL Not Observed  Globulin, Total     2.2 - 3.9 g/dL 2.4  Albumin/Glob SerPl     0.7 - 1.7 1.6  IFE 1      Comment  Please Note (HCV):      Comment  Iron     42 - 163 ug/dL 64  TIBC     202 - 409 ug/dL 194 (L)  UIBC     117 -  376 ug/dL 130  %SAT     20 - 55 % 33  Folate, Hemolysate     Not Estab. ng/mL 369.8  HCT     37.5 - 51.0 % 28.0 (L)  Folate, RBC     >498 ng/mL 1,321  Ig Kappa Free Light Chain     3.3 - 19.4 mg/L 21.4 (H)  Ig Lambda Free Light Chain     5.7 - 26.3 mg/L 36.6 (H)  Kappa/Lambda FluidC Ratio     0.26 - 1.65 0.58  LDH     125 - 245 U/L 326 (H)  Sed Rate     0 - 30 mm/hr 2  Vitamin B12     232 - 1245 pg/mL 1,261 (H)  TSH     0.320 - 4.118 m(IU)/L 2.609  Hep C Virus Ab     0.0 - 0.9 s/co ratio 0.1  Ferritin     22 - 316 ng/ml 399 (H)   Component     Latest Ref Rng & Units 09/17/2016 09/18/2016  Prothrombin Time     11.4 - 15.2 seconds  14.2  INR       1.11  LDH     125 - 245 U/L 309 (H)   Haptoglobin     34 - 200 mg/dL <10 (L)   Coombs', Direct     Negative Negative   APTT     24 - 36 seconds  32   Component     Latest Ref Rng & Units 09/30/2016  RA Latex Turbid.     0.0 - 13.9 IU/mL <10.0  CCP Antibodies IgG/IgA     0 - 19 units 12  ANA Ab, IFA      Negative  Sed Rate     0 - 30 mm/hr 2       RADIOGRAPHIC STUDIES: I have personally reviewed the radiological images as listed and agreed with the findings in the report. US Abdomen Complete  Result Date: 09/09/2016 CLINICAL DATA:  Elevated alkaline phosphatase. Renal insufficiency. Cholecystectomy. Splenomegaly. EXAM: ABDOMEN ULTRASOUND COMPLETE COMPARISON:  Ultrasound 11/16/2014. FINDINGS: Gallbladder: Cholecystectomy. Common bile duct: Diameter: 2.6 mm Liver: No focal lesion identified. Within normal limits in parenchymal echogenicity. Portal vein is patent on color Doppler imaging with normal direction of blood flow towards the liver. IVC: No abnormality visualized. Pancreas: Visualized portion unremarkable. Spleen: Splenomegaly at 18 cm with a volume of 1407 cc. Right Kidney: Length: 10.0 cm. Echogenicity within normal limits. No hydronephrosis visualized. 4.3 cm and 1.4 cm simple cyst lower pole right kidney. Left  Kidney: Length: 10.0 cm. Echogenicity within normal limits. No mass or hydronephrosis visualized. Abdominal aorta: Abdominal aortic aneurysm at 3.7 cm. Other findings: None. IMPRESSION: 1. Cholecystectomy. No biliary distention. No focal hepatic abnormality identified. Liver echotexture normal. 2.  Severe splenomegaly. 3.  Simple cysts left kidney. 4.  3.7 cm abdominal aortic aneurysm. Electronically Signed   By: Marcello Moores  Register   On: 09/09/2016 12:49   Nm Pet Image Initial (pi) Skull Base To Thigh  Result Date: 09/26/2016 CLINICAL DATA:  Pancytopenia and splenomegaly. Evaluate for lymphoma. EXAM: NUCLEAR MEDICINE PET SKULL BASE TO THIGH Fischer: 10.68 mCi F-18 FDG was injected intravenously. Full-ring PET imaging was performed from the skull base to thigh after the radiotracer. CT data was obtained and used for attenuation correction and anatomic localization. FASTING BLOOD GLUCOSE:  Value: 91 mg/dl COMPARISON:  None. FINDINGS: NECK: No hypermetabolic lymph nodes in the neck. CHEST: No hypermetabolic mediastinal or hilar nodes. No suspicious pulmonary nodules on the CT scan. ABDOMEN/PELVIS: The spleen is enlarged. However, the uptake in the spleen is less than that seen within the liver, within normal limits. No abnormal FDG uptake, focal or diffuse, is seen within the spleen. Uptake in the distal stomach and throughout the colon is thought to be physiologic. No FDG avid adenopathy is seen within the abdomen or pelvis. No other abnormal FDG uptake is seen in the abdomen or pelvis SKELETON: Increased uptake is seen throughout the bone marrow diffusely, including all visualized bones. The uptake is mildly heterogeneous. IMPRESSION: 1. Diffuse increased mildly heterogeneous uptake throughout all visualized bones. This is nonspecific and could be seen with anemia, treatment for the patient's known pancytopenia, or an infiltrative process. 2. Splenomegaly. The uptake in the spleen is within normal limits,  however. 3. No other abnormal uptake on today's study. Electronically Signed   By: Dorise Bullion III M.D   On: 09/26/2016 14:29   Ct Biopsy  Result Date: 09/18/2016 INDICATION: Pancytopenia EXAM: CT BIOPSY; CT BONE MARROW BIOPSY AND ASPIRATION MEDICATIONS: None. ANESTHESIA/SEDATION: Fentanyl 50 mcg IV; Versed 1 mg IV Moderate Sedation Time:  10 The patient was continuously monitored during the procedure by the interventional radiology nurse under my direct supervision. FLUOROSCOPY TIME:  Fluoroscopy Time:  minutes  seconds ( mGy). COMPLICATIONS: None immediate. PROCEDURE: Informed written consent was obtained from the patient after a thorough discussion of the procedural risks, benefits and alternatives. All questions were addressed. Brandon Sterile Barrier Fischer was utilized including caps, mask, sterile gowns, sterile gloves, sterile drape, hand hygiene and skin antiseptic. A timeout was performed prior to the initiation of the procedure. Under CT guidance, a(n) 11 gauge guide needle was advanced into the right iliac bone. Aspirates and a core were obtained Post biopsy images demonstrate no hemorrhage. Patient tolerated the procedure well without complication. Vital sign monitoring by nursing staff during the procedure will continue as patient is in the special procedures unit for post procedure observation. FINDINGS: The images document guide needle placement within the right iliac bone. Post biopsy images demonstrate no hemorrhage. IMPRESSION: Successful CT-guided bone marrow aspirate and core. Electronically Signed   By: Marybelle Killings M.D.   On: 09/18/2016 12:10   Ct Bone Marrow Biopsy & Aspiration  Result Date: 09/18/2016 INDICATION: Pancytopenia EXAM: CT BIOPSY; CT BONE MARROW BIOPSY AND ASPIRATION MEDICATIONS: None. ANESTHESIA/SEDATION: Fentanyl 50 mcg IV; Versed 1 mg IV Moderate Sedation Time:  10 The patient was continuously monitored during the procedure by the interventional radiology nurse  under my direct supervision. FLUOROSCOPY TIME:  Fluoroscopy Time:  minutes  seconds ( mGy). COMPLICATIONS: None immediate. PROCEDURE: Informed written consent was obtained from the patient after a thorough discussion of the procedural risks, benefits and alternatives. All questions were addressed. Brandon Sterile Barrier Fischer was utilized including caps, mask, sterile gowns, sterile gloves, sterile drape, hand hygiene and skin antiseptic. A timeout was performed prior to the initiation of the procedure. Under CT guidance, a(n) 11 gauge guide needle was advanced into the right iliac bone. Aspirates and a  core were obtained Post biopsy images demonstrate no hemorrhage. Patient tolerated the procedure well without complication. Vital sign monitoring by nursing staff during the procedure will continue as patient is in the special procedures unit for post procedure observation. FINDINGS: The images document guide needle placement within the right iliac bone. Post biopsy images demonstrate no hemorrhage. IMPRESSION: Successful CT-guided bone marrow aspirate and core. Electronically Signed   By: Marybelle Killings M.D.   On: 09/18/2016 12:10    ASSESSMENT & PLAN:   #1 Pancytopenia with normocytic Anemia, neutropenia and thrombocytopenia This could certainly be from the patient's splenomegaly causing hypersplenism. Etiology of the splenomegaly is unclear t  Bone marrow showed no overt evidence of MDS/Myelofibrosis no evidence of acute leukemia Increased CD8+ T cell population ? Reactive vs clonal. R/o T-LGL Sed rate WNL neg ANA and Rheumatoid panel  No evidence of monoclonal paraproteinemia. TSH levels within normal limits B12, folate and iron levels within normal limits. Hepatitis C negative  #2 Elevated LDH With mild elevated bilirubin cannot r/o some hemolysis though that would not explain leucopenia/neutropenia or thrombocytopenia and splenomegaly by itself LDH slightly lower. Haptoglobin <10 ---  suggestive of some hemolysis. Coombs neg.   PLAN PET and Bone Marrow Biopsy reviewed in detail. PET showed no obvious signs of enlarged lymph nodes or masses. Bone Marrow Biopsy showed large clusters of T lymphocytes.  - Rheumatoid Arthritis panel , ANA reviewed - neg. -TCR gene rearrangement to establish clonality. -PNH flow cytometry -f/u BM Cytogenetics -if clonal T-LGL population and patient cytopenic/neutropenic might need to consider MTX   Labs today, follow up with results RTC with Dr Irene Limbo in 3 weeks with labs   Orders Placed This Encounter  Procedures  . CBC & Diff and Retic    Standing Status:   Future    Number of Occurrences:   1    Standing Expiration Date:   09/30/2017  . Smear    Standing Status:   Future    Number of Occurrences:   1    Standing Expiration Date:   09/30/2017  . T Cell Lymphoma, PCR    T cell receptor gene rearrangement    Standing Status:   Future    Number of Occurrences:   1    Standing Expiration Date:   09/30/2017  . ANA, IFA (with reflex)    Standing Status:   Future    Number of Occurrences:   1    Standing Expiration Date:   09/30/2017  . Sedimentation rate    Standing Status:   Future    Number of Occurrences:   1    Standing Expiration Date:   09/30/2017  . Rheumatoid Arthritis Profile    Standing Status:   Future    Number of Occurrences:   1    Standing Expiration Date:   09/30/2017  . CBC & Diff and Retic    Standing Status:   Future    Standing Expiration Date:   09/30/2017  . Comprehensive metabolic panel    Standing Status:   Future    Standing Expiration Date:   09/30/2017  . Lactate dehydrogenase    Standing Status:   Future    Standing Expiration Date:   09/30/2017  . PNH Profile (-High Sensitivity)    Standing Status:   Future    Number of Occurrences:   1    Standing Expiration Date:   11/04/2017    All of the patients questions were answered with apparent  satisfaction. The patient knows to call the clinic with any  problems, questions or concerns.  I spent 20 minutes counseling the patient face to face. The total time spent in the appointment was 25 minutes and more than 50% was on counseling and direct patient cares.  This document serves as a record of services personally performed by Sullivan Lone, MD. It was created on his behalf by Brandt Loosen, a trained medical scribe. The creation of this record is based on the scribe's personal observations and the provider's statements to them. This document has been checked and approved by the attending provider.    Sullivan Lone MD [place] AAHIVMS [facility] Hematology/Oncology Physician [facility]  (Office):       [phone] (Work cell):  [phone] (Fax):           [phone]

## 2016-09-30 ENCOUNTER — Telehealth: Payer: Self-pay | Admitting: Hematology

## 2016-09-30 ENCOUNTER — Encounter: Payer: Self-pay | Admitting: Hematology

## 2016-09-30 ENCOUNTER — Ambulatory Visit (HOSPITAL_BASED_OUTPATIENT_CLINIC_OR_DEPARTMENT_OTHER): Payer: Medicare Other

## 2016-09-30 ENCOUNTER — Ambulatory Visit (HOSPITAL_BASED_OUTPATIENT_CLINIC_OR_DEPARTMENT_OTHER): Payer: Medicare Other | Admitting: Hematology

## 2016-09-30 VITALS — BP 143/59 | HR 59 | Temp 98.6°F | Resp 18 | Wt 199.7 lb

## 2016-09-30 DIAGNOSIS — D709 Neutropenia, unspecified: Secondary | ICD-10-CM | POA: Diagnosis not present

## 2016-09-30 DIAGNOSIS — D696 Thrombocytopenia, unspecified: Secondary | ICD-10-CM | POA: Diagnosis not present

## 2016-09-30 DIAGNOSIS — D61818 Other pancytopenia: Secondary | ICD-10-CM

## 2016-09-30 DIAGNOSIS — R740 Nonspecific elevation of levels of transaminase and lactic acid dehydrogenase [LDH]: Secondary | ICD-10-CM

## 2016-09-30 DIAGNOSIS — M25551 Pain in right hip: Secondary | ICD-10-CM | POA: Diagnosis not present

## 2016-09-30 DIAGNOSIS — D72820 Lymphocytosis (symptomatic): Secondary | ICD-10-CM | POA: Diagnosis not present

## 2016-09-30 DIAGNOSIS — D649 Anemia, unspecified: Secondary | ICD-10-CM | POA: Diagnosis not present

## 2016-09-30 DIAGNOSIS — R161 Splenomegaly, not elsewhere classified: Secondary | ICD-10-CM | POA: Diagnosis not present

## 2016-09-30 DIAGNOSIS — R7402 Elevation of levels of lactic acid dehydrogenase (LDH): Secondary | ICD-10-CM

## 2016-09-30 LAB — CBC & DIFF AND RETIC
BASO%: 0.4 % (ref 0.0–2.0)
Basophils Absolute: 0 10*3/uL (ref 0.0–0.1)
EOS%: 0.8 % (ref 0.0–7.0)
Eosinophils Absolute: 0 10*3/uL (ref 0.0–0.5)
HCT: 33 % — ABNORMAL LOW (ref 38.4–49.9)
HGB: 10.7 g/dL — ABNORMAL LOW (ref 13.0–17.1)
Immature Retic Fract: 4 % (ref 3.00–10.60)
LYMPH#: 1.2 10*3/uL (ref 0.9–3.3)
LYMPH%: 51.3 % — ABNORMAL HIGH (ref 14.0–49.0)
MCH: 26.6 pg — AB (ref 27.2–33.4)
MCHC: 32.4 g/dL (ref 32.0–36.0)
MCV: 81.9 fL (ref 79.3–98.0)
MONO#: 0.2 10*3/uL (ref 0.1–0.9)
MONO%: 10.2 % (ref 0.0–14.0)
NEUT#: 0.9 10*3/uL — ABNORMAL LOW (ref 1.5–6.5)
NEUT%: 37.3 % — AB (ref 39.0–75.0)
PLATELETS: 103 10*3/uL — AB (ref 140–400)
RBC: 4.03 10*6/uL — AB (ref 4.20–5.82)
RDW: 17 % — ABNORMAL HIGH (ref 11.0–14.6)
RETIC CT ABS: 81.41 10*3/uL (ref 34.80–93.90)
Retic %: 2.02 % — ABNORMAL HIGH (ref 0.80–1.80)
WBC: 2.4 10*3/uL — ABNORMAL LOW (ref 4.0–10.3)

## 2016-09-30 LAB — CHCC SMEAR

## 2016-09-30 NOTE — Patient Instructions (Signed)
Thank you for choosing [facility] to provide your oncology and hematology care.  To afford each patient quality time with our providers, please arrive 30 minutes before your scheduled appointment time.  If you arrive late for your appointment, you may be asked to reschedule.  We strive to give you quality time with our providers, and arriving late affects you and other patients whose appointments are after yours.   If you are a no show for multiple scheduled visits, you may be dismissed from the clinic at the providers discretion.    Again, thank you for choosing [facility], our hope is that these requests will decrease the amount of time that you wait before being seen by our physicians.  ______________________________________________________________________  Should you have questions after your visit to the [facility], please contact our office at [phone] between the hours of 8:30 and 4:30 p.m.    Voicemails left after 4:30p.m will not be returned until the following business day.    For prescription refill requests, please have your pharmacy contact us directly.  Please also try to allow 48 hours for prescription requests.    Please contact the scheduling department for questions regarding scheduling.  For scheduling of procedures such as PET scans, CT scans, MRI, Ultrasound, etc please contact central scheduling at [phone].    Resources For Cancer Patients and Caregivers:   Oncolink.org:  A wonderful resource for patients and healthcare providers for information regarding your disease, ways to tract your treatment, what to expect, etc.     American Cancer Society:  800-227-2345  Can help patients locate various types of support and financial assistance  Cancer Care: 1-800-813-HOPE (4673) Provides financial assistance, online support groups, medication/co-pay assistance.    [place] DSS:  336-641-3447 Where to apply for food  stamps, Medicaid, and utility assistance  Medicare Rights Center: 800-333-4114 Helps people with Medicare understand their rights and benefits, navigate the Medicare system, and secure the quality healthcare they deserve  SCAT: 336-333-6589 [place] [employer]'s shared-ride transportation service for eligible riders who have a disability that prevents them from riding the fixed route bus.    For additional information on assistance programs please contact our social worker:   Grier Hock/Abigail Elmore:  [phone]            

## 2016-09-30 NOTE — Telephone Encounter (Signed)
Scheduled appt per 9/18 los - Gave patient avs and calender per los.

## 2016-10-01 LAB — RHEUMATOID ARTHRITIS PROFILE: CCP ANTIBODIES IGG/IGA: 12 U (ref 0–19)

## 2016-10-01 LAB — SEDIMENTATION RATE: Sedimentation Rate-Westergren: 2 mm/hr (ref 0–30)

## 2016-10-01 LAB — ANTINUCLEAR ANTIBODIES, IFA: ANA Titer 1: NEGATIVE

## 2016-10-03 LAB — PNH PROFILE (-HIGH SENSITIVITY): Viability:: 97

## 2016-10-07 ENCOUNTER — Encounter (HOSPITAL_COMMUNITY): Payer: Self-pay

## 2016-10-08 DIAGNOSIS — D225 Melanocytic nevi of trunk: Secondary | ICD-10-CM | POA: Diagnosis not present

## 2016-10-08 DIAGNOSIS — L814 Other melanin hyperpigmentation: Secondary | ICD-10-CM | POA: Diagnosis not present

## 2016-10-08 DIAGNOSIS — Z85820 Personal history of malignant melanoma of skin: Secondary | ICD-10-CM | POA: Diagnosis not present

## 2016-10-08 DIAGNOSIS — L821 Other seborrheic keratosis: Secondary | ICD-10-CM | POA: Diagnosis not present

## 2016-10-09 DIAGNOSIS — R161 Splenomegaly, not elsewhere classified: Secondary | ICD-10-CM | POA: Diagnosis not present

## 2016-10-09 DIAGNOSIS — Z23 Encounter for immunization: Secondary | ICD-10-CM | POA: Diagnosis not present

## 2016-10-09 DIAGNOSIS — D61818 Other pancytopenia: Secondary | ICD-10-CM | POA: Diagnosis not present

## 2016-10-09 DIAGNOSIS — I714 Abdominal aortic aneurysm, without rupture: Secondary | ICD-10-CM | POA: Diagnosis not present

## 2016-10-09 DIAGNOSIS — I10 Essential (primary) hypertension: Secondary | ICD-10-CM | POA: Diagnosis not present

## 2016-10-10 LAB — CHROMOSOME ANALYSIS, BONE MARROW

## 2016-10-21 NOTE — Progress Notes (Signed)
[name]    HEMATOLOGY/ONCOLOGY CLINIC NOTE  Date of Service: 10/22/2016  Patient Care Team: Deland Pretty, MD as PCP - General (Internal Medicine)  CHIEF COMPLAINTS/PURPOSE OF CONSULTATION:  Anemia and leucopenia  HISTORY OF PRESENTING ILLNESS:   Brandon Fischer is a wonderful 83 y.o. male  who has been referred to [place] by Dr .Deland Pretty, MD for evaluation and management of anemia and leukopenia.  Patient has a history of hypertension, dyslipidemia, atrial fibrillation, coronary artery disease, bilateral carotid artery stenosis, paroxysmal supraventricular tachycardias status post radiofrequency ablation, GERD who on recent labs with his primary care physician on 09/01/2016 which showed new anemia with a hemoglobin of 9.3 with an MCV of 82.9 and RDW of 18.5. WBC count of 2.1k with an ANC of about 700 and mild thrombocytopenia of 119k.  CMP showed bilirubin of 1.7 with an alkaline phosphatase of 154 normal AST ALT creatinine 1.9 with BUN of 24.  Patient subsequently had an ultrasound of the abdomen done on 09/09/2016 for evaluation of his abnormal liver function tests and renal insufficiency and was noted to have severe splenomegaly with this plane off 18 cm in size with a volume of 1407 mL. No focal hepatic abnormalities. No bladder distention.. 3.7 cm abdominal aortic aneurysm.  Repeat labs done in clinic today show a slight improvement in WBC count of 3.6k with an ANC of 1000. Hemoglobin 9.4 and platelets of 108k . Patient has a obvious deep palpable spleen on clinical examination. Overtly palpable lymphadenopathy noted.  Patient notes no overt weight loss fevers chills or night sweats.  No change in mental status. No chest pain no shortness of breath no significant abdominal discomfort headaches. No evidence of bleeding. No issues with recent infections. Some fatigue.  INTERVAL HISTORY  Pt presents to the office today for f/u of his pancytopenia accompanied by his wife. His hbg  has improved to 11.9, plt are at 110k as of today 10/22/2016. He reports that he is doing well overall. He states that he is feeling much better. He has regained his appetite and is no longer SOB.  He is asymptomatic at this time.  TCR gene rearrangement studies indeterminate.  On review of systems, denies SOB, loss of appetite, fever, chills, abdominal pain, changes in BM, and any other acute accompanying symptoms    MEDICAL HISTORY:  Past Medical History:  Diagnosis Date  . At risk for sleep apnea    STOP-BANG= 4    SENT TO PCP 02-06-2014  . Bilateral carotid artery stenosis without cerebral infarction    ASYMPTOMATIC--  BILATERAL ICA  50-69%  PER CARDIOLOGIST NOTE, DR Einar Gip  . Bilateral carotid bruits    LEFT > RIGHT  . Coronary artery disease cardiologist-- dr Stevphen [place] CAD  per cath 08-06-2004 HX PALPITATIONS-AND -TACHYCARDIA-  . First degree heart block   . Full dentures   . GERD (gastroesophageal reflux disease)   . Heart palpitations    Not any more  . History of atrial fibrillation without current medication    EPISODE OF AFIB WITH RVR INTRAOPERATIVELY 02-09-2014 AT [facility]--  RESOLVED AND PT FOLLOWED UP WITH CARDIOLOGIST  DR Einar Gip  . History of kidney stones   . Hyperlipidemia   . Hypertension   . Nephrolithiasis    RIGHT  . Organic impotence   . Psoriasis    Clearing up  . PSVT (paroxysmal supraventricular tachycardia) ([place])   . Right ureteral calculus   . S/P radiofrequency ablation operation for arrhythmia-SVT 04/28/2014  .  Simple renal cyst    right  . Wears glasses   . Wears hearing aid    BILATERAL  History of malignant melanoma status post Mohs surgery to remove the skin lesion on the face with skin grafting. 3 years ago.  SURGICAL HISTORY: Past Surgical History:  Procedure Laterality Date  . CARDIAC CATHETERIZATION  08-06-2004 dr Verlon Setting   (abnormal stress test) Non-obstructive CAD, pLAD 20-30%,  LCX 30-40%, pRCA 20-30%, dRCA 40%, distal LM   mild diffuse calcifation 20-30% tapering stenosis,  preserved LVSF ef 55-60%  . CARPAL TUNNEL RELEASE Bilateral 2005  . CATARACT EXTRACTION, BILATERAL Bilateral 2011  . CHOLECYSTECTOMY OPEN  1982   and NEPHROLITHOTOMY  . COLONOSCOPY W/ POLYPECTOMY  last one 04-05-2008  . CYSTOSCOPY WITH RETROGRADE PYELOGRAM, URETEROSCOPY AND STENT PLACEMENT Right 03/14/2014   Procedure: CYSTO/RIGHT RETROGRADE PYELOGRAM/RIGHT URETEROSCOPY/STONE EXTRACTION/STENT PLACEMENT;  Surgeon: Malka So, MD;  [facility];  Service: Urology;  Laterality: Right;  . HOLMIUM LASER APPLICATION Right 06/19/8936   Procedure: RIGHT HOLMIUM LASER APPLICATION;  Surgeon: Malka So, MD;  Location: [facility];  Service: Urology;  Laterality: Right;  . KNEE ARTHROSCOPY W/ DEBRIDEMENT Right 11-23-2002   and Synovectomy  . NEPHROLITHOTOMY  01/16/2011   Procedure: NEPHROLITHOTOMY PERCUTANEOUS;  Surgeon: Malka So;  Location: [facility];  Service: Urology;  Laterality: Left;  C-Arm  Holmium Laser  . NEPHROLITHOTOMY  1981  . SUPRAVENTRICULAR TACHYCARDIA ABLATION N/A 04/28/2014   Procedure: SUPRAVENTRICULAR TACHYCARDIA ABLATION;  Surgeon: Evans Lance, MD;  Location: [facility] C[facility] LAB;  Service: Cardiovascular;  Laterality: N/A;    SOCIAL HISTORY: Social History   Social History  . Marital status: Married    Spouse name: N/A  . Number of children: 2  . Years of education: N/A   Occupational History  . postal service      retired   Social History Main Topics  . Smoking status: Former Smoker    Packs/day: 1.00    Years: 40.00    Types: Cigarettes    Quit date: 02/06/1998  . Smokeless tobacco: Never Used  . Alcohol use No  . Drug use: No  . Sexual activity: Not on file   Other Topics Concern  . Not on file   Social History Narrative  . No narrative on file    FAMILY HISTORY: Family History  Problem Relation Age of Onset  . Cancer Mother        Ovarian or colon  . Heart attack  Father   . Heart attack Brother     ALLERGIES:  has No Known Allergies.  MEDICATIONS:  Current Outpatient Prescriptions  Medication Sig Dispense Refill  . aspirin 81 MG tablet Take 81 mg by mouth every other day.     [name] atorvastatin (LIPITOR) 20 MG tablet Take 20 mg by mouth daily.    . diphenhydramine-acetaminophen (TYLENOL PM) 25-500 MG TABS Take 2 tablets by mouth at bedtime.    [name] losartan (COZAAR) 50 MG tablet Take 50 mg by mouth daily.    . pantoprazole (PROTONIX) 40 MG tablet Take 40 mg by mouth every morning.     . traMADol (ULTRAM) 50 MG tablet Take 50 mg by mouth every 6 (six) hours as needed for moderate pain.      No current facility-administered medications for this visit.     REVIEW OF SYSTEMS:    10 Point review of Systems was done is negative except as noted above.  PHYSICAL EXAMINATION:  ECOG PERFORMANCE STATUS: 2 - Symptomatic, <50% confined to bed  . Vitals:   10/22/16 0939  BP: (!) 174/51  Pulse: 62  Resp: 17  Temp: 97.9 F (36.6 C)  SpO2: 100%   Filed Weights   10/22/16 0939  Weight: 202 lb 6.4 oz (91.8 kg)   .Body mass index is 28.23 kg/m.  GENERAL:alert, in no acute distress and comfortable SKIN: no acute rashes, no significant lesions EYES: conjunctiva are pink and non-injected, sclera anicteric OROPHARYNX: MMM, no exudates, no oropharyngeal erythema or ulceration NECK: supple, no JVD LYMPH:  no palpable lymphadenopathy in the cervical, axillary or inguinal regions LUNGS: clear to auscultation b/l with normal respiratory effort HEART: regular rate & rhythm ABDOMEN:  normoactive bowel sounds , palpable splenomegaly Extremity: no pedal edema  PSYCH: alert & oriented x 3 with fluent speech NEURO: no focal motor/sensory deficits  LABORATORY DATA:  I have reviewed the data as listed  . CBC Latest Ref Rng & Units 10/22/2016 09/30/2016 09/18/2016  WBC 4.0 - 10.3 10e3/uL 2.8(L) 2.4(L) 1.7(L)  Hemoglobin 13.0 - 17.1 g/dL 11.9(L) 10.7(L) 9.1(L)    Hematocrit 38.4 - 49.9 % 36.2(L) 33.0(L) 27.7(L)  Platelets 140 - 400 10e3/uL 110(L) 103(L) 121(L)   ANC 1800  CBC    Component Value Date/Time   WBC 2.8 (L) 10/22/2016 0853   WBC 1.7 (L) 09/18/2016 0902   RBC 4.50 10/22/2016 0853   RBC 3.39 (L) 09/18/2016 0902   HGB 11.9 (L) 10/22/2016 0853   HCT 36.2 (L) 10/22/2016 0853   PLT 110 (L) 10/22/2016 0853   MCV 80.4 10/22/2016 0853   MCH 26.4 (L) 10/22/2016 0853   MCH 26.8 09/18/2016 0902   MCHC 32.9 10/22/2016 0853   MCHC 32.9 09/18/2016 0902   RDW 16.1 (H) 10/22/2016 0853   LYMPHSABS 0.7 (L) 10/22/2016 0853   MONOABS 0.3 10/22/2016 0853   EOSABS 0.1 10/22/2016 0853   BASOSABS 0.0 10/22/2016 0853     . CMP Latest Ref Rng & Units 10/22/2016 09/17/2016 09/09/2016  Glucose 70 - 140 mg/dl 102 90 146(H)  BUN 7.0 - 26.0 mg/dL 11.1 17.0 16.8  Creatinine 0.7 - 1.3 mg/dL 1.2 1.6(H) 1.6(H)  Sodium 136 - 145 mEq/L 143 141 141  Potassium 3.5 - 5.1 mEq/L 3.6 4.0 3.5  Chloride 96 - 112 mEq/L - - -  CO2 22 - 29 mEq/L 24 22 23   Calcium 8.4 - 10.4 mg/dL 8.5 8.5 8.6  Total Protein 6.4 - 8.3 g/dL 6.2(L) 6.0(L) 6.2(L)  Total Bilirubin 0.20 - 1.20 mg/dL 1.18 1.71(H) 1.91(H)  Alkaline Phos 40 - 150 U/L 104 142 145  AST 5 - 34 U/L 15 24 36(H)  ALT 0 - 55 U/L 12 21 33    Component     Latest Ref Rng & Units 09/09/2016  IgG (Immunoglobin G), Serum     700 - 1,600 mg/dL 917  IgA/Immunoglobulin A, Serum     61 - 437 mg/dL 203  IgM, Qn, Serum     15 - 143 mg/dL 43  Total Protein     6.0 - 8.5 g/dL 6.1  Albumin SerPl Elph-Mcnc     2.9 - 4.4 g/dL 3.7  Alpha 1     0.0 - 0.4 g/dL 0.3  Alpha2 Glob SerPl Elph-Mcnc     0.4 - 1.0 g/dL 0.5  B-Globulin SerPl Elph-Mcnc     0.7 - 1.3 g/dL 0.8  Gamma Glob SerPl Elph-Mcnc     0.4 - 1.8 g/dL 0.7  M  Protein SerPl Elph-Mcnc     Not Observed g/dL Not Observed  Globulin, Total     2.2 - 3.9 g/dL 2.4  Albumin/Glob SerPl     0.7 - 1.7 1.6  IFE 1      Comment  Please Note (HCV):      Comment   Iron     42 - 163 ug/dL 64  TIBC     202 - 409 ug/dL 194 (L)  UIBC     117 - 376 ug/dL 130  %SAT     20 - 55 % 33  Folate, Hemolysate     Not Estab. ng/mL 369.8  HCT     37.5 - 51.0 % 28.0 (L)  Folate, RBC     >498 ng/mL 1,321  Ig Kappa Free Light Chain     3.3 - 19.4 mg/L 21.4 (H)  Ig Lambda Free Light Chain     5.7 - 26.3 mg/L 36.6 (H)  Kappa/Lambda FluidC Ratio     0.26 - 1.65 0.58  LDH     125 - 245 U/L 326 (H)  Sed Rate     0 - 30 mm/hr 2  Vitamin B12     232 - 1245 pg/mL 1,261 (H)  TSH     0.320 - 4.118 m(IU)/L 2.609  Hep C Virus Ab     0.0 - 0.9 s/co ratio 0.1  Ferritin     22 - 316 ng/ml 399 (H)   Component     Latest Ref Rng & Units 09/17/2016 09/18/2016  Prothrombin Time     11.4 - 15.2 seconds  14.2  INR       1.11  LDH     125 - 245 U/L 309 (H)   Haptoglobin     34 - 200 mg/dL <10 (L)   Coombs', Direct     Negative Negative   APTT     24 - 36 seconds  32   Component     Latest Ref Rng & Units 09/30/2016  RA Latex Turbid.     0.0 - 13.9 IU/mL <10.0  CCP Antibodies IgG/IgA     0 - 19 units 12  ANA Ab, IFA      Negative  Sed Rate     0 - 30 mm/hr 2   Component     Latest Ref Rng & Units 09/30/2016 09/30/2016 09/30/2016 10/22/2016         3:23 PM  3:23 PM  3:23 PM   Interpretation       Comment    Comment:       Comment Comment   Specimen:       Peripheral Blood    Submitted Dx:       Comment    Viability:       97%    Cell Population       Comment    Granulocytes:       Comment    Monocytes:       Comment    Antibodies Performed:       Comment    Director Review       Comment    RA Latex Turbid.     0.0 - 13.9 IU/mL <10.0     CCP Antibodies IgG/IgA     0 - 19 units 12     ANA Ab, IFA      Negative     LDH     125 - 245 U/L  185           RADIOGRAPHIC STUDIES: I have personally reviewed the radiological images as listed and agreed with the findings in the report. Nm Pet Image Initial (pi) Skull Base To  Thigh  Result Date: 09/26/2016 CLINICAL DATA:  Pancytopenia and splenomegaly. Evaluate for lymphoma. EXAM: NUCLEAR MEDICINE PET SKULL BASE TO THIGH TECHNIQUE: 10.68 mCi F-18 FDG was injected intravenously. Full-ring PET imaging was performed from the skull base to thigh after the radiotracer. CT data was obtained and used for attenuation correction and anatomic localization. FASTING BLOOD GLUCOSE:  Value: 91 mg/dl COMPARISON:  None. FINDINGS: NECK: No hypermetabolic lymph nodes in the neck. CHEST: No hypermetabolic mediastinal or hilar nodes. No suspicious pulmonary nodules on the CT scan. ABDOMEN/PELVIS: The spleen is enlarged. However, the uptake in the spleen is less than that seen within the liver, within normal limits. No abnormal FDG uptake, focal or diffuse, is seen within the spleen. Uptake in the distal stomach and throughout the colon is thought to be physiologic. No FDG avid adenopathy is seen within the abdomen or pelvis. No other abnormal FDG uptake is seen in the abdomen or pelvis SKELETON: Increased uptake is seen throughout the bone marrow diffusely, including all visualized bones. The uptake is mildly heterogeneous. IMPRESSION: 1. Diffuse increased mildly heterogeneous uptake throughout all visualized bones. This is nonspecific and could be seen with anemia, treatment for the patient's known pancytopenia, or an infiltrative process. 2. Splenomegaly. The uptake in the spleen is within normal limits, however. 3. No other abnormal uptake on today's study. Electronically Signed   By: Dorise Bullion III M.D   On: 09/26/2016 14:29    ASSESSMENT & PLAN:   #1 Pancytopenia with normocytic Anemia, neutropenia and thrombocytopenia This could certainly be from the patient's splenomegaly causing hypersplenism. Etiology of the splenomegaly is unclear.  Bone marrow showed no overt evidence of MDS/Myelofibrosis no evidence of acute leukemia Increased CD8+ T cell population ? Reactive vs clonal. Normal  cytogenetics. Sed rate WNL neg ANA and Rheumatoid panel  No evidence of monoclonal paraproteinemia. TSH levels within normal limits B12, folate and iron levels within normal limits. Hepatitis C negative  Given improvement in counts - the T cell population likely reactive and could be related to a viral infection that appears to be resolving now. #2 Elevated LDH With mild elevated bilirubin cannot r/o some hemolysis though that would not explain leucopenia/neutropenia or thrombocytopenia and splenomegaly by itself LDH slightly lower. Haptoglobin <10 --- suggestive of some hemolysis. Coombs neg.  LDH levels have now normalized PLAN PET and Bone Marrow Biopsy reviewed in detail. PET showed no obvious signs of enlarged lymph nodes or masses. Bone Marrow Biopsy showed large clusters of T lymphocytes.  - Rheumatoid Arthritis panel , ANA reviewed - neg. -TCR gene rearrangement to establish clonality was indeterminated -[facility] flow cytometry neg  -Counts have significantly improved suggesting that this could be a resolving viral infection related reactive process.  No interventions recommended at this time. -we will plan to repeat labs in 2 months.   RTC with Dr Irene Limbo in 2 months with labs  Orders Placed This Encounter  Procedures  . CBC & Diff and Retic    Standing Status:   Future    Standing Expiration Date:   10/22/2017  . Comprehensive metabolic panel    Standing Status:   Future    Standing Expiration Date:   10/22/2017    All of the patients questions were answered with apparent satisfaction.  The patient knows to call the clinic with any problems, questions or concerns.  I spent 20 minutes counseling the patient face to face. The total time spent in the appointment was 25 minutes and more than 50% was on counseling and direct patient cares.    Sullivan Lone MD [place] AAHIVMS [facility] Hematology/Oncology Physician [facility]  (Office):       [phone] (Work cell):   [phone] (Fax):           [phone]   This document serves as a record of services personally performed by Sullivan Lone, MD. It was created on her behalf by Alean Rinne, a trained medical scribe. The creation of this record is based on the scribe's personal observations and the provider's statements to them. This document has been checked and approved by the attending provider.

## 2016-10-22 ENCOUNTER — Ambulatory Visit (HOSPITAL_BASED_OUTPATIENT_CLINIC_OR_DEPARTMENT_OTHER): Payer: Medicare Other | Admitting: Hematology

## 2016-10-22 ENCOUNTER — Other Ambulatory Visit (HOSPITAL_BASED_OUTPATIENT_CLINIC_OR_DEPARTMENT_OTHER): Payer: Medicare Other

## 2016-10-22 ENCOUNTER — Encounter: Payer: Self-pay | Admitting: Hematology

## 2016-10-22 ENCOUNTER — Telehealth: Payer: Self-pay | Admitting: Hematology

## 2016-10-22 VITALS — BP 174/51 | HR 62 | Temp 97.9°F | Resp 17 | Ht 71.0 in | Wt 202.4 lb

## 2016-10-22 DIAGNOSIS — D61818 Other pancytopenia: Secondary | ICD-10-CM | POA: Diagnosis not present

## 2016-10-22 DIAGNOSIS — R161 Splenomegaly, not elsewhere classified: Secondary | ICD-10-CM

## 2016-10-22 DIAGNOSIS — D696 Thrombocytopenia, unspecified: Secondary | ICD-10-CM

## 2016-10-22 DIAGNOSIS — D709 Neutropenia, unspecified: Secondary | ICD-10-CM | POA: Diagnosis not present

## 2016-10-22 DIAGNOSIS — D72820 Lymphocytosis (symptomatic): Secondary | ICD-10-CM

## 2016-10-22 DIAGNOSIS — D509 Iron deficiency anemia, unspecified: Secondary | ICD-10-CM

## 2016-10-22 DIAGNOSIS — R740 Nonspecific elevation of levels of transaminase and lactic acid dehydrogenase [LDH]: Secondary | ICD-10-CM

## 2016-10-22 LAB — CBC & DIFF AND RETIC
BASO%: 0.4 % (ref 0.0–2.0)
Basophils Absolute: 0 10e3/uL (ref 0.0–0.1)
EOS%: 1.8 % (ref 0.0–7.0)
Eosinophils Absolute: 0.1 10e3/uL (ref 0.0–0.5)
HCT: 36.2 % — ABNORMAL LOW (ref 38.4–49.9)
HGB: 11.9 g/dL — ABNORMAL LOW (ref 13.0–17.1)
Immature Retic Fract: 5.1 % (ref 3.00–10.60)
LYMPH%: 25.9 % (ref 14.0–49.0)
MCH: 26.4 pg — ABNORMAL LOW (ref 27.2–33.4)
MCHC: 32.9 g/dL (ref 32.0–36.0)
MCV: 80.4 fL (ref 79.3–98.0)
MONO#: 0.3 10e3/uL (ref 0.1–0.9)
MONO%: 9.2 % (ref 0.0–14.0)
NEUT#: 1.8 10e3/uL (ref 1.5–6.5)
NEUT%: 62.7 % (ref 39.0–75.0)
Platelets: 110 10e3/uL — ABNORMAL LOW (ref 140–400)
RBC: 4.5 10e6/uL (ref 4.20–5.82)
RDW: 16.1 % — ABNORMAL HIGH (ref 11.0–14.6)
Retic %: 1.5 % (ref 0.80–1.80)
Retic Ct Abs: 67.5 10e3/uL (ref 34.80–93.90)
WBC: 2.8 10e3/uL — ABNORMAL LOW (ref 4.0–10.3)
lymph#: 0.7 10e3/uL — ABNORMAL LOW (ref 0.9–3.3)

## 2016-10-22 LAB — COMPREHENSIVE METABOLIC PANEL
ALT: 12 U/L (ref 0–55)
AST: 15 U/L (ref 5–34)
Albumin: 3.7 g/dL (ref 3.5–5.0)
Alkaline Phosphatase: 104 U/L (ref 40–150)
Anion Gap: 8 mEq/L (ref 3–11)
BILIRUBIN TOTAL: 1.18 mg/dL (ref 0.20–1.20)
BUN: 11.1 mg/dL (ref 7.0–26.0)
CO2: 24 meq/L (ref 22–29)
Calcium: 8.5 mg/dL (ref 8.4–10.4)
Chloride: 111 mEq/L — ABNORMAL HIGH (ref 98–109)
Creatinine: 1.2 mg/dL (ref 0.7–1.3)
EGFR: 54 mL/min/{1.73_m2} — AB (ref >60)
GLUCOSE: 102 mg/dL (ref 70–140)
Potassium: 3.6 mEq/L (ref 3.5–5.1)
SODIUM: 143 meq/L (ref 136–145)
TOTAL PROTEIN: 6.2 g/dL — AB (ref 6.4–8.3)

## 2016-10-22 LAB — LACTATE DEHYDROGENASE: LDH: 185 U/L (ref 125–245)

## 2016-10-22 NOTE — Telephone Encounter (Signed)
Gave avs and calendar for December  °

## 2016-12-01 DIAGNOSIS — M25551 Pain in right hip: Secondary | ICD-10-CM | POA: Diagnosis not present

## 2016-12-01 DIAGNOSIS — M7061 Trochanteric bursitis, right hip: Secondary | ICD-10-CM | POA: Diagnosis not present

## 2016-12-02 DIAGNOSIS — M7061 Trochanteric bursitis, right hip: Secondary | ICD-10-CM | POA: Diagnosis not present

## 2016-12-02 DIAGNOSIS — M25551 Pain in right hip: Secondary | ICD-10-CM | POA: Diagnosis not present

## 2016-12-08 DIAGNOSIS — M7061 Trochanteric bursitis, right hip: Secondary | ICD-10-CM | POA: Diagnosis not present

## 2016-12-08 DIAGNOSIS — M25551 Pain in right hip: Secondary | ICD-10-CM | POA: Diagnosis not present

## 2016-12-10 DIAGNOSIS — M7061 Trochanteric bursitis, right hip: Secondary | ICD-10-CM | POA: Diagnosis not present

## 2016-12-10 DIAGNOSIS — M25551 Pain in right hip: Secondary | ICD-10-CM | POA: Diagnosis not present

## 2016-12-16 DIAGNOSIS — M7061 Trochanteric bursitis, right hip: Secondary | ICD-10-CM | POA: Diagnosis not present

## 2016-12-16 DIAGNOSIS — M25551 Pain in right hip: Secondary | ICD-10-CM | POA: Diagnosis not present

## 2016-12-18 DIAGNOSIS — M25551 Pain in right hip: Secondary | ICD-10-CM | POA: Diagnosis not present

## 2016-12-18 DIAGNOSIS — M7061 Trochanteric bursitis, right hip: Secondary | ICD-10-CM | POA: Diagnosis not present

## 2016-12-23 DIAGNOSIS — M7061 Trochanteric bursitis, right hip: Secondary | ICD-10-CM | POA: Diagnosis not present

## 2016-12-23 DIAGNOSIS — M25551 Pain in right hip: Secondary | ICD-10-CM | POA: Diagnosis not present

## 2016-12-24 ENCOUNTER — Ambulatory Visit (HOSPITAL_BASED_OUTPATIENT_CLINIC_OR_DEPARTMENT_OTHER): Payer: Medicare Other | Admitting: Hematology

## 2016-12-24 ENCOUNTER — Other Ambulatory Visit (HOSPITAL_BASED_OUTPATIENT_CLINIC_OR_DEPARTMENT_OTHER): Payer: Medicare Other

## 2016-12-24 ENCOUNTER — Encounter: Payer: Self-pay | Admitting: Hematology

## 2016-12-24 ENCOUNTER — Telehealth: Payer: Self-pay | Admitting: Hematology

## 2016-12-24 VITALS — BP 178/57 | HR 79 | Temp 98.4°F | Resp 18 | Ht 71.0 in | Wt 196.8 lb

## 2016-12-24 DIAGNOSIS — D61818 Other pancytopenia: Secondary | ICD-10-CM | POA: Diagnosis not present

## 2016-12-24 DIAGNOSIS — D709 Neutropenia, unspecified: Secondary | ICD-10-CM | POA: Diagnosis not present

## 2016-12-24 DIAGNOSIS — R161 Splenomegaly, not elsewhere classified: Secondary | ICD-10-CM | POA: Diagnosis not present

## 2016-12-24 DIAGNOSIS — D696 Thrombocytopenia, unspecified: Secondary | ICD-10-CM | POA: Diagnosis not present

## 2016-12-24 DIAGNOSIS — D649 Anemia, unspecified: Secondary | ICD-10-CM

## 2016-12-24 DIAGNOSIS — D72820 Lymphocytosis (symptomatic): Secondary | ICD-10-CM

## 2016-12-24 LAB — COMPREHENSIVE METABOLIC PANEL
ALBUMIN: 3.9 g/dL (ref 3.5–5.0)
ALT: 14 U/L (ref 0–55)
ANION GAP: 10 meq/L (ref 3–11)
AST: 17 U/L (ref 5–34)
Alkaline Phosphatase: 107 U/L (ref 40–150)
BILIRUBIN TOTAL: 1.64 mg/dL — AB (ref 0.20–1.20)
BUN: 10.9 mg/dL (ref 7.0–26.0)
CALCIUM: 8.8 mg/dL (ref 8.4–10.4)
CO2: 26 mEq/L (ref 22–29)
CREATININE: 1.1 mg/dL (ref 0.7–1.3)
Chloride: 106 mEq/L (ref 98–109)
EGFR: >60 mL/min/{1.73_m2} (ref >60)
Glucose: 109 mg/dl (ref 70–140)
Potassium: 3.9 mEq/L (ref 3.5–5.1)
Sodium: 141 mEq/L (ref 136–145)
TOTAL PROTEIN: 6.3 g/dL — AB (ref 6.4–8.3)

## 2016-12-24 LAB — CBC & DIFF AND RETIC
BASO%: 0 % (ref 0.0–2.0)
BASOS ABS: 0 10*3/uL (ref 0.0–0.1)
EOS%: 0.5 % (ref 0.0–7.0)
Eosinophils Absolute: 0 10*3/uL (ref 0.0–0.5)
HEMATOCRIT: 36.2 % — AB (ref 38.4–49.9)
HGB: 12 g/dL — ABNORMAL LOW (ref 13.0–17.1)
Immature Retic Fract: 3 % (ref 3.00–10.60)
LYMPH%: 17.4 % (ref 14.0–49.0)
MCH: 26.4 pg — AB (ref 27.2–33.4)
MCHC: 33.1 g/dL (ref 32.0–36.0)
MCV: 79.7 fL (ref 79.3–98.0)
MONO#: 0.4 10*3/uL (ref 0.1–0.9)
MONO%: 9.4 % (ref 0.0–14.0)
NEUT%: 72.7 % (ref 39.0–75.0)
NEUTROS ABS: 2.8 10*3/uL (ref 1.5–6.5)
Platelets: 123 10*3/uL — ABNORMAL LOW (ref 140–400)
RBC: 4.54 10*6/uL (ref 4.20–5.82)
RDW: 16.2 % — AB (ref 11.0–14.6)
Retic %: 1.25 % (ref 0.80–1.80)
Retic Ct Abs: 56.75 10*3/uL (ref 34.80–93.90)
WBC: 3.8 10*3/uL — AB (ref 4.0–10.3)
lymph#: 0.7 10*3/uL — ABNORMAL LOW (ref 0.9–3.3)

## 2016-12-24 NOTE — Telephone Encounter (Signed)
Scheduled appts per 12/12 los - patient did not want avs or calendar.

## 2016-12-24 NOTE — Progress Notes (Signed)
[name]    HEMATOLOGY/ONCOLOGY CLINIC NOTE  Date of Service: 12/24/2016  Patient Care Team: Deland Pretty, MD as PCP - General (Internal Medicine)  CHIEF COMPLAINTS/PURPOSE OF CONSULTATION:  Anemia and leucopenia  HISTORY OF PRESENTING ILLNESS:   Brandon Fischer is a wonderful 83 y.o. male  who has been referred to [place] by Dr .Deland Pretty, MD for evaluation and management of anemia and leukopenia.  Patient has a history of hypertension, dyslipidemia, atrial fibrillation, coronary artery disease, bilateral carotid artery stenosis, paroxysmal supraventricular tachycardias status post radiofrequency ablation, GERD who on recent labs with his primary care physician on 09/01/2016 which showed new anemia with a hemoglobin of 9.3 with an MCV of 82.9 and RDW of 18.5. WBC count of 2.1k with an ANC of about 700 and mild thrombocytopenia of 119k.  CMP showed bilirubin of 1.7 with an alkaline phosphatase of 154 normal AST ALT creatinine 1.9 with BUN of 24.  Patient subsequently had an ultrasound of the abdomen done on 09/09/2016 for evaluation of his abnormal liver function tests and renal insufficiency and was noted to have severe splenomegaly with this plane off 18 cm in size with a volume of 1407 mL. No focal hepatic abnormalities. No bladder distention.. 3.7 cm abdominal aortic aneurysm.  Repeat labs done in clinic today show a slight improvement in WBC count of 3.6k with an ANC of 1000. Hemoglobin 9.4 and platelets of 108k . Patient has a obvious deep palpable spleen on clinical examination. Overtly palpable lymphadenopathy noted.  Patient notes no overt weight loss fevers chills or night sweats.  No change in mental status. No chest pain no shortness of breath no significant abdominal discomfort headaches. No evidence of bleeding. No issues with recent infections. Some fatigue.  INTERVAL HISTORY  Pt presents to the office today for f/u of his pancytopenia accompanied by his wife. His WBC at  3.8K as of today (12/24/16), improved from 2.8K 2 months ago. Hemoglobin at 12 as of today (12/24/16), improved from 11.9 2 months ago. Platelets at 123K improved from 110K 2 months ago. He reports that he is doing well overall. He reports that he has an upcoming follow up appointment with Dr. Shelia Media.   On review of systems, he reports right hip bursitis which wakes him from his sleep twice a night. He takes tylenol to aid in alleviating this pain and he is currently in Physical Therapy that is slowly helping his symptoms. He has had a prior cortisone injection with relief initially until his pain resumed. He denies abdominal pain, leg swelling, fever, chills, weight loss, decreased appetite, decreased PO intake, or any other symptoms.       MEDICAL HISTORY:  Past Medical History:  Diagnosis Date  . At risk for sleep apnea    STOP-BANG= 4    SENT TO PCP 02-06-2014  . Bilateral carotid artery stenosis without cerebral infarction    ASYMPTOMATIC--  BILATERAL ICA  50-69%  PER CARDIOLOGIST NOTE, DR Einar Gip  . Bilateral carotid bruits    LEFT > RIGHT  . Coronary artery disease cardiologist-- dr Stevphen [place] CAD  per cath 08-06-2004 HX PALPITATIONS-AND -TACHYCARDIA-  . First degree heart block   . Full dentures   . GERD (gastroesophageal reflux disease)   . Heart palpitations    Not any more  . History of atrial fibrillation without current medication    EPISODE OF AFIB WITH RVR INTRAOPERATIVELY 02-09-2014 AT [facility]--  RESOLVED AND PT FOLLOWED UP WITH CARDIOLOGIST  DR Einar Gip  .  History of kidney stones   . Hyperlipidemia   . Hypertension   . Nephrolithiasis    RIGHT  . Organic impotence   . Psoriasis    Clearing up  . PSVT (paroxysmal supraventricular tachycardia) ([place])   . Right ureteral calculus   . S/P radiofrequency ablation operation for arrhythmia-SVT 04/28/2014  . Simple renal cyst    right  . Wears glasses   . Wears hearing aid    BILATERAL  History of malignant  melanoma status post Mohs surgery to remove the skin lesion on the face with skin grafting. 3 years ago.  SURGICAL HISTORY: Past Surgical History:  Procedure Laterality Date  . CARDIAC CATHETERIZATION  08-06-2004 dr Verlon Setting   (abnormal stress test) Non-obstructive CAD, pLAD 20-30%,  LCX 30-40%, pRCA 20-30%, dRCA 40%, distal LM  mild diffuse calcifation 20-30% tapering stenosis,  preserved LVSF ef 55-60%  . CARPAL TUNNEL RELEASE Bilateral 2005  . CATARACT EXTRACTION, BILATERAL Bilateral 2011  . CHOLECYSTECTOMY OPEN  1982   and NEPHROLITHOTOMY  . COLONOSCOPY W/ POLYPECTOMY  last one 04-05-2008  . CYSTOSCOPY WITH RETROGRADE PYELOGRAM, URETEROSCOPY AND STENT PLACEMENT Right 03/14/2014   Procedure: CYSTO/RIGHT RETROGRADE PYELOGRAM/RIGHT URETEROSCOPY/STONE EXTRACTION/STENT PLACEMENT;  Surgeon: Malka So, MD;  [facility];  Service: Urology;  Laterality: Right;  . HOLMIUM LASER APPLICATION Right 07/18/6431   Procedure: RIGHT HOLMIUM LASER APPLICATION;  Surgeon: Malka So, MD;  Location: [facility];  Service: Urology;  Laterality: Right;  . KNEE ARTHROSCOPY W/ DEBRIDEMENT Right 11-23-2002   and Synovectomy  . NEPHROLITHOTOMY  01/16/2011   Procedure: NEPHROLITHOTOMY PERCUTANEOUS;  Surgeon: Malka So;  Location: [facility];  Service: Urology;  Laterality: Left;  C-Arm  Holmium Laser  . NEPHROLITHOTOMY  1981  . SUPRAVENTRICULAR TACHYCARDIA ABLATION N/A 04/28/2014   Procedure: SUPRAVENTRICULAR TACHYCARDIA ABLATION;  Surgeon: Evans Lance, MD;  Location: [facility];  Service: Cardiovascular;  Laterality: N/A;    SOCIAL HISTORY: Social History   Socioeconomic History  . Marital status: Married    Spouse name: Not on file  . Number of children: 2  . Years of education: Not on file  . Highest education level: Not on file  Social Needs  . Financial resource strain: Not on file  . Food insecurity - worry: Not on file  . Food insecurity - inability: Not on  file  . Transportation needs - medical: Not on file  . Transportation needs - non-medical: Not on file  Occupational History  . Occupation: Actor     Comment: retired  Tobacco Use  . Smoking status: Former Smoker    Packs/day: 1.00    Years: 40.00    Pack years: 40.00    Types: Cigarettes    Last attempt to quit: 02/06/1998    Years since quitting: 18.8  . Smokeless tobacco: Never Used  Substance and Sexual Activity  . Alcohol use: No  . Drug use: No  . Sexual activity: Not on file  Other Topics Concern  . Not on file  Social History Narrative  . Not on file    FAMILY HISTORY: Family History  Problem Relation Age of Onset  . Cancer Mother        Ovarian or colon  . Heart attack Father   . Heart attack Brother     ALLERGIES:  has No Known Allergies.  MEDICATIONS:  Current Outpatient Medications  Medication Sig Dispense Refill  . aspirin 81 MG tablet Take 81 mg by  mouth every other day.     [name] atorvastatin (LIPITOR) 20 MG tablet Take 20 mg by mouth daily.    . diphenhydramine-acetaminophen (TYLENOL PM) 25-500 MG TABS Take 2 tablets by mouth at bedtime.    [name] losartan (COZAAR) 50 MG tablet Take 50 mg by mouth daily.    . pantoprazole (PROTONIX) 40 MG tablet Take 40 mg by mouth every morning.     . traMADol (ULTRAM) 50 MG tablet Take 50 mg by mouth every 6 (six) hours as needed for moderate pain.      No current facility-administered medications for this visit.     REVIEW OF SYSTEMS:    10 Point review of Systems was done is negative except as noted above.  PHYSICAL EXAMINATION:  ECOG PERFORMANCE STATUS: 2 - Symptomatic, <50% confined to bed  . Vitals:   12/24/16 1149  BP: (!) 178/57  Pulse: 79  Resp: 18  Temp: 98.4 F (36.9 C)  SpO2: 100%   Filed Weights   12/24/16 1149  Weight: 196 lb 12.8 oz (89.3 kg)   .Body mass index is 27.45 kg/m.  GENERAL:alert, in no acute distress and comfortable SKIN: no acute rashes, no significant lesions EYES:  conjunctiva are pink and non-injected, sclera anicteric OROPHARYNX: MMM, no exudates, no oropharyngeal erythema or ulceration NECK: supple, no JVD LYMPH:  no palpable lymphadenopathy in the cervical, axillary or inguinal regions LUNGS: clear to auscultation b/l with normal respiratory effort HEART: regular rate & rhythm ABDOMEN:  normoactive bowel sounds , palpable splenomegaly Extremity: no pedal edema  PSYCH: alert & oriented x 3 with fluent speech NEURO: no focal motor/sensory deficits  LABORATORY DATA:  I have reviewed the data as listed  . CBC Latest Ref Rng & Units 12/24/2016 10/22/2016 09/30/2016  WBC 4.0 - 10.3 10e3/uL 3.8(L) 2.8(L) 2.4(L)  Hemoglobin 13.0 - 17.1 g/dL 12.0(L) 11.9(L) 10.7(L)  Hematocrit 38.4 - 49.9 % 36.2(L) 36.2(L) 33.0(L)  Platelets 140 - 400 10e3/uL 123(L) 110(L) 103(L)   ANC 1800  CBC    Component Value Date/Time   WBC 3.8 (L) 12/24/2016 1113   WBC 1.7 (L) 09/18/2016 0902   RBC 4.54 12/24/2016 1113   RBC 3.39 (L) 09/18/2016 0902   HGB 12.0 (L) 12/24/2016 1113   HCT 36.2 (L) 12/24/2016 1113   PLT 123 (L) 12/24/2016 1113   MCV 79.7 12/24/2016 1113   MCH 26.4 (L) 12/24/2016 1113   MCH 26.8 09/18/2016 0902   MCHC 33.1 12/24/2016 1113   MCHC 32.9 09/18/2016 0902   RDW 16.2 (H) 12/24/2016 1113   LYMPHSABS 0.7 (L) 12/24/2016 1113   MONOABS 0.4 12/24/2016 1113   EOSABS 0.0 12/24/2016 1113   BASOSABS 0.0 12/24/2016 1113     . CMP Latest Ref Rng & Units 12/24/2016 10/22/2016 09/17/2016  Glucose 70 - 140 mg/dl 109 102 90  BUN 7.0 - 26.0 mg/dL 10.9 11.1 17.0  Creatinine 0.7 - 1.3 mg/dL 1.1 1.2 1.6(H)  Sodium 136 - 145 mEq/L 141 143 141  Potassium 3.5 - 5.1 mEq/L 3.9 3.6 4.0  Chloride 96 - 112 mEq/L - - -  CO2 22 - 29 mEq/L 26 24 22   Calcium 8.4 - 10.4 mg/dL 8.8 8.5 8.5  Total Protein 6.4 - 8.3 g/dL 6.3(L) 6.2(L) 6.0(L)  Total Bilirubin 0.20 - 1.20 mg/dL 1.64(H) 1.18 1.71(H)  Alkaline Phos 40 - 150 U/L 107 104 142  AST 5 - 34 U/L 17 15 24   ALT  0 - 55 U/L 14 12 21    .  Lab Results  Component Value Date   LDH 185 10/22/2016     Component     Latest Ref Rng & Units 09/09/2016  IgG (Immunoglobin G), Serum     700 - 1,600 mg/dL 917  IgA/Immunoglobulin A, Serum     61 - 437 mg/dL 203  IgM, Qn, Serum     15 - 143 mg/dL 43  Total Protein     6.0 - 8.5 g/dL 6.1  Albumin SerPl Elph-Mcnc     2.9 - 4.4 g/dL 3.7  Alpha 1     0.0 - 0.4 g/dL 0.3  Alpha2 Glob SerPl Elph-Mcnc     0.4 - 1.0 g/dL 0.5  B-Globulin SerPl Elph-Mcnc     0.7 - 1.3 g/dL 0.8  Gamma Glob SerPl Elph-Mcnc     0.4 - 1.8 g/dL 0.7  M Protein SerPl Elph-Mcnc     Not Observed g/dL Not Observed  Globulin, Total     2.2 - 3.9 g/dL 2.4  Albumin/Glob SerPl     0.7 - 1.7 1.6  IFE 1      Comment  Please Note (HCV):      Comment  Iron     42 - 163 ug/dL 64  TIBC     202 - 409 ug/dL 194 (L)  UIBC     117 - 376 ug/dL 130  %SAT     20 - 55 % 33  Folate, Hemolysate     Not Estab. ng/mL 369.8  HCT     37.5 - 51.0 % 28.0 (L)  Folate, RBC     >498 ng/mL 1,321  Ig Kappa Free Light Chain     3.3 - 19.4 mg/L 21.4 (H)  Ig Lambda Free Light Chain     5.7 - 26.3 mg/L 36.6 (H)  Kappa/Lambda FluidC Ratio     0.26 - 1.65 0.58  LDH     125 - 245 U/L 326 (H)  Sed Rate     0 - 30 mm/hr 2  Vitamin B12     232 - 1245 pg/mL 1,261 (H)  TSH     0.320 - 4.118 m(IU)/L 2.609  Hep C Virus Ab     0.0 - 0.9 s/co ratio 0.1  Ferritin     22 - 316 ng/ml 399 (H)   Component     Latest Ref Rng & Units 09/17/2016 09/18/2016  Prothrombin Time     11.4 - 15.2 seconds  14.2  INR       1.11  LDH     125 - 245 U/L 309 (H)   Haptoglobin     34 - 200 mg/dL <10 (L)   Coombs', Direct     Negative Negative   APTT     24 - 36 seconds  32   Component     Latest Ref Rng & Units 09/30/2016  RA Latex Turbid.     0.0 - 13.9 IU/mL <10.0  CCP Antibodies IgG/IgA     0 - 19 units 12  ANA Ab, IFA      Negative  Sed Rate     0 - 30 mm/hr 2   Component     Latest Ref Rng & Units  09/30/2016 09/30/2016 09/30/2016 10/22/2016         3:23 PM  3:23 PM  3:23 PM   Interpretation       Comment    Comment:       Comment Comment   Specimen:  Peripheral Blood    Submitted Dx:       Comment    Viability:       97%    Cell Population       Comment    Granulocytes:       Comment    Monocytes:       Comment    Antibodies Performed:       Comment    Director Review       Comment    RA Latex Turbid.     0.0 - 13.9 IU/mL <10.0     CCP Antibodies IgG/IgA     0 - 19 units 12     ANA Ab, IFA      Negative     LDH     125 - 245 U/L    185           RADIOGRAPHIC STUDIES: I have personally reviewed the radiological images as listed and agreed with the findings in the report. No results found.  ASSESSMENT & PLAN:   #1 Pancytopenia with normocytic Anemia, neutropenia and thrombocytopenia This could certainly be from the patient's splenomegaly causing hypersplenism. Etiology of the splenomegaly is unclear.  Bone marrow showed no overt evidence of MDS/Myelofibrosis no evidence of acute leukemia Increased CD8+ T cell population ? Reactive vs clonal. Normal cytogenetics. Sed rate WNL neg ANA and Rheumatoid panel  No evidence of monoclonal paraproteinemia. TSH levels within normal limits B12, folate and iron levels within normal limits. Hepatitis C negative  Given improvement in counts - the T cell population likely reactive and could be related to a viral infection that appears to be resolving now. -WBC at 3.8K as of today (12/24/16), improved from 2.8K 2 months ago. Hemoglobin at 12 as of today (12/24/16), improved from 11.9 2 months ago. Platelets at 123K improved from 110K 2 months ago.   #2 Elevated LDH With mild elevated bilirubin cannot r/o some hemolysis though that would not explain leucopenia/neutropenia or thrombocytopenia and splenomegaly by itself LDH slightly lower. Haptoglobin <10 --- suggestive of some hemolysis. Coombs neg.  LDH levels  have now normalized  PLAN -Counts have significantly improved suggesting that this could be a resolving viral infection related bone marrow suppression -No interventions/treatment recommended at this time. -US Abdomen limited in 5 months to evaluate spleen -Plan to repeat labs in 6 months with follow up visit -If the blood counts continue to look stable and improve, then following next visit in 6 months, I will discharge the patient to follow up with his PCP, Dr. Shelia Media for further management and care.     [place] abd limited in 5 months RTC with Dr Irene Limbo in 6 months with labs  Orders Placed This Encounter  Procedures  . US Abdomen Limited    Standing Status:   Future    Standing Expiration Date:   12/24/2017    Order Specific Question:   Reason for Exam (SYMPTOM  OR DIAGNOSIS REQUIRED)    Answer:   to re-evaluate for resolution of splenomegaly    Order Specific Question:   Preferred imaging location?    Answer:   [facility]  . CBC & Diff and Retic    Standing Status:   Future    Standing Expiration Date:   12/24/2017  . Comprehensive metabolic panel    Standing Status:   Future    Standing Expiration Date:   12/24/2017  . Lactate dehydrogenase    Standing Status:   Future  Standing Expiration Date:   12/24/2017    All of the patients questions were answered with apparent satisfaction. The patient knows to call the clinic with any problems, questions or concerns.  I spent 15 minutes counseling the patient face to face. The total time spent in the appointment was 15 minutes and more than 50% was on counseling and direct patient cares.   This document serves as a record of services personally performed by Sullivan Lone, MD. It was created on his behalf by Steva Colder, a trained medical scribe. The creation of this record is based on the scribe's personal observations and the provider's statements to them.   .I have reviewed the above documentation for accuracy and  completeness, and I agree with the above.   Sullivan Lone MD [place] AAHIVMS [facility] Hematology/Oncology Physician [facility]  (Office):       [phone] (Work cell):  [phone] (Fax):           [phone]

## 2016-12-24 NOTE — Patient Instructions (Signed)
Thank you for choosing [facility] to provide your oncology and hematology care.  To afford each patient quality time with our providers, please arrive 30 minutes before your scheduled appointment time.  If you arrive late for your appointment, you may be asked to reschedule.  We strive to give you quality time with our providers, and arriving late affects you and other patients whose appointments are after yours.   If you are a no show for multiple scheduled visits, you may be dismissed from the clinic at the providers discretion.    Again, thank you for choosing [facility], our hope is that these requests will decrease the amount of time that you wait before being seen by our physicians.  ______________________________________________________________________  Should you have questions after your visit to the  Cancer Center, please contact our office at [phone] between the hours of 8:30 and 4:30 p.m.    Voicemails left after 4:30p.m will not be returned until the following business day.    For prescription refill requests, please have your pharmacy contact us directly.  Please also try to allow 48 hours for prescription requests.    Please contact the scheduling department for questions regarding scheduling.  For scheduling of procedures such as PET scans, CT scans, MRI, Ultrasound, etc please contact central scheduling at [phone].    Resources For Cancer Patients and Caregivers:   Oncolink.org:  A wonderful resource for patients and healthcare providers for information regarding your disease, ways to tract your treatment, what to expect, etc.     American Cancer Society:  800-227-2345  Can help patients locate various types of support and financial assistance  Cancer Care: 1-800-813-HOPE (4673) Provides financial assistance, online support groups, medication/co-pay assistance.    [place] DSS:  336-641-3447 Where to apply for food  stamps, Medicaid, and utility assistance  Medicare Rights Center: 800-333-4114 Helps people with Medicare understand their rights and benefits, navigate the Medicare system, and secure the quality healthcare they deserve  SCAT: 336-333-6589 [place] [employer]'s shared-ride transportation service for eligible riders who have a disability that prevents them from riding the fixed route bus.    For additional information on assistance programs please contact our social worker:   Grier Hock/Abigail Elmore:  [phone]            

## 2016-12-26 DIAGNOSIS — M7061 Trochanteric bursitis, right hip: Secondary | ICD-10-CM | POA: Diagnosis not present

## 2016-12-26 DIAGNOSIS — M25551 Pain in right hip: Secondary | ICD-10-CM | POA: Diagnosis not present

## 2016-12-31 DIAGNOSIS — M7061 Trochanteric bursitis, right hip: Secondary | ICD-10-CM | POA: Diagnosis not present

## 2016-12-31 DIAGNOSIS — M25551 Pain in right hip: Secondary | ICD-10-CM | POA: Diagnosis not present

## 2017-01-02 DIAGNOSIS — M25551 Pain in right hip: Secondary | ICD-10-CM | POA: Diagnosis not present

## 2017-01-02 DIAGNOSIS — M7061 Trochanteric bursitis, right hip: Secondary | ICD-10-CM | POA: Diagnosis not present

## 2017-01-07 DIAGNOSIS — M25551 Pain in right hip: Secondary | ICD-10-CM | POA: Diagnosis not present

## 2017-01-07 DIAGNOSIS — M7061 Trochanteric bursitis, right hip: Secondary | ICD-10-CM | POA: Diagnosis not present

## 2017-01-15 DIAGNOSIS — M7061 Trochanteric bursitis, right hip: Secondary | ICD-10-CM | POA: Diagnosis not present

## 2017-01-15 DIAGNOSIS — M25551 Pain in right hip: Secondary | ICD-10-CM | POA: Diagnosis not present

## 2017-01-16 DIAGNOSIS — M7061 Trochanteric bursitis, right hip: Secondary | ICD-10-CM | POA: Diagnosis not present

## 2017-01-16 DIAGNOSIS — M25551 Pain in right hip: Secondary | ICD-10-CM | POA: Diagnosis not present

## 2017-01-20 DIAGNOSIS — M7061 Trochanteric bursitis, right hip: Secondary | ICD-10-CM | POA: Diagnosis not present

## 2017-01-20 DIAGNOSIS — M25551 Pain in right hip: Secondary | ICD-10-CM | POA: Diagnosis not present

## 2017-01-23 DIAGNOSIS — M7061 Trochanteric bursitis, right hip: Secondary | ICD-10-CM | POA: Diagnosis not present

## 2017-01-23 DIAGNOSIS — M25551 Pain in right hip: Secondary | ICD-10-CM | POA: Diagnosis not present

## 2017-01-30 DIAGNOSIS — M25551 Pain in right hip: Secondary | ICD-10-CM | POA: Diagnosis not present

## 2017-01-30 DIAGNOSIS — M7061 Trochanteric bursitis, right hip: Secondary | ICD-10-CM | POA: Diagnosis not present

## 2017-02-11 DIAGNOSIS — M25551 Pain in right hip: Secondary | ICD-10-CM | POA: Diagnosis not present

## 2017-02-11 DIAGNOSIS — M7061 Trochanteric bursitis, right hip: Secondary | ICD-10-CM | POA: Diagnosis not present

## 2017-02-24 DIAGNOSIS — M5137 Other intervertebral disc degeneration, lumbosacral region: Secondary | ICD-10-CM | POA: Diagnosis not present

## 2017-02-24 DIAGNOSIS — M4727 Other spondylosis with radiculopathy, lumbosacral region: Secondary | ICD-10-CM | POA: Diagnosis not present

## 2017-02-24 DIAGNOSIS — M9903 Segmental and somatic dysfunction of lumbar region: Secondary | ICD-10-CM | POA: Diagnosis not present

## 2017-02-24 DIAGNOSIS — M9904 Segmental and somatic dysfunction of sacral region: Secondary | ICD-10-CM | POA: Diagnosis not present

## 2017-02-25 DIAGNOSIS — M4727 Other spondylosis with radiculopathy, lumbosacral region: Secondary | ICD-10-CM | POA: Diagnosis not present

## 2017-02-25 DIAGNOSIS — M9903 Segmental and somatic dysfunction of lumbar region: Secondary | ICD-10-CM | POA: Diagnosis not present

## 2017-02-25 DIAGNOSIS — M5137 Other intervertebral disc degeneration, lumbosacral region: Secondary | ICD-10-CM | POA: Diagnosis not present

## 2017-02-25 DIAGNOSIS — M9904 Segmental and somatic dysfunction of sacral region: Secondary | ICD-10-CM | POA: Diagnosis not present

## 2017-02-26 DIAGNOSIS — M5137 Other intervertebral disc degeneration, lumbosacral region: Secondary | ICD-10-CM | POA: Diagnosis not present

## 2017-02-26 DIAGNOSIS — M9904 Segmental and somatic dysfunction of sacral region: Secondary | ICD-10-CM | POA: Diagnosis not present

## 2017-02-26 DIAGNOSIS — M9903 Segmental and somatic dysfunction of lumbar region: Secondary | ICD-10-CM | POA: Diagnosis not present

## 2017-02-26 DIAGNOSIS — M4727 Other spondylosis with radiculopathy, lumbosacral region: Secondary | ICD-10-CM | POA: Diagnosis not present

## 2017-03-02 DIAGNOSIS — M5137 Other intervertebral disc degeneration, lumbosacral region: Secondary | ICD-10-CM | POA: Diagnosis not present

## 2017-03-02 DIAGNOSIS — M9904 Segmental and somatic dysfunction of sacral region: Secondary | ICD-10-CM | POA: Diagnosis not present

## 2017-03-02 DIAGNOSIS — M4727 Other spondylosis with radiculopathy, lumbosacral region: Secondary | ICD-10-CM | POA: Diagnosis not present

## 2017-03-02 DIAGNOSIS — M9903 Segmental and somatic dysfunction of lumbar region: Secondary | ICD-10-CM | POA: Diagnosis not present

## 2017-03-03 DIAGNOSIS — M5137 Other intervertebral disc degeneration, lumbosacral region: Secondary | ICD-10-CM | POA: Diagnosis not present

## 2017-03-03 DIAGNOSIS — M9903 Segmental and somatic dysfunction of lumbar region: Secondary | ICD-10-CM | POA: Diagnosis not present

## 2017-03-03 DIAGNOSIS — M9904 Segmental and somatic dysfunction of sacral region: Secondary | ICD-10-CM | POA: Diagnosis not present

## 2017-03-03 DIAGNOSIS — M4727 Other spondylosis with radiculopathy, lumbosacral region: Secondary | ICD-10-CM | POA: Diagnosis not present

## 2017-03-04 DIAGNOSIS — M5137 Other intervertebral disc degeneration, lumbosacral region: Secondary | ICD-10-CM | POA: Diagnosis not present

## 2017-03-04 DIAGNOSIS — M9904 Segmental and somatic dysfunction of sacral region: Secondary | ICD-10-CM | POA: Diagnosis not present

## 2017-03-04 DIAGNOSIS — M9903 Segmental and somatic dysfunction of lumbar region: Secondary | ICD-10-CM | POA: Diagnosis not present

## 2017-03-04 DIAGNOSIS — M4727 Other spondylosis with radiculopathy, lumbosacral region: Secondary | ICD-10-CM | POA: Diagnosis not present

## 2017-03-05 DIAGNOSIS — M9904 Segmental and somatic dysfunction of sacral region: Secondary | ICD-10-CM | POA: Diagnosis not present

## 2017-03-05 DIAGNOSIS — M5137 Other intervertebral disc degeneration, lumbosacral region: Secondary | ICD-10-CM | POA: Diagnosis not present

## 2017-03-05 DIAGNOSIS — M9903 Segmental and somatic dysfunction of lumbar region: Secondary | ICD-10-CM | POA: Diagnosis not present

## 2017-03-05 DIAGNOSIS — M4727 Other spondylosis with radiculopathy, lumbosacral region: Secondary | ICD-10-CM | POA: Diagnosis not present

## 2017-03-09 DIAGNOSIS — M5137 Other intervertebral disc degeneration, lumbosacral region: Secondary | ICD-10-CM | POA: Diagnosis not present

## 2017-03-09 DIAGNOSIS — M9904 Segmental and somatic dysfunction of sacral region: Secondary | ICD-10-CM | POA: Diagnosis not present

## 2017-03-09 DIAGNOSIS — M9903 Segmental and somatic dysfunction of lumbar region: Secondary | ICD-10-CM | POA: Diagnosis not present

## 2017-03-09 DIAGNOSIS — M4727 Other spondylosis with radiculopathy, lumbosacral region: Secondary | ICD-10-CM | POA: Diagnosis not present

## 2017-03-10 DIAGNOSIS — M9903 Segmental and somatic dysfunction of lumbar region: Secondary | ICD-10-CM | POA: Diagnosis not present

## 2017-03-10 DIAGNOSIS — M5137 Other intervertebral disc degeneration, lumbosacral region: Secondary | ICD-10-CM | POA: Diagnosis not present

## 2017-03-10 DIAGNOSIS — M9904 Segmental and somatic dysfunction of sacral region: Secondary | ICD-10-CM | POA: Diagnosis not present

## 2017-03-10 DIAGNOSIS — M4727 Other spondylosis with radiculopathy, lumbosacral region: Secondary | ICD-10-CM | POA: Diagnosis not present

## 2017-03-12 DIAGNOSIS — M5137 Other intervertebral disc degeneration, lumbosacral region: Secondary | ICD-10-CM | POA: Diagnosis not present

## 2017-03-12 DIAGNOSIS — M9903 Segmental and somatic dysfunction of lumbar region: Secondary | ICD-10-CM | POA: Diagnosis not present

## 2017-03-12 DIAGNOSIS — M9904 Segmental and somatic dysfunction of sacral region: Secondary | ICD-10-CM | POA: Diagnosis not present

## 2017-03-12 DIAGNOSIS — M4727 Other spondylosis with radiculopathy, lumbosacral region: Secondary | ICD-10-CM | POA: Diagnosis not present

## 2017-03-16 DIAGNOSIS — M9903 Segmental and somatic dysfunction of lumbar region: Secondary | ICD-10-CM | POA: Diagnosis not present

## 2017-03-16 DIAGNOSIS — M9904 Segmental and somatic dysfunction of sacral region: Secondary | ICD-10-CM | POA: Diagnosis not present

## 2017-03-16 DIAGNOSIS — M4727 Other spondylosis with radiculopathy, lumbosacral region: Secondary | ICD-10-CM | POA: Diagnosis not present

## 2017-03-16 DIAGNOSIS — M5137 Other intervertebral disc degeneration, lumbosacral region: Secondary | ICD-10-CM | POA: Diagnosis not present

## 2017-03-18 DIAGNOSIS — M9903 Segmental and somatic dysfunction of lumbar region: Secondary | ICD-10-CM | POA: Diagnosis not present

## 2017-03-18 DIAGNOSIS — M4727 Other spondylosis with radiculopathy, lumbosacral region: Secondary | ICD-10-CM | POA: Diagnosis not present

## 2017-03-18 DIAGNOSIS — M9904 Segmental and somatic dysfunction of sacral region: Secondary | ICD-10-CM | POA: Diagnosis not present

## 2017-03-18 DIAGNOSIS — M5137 Other intervertebral disc degeneration, lumbosacral region: Secondary | ICD-10-CM | POA: Diagnosis not present

## 2017-03-23 DIAGNOSIS — M4727 Other spondylosis with radiculopathy, lumbosacral region: Secondary | ICD-10-CM | POA: Diagnosis not present

## 2017-03-23 DIAGNOSIS — M9904 Segmental and somatic dysfunction of sacral region: Secondary | ICD-10-CM | POA: Diagnosis not present

## 2017-03-23 DIAGNOSIS — M9903 Segmental and somatic dysfunction of lumbar region: Secondary | ICD-10-CM | POA: Diagnosis not present

## 2017-03-23 DIAGNOSIS — M5137 Other intervertebral disc degeneration, lumbosacral region: Secondary | ICD-10-CM | POA: Diagnosis not present

## 2017-03-24 DIAGNOSIS — M9903 Segmental and somatic dysfunction of lumbar region: Secondary | ICD-10-CM | POA: Diagnosis not present

## 2017-03-24 DIAGNOSIS — M5137 Other intervertebral disc degeneration, lumbosacral region: Secondary | ICD-10-CM | POA: Diagnosis not present

## 2017-03-24 DIAGNOSIS — M4727 Other spondylosis with radiculopathy, lumbosacral region: Secondary | ICD-10-CM | POA: Diagnosis not present

## 2017-03-24 DIAGNOSIS — M9904 Segmental and somatic dysfunction of sacral region: Secondary | ICD-10-CM | POA: Diagnosis not present

## 2017-03-25 DIAGNOSIS — M9903 Segmental and somatic dysfunction of lumbar region: Secondary | ICD-10-CM | POA: Diagnosis not present

## 2017-03-25 DIAGNOSIS — M4727 Other spondylosis with radiculopathy, lumbosacral region: Secondary | ICD-10-CM | POA: Diagnosis not present

## 2017-03-25 DIAGNOSIS — M5137 Other intervertebral disc degeneration, lumbosacral region: Secondary | ICD-10-CM | POA: Diagnosis not present

## 2017-03-25 DIAGNOSIS — M9904 Segmental and somatic dysfunction of sacral region: Secondary | ICD-10-CM | POA: Diagnosis not present

## 2017-03-26 DIAGNOSIS — M4727 Other spondylosis with radiculopathy, lumbosacral region: Secondary | ICD-10-CM | POA: Diagnosis not present

## 2017-03-26 DIAGNOSIS — M9904 Segmental and somatic dysfunction of sacral region: Secondary | ICD-10-CM | POA: Diagnosis not present

## 2017-03-26 DIAGNOSIS — M5137 Other intervertebral disc degeneration, lumbosacral region: Secondary | ICD-10-CM | POA: Diagnosis not present

## 2017-03-26 DIAGNOSIS — M9903 Segmental and somatic dysfunction of lumbar region: Secondary | ICD-10-CM | POA: Diagnosis not present

## 2017-03-30 DIAGNOSIS — M5137 Other intervertebral disc degeneration, lumbosacral region: Secondary | ICD-10-CM | POA: Diagnosis not present

## 2017-03-30 DIAGNOSIS — M9904 Segmental and somatic dysfunction of sacral region: Secondary | ICD-10-CM | POA: Diagnosis not present

## 2017-03-30 DIAGNOSIS — M9903 Segmental and somatic dysfunction of lumbar region: Secondary | ICD-10-CM | POA: Diagnosis not present

## 2017-03-30 DIAGNOSIS — M4727 Other spondylosis with radiculopathy, lumbosacral region: Secondary | ICD-10-CM | POA: Diagnosis not present

## 2017-03-31 ENCOUNTER — Other Ambulatory Visit: Payer: Self-pay

## 2017-03-31 ENCOUNTER — Emergency Department (HOSPITAL_COMMUNITY)
Admission: EM | Admit: 2017-03-31 | Discharge: 2017-04-01 | Disposition: A | Discharge status: Home / Self Care | Payer: Medicare Other | Attending: Emergency Medicine | Admitting: Emergency Medicine

## 2017-03-31 ENCOUNTER — Emergency Department (HOSPITAL_COMMUNITY): Payer: Medicare Other

## 2017-03-31 DIAGNOSIS — Z7982 Long term (current) use of aspirin: Secondary | ICD-10-CM | POA: Diagnosis not present

## 2017-03-31 DIAGNOSIS — R1031 Right lower quadrant pain: Secondary | ICD-10-CM

## 2017-03-31 DIAGNOSIS — I2510 Atherosclerotic heart disease of native coronary artery without angina pectoris: Secondary | ICD-10-CM | POA: Insufficient documentation

## 2017-03-31 DIAGNOSIS — Z79899 Other long term (current) drug therapy: Secondary | ICD-10-CM | POA: Diagnosis not present

## 2017-03-31 DIAGNOSIS — R109 Unspecified abdominal pain: Secondary | ICD-10-CM

## 2017-03-31 DIAGNOSIS — I10 Essential (primary) hypertension: Secondary | ICD-10-CM | POA: Insufficient documentation

## 2017-03-31 DIAGNOSIS — M545 Low back pain: Secondary | ICD-10-CM | POA: Diagnosis not present

## 2017-03-31 DIAGNOSIS — N200 Calculus of kidney: Secondary | ICD-10-CM | POA: Diagnosis not present

## 2017-03-31 DIAGNOSIS — Z87891 Personal history of nicotine dependence: Secondary | ICD-10-CM | POA: Insufficient documentation

## 2017-03-31 LAB — CBC WITH DIFFERENTIAL/PLATELET
BASOS ABS: 0 10*3/uL (ref 0.0–0.1)
BASOS PCT: 0 %
Eosinophils Absolute: 0 10*3/uL (ref 0.0–0.7)
Eosinophils Relative: 1 %
HEMATOCRIT: 37.1 % — AB (ref 39.0–52.0)
Hemoglobin: 12.2 g/dL — ABNORMAL LOW (ref 13.0–17.0)
LYMPHS PCT: 21 %
Lymphs Abs: 1.1 10*3/uL (ref 0.7–4.0)
MCH: 26.6 pg (ref 26.0–34.0)
MCHC: 32.9 g/dL (ref 30.0–36.0)
MCV: 80.8 fL (ref 78.0–100.0)
Monocytes Absolute: 0.3 10*3/uL (ref 0.1–1.0)
Monocytes Relative: 6 %
NEUTROS ABS: 4 10*3/uL (ref 1.7–7.7)
NEUTROS PCT: 72 %
Platelets: 127 10*3/uL — ABNORMAL LOW (ref 150–400)
RBC: 4.59 MIL/uL (ref 4.22–5.81)
RDW: 16.6 % — AB (ref 11.5–15.5)
WBC: 5.5 10*3/uL (ref 4.0–10.5)

## 2017-03-31 LAB — I-STAT CHEM 8, ED
BUN: 11 mg/dL (ref 6–20)
CREATININE: 1.1 mg/dL (ref 0.61–1.24)
Calcium, Ion: 1.18 mmol/L (ref 1.15–1.40)
Chloride: 105 mmol/L (ref 101–111)
GLUCOSE: 104 mg/dL — AB (ref 65–99)
HEMATOCRIT: 34 % — AB (ref 39.0–52.0)
HEMOGLOBIN: 11.6 g/dL — AB (ref 13.0–17.0)
Potassium: 4 mmol/L (ref 3.5–5.1)
Sodium: 141 mmol/L (ref 135–145)
TCO2: 23 mmol/L (ref 22–32)

## 2017-03-31 LAB — URINALYSIS, ROUTINE W REFLEX MICROSCOPIC
BACTERIA UA: NONE SEEN
Bilirubin Urine: NEGATIVE
Glucose, UA: NEGATIVE mg/dL
HGB URINE DIPSTICK: NEGATIVE
Ketones, ur: NEGATIVE mg/dL
Leukocytes, UA: NEGATIVE
Nitrite: NEGATIVE
PROTEIN: 30 mg/dL — AB
SQUAMOUS EPITHELIAL / LPF: NONE SEEN
Specific Gravity, Urine: 1.017 (ref 1.005–1.030)
pH: 5 (ref 5.0–8.0)

## 2017-03-31 LAB — HEPATIC FUNCTION PANEL
ALT: 14 U/L — AB (ref 17–63)
AST: 19 U/L (ref 15–41)
Albumin: 4.1 g/dL (ref 3.5–5.0)
Alkaline Phosphatase: 101 U/L (ref 38–126)
BILIRUBIN DIRECT: 0.3 mg/dL (ref 0.1–0.5)
Indirect Bilirubin: 1.1 mg/dL — ABNORMAL HIGH (ref 0.3–0.9)
Total Bilirubin: 1.4 mg/dL — ABNORMAL HIGH (ref 0.3–1.2)
Total Protein: 6.4 g/dL — ABNORMAL LOW (ref 6.5–8.1)

## 2017-03-31 LAB — LIPASE, BLOOD: LIPASE: 22 U/L (ref 11–51)

## 2017-03-31 MED ORDER — IOPAMIDOL (ISOVUE-300) INJECTION 61%
INTRAVENOUS | Status: AC
  Administered 2017-03-31: 100 mL
  Filled 2017-03-31: qty 100

## 2017-03-31 MED ORDER — MORPHINE SULFATE (PF) 4 MG/ML IV SOLN
4.0000 mg | Freq: Once | INTRAVENOUS | Status: AC
  Administered 2017-03-31: 4 mg via INTRAVENOUS
  Filled 2017-03-31: qty 1

## 2017-03-31 MED ORDER — HYDROCODONE-ACETAMINOPHEN 5-325 MG PO TABS
1.0000 | ORAL_TABLET | Freq: Once | ORAL | Status: AC
  Administered 2017-04-01: 1 via ORAL
  Filled 2017-03-31: qty 1

## 2017-03-31 NOTE — ED Triage Notes (Signed)
Pt c/o of Right flank pain that started 2 days ago. It would come and go then but has been constant today since 1513. PCP sent patient here. Urine was normal per patients family. Pt states he has a hx of kidney stones and gall stone but this feels worse.

## 2017-03-31 NOTE — ED Notes (Signed)
Pt alert and oriented x 4 . Pt has rt sided flank pain that started 2 days ago. Pt reports 10/10 sharp pain that worsens with movement. Pt denies pain and burning with urination.

## 2017-03-31 NOTE — ED Provider Notes (Addendum)
[facility] DEPT Provider Note   CSN: [id number] Arrival date & time: 03/31/17  1725     History   Chief Complaint Chief Complaint  Patient presents with  . Flank Pain    HPI Brandon Fischer is a 84 y.o. male.  HPI  84 year old comes in with chief complaint of right-sided abdominal pain. Patient sent to the ER by PCP. Patient has history of renal stones, PSVT, CAD, and is status post cholecystectomy.  Patient states that he started having right-sided abdominal pain 2 days ago, initially it was intermittent but now is constant.  Pain is described as sharp and severe pain that is worse with any movement.  Patient denies any associated nausea, vomiting, fevers, chills.  Patient denies any trauma or UTI-like symptoms.  Past Medical History:  Diagnosis Date  . At risk for sleep apnea    STOP-BANG= 4    SENT TO PCP 02-06-2014  . Bilateral carotid artery stenosis without cerebral infarction    ASYMPTOMATIC--  BILATERAL ICA  50-69%  PER CARDIOLOGIST NOTE, DR Einar Gip  . Bilateral carotid bruits    LEFT > RIGHT  . Coronary artery disease cardiologist-- dr Stevphen [place] CAD  per cath 08-06-2004 HX PALPITATIONS-AND -TACHYCARDIA-  . First degree heart block   . Full dentures   . GERD (gastroesophageal reflux disease)   . Heart palpitations    Not any more  . History of atrial fibrillation without current medication    EPISODE OF AFIB WITH RVR INTRAOPERATIVELY 02-09-2014 AT [facility]--  RESOLVED AND PT FOLLOWED UP WITH CARDIOLOGIST  DR Einar Gip  . History of kidney stones   . Hyperlipidemia   . Hypertension   . Nephrolithiasis    RIGHT  . Organic impotence   . Psoriasis    Clearing up  . PSVT (paroxysmal supraventricular tachycardia) ([place])   . Right ureteral calculus   . S/P radiofrequency ablation operation for arrhythmia-SVT 04/28/2014  . Simple renal cyst    right  . Wears glasses   . Wears hearing aid    BILATERAL    Patient Active  Problem List   Diagnosis Date Noted  . S/P radiofrequency ablation operation for arrhythmia-SVT 04/28/2014  . SVT (supraventricular tachycardia) ([place]) 03/15/2014  . Nephrolithiasis 01/17/2011  . Hypertension   . Dyslipidemia   . Palpitation     Past Surgical History:  Procedure Laterality Date  . CARDIAC CATHETERIZATION  08-06-2004 dr Verlon Setting   (abnormal stress test) Non-obstructive CAD, pLAD 20-30%,  LCX 30-40%, pRCA 20-30%, dRCA 40%, distal LM  mild diffuse calcifation 20-30% tapering stenosis,  preserved LVSF ef 55-60%  . CARPAL TUNNEL RELEASE Bilateral 2005  . CATARACT EXTRACTION, BILATERAL Bilateral 2011  . CHOLECYSTECTOMY OPEN  1982   and NEPHROLITHOTOMY  . COLONOSCOPY W/ POLYPECTOMY  last one 04-05-2008  . CYSTOSCOPY WITH RETROGRADE PYELOGRAM, URETEROSCOPY AND STENT PLACEMENT Right 03/14/2014   Procedure: CYSTO/RIGHT RETROGRADE PYELOGRAM/RIGHT URETEROSCOPY/STONE EXTRACTION/STENT PLACEMENT;  Surgeon: Malka So, MD;  Location: [facility];  Service: Urology;  Laterality: Right;  . HOLMIUM LASER APPLICATION Right 4/0/9811   Procedure: RIGHT HOLMIUM LASER APPLICATION;  Surgeon: Malka So, MD;  Location[facility];  Service: Urology;  Laterality: Right;  . KNEE ARTHROSCOPY W/ DEBRIDEMENT Right 11-23-2002   and Synovectomy  . NEPHROLITHOTOMY  01/16/2011   Procedure: NEPHROLITHOTOMY PERCUTANEOUS;  Surgeon: Malka So;  Location: [facility];  Service: Urology;  Laterality: Left;  C-Arm  Holmium Laser  . NEPHROLITHOTOMY  [place] N/A 04/28/2014   Procedure: SUPRAVENTRICULAR TACHYCARDIA ABLATION;  Surgeon: Evans Lance, MD;  Location: [facility] CATH LAB;  Service: Cardiovascular;  Laterality: N/A;       Home Medications    Prior to Admission medications   Medication Sig Start Date End Date Taking? Authorizing Provider  aspirin 81 MG tablet Take 81 mg by mouth every other day.    Yes [provider]    atorvastatin (LIPITOR) 20 MG tablet Take 20 mg by mouth daily.   Yes [provider]  diphenhydramine-acetaminophen (TYLENOL PM) 25-500 MG TABS Take 2 tablets by mouth at bedtime.   Yes [provider]  losartan (COZAAR) 50 MG tablet Take 50 mg by mouth daily.   Yes [provider]  pantoprazole (PROTONIX) 40 MG tablet Take 40 mg by mouth every morning.    Yes [provider]  traMADol (ULTRAM) 50 MG tablet Take 50 mg by mouth every 6 (six) hours as needed for moderate pain.    Yes [provider]    Family History Family History  Problem Relation Age of Onset  . Cancer Mother        Ovarian or colon  . Heart attack Father   . Heart attack Brother     Social History Social History   Tobacco Use  . Smoking status: Former Smoker    Packs/day: 1.00    Years: 40.00    Pack years: 40.00    Types: Cigarettes    Last attempt to quit: 02/06/1998    Years since quitting: 19.1  . Smokeless tobacco: Never Used  Substance Use Topics  . Alcohol use: No  . Drug use: No     Allergies   Patient has no known allergies.   Review of Systems Review of Systems  Constitutional: Positive for activity change.  Cardiovascular: Negative for chest pain.  Gastrointestinal: Positive for abdominal pain.  Genitourinary: Negative for dysuria.  Musculoskeletal: Negative for joint swelling.  Allergic/Immunologic: Negative for immunocompromised state.  All other systems reviewed and are negative.    Physical Exam Updated Vital Signs BP (!) 188/87   Pulse 62   Temp 98.5 F (36.9 C)   Resp 18   SpO2 100%   Physical Exam  Constitutional: He is oriented to person, place, and time. He appears well-developed.  HENT:  Head: Atraumatic.  Neck: Neck supple.  Cardiovascular: Normal rate.  Pulmonary/Chest: Effort normal.  Abdominal: Soft. There is tenderness. There is guarding. There is no rebound.  Musculoskeletal:  No tenderness with passive leg  raise or internal and external rotation of the hip  Neurological: He is alert and oriented to person, place, and time.  Skin: Skin is warm.  Nursing note and vitals reviewed.    ED Treatments / Results  Labs (all labs ordered are listed, but only abnormal results are displayed) Labs Reviewed  URINALYSIS, ROUTINE W REFLEX MICROSCOPIC - Abnormal; Notable for the following components:      Result Value   Protein, ur 30 (*)    All other components within normal limits  CBC WITH DIFFERENTIAL/PLATELET - Abnormal; Notable for the following components:   Hemoglobin 12.2 (*)    HCT 37.1 (*)    RDW 16.6 (*)    Platelets 127 (*)    All other components within normal limits  HEPATIC FUNCTION PANEL - Abnormal; Notable for the following components:   Total Protein 6.4 (*)    ALT 14 (*)  Total Bilirubin 1.4 (*)    Indirect Bilirubin 1.1 (*)    All other components within normal limits  I-STAT CHEM 8, ED - Abnormal; Notable for the following components:   Glucose, Bld 104 (*)    Hemoglobin 11.6 (*)    HCT 34.0 (*)    All other components within normal limits  LIPASE, BLOOD    EKG  EKG Interpretation None       Radiology Ct Abdomen Pelvis W Contrast  Result Date: 03/31/2017 CLINICAL DATA:  Right-sided flank pain. EXAM: CT ABDOMEN AND PELVIS WITH CONTRAST TECHNIQUE: Multidetector CT imaging of the abdomen and pelvis was performed using the standard protocol following bolus administration of intravenous contrast. CONTRAST:  183mL ISOVUE-300 IOPAMIDOL (ISOVUE-300) INJECTION 61% COMPARISON:  CT abdomen pelvis 01/24/2009 FINDINGS: Lower chest: No basilar pulmonary nodules or pleural effusion. No apical pericardial effusion. Hepatobiliary: Normal hepatic contours and density. No visible biliary dilatation. Normal gallbladder. Pancreas: Normal parenchymal contours without ductal dilatation. No peripancreatic fluid collection. Spleen: Normal. Adrenals/Urinary Tract: --Adrenal glands: Normal.  --Right kidney/ureter: No hydronephrosis. Decreased number of renal stones. Nonobstructive interpolar calculus measures 3 mm. Multiple renal cysts, the largest of which measures 3.7 cm. --Left kidney/ureter: Decreased burden of renal calculi. There are nonobstructive stones the upper pole, lower pole and in the interpolar region. The largest measures 8 mm. No hydronephrosis. The ureter is unobstructed. --Urinary bladder: There are multiple calculi lying dependently within the urinary bladder, measuring up to 12 mm. Stomach/Bowel: --Stomach/Duodenum: No hiatal hernia or other gastric abnormality. Normal duodenal course. --Small bowel: No dilatation or inflammation. --Colon: No focal abnormality. --Appendix: Normal. Vascular/Lymphatic: Atherosclerotic calcification is present within the non-aneurysmal abdominal aorta, without hemodynamically significant stenosis. The portal vein, splenic vein, superior mesenteric vein and IVC are patent. No abdominal or pelvic lymphadenopathy. Reproductive: Heterogeneous prostate is upper limits of normal in size. Musculoskeletal. Multilevel degenerative disc disease and facet arthrosis. No bony spinal canal stenosis. Other: None. IMPRESSION: 1. Decreased burden of nephrolithiasis.  No obstructive uropathy. 2. Stones resting dependently within the urinary bladder without bladder outlet obstruction. 3. No acute abnormality of the abdomen or pelvis. 4.  Aortic Atherosclerosis (ICD10-I70.0). Electronically Signed   By: Ulyses Jarred M.D.   On: 03/31/2017 22:03    Procedures Procedures (including critical care time)  Medications Ordered in ED Medications  HYDROcodone-acetaminophen (NORCO/VICODIN) 5-325 MG per tablet 1 tablet (not administered)  morphine 4 MG/ML injection 4 mg (4 mg Intravenous Given 03/31/17 1954)  iopamidol (ISOVUE-300) 61 % injection (100 mLs  Contrast Given 03/31/17 2129)     Initial Impression / Assessment and Plan / ED Course  I have reviewed the triage  vital signs and the nursing notes.  Pertinent labs & imaging results that were available during my care of the patient were reviewed by me and considered in my medical decision making (see chart for details).  Clinical Course as of Apr 01 13  Wed Apr 01, 2017  0014 Results from the ER workup discussed with the patient face to face and all questions answered to the best of my ability.  Pt is not having worsening of the pain. Strict ER return precautions have been discussed, and patient is agreeing with the plan and is comfortable with the workup done and the recommendations from the ER.  CT ABDOMEN PELVIS W CONTRAST [AN]    Clinical Course User Index [AN] Varney Biles, MD    84 year old male comes in with chief complaint of right-sided abdominal pain.  Patient's abdominal exam  reveals tenderness in the right lower quadrant, a little bit more lateral to the McBurney's point.  Patient also has worsening of his pain with any position, however he does not have any tenderness with internal and external rotation of the hip.  Labs are reassuring.  Pain is getting worse over the past few days and now is constant.  We will get a CT scan.  Final Clinical Impressions(s) / ED Diagnoses   Final diagnoses:  Right flank pain  Right lower quadrant pain    ED Discharge Orders    None       Varney Biles, MD 04/01/17 1115    Varney Biles, MD 04/01/17 5208

## 2017-04-01 DIAGNOSIS — R1031 Right lower quadrant pain: Secondary | ICD-10-CM | POA: Diagnosis not present

## 2017-04-01 MED ORDER — HYDROCODONE-ACETAMINOPHEN 5-325 MG PO TABS: 1.0000 | ORAL_TABLET | Freq: Four times a day (QID) | ORAL | 0 refills | Status: DC | PRN

## 2017-04-01 NOTE — Discharge Instructions (Signed)
  All the results in the ER are normal, labs and imaging. We are not sure what is causing your symptoms. The workup in the ER is not complete, and is limited to screening for life threatening and emergent conditions only, so please see a primary care doctor for further evaluation.  Please return to the ER if your symptoms worsen; you have increased pain, fevers, chills, inability to keep any medications down, confusion. Otherwise see the outpatient doctor as requested.  

## 2017-04-02 DIAGNOSIS — N210 Calculus in bladder: Secondary | ICD-10-CM | POA: Diagnosis not present

## 2017-04-02 DIAGNOSIS — N200 Calculus of kidney: Secondary | ICD-10-CM | POA: Diagnosis not present

## 2017-04-03 ENCOUNTER — Other Ambulatory Visit: Payer: Self-pay | Admitting: Urology

## 2017-04-07 NOTE — Patient Instructions (Addendum)
Brandon Fischer  04/07/2017   Your procedure is scheduled on: 04-14-17   Report to [facility] Main  Entrance   ARRIVE AT 530 AM. Have a seat in the Main Lobby. Please note there is a phone at the [employer]. Please call [phone] on that phone. Someone from Short Stay will come and get you from the Main Lobby and take you to Short Stay.   Call this number if you have problems the morning of surgery [phone]   Remember: Do not eat food or drink liquids :After Midnight.     Take these medicines the morning of surgery with A SIP OF WATER: None                                 You may not have any metal on your body including hair pins and              piercings  Do not wear jewelry,  lotions, powders, deodorant              Men may shave face and neck.   Do not bring valuables to the hospital. [place].  Contacts, dentures or bridgework may not be worn into surgery.     Patients discharged the day of surgery will not be allowed to drive home.  Name and phone number of your driver: Thayer Headings [phone]               Please read over the following fact sheets you were given: _____________________________________________________________________          [facility] - Preparing for Surgery Before surgery, you can play an important role.  Because skin is not sterile, your skin needs to be as free of germs as possible.  You can reduce the number of germs on your skin by washing with CHG (chlorahexidine gluconate) soap before surgery.  CHG is an antiseptic cleaner which kills germs and bonds with the skin to continue killing germs even after washing. Please DO NOT use if you have an allergy to CHG or antibacterial soaps.  If your skin becomes reddened/irritated stop using the CHG and inform your nurse when you arrive at Short Stay. Do not shave (including legs and underarms) for at least 48 hours  prior to the first CHG shower.  You may shave your face/neck. Please follow these instructions carefully:  1.  Shower with CHG Soap the night before surgery and the  morning of Surgery.  2.  If you choose to wash your hair, wash your hair first as usual with your  normal  shampoo.  3.  After you shampoo, rinse your hair and body thoroughly to remove the  shampoo.                           4.  Use CHG as you would any other liquid soap.  You can apply chg directly  to the skin and wash                       Gently with a scrungie or clean washcloth.  5.  Apply the CHG Soap to your body ONLY FROM  THE NECK DOWN.   Do not use on face/ open                           Wound or open sores. Avoid contact with eyes, ears mouth and genitals (private parts).                       Wash face,  Genitals (private parts) with your normal soap.             6.  Wash thoroughly, paying special attention to the area where your surgery  will be performed.  7.  Thoroughly rinse your body with warm water from the neck down.  8.  DO NOT shower/wash with your normal soap after using and rinsing off  the CHG Soap.                9.  Pat yourself dry with a clean towel.            10.  Wear clean pajamas.            11.  Place clean sheets on your bed the night of your first shower and do not  sleep with pets. Day of Surgery : Do not apply any lotions/deodorants the morning of surgery.  Please wear clean clothes to the hospital/surgery center.  FAILURE TO FOLLOW THESE INSTRUCTIONS MAY RESULT IN THE CANCELLATION OF YOUR SURGERY PATIENT SIGNATURE_________________________________  NURSE SIGNATURE__________________________________  ________________________________________________________________________

## 2017-04-08 ENCOUNTER — Other Ambulatory Visit: Payer: Self-pay

## 2017-04-08 DIAGNOSIS — L814 Other melanin hyperpigmentation: Secondary | ICD-10-CM | POA: Diagnosis not present

## 2017-04-08 DIAGNOSIS — D229 Melanocytic nevi, unspecified: Secondary | ICD-10-CM | POA: Diagnosis not present

## 2017-04-08 DIAGNOSIS — L578 Other skin changes due to chronic exposure to nonionizing radiation: Secondary | ICD-10-CM | POA: Diagnosis not present

## 2017-04-08 DIAGNOSIS — D485 Neoplasm of uncertain behavior of skin: Secondary | ICD-10-CM | POA: Diagnosis not present

## 2017-04-08 DIAGNOSIS — L570 Actinic keratosis: Secondary | ICD-10-CM | POA: Diagnosis not present

## 2017-04-08 DIAGNOSIS — L820 Inflamed seborrheic keratosis: Secondary | ICD-10-CM | POA: Diagnosis not present

## 2017-04-08 DIAGNOSIS — D1801 Hemangioma of skin and subcutaneous tissue: Secondary | ICD-10-CM | POA: Diagnosis not present

## 2017-04-08 DIAGNOSIS — L821 Other seborrheic keratosis: Secondary | ICD-10-CM | POA: Diagnosis not present

## 2017-04-09 ENCOUNTER — Encounter (HOSPITAL_COMMUNITY)
Admission: RE | Admit: 2017-04-09 | Discharge: 2017-04-09 | Disposition: A | Discharge status: Home / Self Care | Payer: Medicare Other | Source: Ambulatory Visit | Attending: Urology | Admitting: Urology

## 2017-04-09 ENCOUNTER — Encounter (HOSPITAL_COMMUNITY): Payer: Self-pay

## 2017-04-09 ENCOUNTER — Other Ambulatory Visit: Payer: Self-pay

## 2017-04-09 DIAGNOSIS — N210 Calculus in bladder: Secondary | ICD-10-CM | POA: Insufficient documentation

## 2017-04-09 DIAGNOSIS — I491 Atrial premature depolarization: Secondary | ICD-10-CM | POA: Insufficient documentation

## 2017-04-09 DIAGNOSIS — I440 Atrioventricular block, first degree: Secondary | ICD-10-CM | POA: Insufficient documentation

## 2017-04-09 DIAGNOSIS — I10 Essential (primary) hypertension: Secondary | ICD-10-CM | POA: Insufficient documentation

## 2017-04-09 DIAGNOSIS — Z01818 Encounter for other preprocedural examination: Secondary | ICD-10-CM | POA: Insufficient documentation

## 2017-04-09 DIAGNOSIS — Z01812 Encounter for preprocedural laboratory examination: Secondary | ICD-10-CM | POA: Diagnosis not present

## 2017-04-09 LAB — BASIC METABOLIC PANEL
ANION GAP: 9 (ref 5–15)
BUN: 18 mg/dL (ref 6–20)
CO2: 26 mmol/L (ref 22–32)
Calcium: 9 mg/dL (ref 8.9–10.3)
Chloride: 107 mmol/L (ref 101–111)
Creatinine, Ser: 1.26 mg/dL — ABNORMAL HIGH (ref 0.61–1.24)
GFR calc Af Amer: 60 mL/min — ABNORMAL LOW (ref >60)
GFR, EST NON AFRICAN AMERICAN: 51 mL/min — AB (ref >60)
GLUCOSE: 104 mg/dL — AB (ref 65–99)
Potassium: 4.5 mmol/L (ref 3.5–5.1)
Sodium: 142 mmol/L (ref 135–145)

## 2017-04-09 LAB — CBC
HCT: 36.6 % — ABNORMAL LOW (ref 39.0–52.0)
Hemoglobin: 11.7 g/dL — ABNORMAL LOW (ref 13.0–17.0)
MCH: 25.9 pg — ABNORMAL LOW (ref 26.0–34.0)
MCHC: 32 g/dL (ref 30.0–36.0)
MCV: 81.2 fL (ref 78.0–100.0)
Platelets: 123 10*3/uL — ABNORMAL LOW (ref 150–400)
RBC: 4.51 MIL/uL (ref 4.22–5.81)
RDW: 16.3 % — ABNORMAL HIGH (ref 11.5–15.5)
WBC: 4.2 10*3/uL (ref 4.0–10.5)

## 2017-04-13 NOTE — H&P (Signed)
CC: I have pain in the flank.  HPI: Brandon Fischer is a 84 year-old male established patient who is here for flank pain.  The problem is on the right side. His pain started about approximately 03/29/2017.   Brandon Fischer returns today with a 3-4 day history of right lower quadrant pain. the pain is worse with active and did hurt when he was still. He had tr heme at Dr. Pennie Banter office but he had hematuria in 10/18 and thinks he passed a left sided stone. He has some increased frequency and urgency. He has no nausea. On CT there was 1.2cm bladder stone and a smaller accompanying stone. The stone was in the right distal ureter on a CT/PET in 9/18 and was probably in the same location on KUB in 9/16.      CC: AUA Questions Scoring.  HPI:     AUA Symptom Score: More than 50% of the time he has the sensation of not emptying his bladder completely when finished urinating. 50% of the time he has to urinate again fewer than two hours after he has finished urinating. Less than 20% of the time he has to start and stop again several times when he urinates. 50% of the time he finds it difficult to postpone urination. More than 50% of the time he has a weak urinary stream. Less than 50% of the time he has to push or strain to begin urination. He has to get up to urinate 2 times from the time he goes to bed until the time he gets up in the morning.   Calculated AUA Symptom Score: 19    ALLERGIES: No Allergies    MEDICATIONS: Adult Aspirin Low Strength 81 MG TBDP Oral  Atorvastatin Calcium 80 MG Oral Tablet Oral  Hydrocodone-Acetaminophen 5 mg-325 mg tablet  Losartan Potassium 50 mg tablet  Pantoprazole Sodium 40 mg tablet, delayed release  Tramadol Hcl 50 mg tablet Oral  Tylenol Pm Extra Strength 500 mg-25 mg tablet     GU PSH: Cysto Uretero Lithotripsy - 2016 Cystoscopy Insert Stent - 2016 Percut Stone Removal >2cm - 2013 Remove Kidney Stone - 2008      [facility] Notes: Catheter Ablation Atrial  Fibrillation, Cystoscopy With Ureteroscopy With Lithotripsy, Cystoscopy With Insertion Of Ureteral Stent Right, Percutaneous Lithotomy For Stone Over 2cm., Cholecystectomy, Knee Surgery, Lithotomy   NON-GU PSH: Cholecystectomy (open) - 2008    GU PMH: Renal calculus, Bilateral, He is doing well without complaints. I will have him return in a year with a KUB and UA. - 08/06/2016, Kidney stone on right side, - 10/11/2014, Kidney stone on left side, - 10/11/2014 Other microscopic hematuria, Microscopic hematuria - 10/11/2014 Ureteral calculus, Calculus of ureter - 2016 Bladder Stone, Bladder calculus - 2016 Proteinuria, Isolated proteinuria - 2016 Renal cyst, Renal cyst, acquired, right - 2016 ED due to arterial insufficiency, Erectile dysfunction due to arterial insufficiency - 2014 BPH w/o LUTS, Benign prostatic hypertrophy without lower urinary tract symptoms - 2014 Gross hematuria, Gross Hematuria - 2014 History of urolithiasis, Nephrolithiasis - 2014 Hydronephrosis Unspec, Hydronephrosis - 2014 Personal Hx Oth male genital organs diseases, History of balanitis - 2014      PMH Notes:  2011-04-23 12:49:49 - Note: Nephrolithiasis Of The Left Kidney  2009-12-28 14:37:10 - Note: Arthritis   NON-GU PMH: Personal history of other diseases of the circulatory system, History of atrial fibrillation - 10/11/2014, History of hypertension, - 2014 Encounter for general adult medical examination without abnormal findings, Encounter for preventive  health examination - 2014 Personal history of other endocrine, nutritional and metabolic disease, History of hypercholesterolemia - 2014    FAMILY HISTORY: Acute Myocardial Infarction - Father Colon Cancer - Mother Death In The Family Father - Runs In Family Death In The Family Mother - Runs In Family Family Health Status Number - Runs In Family   SOCIAL HISTORY: Marital Status: Married Preferred Language: English; Race: White Patient's occupation is/was  retired.     Notes: Former smoker, Alcohol Use, Tobacco Use, Occupation:, Caffeine Use, Marital History - Currently Married   REVIEW OF SYSTEMS:    GU Review Male:   Patient reports get up at night to urinate and trouble starting your stream. Patient denies frequent urination, hard to postpone urination, burning/ pain with urination, leakage of urine, stream starts and stops, have to strain to urinate , erection problems, and penile pain.  Gastrointestinal (Upper):   Patient denies nausea, vomiting, and indigestion/ heartburn.  Gastrointestinal (Lower):   Patient denies diarrhea and constipation.  Constitutional:   Patient denies fever, night sweats, weight loss, and fatigue.  Skin:   Patient denies skin rash/ lesion and itching.  Eyes:   Patient denies double vision and blurred vision.  Ears/ Nose/ Throat:   Patient denies sore throat and sinus problems.  Hematologic/Lymphatic:   Patient denies swollen glands and easy bruising.  Cardiovascular:   Patient denies leg swelling and chest pains.  Respiratory:   Patient denies cough and shortness of breath.  Endocrine:   Patient denies excessive thirst.  Musculoskeletal:   right hip/groin. Patient reports joint pain. Patient denies back pain.  Neurological:   Patient denies headaches and dizziness.  Psychologic:   Patient denies depression and anxiety.   VITAL SIGNS:      04/02/2017 12:40 PM  Weight 200 lb / 90.72 kg  Height 71 in / 180.34 cm  BP 156/81 mmHg  Pulse 71 /min  Temperature 97.6 F / 36.4 C  BMI 27.9 kg/m   MULTI-SYSTEM PHYSICAL EXAMINATION:    Constitutional: Well-nourished. No physical deformities. Normally developed. Good grooming.  Respiratory: No labored breathing, no use of accessory muscles. Normal breath sounds.  Cardiovascular: Regular rate and rhythm. No murmur, no gallop.   Gastrointestinal: Obese abdomen. No mass, no tenderness, no rigidity.   Musculoskeletal: He has tenderness of the right iliac that is worse  with rotation of the back and lateral rotation of the right hip.     PAST DATA REVIEWED:  Source Of History:  Patient  Urine Test Review:   Urinalysis  X-Ray Review: C.T. Abdomen/Pelvis: Reviewed Films. Reviewed Report. Discussed With Patient. From 03/31/17 see note. Compared with prior PET/CT and KUB's.     12/21/09 08/10/08 11/11/06  PSA  Total PSA 0.86  1.25  0.76     PROCEDURES:          Urinalysis w/Scope Dipstick Dipstick Cont'd Micro  Color: Amber Bilirubin: Neg WBC/hpf: 0 - 5/hpf  Appearance: Clear Ketones: Neg RBC/hpf: 0 - 2/hpf  Specific Gravity: 1.030 Blood: Neg Bacteria: NS (Not Seen)  pH: 5.5 Protein: 2+ Cystals: NS (Not Seen)  Glucose: Neg Urobilinogen: 1.0 Casts: NS (Not Seen)    Nitrites: Neg Trichomonas: Not Present    Leukocyte Esterase: Neg Mucous: Present      Epithelial Cells: 0 - 5/hpf      Yeast: NS (Not Seen)      Sperm: Not Present    ASSESSMENT:      ICD-10 Details  1 GU:   Flank  Pain - R10.84 I think pain is more musculoskeletal possibly related to his back with DDD or his hip with arthritis.   2   Bladder Stone - N21.0 He ahs a 1.2cm bladder stone with smaller stones He will need those removed. I reviewed the risks of bleeding, infection, bladder and urethral injury, thrombotic events and anesthetic complications.   3   Renal calculus - N20.0 Bilateral, Improving - He has reduced renal stone burden.    PLAN:           Schedule Return Visit/Planned Activity: Next Available Appointment - Schedule Surgery          Document Letter(s):  Created for Patient: Clinical Summary

## 2017-04-14 ENCOUNTER — Encounter (HOSPITAL_COMMUNITY): Payer: Self-pay | Admitting: Emergency Medicine

## 2017-04-14 ENCOUNTER — Other Ambulatory Visit: Payer: Self-pay

## 2017-04-14 ENCOUNTER — Ambulatory Visit (HOSPITAL_COMMUNITY)
Admission: RE | Admit: 2017-04-14 | Discharge: 2017-04-14 | Disposition: A | Discharge status: Home / Self Care | Payer: Medicare Other | Source: Ambulatory Visit | Attending: Urology | Admitting: Urology

## 2017-04-14 ENCOUNTER — Ambulatory Visit (HOSPITAL_COMMUNITY): Payer: Medicare Other

## 2017-04-14 ENCOUNTER — Ambulatory Visit (HOSPITAL_COMMUNITY): Payer: Medicare Other | Admitting: Certified Registered Nurse Anesthetist

## 2017-04-14 ENCOUNTER — Encounter (HOSPITAL_COMMUNITY)
Admission: RE | Disposition: A | Discharge status: Home / Self Care | Payer: Self-pay | Source: Ambulatory Visit | Attending: Urology

## 2017-04-14 DIAGNOSIS — N210 Calculus in bladder: Secondary | ICD-10-CM | POA: Diagnosis not present

## 2017-04-14 DIAGNOSIS — R310 Gross hematuria: Secondary | ICD-10-CM | POA: Diagnosis not present

## 2017-04-14 DIAGNOSIS — Z800 Family history of malignant neoplasm of digestive organs: Secondary | ICD-10-CM | POA: Diagnosis not present

## 2017-04-14 DIAGNOSIS — Z87442 Personal history of urinary calculi: Secondary | ICD-10-CM | POA: Diagnosis not present

## 2017-04-14 DIAGNOSIS — K219 Gastro-esophageal reflux disease without esophagitis: Secondary | ICD-10-CM | POA: Diagnosis not present

## 2017-04-14 DIAGNOSIS — I10 Essential (primary) hypertension: Secondary | ICD-10-CM | POA: Diagnosis not present

## 2017-04-14 DIAGNOSIS — Z9049 Acquired absence of other specified parts of digestive tract: Secondary | ICD-10-CM | POA: Diagnosis not present

## 2017-04-14 DIAGNOSIS — I2510 Atherosclerotic heart disease of native coronary artery without angina pectoris: Secondary | ICD-10-CM | POA: Diagnosis not present

## 2017-04-14 DIAGNOSIS — Z87891 Personal history of nicotine dependence: Secondary | ICD-10-CM | POA: Insufficient documentation

## 2017-04-14 DIAGNOSIS — I739 Peripheral vascular disease, unspecified: Secondary | ICD-10-CM | POA: Insufficient documentation

## 2017-04-14 DIAGNOSIS — Z79899 Other long term (current) drug therapy: Secondary | ICD-10-CM | POA: Insufficient documentation

## 2017-04-14 DIAGNOSIS — I471 Supraventricular tachycardia: Secondary | ICD-10-CM | POA: Diagnosis not present

## 2017-04-14 DIAGNOSIS — R1031 Right lower quadrant pain: Secondary | ICD-10-CM | POA: Diagnosis not present

## 2017-04-14 HISTORY — PX: CYSTOSCOPY WITH LITHOLAPAXY: SHX1425

## 2017-04-14 SURGERY — CYSTOSCOPY, WITH BLADDER CALCULUS LITHOLAPAXY
Anesthesia: General | Site: Ureter | Laterality: Right

## 2017-04-14 MED ORDER — SODIUM CHLORIDE 0.9 % IR SOLN
Status: DC | PRN
  Administered 2017-04-14: 1000 mL via INTRAVESICAL

## 2017-04-14 MED ORDER — ACETAMINOPHEN 650 MG RE SUPP
650.0000 mg | RECTAL | Status: DC | PRN
  Filled 2017-04-14: qty 1

## 2017-04-14 MED ORDER — CEFAZOLIN SODIUM-DEXTROSE 2-4 GM/100ML-% IV SOLN
2.0000 g | INTRAVENOUS | Status: AC
  Administered 2017-04-14: 2 g via INTRAVENOUS
  Filled 2017-04-14: qty 100

## 2017-04-14 MED ORDER — LIDOCAINE 2% (20 MG/ML) 5 ML SYRINGE
INTRAMUSCULAR | Status: AC
  Filled 2017-04-14: qty 5

## 2017-04-14 MED ORDER — DEXAMETHASONE SODIUM PHOSPHATE 10 MG/ML IJ SOLN
INTRAMUSCULAR | Status: DC | PRN
  Administered 2017-04-14: 10 mg via INTRAVENOUS

## 2017-04-14 MED ORDER — PROPOFOL 10 MG/ML IV BOLUS
INTRAVENOUS | Status: AC
  Filled 2017-04-14: qty 20

## 2017-04-14 MED ORDER — STERILE WATER FOR IRRIGATION IR SOLN
Status: DC | PRN
  Administered 2017-04-14: 3000 mL via INTRAVESICAL

## 2017-04-14 MED ORDER — PROPOFOL 10 MG/ML IV BOLUS
INTRAVENOUS | Status: DC | PRN
  Administered 2017-04-14: 200 mg via INTRAVENOUS

## 2017-04-14 MED ORDER — ONDANSETRON HCL 4 MG/2ML IJ SOLN
INTRAMUSCULAR | Status: DC | PRN
  Administered 2017-04-14: 4 mg via INTRAVENOUS

## 2017-04-14 MED ORDER — ONDANSETRON HCL 4 MG/2ML IJ SOLN
INTRAMUSCULAR | Status: AC
  Filled 2017-04-14: qty 2

## 2017-04-14 MED ORDER — DEXAMETHASONE SODIUM PHOSPHATE 10 MG/ML IJ SOLN
INTRAMUSCULAR | Status: AC
  Filled 2017-04-14: qty 1

## 2017-04-14 MED ORDER — MORPHINE SULFATE (PF) 4 MG/ML IV SOLN: 1.0000 mg | INTRAVENOUS | Status: DC | PRN

## 2017-04-14 MED ORDER — OXYCODONE HCL 5 MG PO TABS: 5.0000 mg | ORAL_TABLET | ORAL | Status: DC | PRN

## 2017-04-14 MED ORDER — LACTATED RINGERS IV SOLN
INTRAVENOUS | Status: DC
  Administered 2017-04-14: 07:00:00 via INTRAVENOUS

## 2017-04-14 MED ORDER — FENTANYL CITRATE (PF) 100 MCG/2ML IJ SOLN: 25.0000 ug | INTRAMUSCULAR | Status: DC | PRN

## 2017-04-14 MED ORDER — LIDOCAINE 2% (20 MG/ML) 5 ML SYRINGE
INTRAMUSCULAR | Status: DC | PRN
  Administered 2017-04-14: 100 mg via INTRAVENOUS

## 2017-04-14 MED ORDER — ACETAMINOPHEN 325 MG PO TABS
650.0000 mg | ORAL_TABLET | ORAL | Status: DC | PRN
Start: 2017-04-14 — End: 2017-04-14

## 2017-04-14 MED ORDER — IOHEXOL 300 MG/ML  SOLN
INTRAMUSCULAR | Status: DC | PRN
  Administered 2017-04-14: 10 mL via URETHRAL

## 2017-04-14 MED ORDER — FENTANYL CITRATE (PF) 100 MCG/2ML IJ SOLN
INTRAMUSCULAR | Status: DC | PRN
  Administered 2017-04-14 (×2): 50 ug via INTRAVENOUS

## 2017-04-14 MED ORDER — FENTANYL CITRATE (PF) 100 MCG/2ML IJ SOLN
INTRAMUSCULAR | Status: AC
  Filled 2017-04-14: qty 2

## 2017-04-14 SURGICAL SUPPLY — 17 items
BAG URO CATCHER STRL LF (MISCELLANEOUS) ×3 IMPLANT
BASKET ZERO TIP NITINOL 2.4FR (BASKET) ×2 IMPLANT
BSKT STON RTRVL ZERO TP 2.4FR (BASKET) ×1
CATH URET 5FR 28IN OPEN ENDED (CATHETERS) ×2 IMPLANT
CLOTH BEACON ORANGE TIMEOUT ST (SAFETY) ×3 IMPLANT
COVER FOOTSWITCH UNIV (MISCELLANEOUS) IMPLANT
COVER SURGICAL LIGHT HANDLE (MISCELLANEOUS) ×1 IMPLANT
FIBER LASER FLEXIVA 1000 (UROLOGICAL SUPPLIES) IMPLANT
FIBER LASER FLEXIVA 550 (UROLOGICAL SUPPLIES) IMPLANT
GLOVE SURG SS PI 8.0 STRL IVOR (GLOVE) IMPLANT
GOWN STRL REUS W/TWL XL LVL3 (GOWN DISPOSABLE) ×3 IMPLANT
GUIDEWIRE STR DUAL SENSOR (WIRE) ×2 IMPLANT
MANIFOLD NEPTUNE II (INSTRUMENTS) ×3 IMPLANT
PACK CYSTO (CUSTOM PROCEDURE TRAY) ×3 IMPLANT
SYRINGE IRR TOOMEY STRL 70CC (SYRINGE) IMPLANT
TUBING CONNECTING 10 (TUBING) ×1 IMPLANT
TUBING CONNECTING 10' (TUBING) ×1

## 2017-04-14 NOTE — Progress Notes (Signed)
Dr Nyoka Cowden aware of BP in pacu, and that pt did not take morning BP med Losartan.  Pt has choice to either take med before discharge or as soon as he gets home this morning. Pt states he will take med at home.

## 2017-04-14 NOTE — Transfer of Care (Signed)
Immediate Anesthesia Transfer of Care Note  Patient: Brandon Fischer  Procedure(s) Performed: CYSTOSCOPY WITH LITHOLAPAXY/RIGHT RETROGRADE PLYOGRAM/RIGHT URETEROSCOPY (Right Ureter)  Patient Location: PACU  Anesthesia Type:General  Level of Consciousness: sedated  Airway & Oxygen Therapy: Patient Spontanous Breathing and Patient connected to face mask oxygen  Post-op Assessment: Report given to RN and Post -op Vital signs reviewed and stable  Post vital signs: Reviewed and stable  Last Vitals:  Vitals Value Taken Time  BP    Temp    Pulse    Resp    SpO2      Last Pain:  Vitals:   04/14/17 0556  TempSrc: Oral  PainSc:       Patients Stated Pain Goal: 4 (14/70/92 9574)  Complications: No apparent anesthesia complications

## 2017-04-14 NOTE — Anesthesia Postprocedure Evaluation (Signed)
Anesthesia Post Note  Patient: Brandon Fischer  Procedure(s) Performed: CYSTOSCOPY WITH LITHOLAPAXY/RIGHT RETROGRADE PLYOGRAM/RIGHT URETEROSCOPY (Right Ureter)     Patient location during evaluation: PACU Anesthesia Type: General Level of consciousness: awake Pain management: pain level controlled Respiratory status: spontaneous breathing Cardiovascular status: stable Anesthetic complications: no    Last Vitals:  Vitals:   04/14/17 0915 04/14/17 0925  BP:  (!) 187/95  Pulse:  66  Resp: 17 18  Temp:  36.4 C  SpO2:  100%    Last Pain:  Vitals:   04/14/17 0925  TempSrc:   PainSc: 0-No pain                 Jaime Dome

## 2017-04-14 NOTE — Interval H&P Note (Signed)
History and Physical Interval Note:  He had some recent hematuria and right flank pain.   I am going to add a right retrograde to assess the ureter but the pain is most likely from musculoskeletal issues.   04/14/2017 7:16 AM  Brandon Fischer  has presented today for surgery, with the diagnosis of BLADDER STONE  The various methods of treatment have been discussed with the patient and family. After consideration of risks, benefits and other options for treatment, the patient has consented to  Procedure(s): CYSTOSCOPY WITH LITHOLAPAXY (N/A) as a surgical intervention .  The patient's history has been reviewed, patient examined, no change in status, stable for surgery.  I have reviewed the patient's chart and labs.  Questions were answered to the patient's satisfaction.     [name]

## 2017-04-14 NOTE — Discharge Instructions (Addendum)
CYSTOSCOPY HOME CARE INSTRUCTIONS  Activity: Rest for the remainder of the day.  Do not drive or operate equipment today.  You may resume normal activities in one to two days as instructed by your physician.   Meals: Drink plenty of liquids and eat light foods such as gelatin or soup this evening.  You may return to a normal meal plan tomorrow.  Return to Work: You may return to work in one to two days or as instructed by your physician.  Special Instructions / Symptoms: Call your physician if any of these symptoms occur:   -persistent or heavy bleeding  -bleeding which continues after first few urination  -large blood clots that are difficult to pass  -urine stream diminishes or stops completely  -fever equal to or higher than 101 degrees Farenheit.  -cloudy urine with a strong, foul odor  -severe pain  Females should always wipe from front to back after elimination.  You may feel some burning pain when you urinate.  This should disappear with time.  Applying moist heat to the lower abdomen or a hot tub bath may help relieve the pain. \  Please bring the stone to the office at f/u for analysis.   Patient Signature:  ________________________________________________________  Nurse's Signature:  ________________________________________________________

## 2017-04-14 NOTE — Op Note (Addendum)
Procedure: 1.  Cystoscopy with right retrograde pyelogram and interpretation. 2.  Removal of 1.2 cm bladder stone. 3.  Right diagnostic ureteroscopy.  Preoperative diagnosis: 1.  1.2 cm bladder stone.  2. Right flank pain with recent hematuria.  Postop diagnosis: 1.  1.2 cm bladder stone with 2 smaller stones. 2.  No right ureteral stones or obstruction.  Surgeon: Dr. [name].  Anesthesia: General.  Specimen: Stones.  Drains: None.  EBL: Minimal.  Complications: None.  Indications: Brandon Fischer is an 84 year old white male with a history of stones who was recently found to have a 1.2 cm bladder stone on CT scan he had small bilateral renal stones.  He had some increased right flank pain over the last few days and gross hematuria so right ureteroscopy was added to the procedure list after discussing the situation with the patient and his wife.  Procedure: He was given antibiotic.  A general anesthetic was induced and he was placed in lithotomy position.  He was fitted with PAS hose.  Perineum and genitalia were prepped with Betadine solution and draped in usual sterile fashion.  Cystoscopy was performed using the 23 [place] scope and 30 degree lens.  Examination revealed a normal urethra.  The external sphincter was intact.  The prostatic urethra was short with minimal lateral lobe hyperplasia but a high bladder neck that was  stiff.  Examination of bladder revealed mild to moderate trabeculation.  Ureteral orifices were unremarkable.  There was a yellowish stone at the bladder base consistent with a 1.2 cm stone seen on CT appeared most consistent with uric acid.  There were 2 smaller stones as well.  There was also a small amount of clot in the bladder.  The smaller stones in the clot were evacuated from the bladder.  A 5 French opening catheter was then passed into the right ureteral orifice.  Omnipaque was instilled.  Right retrograde pyelogram revealed normal-appearing ureter with  intrarenal collecting system with the exception of a small filling defect distally that was felt he would be a bubble or small stone.  After removal of the ureteral catheter, the distal ureter drained rapidly.  There remained some retained contrast proximally.  A 2 cm 0 tip nitinol basket was then used to grasp the large bladder stone and it was removed intact.  A sensor guidewire was then passed through the cystoscope up the ureteral orifice to the kidney and the cystoscope was removed.  The 4-1/2 French semirigid ureteroscope was then passed alongside the wire to assess the distal ureter for stone none was found and I was able to easily advance the scope into the proximal ureter.  The ureter was very generous in its diameter with no abnormalities.  The wire was then removed.  The cystoscope was reinserted and the bladder was partially drained.  The cystoscope was removed, patient was taken down from lithotomy position, his anesthetic was reversed and he was moved to recovery room in stable condition.  There were no complications.  The stones were given to the patient's family.

## 2017-04-14 NOTE — Anesthesia Preprocedure Evaluation (Addendum)
Anesthesia Evaluation  Patient identified by MRN, date of birth, ID band Patient awake    Reviewed: Allergy & Precautions, NPO status , Patient's Chart, lab work & pertinent test results  Airway Mallampati: II  TM Distance: >3 FB     Dental   Pulmonary former smoker,    breath sounds clear to auscultation       Cardiovascular hypertension, + CAD and + Peripheral Vascular Disease  + dysrhythmias  Rhythm:Regular Rate:Normal     Neuro/Psych    GI/Hepatic Neg liver ROS, GERD  ,  Endo/Other  negative endocrine ROS  Renal/GU Renal disease     Musculoskeletal   Abdominal   Peds  Hematology   Anesthesia Other Findings   Reproductive/Obstetrics                             Anesthesia Physical Anesthesia Plan  ASA: III  Anesthesia Plan: General   Post-op Pain Management:    Induction: Intravenous  PONV Risk Score and Plan: Treatment may vary due to age or medical condition  Airway Management Planned: LMA  Additional Equipment:   Intra-op Plan:   Post-operative Plan: Extubation in OR  Informed Consent:   Dental advisory given  Plan Discussed with: CRNA and Anesthesiologist  Anesthesia Plan Comments:         Anesthesia Quick Evaluation

## 2017-04-14 NOTE — Anesthesia Procedure Notes (Signed)
Procedure Name: LMA Insertion Date/Time: 04/14/2017 7:41 AM Performed by: Lind Covert, CRNA Pre-anesthesia Checklist: Patient identified, Emergency Drugs available, Suction available, Patient being monitored and Timeout performed Patient Re-evaluated:Patient Re-evaluated prior to induction Oxygen Delivery Method: Circle system utilized Preoxygenation: Pre-oxygenation with 100% oxygen Induction Type: IV induction LMA: LMA inserted LMA Size: 5.0 Number of attempts: 1 Placement Confirmation: positive ETCO2 and breath sounds checked- equal and bilateral Tube secured with: Tape Dental Injury: Teeth and Oropharynx as per pre-operative assessment

## 2017-04-15 ENCOUNTER — Encounter (HOSPITAL_COMMUNITY): Payer: Self-pay | Admitting: Urology

## 2017-04-16 DIAGNOSIS — M7061 Trochanteric bursitis, right hip: Secondary | ICD-10-CM | POA: Diagnosis not present

## 2017-04-16 DIAGNOSIS — M25551 Pain in right hip: Secondary | ICD-10-CM | POA: Diagnosis not present

## 2017-04-24 DIAGNOSIS — H349 Unspecified retinal vascular occlusion: Secondary | ICD-10-CM | POA: Diagnosis not present

## 2017-04-24 DIAGNOSIS — Z961 Presence of intraocular lens: Secondary | ICD-10-CM | POA: Diagnosis not present

## 2017-04-24 DIAGNOSIS — H52203 Unspecified astigmatism, bilateral: Secondary | ICD-10-CM | POA: Diagnosis not present

## 2017-04-27 DIAGNOSIS — I10 Essential (primary) hypertension: Secondary | ICD-10-CM | POA: Diagnosis not present

## 2017-04-27 DIAGNOSIS — R413 Other amnesia: Secondary | ICD-10-CM | POA: Diagnosis not present

## 2017-04-27 DIAGNOSIS — E7800 Pure hypercholesterolemia, unspecified: Secondary | ICD-10-CM | POA: Diagnosis not present

## 2017-04-28 DIAGNOSIS — N200 Calculus of kidney: Secondary | ICD-10-CM | POA: Diagnosis not present

## 2017-04-28 DIAGNOSIS — N210 Calculus in bladder: Secondary | ICD-10-CM | POA: Diagnosis not present

## 2017-04-29 DIAGNOSIS — N210 Calculus in bladder: Secondary | ICD-10-CM | POA: Diagnosis not present

## 2017-04-30 DIAGNOSIS — R161 Splenomegaly, not elsewhere classified: Secondary | ICD-10-CM | POA: Diagnosis not present

## 2017-04-30 DIAGNOSIS — Z0000 Encounter for general adult medical examination without abnormal findings: Secondary | ICD-10-CM | POA: Diagnosis not present

## 2017-04-30 DIAGNOSIS — I10 Essential (primary) hypertension: Secondary | ICD-10-CM | POA: Diagnosis not present

## 2017-04-30 DIAGNOSIS — M545 Low back pain: Secondary | ICD-10-CM | POA: Diagnosis not present

## 2017-04-30 DIAGNOSIS — D649 Anemia, unspecified: Secondary | ICD-10-CM | POA: Diagnosis not present

## 2017-04-30 DIAGNOSIS — E7800 Pure hypercholesterolemia, unspecified: Secondary | ICD-10-CM | POA: Diagnosis not present

## 2017-04-30 DIAGNOSIS — I440 Atrioventricular block, first degree: Secondary | ICD-10-CM | POA: Diagnosis not present

## 2017-04-30 DIAGNOSIS — I6523 Occlusion and stenosis of bilateral carotid arteries: Secondary | ICD-10-CM | POA: Diagnosis not present

## 2017-04-30 DIAGNOSIS — M1990 Unspecified osteoarthritis, unspecified site: Secondary | ICD-10-CM | POA: Diagnosis not present

## 2017-04-30 DIAGNOSIS — I471 Supraventricular tachycardia: Secondary | ICD-10-CM | POA: Diagnosis not present

## 2017-04-30 DIAGNOSIS — K219 Gastro-esophageal reflux disease without esophagitis: Secondary | ICD-10-CM | POA: Diagnosis not present

## 2017-04-30 DIAGNOSIS — Z6830 Body mass index (BMI) 30.0-30.9, adult: Secondary | ICD-10-CM | POA: Diagnosis not present

## 2017-05-07 ENCOUNTER — Ambulatory Visit (HOSPITAL_COMMUNITY)
Admission: RE | Admit: 2017-05-07 | Discharge: 2017-05-07 | Disposition: A | Discharge status: Home / Self Care | Payer: Medicare Other | Source: Ambulatory Visit | Attending: Hematology | Admitting: Hematology

## 2017-05-07 DIAGNOSIS — D72820 Lymphocytosis (symptomatic): Secondary | ICD-10-CM | POA: Diagnosis not present

## 2017-05-07 DIAGNOSIS — R161 Splenomegaly, not elsewhere classified: Secondary | ICD-10-CM | POA: Diagnosis not present

## 2017-05-07 DIAGNOSIS — D61818 Other pancytopenia: Secondary | ICD-10-CM | POA: Diagnosis not present

## 2017-05-12 DIAGNOSIS — L578 Other skin changes due to chronic exposure to nonionizing radiation: Secondary | ICD-10-CM | POA: Diagnosis not present

## 2017-05-28 DIAGNOSIS — I10 Essential (primary) hypertension: Secondary | ICD-10-CM | POA: Diagnosis not present

## 2017-05-28 DIAGNOSIS — K219 Gastro-esophageal reflux disease without esophagitis: Secondary | ICD-10-CM | POA: Diagnosis not present

## 2017-05-28 DIAGNOSIS — Z791 Long term (current) use of non-steroidal anti-inflammatories (NSAID): Secondary | ICD-10-CM | POA: Diagnosis not present

## 2017-06-10 DIAGNOSIS — N200 Calculus of kidney: Secondary | ICD-10-CM | POA: Diagnosis not present

## 2017-06-10 DIAGNOSIS — N281 Cyst of kidney, acquired: Secondary | ICD-10-CM | POA: Diagnosis not present

## 2017-06-19 DIAGNOSIS — H349 Unspecified retinal vascular occlusion: Secondary | ICD-10-CM | POA: Diagnosis not present

## 2017-06-19 DIAGNOSIS — H348312 Tributary (branch) retinal vein occlusion, right eye, stable: Secondary | ICD-10-CM | POA: Diagnosis not present

## 2017-06-23 NOTE — Progress Notes (Signed)
[name]    HEMATOLOGY/ONCOLOGY CLINIC NOTE  Date of Service: 06/24/2017  Patient Care Team: Deland Pretty, MD as PCP - General (Internal Medicine)  CHIEF COMPLAINTS/PURPOSE OF CONSULTATION:  Anemia and leucopenia  HISTORY OF PRESENTING ILLNESS:   Brandon Fischer is a wonderful 84 y.o. male  who has been referred to [place] by Dr .Deland Pretty, MD for evaluation and management of anemia and leukopenia.  Patient has a history of hypertension, dyslipidemia, atrial fibrillation, coronary artery disease, bilateral carotid artery stenosis, paroxysmal supraventricular tachycardias status post radiofrequency ablation, GERD who on recent labs with his primary care physician on 09/01/2016 which showed new anemia with a hemoglobin of 9.3 with an MCV of 82.9 and RDW of 18.5. WBC count of 2.1k with an ANC of about 700 and mild thrombocytopenia of 119k.  CMP showed bilirubin of 1.7 with an alkaline phosphatase of 154 normal AST ALT creatinine 1.9 with BUN of 24.  Patient subsequently had an ultrasound of the abdomen done on 09/09/2016 for evaluation of his abnormal liver function tests and renal insufficiency and was noted to have severe splenomegaly with this plane off 18 cm in size with a volume of 1407 mL. No focal hepatic abnormalities. No bladder distention.. 3.7 cm abdominal aortic aneurysm.  Repeat labs done in clinic today show a slight improvement in WBC count of 3.6k with an ANC of 1000. Hemoglobin 9.4 and platelets of 108k . Patient has a obvious deep palpable spleen on clinical examination. Overtly palpable lymphadenopathy noted.  Patient notes no overt weight loss fevers chills or night sweats.  No change in mental status. No chest pain no shortness of breath no significant abdominal discomfort headaches. No evidence of bleeding. No issues with recent infections. Some fatigue.  INTERVAL HISTORY  Pt presents to the office today for f/u of his pancytopenia. The patient's last visit with [place]  was on 12/24/16. He is accompanied today by his wife. The pt reports that he is doing well overall.   The pt reports that he has no new concerns and feels no different today than he did 6 months ago. He continues to have arthritis in his right hip and takes Tramadol as needed.  His Cozaar was doubled to 100mg  in April.   Of note since the patient's last visit, pt has had US Abdomen completed on 05/07/17 with results revealing Apparent decrease in size of the volume of the spleen when compared to prior ultrasound from 09/09/2016. 2. No focal splenic lesion.  Lab results today (06/24/17) of CBC, CMP, and Reticulocytes is as follows: all values are WNL except for WBC at 3.9k, HGB at 11.0, HCT at 34.0, MCH at 26.2, RDW at 17.0, Platelets at 128k, Chloride at 110, Creatinine at 1.56, GFR at 40. LDH 06/24/17 is WNL at 171  On review of systems, pt reports stable hip arthritis, good energy levels, stable weight, and denies fevers, infections, bleeding, abdominal pains, leg swelling and any other symptoms.   MEDICAL HISTORY:  Past Medical History:  Diagnosis Date  . At risk for sleep apnea    STOP-BANG= 4    SENT TO PCP 02-06-2014  . Bilateral carotid artery stenosis without cerebral infarction    ASYMPTOMATIC--  BILATERAL ICA  50-69%  PER CARDIOLOGIST NOTE, DR Einar Gip  . Bilateral carotid bruits    LEFT > RIGHT  . Coronary artery disease cardiologist-- dr Stevphen [place] CAD  per cath 08-06-2004 HX PALPITATIONS-AND -TACHYCARDIA-  . First degree heart block   .  Full dentures   . GERD (gastroesophageal reflux disease)   . Heart palpitations    Not any more  . History of atrial fibrillation without current medication    EPISODE OF AFIB WITH RVR INTRAOPERATIVELY 02-09-2014 AT [facility]--  RESOLVED AND PT FOLLOWED UP WITH CARDIOLOGIST  DR Einar Gip  . History of kidney stones   . Hyperlipidemia   . Hypertension   . Nephrolithiasis    RIGHT  . Organic impotence   . Psoriasis    Clearing up  .  PSVT (paroxysmal supraventricular tachycardia) ([place])   . Right ureteral calculus   . S/P radiofrequency ablation operation for arrhythmia-SVT 04/28/2014  . Simple renal cyst    right  . Wears glasses   . Wears hearing aid    BILATERAL  History of malignant melanoma status post Mohs surgery to remove the skin lesion on the face with skin grafting. 3 years ago.  SURGICAL HISTORY: Past Surgical History:  Procedure Laterality Date  . CARDIAC CATHETERIZATION  08-06-2004 dr Verlon Setting   (abnormal stress test) Non-obstructive CAD, pLAD 20-30%,  LCX 30-40%, pRCA 20-30%, dRCA 40%, distal LM  mild diffuse calcifation 20-30% tapering stenosis,  preserved LVSF ef 55-60%  . CARPAL TUNNEL RELEASE Bilateral 2005  . CATARACT EXTRACTION, BILATERAL Bilateral 2011  . CHOLECYSTECTOMY OPEN  1982   and NEPHROLITHOTOMY  . COLONOSCOPY W/ POLYPECTOMY  last one 04-05-2008  . CYSTOSCOPY WITH LITHOLAPAXY Right 04/14/2017   Procedure: CYSTOSCOPY WITH LITHOLAPAXY/RIGHT RETROGRADE PLYOGRAM/RIGHT URETEROSCOPY;  Surgeon: [name], MD;  Location: [facility];  Service: Urology;  Laterality: Right;  . CYSTOSCOPY WITH RETROGRADE PYELOGRAM, URETEROSCOPY AND STENT PLACEMENT Right 03/14/2014   Procedure: CYSTO/RIGHT RETROGRADE PYELOGRAM/RIGHT URETEROSCOPY/STONE EXTRACTION/STENT PLACEMENT;  Surgeon: Malka So, MD;  Location: [facility];  Service: Urology;  Laterality: Right;  . HOLMIUM LASER APPLICATION Right 01/18/1094   Procedure: RIGHT HOLMIUM LASER APPLICATION;  Surgeon: Malka So, MD;  Location: [facility];  Service: Urology;  Laterality: Right;  . KNEE ARTHROSCOPY W/ DEBRIDEMENT Right 11-23-2002   and Synovectomy  . NEPHROLITHOTOMY  01/16/2011   Procedure: NEPHROLITHOTOMY PERCUTANEOUS;  Surgeon: Malka So;  Location: [facility];  Service: Urology;  Laterality: Left;  C-Arm  Holmium Laser  . NEPHROLITHOTOMY  1981  . SUPRAVENTRICULAR TACHYCARDIA ABLATION N/A 04/28/2014   Procedure: SUPRAVENTRICULAR  TACHYCARDIA ABLATION;  Surgeon: Evans Lance, MD;  Location: [facility] C[facility] LAB;  Service: Cardiovascular;  Laterality: N/A;    SOCIAL HISTORY: Social History   Socioeconomic History  . Marital status: Married    Spouse name: Not on file  . Number of children: 2  . Years of education: Not on file  . Highest education level: Not on file  Occupational History  . Occupation: Actor     Comment: retired  Scientific laboratory technician  . Financial resource strain: Not on file  . Food insecurity:    Worry: Not on file    Inability: Not on file  . Transportation needs:    Medical: Not on file    Non-medical: Not on file  Tobacco Use  . Smoking status: Former Smoker    Packs/day: 1.00    Years: 40.00    Pack years: 40.00    Types: Cigarettes    Last attempt to quit: 02/06/1998    Years since quitting: 19.3  . Smokeless tobacco: Never Used  Substance and Sexual Activity  . Alcohol use: No  . Drug use: No  . Sexual activity: Not on file  Lifestyle  .  Physical activity:    Days per week: Not on file    Minutes per session: Not on file  . Stress: Not on file  Relationships  . Social connections:    Talks on phone: Not on file    Gets together: Not on file    Attends religious service: Not on file    Active member of club or organization: Not on file    Attends meetings of clubs or organizations: Not on file    Relationship status: Not on file  . Intimate partner violence:    Fear of current or ex partner: Not on file    Emotionally abused: Not on file    Physically abused: Not on file    Forced sexual activity: Not on file  Other Topics Concern  . Not on file  Social History Narrative  . Not on file    FAMILY HISTORY: Family History  Problem Relation Age of Onset  . Cancer Mother        Ovarian or colon  . Heart attack Father   . Heart attack Brother     ALLERGIES:  has No Known Allergies.  MEDICATIONS:  Current Outpatient Medications  Medication Sig Dispense Refill  .  acetaminophen (TYLENOL) 500 MG tablet Take 1,000 mg by mouth every 6 (six) hours as needed (for pain).    [name] aspirin 81 MG tablet Take 81 mg by mouth every other day. IN THE MORNING    . atorvastatin (LIPITOR) 20 MG tablet Take 20 mg by mouth every evening.     . cyclobenzaprine (FLEXERIL) 5 MG tablet Take 5 mg by mouth 3 (three) times daily as needed for muscle spasms.    . diphenhydramine-acetaminophen (TYLENOL PM) 25-500 MG TABS Take 2 tablets by mouth at bedtime.    [name] losartan (COZAAR) 50 MG tablet Take 50 mg by mouth daily.    . pantoprazole (PROTONIX) 40 MG tablet Take 40 mg by mouth daily at 2 PM.     . traMADol (ULTRAM) 50 MG tablet Take 50 mg by mouth every 6 (six) hours as needed for moderate pain.      No current facility-administered medications for this visit.     REVIEW OF SYSTEMS:    A 10+ POINT REVIEW OF SYSTEMS WAS OBTAINED including neurology, dermatology, psychiatry, cardiac, respiratory, lymph, extremities, GI, GU, Musculoskeletal, constitutional, breasts, reproductive, HEENT.  All pertinent positives are noted in the HPI.  All others are negative.   PHYSICAL EXAMINATION:  ECOG PERFORMANCE STATUS: 2 - Symptomatic, <50% confined to bed  . Vitals:   06/24/17 1213  BP: (!) 141/59  Pulse: (!) 50  Resp: 18  Temp: 98.4 F (36.9 C)  SpO2: 100%   Filed Weights   06/24/17 1213  Weight: 198 lb 9.6 oz (90.1 kg)   .Body mass index is 27.7 kg/m.  GENERAL:alert, in no acute distress and comfortable SKIN: no acute rashes, no significant lesions EYES: conjunctiva are pink and non-injected, sclera anicteric OROPHARYNX: MMM, no exudates, no oropharyngeal erythema or ulceration NECK: supple, no JVD LYMPH:  no palpable lymphadenopathy in the cervical, axillary or inguinal regions LUNGS: clear to auscultation b/l with normal respiratory effort HEART: regular rate & rhythm ABDOMEN:  normoactive bowel sounds , non tender, palpable splenomegaly Extremity: no pedal  edema PSYCH: alert & oriented x 3 with fluent speech NEURO: no focal motor/sensory deficits   LABORATORY DATA:  I have reviewed the data as listed  . CBC Latest Ref Rng & Units 06/24/2017  04/09/2017 03/31/2017  WBC 4.0 - 10.3 K/uL 3.9(L) 4.2 -  Hemoglobin 13.0 - 17.1 g/dL 11.0(L) 11.7(L) 11.6(L)  Hematocrit 38.4 - 49.9 % 34.0(L) 36.6(L) 34.0(L)  Platelets 140 - 400 K/uL 128(L) 123(L) -   ANC 1800  CBC    Component Value Date/Time   WBC 3.9 (L) 06/24/2017 1112   RBC 4.20 06/24/2017 1112   RBC 4.20 06/24/2017 1112   HGB 11.0 (L) 06/24/2017 1112   HGB 12.0 (L) 12/24/2016 1113   HCT 34.0 (L) 06/24/2017 1112   HCT 36.2 (L) 12/24/2016 1113   PLT 128 (L) 06/24/2017 1112   PLT 123 (L) 12/24/2016 1113   MCV 81.0 06/24/2017 1112   MCV 79.7 12/24/2016 1113   MCH 26.2 (L) 06/24/2017 1112   MCHC 32.4 06/24/2017 1112   RDW 17.0 (H) 06/24/2017 1112   RDW 16.2 (H) 12/24/2016 1113   LYMPHSABS 1.0 06/24/2017 1112   LYMPHSABS 0.7 (L) 12/24/2016 1113   MONOABS 0.2 06/24/2017 1112   MONOABS 0.4 12/24/2016 1113   EOSABS 0.0 06/24/2017 1112   EOSABS 0.0 12/24/2016 1113   BASOSABS 0.0 06/24/2017 1112   BASOSABS 0.0 12/24/2016 1113     . CMP Latest Ref Rng & Units 06/24/2017 04/09/2017 03/31/2017  Glucose 70 - 140 mg/dL 115 104(H) 104(H)  BUN 7 - 26 mg/dL 23 18 11   Creatinine 0.70 - 1.30 mg/dL 1.56(H) 1.26(H) 1.10  Sodium 136 - 145 mmol/L 142 142 141  Potassium 3.5 - 5.1 mmol/L 4.6 4.5 4.0  Chloride 98 - 109 mmol/L 110(H) 107 105  CO2 22 - 29 mmol/L 24 26 -  Calcium 8.4 - 10.4 mg/dL 9.2 9.0 -  Total Protein 6.4 - 8.3 g/dL 6.4 - 6.4(L)  Total Bilirubin 0.2 - 1.2 mg/dL 1.0 - 1.4(H)  Alkaline Phos 40 - 150 U/L 113 - 101  AST 5 - 34 U/L 15 - 19  ALT 0 - 55 U/L 11 - 14(L)   . Lab Results  Component Value Date   LDH 171 06/24/2017     Component     Latest Ref Rng & Units 09/09/2016  IgG (Immunoglobin G), Serum     700 - 1,600 mg/dL 917  IgA/Immunoglobulin A, Serum     61 - 437  mg/dL 203  IgM, Qn, Serum     15 - 143 mg/dL 43  Total Protein     6.0 - 8.5 g/dL 6.1  Albumin SerPl Elph-Mcnc     2.9 - 4.4 g/dL 3.7  Alpha 1     0.0 - 0.4 g/dL 0.3  Alpha2 Glob SerPl Elph-Mcnc     0.4 - 1.0 g/dL 0.5  B-Globulin SerPl Elph-Mcnc     0.7 - 1.3 g/dL 0.8  Gamma Glob SerPl Elph-Mcnc     0.4 - 1.8 g/dL 0.7  M Protein SerPl Elph-Mcnc     Not Observed g/dL Not Observed  Globulin, Total     2.2 - 3.9 g/dL 2.4  Albumin/Glob SerPl     0.7 - 1.7 1.6  IFE 1      Comment  Please Note (HCV):      Comment  Iron     42 - 163 ug/dL 64  TIBC     202 - 409 ug/dL 194 (L)  UIBC     117 - 376 ug/dL 130  %SAT     20 - 55 % 33  Folate, Hemolysate     Not Estab. ng/mL 369.8  HCT     37.5 -  51.0 % 28.0 (L)  Folate, RBC     >498 ng/mL 1,321  Ig Kappa Free Light Chain     3.3 - 19.4 mg/L 21.4 (H)  Ig Lambda Free Light Chain     5.7 - 26.3 mg/L 36.6 (H)  Kappa/Lambda FluidC Ratio     0.26 - 1.65 0.58  LDH     125 - 245 U/L 326 (H)  Sed Rate     0 - 30 mm/hr 2  Vitamin B12     232 - 1245 pg/mL 1,261 (H)  TSH     0.320 - 4.118 m(IU)/L 2.609  Hep C Virus Ab     0.0 - 0.9 s/co ratio 0.1  Ferritin     22 - 316 ng/ml 399 (H)   Component     Latest Ref Rng & Units 09/17/2016 09/18/2016  Prothrombin Time     11.4 - 15.2 seconds  14.2  INR       1.11  LDH     125 - 245 U/L 309 (H)   Haptoglobin     34 - 200 mg/dL <10 (L)   Coombs', Direct     Negative Negative   APTT     24 - 36 seconds  32   Component     Latest Ref Rng & Units 09/30/2016  RA Latex Turbid.     0.0 - 13.9 IU/mL <10.0  CCP Antibodies IgG/IgA     0 - 19 units 12  ANA Ab, IFA      Negative  Sed Rate     0 - 30 mm/hr 2   Component     Latest Ref Rng & Units 09/30/2016 09/30/2016 09/30/2016 10/22/2016         3:23 PM  3:23 PM  3:23 PM   Interpretation       Comment    Comment:       Comment Comment   Specimen:       Peripheral Blood    Submitted Dx:       Comment    Viability:       97%     Cell Population       Comment    Granulocytes:       Comment    Monocytes:       Comment    Antibodies Performed:       Comment    Director Review       Comment    RA Latex Turbid.     0.0 - 13.9 IU/mL <10.0     CCP Antibodies IgG/IgA     0 - 19 units 12     ANA Ab, IFA      Negative     LDH     125 - 245 U/L    185           RADIOGRAPHIC STUDIES: I have personally reviewed the radiological images as listed and agreed with the findings in the report. No results found.  ASSESSMENT & PLAN:   #1 Pancytopenia with normocytic Anemia, neutropenia and thrombocytopenia This could certainly be from the patient's splenomegaly causing hypersplenism. Etiology of the splenomegaly is unclear.  Bone marrow showed no overt evidence of MDS/Myelofibrosis no evidence of acute leukemia Increased CD8+ T cell population ? Reactive vs clonal. Normal cytogenetics. Sed rate WNL neg ANA and Rheumatoid panel  No evidence of monoclonal paraproteinemia. TSH levels within normal limits B12, folate and iron levels within normal limits.  Hepatitis C negative  Given improvement in counts - the T cell population likely reactive and could be related to a viral infection that appears to be resolving now. -WBC at 3.8K as of today (12/24/16), improved from 2.8K 2 months ago. Hemoglobin at 12 as of today (12/24/16), improved from 11.9 2 months ago. Platelets at 123K improved from 110K 2 months ago.   #2 Elevated LDH With mild elevated bilirubin cannot r/o some hemolysis though that would not explain leucopenia/neutropenia or thrombocytopenia and splenomegaly by itself LDH slightly lower. Haptoglobin <10 --- suggestive of some hemolysis. Coombs neg.  LDH levels have now normalized  PLAN  -Discussed pt labwork today, 06/24/17; LDH normal at 171. Blood chemistries show creatinine increased from 1.26 to 1.56. Mild leukopenia with WBC at 3.9k, Hgb at 11.0. -Advise that PCP continue to watch kidney  counts while on Cozaar and Protonix -Reviewed 05/07/17 US Abdomen which revealed decreased spleen size to about 900 cubic cm, and explains mild thrombocytopenia with PLT at 128k -Will continue to monitor counts every 6 months  -Would recommend repeat kidney functions test in a month or two with PCP -Will see pt back in 6 months  -No interventions/treatments recommended at this time   RTC with Dr Irene Limbo in 6 months with labs    Orders Placed This Encounter  Procedures  . CBC with Differential/Platelet    Standing Status:   Future    Standing Expiration Date:   07/29/2018  . CMP ([place] only)    Standing Status:   Future    Standing Expiration Date:   06/25/2018  . Lactate dehydrogenase    Standing Status:   Future    Standing Expiration Date:   06/25/2018    All of the patients questions were answered with apparent satisfaction. The patient knows to call the clinic with any problems, questions or concerns.  The total time spent in the appt was 20 minutes and more than 50% was on counseling and direct patient cares.   Sullivan Lone MD [place] [facility]IV[facility] Hematology/Oncology Physician [facility]  (Office):       [phone] (Work cell):  [phone] (Fax):           [phone]  I, Baldwin [place], am acting as a scribe for Dr Irene Limbo.   .I have reviewed the above documentation for accuracy and completeness, and I agree with the above. Brunetta Genera MD

## 2017-06-24 ENCOUNTER — Inpatient Hospital Stay: Payer: Medicare Other | Attending: Hematology | Admitting: Hematology

## 2017-06-24 ENCOUNTER — Telehealth: Payer: Self-pay | Admitting: Hematology

## 2017-06-24 ENCOUNTER — Inpatient Hospital Stay: Payer: Medicare Other

## 2017-06-24 ENCOUNTER — Encounter: Payer: Self-pay | Admitting: Hematology

## 2017-06-24 VITALS — BP 141/59 | HR 50 | Temp 98.4°F | Resp 18 | Ht 71.0 in | Wt 198.6 lb

## 2017-06-24 DIAGNOSIS — D72820 Lymphocytosis (symptomatic): Secondary | ICD-10-CM

## 2017-06-24 DIAGNOSIS — D709 Neutropenia, unspecified: Secondary | ICD-10-CM | POA: Diagnosis not present

## 2017-06-24 DIAGNOSIS — D61818 Other pancytopenia: Secondary | ICD-10-CM

## 2017-06-24 DIAGNOSIS — M13851 Other specified arthritis, right hip: Secondary | ICD-10-CM | POA: Diagnosis not present

## 2017-06-24 DIAGNOSIS — D649 Anemia, unspecified: Secondary | ICD-10-CM

## 2017-06-24 DIAGNOSIS — D696 Thrombocytopenia, unspecified: Secondary | ICD-10-CM | POA: Insufficient documentation

## 2017-06-24 DIAGNOSIS — R161 Splenomegaly, not elsewhere classified: Secondary | ICD-10-CM | POA: Insufficient documentation

## 2017-06-24 DIAGNOSIS — Z87891 Personal history of nicotine dependence: Secondary | ICD-10-CM | POA: Insufficient documentation

## 2017-06-24 LAB — LACTATE DEHYDROGENASE: LDH: 171 U/L (ref 125–245)

## 2017-06-24 LAB — RETICULOCYTES
RBC.: 4.2 MIL/uL (ref 4.20–5.82)
RETIC COUNT ABSOLUTE: 54.6 10*3/uL (ref 34.8–93.9)
RETIC CT PCT: 1.3 % (ref 0.8–1.8)

## 2017-06-24 LAB — CBC WITH DIFFERENTIAL/PLATELET
BASOS ABS: 0 10*3/uL (ref 0.0–0.1)
BASOS PCT: 0 %
EOS ABS: 0 10*3/uL (ref 0.0–0.5)
EOS PCT: 1 %
HCT: 34 % — ABNORMAL LOW (ref 38.4–49.9)
Hemoglobin: 11 g/dL — ABNORMAL LOW (ref 13.0–17.1)
LYMPHS PCT: 25 %
Lymphs Abs: 1 10*3/uL (ref 0.9–3.3)
MCH: 26.2 pg — ABNORMAL LOW (ref 27.2–33.4)
MCHC: 32.4 g/dL (ref 32.0–36.0)
MCV: 81 fL (ref 79.3–98.0)
MONO ABS: 0.2 10*3/uL (ref 0.1–0.9)
Monocytes Relative: 5 %
Neutro Abs: 2.7 10*3/uL (ref 1.5–6.5)
Neutrophils Relative %: 69 %
PLATELETS: 128 10*3/uL — AB (ref 140–400)
RBC: 4.2 MIL/uL (ref 4.20–5.82)
RDW: 17 % — AB (ref 11.0–14.6)
WBC: 3.9 10*3/uL — ABNORMAL LOW (ref 4.0–10.3)

## 2017-06-24 LAB — COMPREHENSIVE METABOLIC PANEL
ALBUMIN: 4.2 g/dL (ref 3.5–5.0)
ALK PHOS: 113 U/L (ref 40–150)
ALT: 11 U/L (ref 0–55)
AST: 15 U/L (ref 5–34)
Anion gap: 8 (ref 3–11)
BILIRUBIN TOTAL: 1 mg/dL (ref 0.2–1.2)
BUN: 23 mg/dL (ref 7–26)
CALCIUM: 9.2 mg/dL (ref 8.4–10.4)
CO2: 24 mmol/L (ref 22–29)
CREATININE: 1.56 mg/dL — AB (ref 0.70–1.30)
Chloride: 110 mmol/L — ABNORMAL HIGH (ref 98–109)
GFR calc Af Amer: 46 mL/min — ABNORMAL LOW (ref >60)
GFR, EST NON AFRICAN AMERICAN: 40 mL/min — AB (ref >60)
Glucose, Bld: 115 mg/dL (ref 70–140)
Potassium: 4.6 mmol/L (ref 3.5–5.1)
Sodium: 142 mmol/L (ref 136–145)
TOTAL PROTEIN: 6.4 g/dL (ref 6.4–8.3)

## 2017-06-24 NOTE — Telephone Encounter (Signed)
Appointments scheduled AVS/Calendar printed per 6/12 los °

## 2017-07-17 DIAGNOSIS — M7061 Trochanteric bursitis, right hip: Secondary | ICD-10-CM | POA: Diagnosis not present

## 2017-07-17 DIAGNOSIS — M25551 Pain in right hip: Secondary | ICD-10-CM | POA: Diagnosis not present

## 2017-09-10 DIAGNOSIS — M25551 Pain in right hip: Secondary | ICD-10-CM | POA: Diagnosis not present

## 2017-09-10 DIAGNOSIS — M7061 Trochanteric bursitis, right hip: Secondary | ICD-10-CM | POA: Diagnosis not present

## 2017-09-11 DIAGNOSIS — D1801 Hemangioma of skin and subcutaneous tissue: Secondary | ICD-10-CM | POA: Diagnosis not present

## 2017-09-11 DIAGNOSIS — L578 Other skin changes due to chronic exposure to nonionizing radiation: Secondary | ICD-10-CM | POA: Diagnosis not present

## 2017-09-11 DIAGNOSIS — D229 Melanocytic nevi, unspecified: Secondary | ICD-10-CM | POA: Diagnosis not present

## 2017-09-11 DIAGNOSIS — Z85820 Personal history of malignant melanoma of skin: Secondary | ICD-10-CM | POA: Diagnosis not present

## 2017-09-11 DIAGNOSIS — L738 Other specified follicular disorders: Secondary | ICD-10-CM | POA: Diagnosis not present

## 2017-09-11 DIAGNOSIS — L814 Other melanin hyperpigmentation: Secondary | ICD-10-CM | POA: Diagnosis not present

## 2017-09-11 DIAGNOSIS — L821 Other seborrheic keratosis: Secondary | ICD-10-CM | POA: Diagnosis not present

## 2017-09-21 ENCOUNTER — Other Ambulatory Visit: Payer: Self-pay | Admitting: Internal Medicine

## 2017-09-22 ENCOUNTER — Other Ambulatory Visit: Payer: Self-pay | Admitting: Internal Medicine

## 2017-09-22 DIAGNOSIS — R161 Splenomegaly, not elsewhere classified: Secondary | ICD-10-CM

## 2017-09-22 DIAGNOSIS — M25551 Pain in right hip: Secondary | ICD-10-CM | POA: Diagnosis not present

## 2017-09-30 DIAGNOSIS — M25551 Pain in right hip: Secondary | ICD-10-CM | POA: Diagnosis not present

## 2017-10-02 ENCOUNTER — Ambulatory Visit
Admission: RE | Admit: 2017-10-02 | Discharge: 2017-10-02 | Disposition: A | Discharge status: Home / Self Care | Payer: Medicare Other | Source: Ambulatory Visit | Attending: Internal Medicine | Admitting: Internal Medicine

## 2017-10-02 DIAGNOSIS — R161 Splenomegaly, not elsewhere classified: Secondary | ICD-10-CM | POA: Diagnosis not present

## 2017-10-09 DIAGNOSIS — Z23 Encounter for immunization: Secondary | ICD-10-CM | POA: Diagnosis not present

## 2017-10-28 DIAGNOSIS — H903 Sensorineural hearing loss, bilateral: Secondary | ICD-10-CM | POA: Diagnosis not present

## 2017-11-11 DIAGNOSIS — H6123 Impacted cerumen, bilateral: Secondary | ICD-10-CM | POA: Diagnosis not present

## 2017-11-17 DIAGNOSIS — H6123 Impacted cerumen, bilateral: Secondary | ICD-10-CM | POA: Diagnosis not present

## 2017-11-25 DIAGNOSIS — M25551 Pain in right hip: Secondary | ICD-10-CM | POA: Diagnosis not present

## 2017-11-25 DIAGNOSIS — S39012A Strain of muscle, fascia and tendon of lower back, initial encounter: Secondary | ICD-10-CM | POA: Diagnosis not present

## 2017-11-25 DIAGNOSIS — M5136 Other intervertebral disc degeneration, lumbar region: Secondary | ICD-10-CM | POA: Diagnosis not present

## 2017-11-25 DIAGNOSIS — M7061 Trochanteric bursitis, right hip: Secondary | ICD-10-CM | POA: Diagnosis not present

## 2017-12-03 DIAGNOSIS — M545 Low back pain: Secondary | ICD-10-CM | POA: Diagnosis not present

## 2017-12-09 DIAGNOSIS — M5136 Other intervertebral disc degeneration, lumbar region: Secondary | ICD-10-CM | POA: Diagnosis not present

## 2017-12-09 DIAGNOSIS — M4316 Spondylolisthesis, lumbar region: Secondary | ICD-10-CM | POA: Diagnosis not present

## 2017-12-09 DIAGNOSIS — M48061 Spinal stenosis, lumbar region without neurogenic claudication: Secondary | ICD-10-CM | POA: Diagnosis not present

## 2017-12-09 DIAGNOSIS — M545 Low back pain: Secondary | ICD-10-CM | POA: Diagnosis not present

## 2017-12-23 NOTE — Progress Notes (Signed)
[name]    HEMATOLOGY/ONCOLOGY CLINIC NOTE  Date of Service: 12/24/2017  Patient Care Team: Deland Pretty, MD as PCP - General (Internal Medicine)  CHIEF COMPLAINTS/PURPOSE OF CONSULTATION:  Anemia and leucopenia  HISTORY OF PRESENTING ILLNESS:   Brandon Fischer is a wonderful 84 y.o. male  who has been referred to [place] by Dr .Deland Pretty, MD for evaluation and management of anemia and leukopenia.  Patient has a history of hypertension, dyslipidemia, atrial fibrillation, coronary artery disease, bilateral carotid artery stenosis, paroxysmal supraventricular tachycardias status post radiofrequency ablation, GERD who on recent labs with his primary care physician on 09/01/2016 which showed new anemia with a hemoglobin of 9.3 with an MCV of 82.9 and RDW of 18.5. WBC count of 2.1k with an ANC of about 700 and mild thrombocytopenia of 119k.  CMP showed bilirubin of 1.7 with an alkaline phosphatase of 154 normal AST ALT creatinine 1.9 with BUN of 24.  Patient subsequently had an ultrasound of the abdomen done on 09/09/2016 for evaluation of his abnormal liver function tests and renal insufficiency and was noted to have severe splenomegaly with this plane off 18 cm in size with a volume of 1407 mL. No focal hepatic abnormalities. No bladder distention.. 3.7 cm abdominal aortic aneurysm.  Repeat labs done in clinic today show a slight improvement in WBC count of 3.6k with an ANC of 1000. Hemoglobin 9.4 and platelets of 108k . Patient has a obvious deep palpable spleen on clinical examination. Overtly palpable lymphadenopathy noted.  Patient notes no overt weight loss fevers chills or night sweats.  No change in mental status. No chest pain no shortness of breath no significant abdominal discomfort headaches. No evidence of bleeding. No issues with recent infections. Some fatigue.  INTERVAL HISTORY  Brandon Fischer returns today for management and evaluation of his pancytopenia. The patient's  last visit with [place] was on 06/24/17. He is accompanied today by his wife. The pt reports that he is doing well overall.   The pt reports that he has not developed any new concerns in the last 6 months. He notes that he has been following with an orthopedist for his lumbar spinal stenosis causing back pain, and notes that he had an MRI and is planning to receive an epidural injection next week.   The pt denies any fevers, chills, night sweats, unexpected weight loss, or concerns of recent infections. The pt denies feeling any differently now as compared to 6 months ago. His wife notes that she has noticed that he has felt more sleepy recently, and the pt notes that he is not sleeping as well as he used to because he wakes up 2-3 times a night due to his back pain.   The pt notes that he takes Tylenol and Tramadol for his back pain, but his medication list has not changed in the last 6 months. He takes a multivitamin each day. He continues on Protonix.   Of note since the patient's last visit, pt has had a US Abdomen completed on 10/02/17 with results revealing Status post cholecystectomy. Bilateral simple renal cysts are noted. Mild splenomegaly is noted with calculated volume of 630 cubic cm, which is decreased compared to prior exam.  Lab results today (12/24/17) of CBC w/diff and CMP is as follows: all values are WNL except for WBC at 2.8k, RBC at 3.87, HGB at 10.1, HCT at 31.5, RDW at 16.7, PLT at 122k, ANC at 1.2k, Glucose at 110, Creatinine at 1.65, Total Protein  at 6.4, Total Bilirubin at 1.5, GFR at 38.  On review of systems, pt reports eating well, stable back pain, not sleeping well, stable weight, and denies fevers, chills, night sweats, unexpected weight loss, recent infections, new back pain, abdominal pains, and any other symptoms.   MEDICAL HISTORY:  Past Medical History:  Diagnosis Date  . At risk for sleep apnea    STOP-BANG= 4    SENT TO PCP 02-06-2014  . Bilateral carotid artery  stenosis without cerebral infarction    ASYMPTOMATIC--  BILATERAL ICA  50-69%  PER CARDIOLOGIST NOTE, DR Einar Gip  . Bilateral carotid bruits    LEFT > RIGHT  . Coronary artery disease cardiologist-- dr Stevphen [place] CAD  per cath 08-06-2004 HX PALPITATIONS-AND -TACHYCARDIA-  . First degree heart block   . Full dentures   . GERD (gastroesophageal reflux disease)   . Heart palpitations    Not any more  . History of atrial fibrillation without current medication    EPISODE OF AFIB WITH RVR INTRAOPERATIVELY 02-09-2014 AT [facility]--  RESOLVED AND PT FOLLOWED UP WITH CARDIOLOGIST  DR Einar Gip  . History of kidney stones   . Hyperlipidemia   . Hypertension   . Nephrolithiasis    RIGHT  . Organic impotence   . Psoriasis    Clearing up  . PSVT (paroxysmal supraventricular tachycardia) ([place])   . Right ureteral calculus   . S/P radiofrequency ablation operation for arrhythmia-SVT 04/28/2014  . Simple renal cyst    right  . Wears glasses   . Wears hearing aid    BILATERAL  History of malignant melanoma status post Mohs surgery to remove the skin lesion on the face with skin grafting. 3 years ago.  SURGICAL HISTORY: Past Surgical History:  Procedure Laterality Date  . CARDIAC CATHETERIZATION  08-06-2004 dr Verlon Setting   (abnormal stress test) Non-obstructive CAD, pLAD 20-30%,  LCX 30-40%, pRCA 20-30%, dRCA 40%, distal LM  mild diffuse calcifation 20-30% tapering stenosis,  preserved LVSF ef 55-60%  . CARPAL TUNNEL RELEASE Bilateral 2005  . CATARACT EXTRACTION, BILATERAL Bilateral 2011  . CHOLECYSTECTOMY OPEN  1982   and NEPHROLITHOTOMY  . COLONOSCOPY W/ POLYPECTOMY  last one 04-05-2008  . CYSTOSCOPY WITH LITHOLAPAXY Right 04/14/2017   Procedure: CYSTOSCOPY WITH LITHOLAPAXY/RIGHT RETROGRADE PLYOGRAM/RIGHT URETEROSCOPY;  Surgeon: [name], MD;  Location: [facility];  Service: Urology;  Laterality: Right;  . CYSTOSCOPY WITH RETROGRADE PYELOGRAM, URETEROSCOPY AND STENT PLACEMENT Right 03/14/2014    Procedure: CYSTO/RIGHT RETROGRADE PYELOGRAM/RIGHT URETEROSCOPY/STONE EXTRACTION/STENT PLACEMENT;  Surgeon: Malka So, MD;  Location: [facility];  Service: Urology;  Laterality: Right;  . HOLMIUM LASER APPLICATION Right 4/0/9811   Procedure: RIGHT HOLMIUM LASER APPLICATION;  Surgeon: Malka So, MD;  Location: [facility];  Service: Urology;  Laterality: Right;  . KNEE ARTHROSCOPY W/ DEBRIDEMENT Right 11-23-2002   and Synovectomy  . NEPHROLITHOTOMY  01/16/2011   Procedure: NEPHROLITHOTOMY PERCUTANEOUS;  Surgeon: Malka So;  Location: [facility];  Service: Urology;  Laterality: Left;  C-Arm  Holmium Laser  . NEPHROLITHOTOMY  1981  . SUPRAVENTRICULAR TACHYCARDIA ABLATION N/A 04/28/2014   Procedure: SUPRAVENTRICULAR TACHYCARDIA ABLATION;  Surgeon: Evans Lance, MD;  Location: [facility];  Service: Cardiovascular;  Laterality: N/A;    SOCIAL HISTORY: Social History   Socioeconomic History  . Marital status: Married    Spouse name: Not on file  . Number of children: 2  . Years of education: Not on file  . Highest education level:  Not on file  Occupational History  . Occupation: Actor     Comment: retired  Scientific laboratory technician  . Financial resource strain: Not on file  . Food insecurity:    Worry: Not on file    Inability: Not on file  . Transportation needs:    Medical: Not on file    Non-medical: Not on file  Tobacco Use  . Smoking status: Former Smoker    Packs/day: 1.00    Years: 40.00    Pack years: 40.00    Types: Cigarettes    Last attempt to quit: 02/06/1998    Years since quitting: 19.8  . Smokeless tobacco: Never Used  Substance and Sexual Activity  . Alcohol use: No  . Drug use: No  . Sexual activity: Not on file  Lifestyle  . Physical activity:    Days per week: Not on file    Minutes per session: Not on file  . Stress: Not on file  Relationships  . Social connections:    Talks on phone: Not on file    Gets together: Not on  file    Attends religious service: Not on file    Active member of club or organization: Not on file    Attends meetings of clubs or organizations: Not on file    Relationship status: Not on file  . Intimate partner violence:    Fear of current or ex partner: Not on file    Emotionally abused: Not on file    Physically abused: Not on file    Forced sexual activity: Not on file  Other Topics Concern  . Not on file  Social History Narrative  . Not on file    FAMILY HISTORY: Family History  Problem Relation Age of Onset  . Cancer Mother        Ovarian or colon  . Heart attack Father   . Heart attack Brother     ALLERGIES:  has No Known Allergies.  MEDICATIONS:  Current Outpatient Medications  Medication Sig Dispense Refill  . acetaminophen (TYLENOL) 500 MG tablet Take 1,000 mg by mouth every 6 (six) hours as needed (for pain).    [name] aspirin 81 MG tablet Take 81 mg by mouth every other day. IN THE MORNING    . atorvastatin (LIPITOR) 20 MG tablet Take 20 mg by mouth every evening.     . cyclobenzaprine (FLEXERIL) 5 MG tablet Take 5 mg by mouth 3 (three) times daily as needed for muscle spasms.    . diphenhydramine-acetaminophen (TYLENOL PM) 25-500 MG TABS Take 2 tablets by mouth at bedtime.    [name] losartan (COZAAR) 50 MG tablet Take 50 mg by mouth daily.    . pantoprazole (PROTONIX) 40 MG tablet Take 40 mg by mouth daily at 2 PM.     . traMADol (ULTRAM) 50 MG tablet Take 50 mg by mouth every 6 (six) hours as needed for moderate pain.      No current facility-administered medications for this visit.     REVIEW OF SYSTEMS:    A 10+ POINT REVIEW OF SYSTEMS WAS OBTAINED including neurology, dermatology, psychiatry, cardiac, respiratory, lymph, extremities, GI, GU, Musculoskeletal, constitutional, breasts, reproductive, HEENT.  All pertinent positives are noted in the HPI.  All others are negative.   PHYSICAL EXAMINATION:  ECOG PERFORMANCE STATUS: 2 - Symptomatic, <50% confined to  bed  . Vitals:   12/24/17 1038  BP: (!) 151/63  Pulse: 77  Resp: 20  Temp: 98 F (  36.7 C)  SpO2: 100%   Filed Weights   12/24/17 1038  Weight: 204 lb (92.5 kg)   .Body mass index is 28.45 kg/m.  GENERAL:alert, in no acute distress and comfortable SKIN: no acute rashes, no significant lesions EYES: conjunctiva are pink and non-injected, sclera anicteric OROPHARYNX: MMM, no exudates, no oropharyngeal erythema or ulceration NECK: supple, no JVD LYMPH:  no palpable lymphadenopathy in the cervical, axillary or inguinal regions LUNGS: clear to auscultation b/l with normal respiratory effort HEART: regular rate & rhythm ABDOMEN:  normoactive bowel sounds , non tender, not distended. No palpable hepatosplenomegaly.  Extremity: no pedal edema PSYCH: alert & oriented x 3 with fluent speech NEURO: no focal motor/sensory deficits   LABORATORY DATA:  I have reviewed the data as listed  . CBC Latest Ref Rng & Units 12/24/2017 06/24/2017 04/09/2017  WBC 4.0 - 10.5 K/uL 2.8(L) 3.9(L) 4.2  Hemoglobin 13.0 - 17.0 g/dL 10.1(L) 11.0(L) 11.7(L)  Hematocrit 39.0 - 52.0 % 31.5(L) 34.0(L) 36.6(L)  Platelets 150 - 400 K/uL 122(L) 128(L) 123(L)   ANC 1800  CBC    Component Value Date/Time   WBC 2.8 (L) 12/24/2017 0947   RBC 3.87 (L) 12/24/2017 0947   HGB 10.1 (L) 12/24/2017 0947   HGB 12.0 (L) 12/24/2016 1113   HCT 31.5 (L) 12/24/2017 0947   HCT 36.2 (L) 12/24/2016 1113   PLT 122 (L) 12/24/2017 0947   PLT 123 (L) 12/24/2016 1113   MCV 81.4 12/24/2017 0947   MCV 79.7 12/24/2016 1113   MCH 26.1 12/24/2017 0947   MCHC 32.1 12/24/2017 0947   RDW 16.7 (H) 12/24/2017 0947   RDW 16.2 (H) 12/24/2016 1113   LYMPHSABS 1.3 12/24/2017 0947   LYMPHSABS 0.7 (L) 12/24/2016 1113   MONOABS 0.3 12/24/2017 0947   MONOABS 0.4 12/24/2016 1113   EOSABS 0.0 12/24/2017 0947   EOSABS 0.0 12/24/2016 1113   BASOSABS 0.0 12/24/2017 0947   BASOSABS 0.0 12/24/2016 1113     . CMP Latest Ref Rng & Units  12/24/2017 06/24/2017 04/09/2017  Glucose 70 - 99 mg/dL 110(H) 115 104(H)  BUN 8 - 23 mg/dL 20 23 18   Creatinine 0.61 - 1.24 mg/dL 1.65(H) 1.56(H) 1.26(H)  Sodium 135 - 145 mmol/L 143 142 142  Potassium 3.5 - 5.1 mmol/L 4.8 4.6 4.5  Chloride 98 - 111 mmol/L 108 110(H) 107  CO2 22 - 32 mmol/L 26 24 26   Calcium 8.9 - 10.3 mg/dL 9.2 9.2 9.0  Total Protein 6.5 - 8.1 g/dL 6.4(L) 6.4 -  Total Bilirubin 0.3 - 1.2 mg/dL 1.5(H) 1.0 -  Alkaline Phos 38 - 126 U/L 118 113 -  AST 15 - 41 U/L 20 15 -  ALT 0 - 44 U/L 17 11 -   . Lab Results  Component Value Date   LDH 171 06/24/2017     Component     Latest Ref Rng & Units 09/09/2016  IgG (Immunoglobin G), Serum     700 - 1,600 mg/dL 917  IgA/Immunoglobulin A, Serum     61 - 437 mg/dL 203  IgM, Qn, Serum     15 - 143 mg/dL 43  Total Protein     6.0 - 8.5 g/dL 6.1  Albumin SerPl Elph-Mcnc     2.9 - 4.4 g/dL 3.7  Alpha 1     0.0 - 0.4 g/dL 0.3  Alpha2 Glob SerPl Elph-Mcnc     0.4 - 1.0 g/dL 0.5  B-Globulin SerPl Elph-Mcnc     0.7 -  1.3 g/dL 0.8  Gamma Glob SerPl Elph-Mcnc     0.4 - 1.8 g/dL 0.7  M Protein SerPl Elph-Mcnc     Not Observed g/dL Not Observed  Globulin, Total     2.2 - 3.9 g/dL 2.4  Albumin/Glob SerPl     0.7 - 1.7 1.6  IFE 1      Comment  Please Note (HCV):      Comment  Iron     42 - 163 ug/dL 64  TIBC     202 - 409 ug/dL 194 (L)  UIBC     117 - 376 ug/dL 130  %SAT     20 - 55 % 33  Folate, Hemolysate     Not Estab. ng/mL 369.8  HCT     37.5 - 51.0 % 28.0 (L)  Folate, RBC     >498 ng/mL 1,321  Ig Kappa Free Light Chain     3.3 - 19.4 mg/L 21.4 (H)  Ig Lambda Free Light Chain     5.7 - 26.3 mg/L 36.6 (H)  Kappa/Lambda FluidC Ratio     0.26 - 1.65 0.58  LDH     125 - 245 U/L 326 (H)  Sed Rate     0 - 30 mm/hr 2  Vitamin B12     232 - 1245 pg/mL 1,261 (H)  TSH     0.320 - 4.118 m(IU)/L 2.609  Hep C Virus Ab     0.0 - 0.9 s/co ratio 0.1  Ferritin     22 - 316 ng/ml 399 (H)   Component      Latest Ref Rng & Units 09/17/2016 09/18/2016  Prothrombin Time     11.4 - 15.2 seconds  14.2  INR       1.11  LDH     125 - 245 U/L 309 (H)   Haptoglobin     34 - 200 mg/dL <10 (L)   Coombs', Direct     Negative Negative   APTT     24 - 36 seconds  32   Component     Latest Ref Rng & Units 09/30/2016  RA Latex Turbid.     0.0 - 13.9 IU/mL <10.0  CCP Antibodies IgG/IgA     0 - 19 units 12  ANA Ab, IFA      Negative  Sed Rate     0 - 30 mm/hr 2   Component     Latest Ref Rng & Units 09/30/2016 09/30/2016 09/30/2016 10/22/2016         3:23 PM  3:23 PM  3:23 PM   Interpretation       Comment    Comment:       Comment Comment   Specimen:       Peripheral Blood    Submitted Dx:       Comment    Viability:       97%    Cell Population       Comment    Granulocytes:       Comment    Monocytes:       Comment    Antibodies Performed:       Comment    Director Review       Comment    RA Latex Turbid.     0.0 - 13.9 IU/mL <10.0     CCP Antibodies IgG/IgA     0 - 19 units 12     ANA Ab, IFA  Negative     LDH     125 - 245 U/L    185           RADIOGRAPHIC STUDIES: I have personally reviewed the radiological images as listed and agreed with the findings in the report. No results found.  ASSESSMENT & PLAN:   #1 Pancytopenia with normocytic Anemia, neutropenia and thrombocytopenia This could certainly be from the patient's splenomegaly causing hypersplenism. Etiology of the splenomegaly is unclear.  Bone marrow showed no overt evidence of MDS/Myelofibrosis no evidence of acute leukemia Increased CD8+ T cell population ? Reactive vs clonal. Normal cytogenetics. Sed rate WNL neg ANA and Rheumatoid panel  No evidence of monoclonal paraproteinemia. TSH levels within normal limits B12, folate and iron levels within normal limits. Hepatitis C negative  Given improvement in counts - the T cell population likely reactive and could be related to a viral  infection that appears to be resolving now.  05/07/17 US Abdomen revealed decreased spleen size to about 900 cubic cm, and explains mild thrombocytopenia with PLT at 128k   #2 Elevated LDH With mild elevated bilirubin cannot r/o some hemolysis though that would not explain leucopenia/neutropenia or thrombocytopenia and splenomegaly by itself LDH slightly lower. Haptoglobin <10 --- suggestive of some hemolysis. Coombs neg.  LDH levels have normalized  PLAN -Discussed pt labwork today, 12/24/17; HGB slightly lower at 10.1, PLT stable at 122k, some neutropenia with ANC at 1.2k -Discussed the 10/02/17 US Abdomen which revealed Status post cholecystectomy. Bilateral simple renal cysts are noted. Mild splenomegaly is noted with calculated volume of 630 cubic cm, which is decreased compared to prior exam.  -Discussed that as the patient's blood counts have dropped, I would recommend checking his counts again in 4 months, instead of 6 months, and will consider a BM Bx if worsening counts suggest progressive nature -Recommend an empiric Vitamin B complex, especially in the setting of Protonix use, and noted that Vitamin B deficiencies can cause lower blood counts  -Advise that PCP continue to watch kidney counts while on Cozaar and Protonix -Pt will let me know if he develops any new concerns in the interim -Will see the pt back in 4 months     RTC with Dr Irene Limbo with labs in 4 months    Orders Placed This Encounter  Procedures  . CBC with Differential/Platelet    Standing Status:   Future    Standing Expiration Date:   01/28/2019  . CMP ([place] only)    Standing Status:   Future    Standing Expiration Date:   12/25/2018  . Reticulocytes    Standing Status:   Future    Standing Expiration Date:   12/25/2018  . Sample to Blood Bank    Standing Status:   Future    Standing Expiration Date:   12/25/2018    All of the patients questions were answered with apparent satisfaction. The patient  knows to call the clinic with any problems, questions or concerns.  The total time spent in the appt was 20 minutes and more than 50% was on counseling and direct patient cares.   Sullivan Lone MD [place] AAHIVMS [facility] Hematology/Oncology Physician [facility]  (Office):       [phone] (Work cell):  [phone] (Fax):           [phone]  I, Baldwin [place], am acting as a scribe for Dr. Sullivan Lone.   .I have reviewed the above documentation for accuracy and  completeness, and I agree with the above. Brunetta Genera MD

## 2017-12-24 ENCOUNTER — Inpatient Hospital Stay (HOSPITAL_BASED_OUTPATIENT_CLINIC_OR_DEPARTMENT_OTHER): Payer: Medicare Other | Admitting: Hematology

## 2017-12-24 ENCOUNTER — Telehealth: Payer: Self-pay | Admitting: Hematology

## 2017-12-24 ENCOUNTER — Inpatient Hospital Stay: Payer: Medicare Other | Attending: Hematology

## 2017-12-24 VITALS — BP 151/63 | HR 77 | Temp 98.0°F | Resp 20 | Wt 204.0 lb

## 2017-12-24 DIAGNOSIS — D61818 Other pancytopenia: Secondary | ICD-10-CM | POA: Diagnosis not present

## 2017-12-24 DIAGNOSIS — D696 Thrombocytopenia, unspecified: Secondary | ICD-10-CM | POA: Insufficient documentation

## 2017-12-24 DIAGNOSIS — Z87891 Personal history of nicotine dependence: Secondary | ICD-10-CM

## 2017-12-24 DIAGNOSIS — D72820 Lymphocytosis (symptomatic): Secondary | ICD-10-CM

## 2017-12-24 DIAGNOSIS — D709 Neutropenia, unspecified: Secondary | ICD-10-CM

## 2017-12-24 DIAGNOSIS — D649 Anemia, unspecified: Secondary | ICD-10-CM | POA: Insufficient documentation

## 2017-12-24 DIAGNOSIS — M48061 Spinal stenosis, lumbar region without neurogenic claudication: Secondary | ICD-10-CM

## 2017-12-24 DIAGNOSIS — I2510 Atherosclerotic heart disease of native coronary artery without angina pectoris: Secondary | ICD-10-CM

## 2017-12-24 DIAGNOSIS — I4891 Unspecified atrial fibrillation: Secondary | ICD-10-CM | POA: Diagnosis not present

## 2017-12-24 DIAGNOSIS — I10 Essential (primary) hypertension: Secondary | ICD-10-CM | POA: Diagnosis not present

## 2017-12-24 DIAGNOSIS — R161 Splenomegaly, not elsewhere classified: Secondary | ICD-10-CM

## 2017-12-24 LAB — CMP (CANCER CENTER ONLY)
ALBUMIN: 4 g/dL (ref 3.5–5.0)
ALT: 17 U/L (ref 0–44)
ANION GAP: 9 (ref 5–15)
AST: 20 U/L (ref 15–41)
Alkaline Phosphatase: 118 U/L (ref 38–126)
BUN: 20 mg/dL (ref 8–23)
CHLORIDE: 108 mmol/L (ref 98–111)
CO2: 26 mmol/L (ref 22–32)
CREATININE: 1.65 mg/dL — AB (ref 0.61–1.24)
Calcium: 9.2 mg/dL (ref 8.9–10.3)
GFR, EST AFRICAN AMERICAN: 44 mL/min — AB (ref >60)
GFR, Estimated: 38 mL/min — ABNORMAL LOW (ref >60)
GLUCOSE: 110 mg/dL — AB (ref 70–99)
Potassium: 4.8 mmol/L (ref 3.5–5.1)
Sodium: 143 mmol/L (ref 135–145)
Total Bilirubin: 1.5 mg/dL — ABNORMAL HIGH (ref 0.3–1.2)
Total Protein: 6.4 g/dL — ABNORMAL LOW (ref 6.5–8.1)

## 2017-12-24 LAB — CBC WITH DIFFERENTIAL/PLATELET
ABS IMMATURE GRANULOCYTES: 0 10*3/uL (ref 0.00–0.07)
BASOS ABS: 0 10*3/uL (ref 0.0–0.1)
BASOS PCT: 0 %
EOS ABS: 0 10*3/uL (ref 0.0–0.5)
EOS PCT: 1 %
HEMATOCRIT: 31.5 % — AB (ref 39.0–52.0)
Hemoglobin: 10.1 g/dL — ABNORMAL LOW (ref 13.0–17.0)
Immature Granulocytes: 0 %
LYMPHS PCT: 48 %
Lymphs Abs: 1.3 10*3/uL (ref 0.7–4.0)
MCH: 26.1 pg (ref 26.0–34.0)
MCHC: 32.1 g/dL (ref 30.0–36.0)
MCV: 81.4 fL (ref 80.0–100.0)
MONO ABS: 0.3 10*3/uL (ref 0.1–1.0)
Monocytes Relative: 10 %
NRBC: 0 % (ref 0.0–0.2)
Neutro Abs: 1.2 10*3/uL — ABNORMAL LOW (ref 1.7–7.7)
Neutrophils Relative %: 41 %
PLATELETS: 122 10*3/uL — AB (ref 150–400)
RBC: 3.87 MIL/uL — ABNORMAL LOW (ref 4.22–5.81)
RDW: 16.7 % — ABNORMAL HIGH (ref 11.5–15.5)
WBC: 2.8 10*3/uL — AB (ref 4.0–10.5)

## 2017-12-24 NOTE — Telephone Encounter (Signed)
Printed calendar and avs. °

## 2017-12-31 DIAGNOSIS — M5136 Other intervertebral disc degeneration, lumbar region: Secondary | ICD-10-CM | POA: Diagnosis not present

## 2017-12-31 DIAGNOSIS — M48061 Spinal stenosis, lumbar region without neurogenic claudication: Secondary | ICD-10-CM | POA: Diagnosis not present

## 2018-02-10 DIAGNOSIS — M545 Low back pain: Secondary | ICD-10-CM | POA: Diagnosis not present

## 2018-02-10 DIAGNOSIS — M4316 Spondylolisthesis, lumbar region: Secondary | ICD-10-CM | POA: Diagnosis not present

## 2018-02-10 DIAGNOSIS — M48061 Spinal stenosis, lumbar region without neurogenic claudication: Secondary | ICD-10-CM | POA: Diagnosis not present

## 2018-02-10 DIAGNOSIS — M4310 Spondylolisthesis, site unspecified: Secondary | ICD-10-CM | POA: Diagnosis not present

## 2018-02-10 DIAGNOSIS — M5136 Other intervertebral disc degeneration, lumbar region: Secondary | ICD-10-CM | POA: Diagnosis not present

## 2018-02-25 DIAGNOSIS — M5136 Other intervertebral disc degeneration, lumbar region: Secondary | ICD-10-CM | POA: Diagnosis not present

## 2018-03-16 DIAGNOSIS — L814 Other melanin hyperpigmentation: Secondary | ICD-10-CM | POA: Diagnosis not present

## 2018-03-16 DIAGNOSIS — L819 Disorder of pigmentation, unspecified: Secondary | ICD-10-CM | POA: Diagnosis not present

## 2018-03-16 DIAGNOSIS — L820 Inflamed seborrheic keratosis: Secondary | ICD-10-CM | POA: Diagnosis not present

## 2018-03-16 DIAGNOSIS — L821 Other seborrheic keratosis: Secondary | ICD-10-CM | POA: Diagnosis not present

## 2018-03-16 DIAGNOSIS — D1801 Hemangioma of skin and subcutaneous tissue: Secondary | ICD-10-CM | POA: Diagnosis not present

## 2018-03-16 DIAGNOSIS — D229 Melanocytic nevi, unspecified: Secondary | ICD-10-CM | POA: Diagnosis not present

## 2018-03-16 DIAGNOSIS — Z85820 Personal history of malignant melanoma of skin: Secondary | ICD-10-CM | POA: Diagnosis not present

## 2018-03-16 DIAGNOSIS — L570 Actinic keratosis: Secondary | ICD-10-CM | POA: Diagnosis not present

## 2018-03-17 DIAGNOSIS — H6123 Impacted cerumen, bilateral: Secondary | ICD-10-CM | POA: Diagnosis not present

## 2018-03-17 DIAGNOSIS — H903 Sensorineural hearing loss, bilateral: Secondary | ICD-10-CM | POA: Diagnosis not present

## 2018-03-22 DIAGNOSIS — M48061 Spinal stenosis, lumbar region without neurogenic claudication: Secondary | ICD-10-CM | POA: Diagnosis not present

## 2018-03-22 DIAGNOSIS — M5136 Other intervertebral disc degeneration, lumbar region: Secondary | ICD-10-CM | POA: Diagnosis not present

## 2018-04-14 DIAGNOSIS — M545 Low back pain: Secondary | ICD-10-CM | POA: Diagnosis not present

## 2018-04-28 NOTE — Progress Notes (Signed)
Brandon Fischer    HEMATOLOGY/ONCOLOGY CLINIC NOTE  Date of Service: 04/29/2018  Patient Care Team: Deland Pretty, MD as PCP - General (Internal Medicine)  CHIEF COMPLAINTS/PURPOSE OF CONSULTATION:  Anemia and leucopenia  HISTORY OF PRESENTING ILLNESS:   Brandon Fischer is a wonderful 85 y.o. male  who has been referred to [place] by Dr .Deland Pretty, MD for evaluation and management of anemia and leukopenia.  Patient has a history of hypertension, dyslipidemia, atrial fibrillation, coronary artery disease, bilateral carotid artery stenosis, paroxysmal supraventricular tachycardias status post radiofrequency ablation, GERD who on recent labs with his primary care physician on 09/01/2016 which showed new anemia with a hemoglobin of 9.3 with an MCV of 82.9 and RDW of 18.5. WBC count of 2.1k with an ANC of about 700 and mild thrombocytopenia of 119k.  CMP showed bilirubin of 1.7 with an alkaline phosphatase of 154 normal AST ALT creatinine 1.9 with BUN of 24.  Patient subsequently had an ultrasound of the abdomen done on 09/09/2016 for evaluation of his abnormal liver function tests and renal insufficiency and was noted to have severe splenomegaly with this plane off 18 cm in size with a volume of 1407 mL. No focal hepatic abnormalities. No bladder distention.. 3.7 cm abdominal aortic aneurysm.  Repeat labs done in clinic today show a slight improvement in WBC count of 3.6k with an ANC of 1000. Hemoglobin 9.4 and platelets of 108k . Patient has a obvious deep palpable spleen on clinical examination. Overtly palpable lymphadenopathy noted.  Patient notes no overt weight loss fevers chills or night sweats.  No change in mental status. No chest pain no shortness of breath no significant abdominal discomfort headaches. No evidence of bleeding. No issues with recent infections. Some fatigue.  INTERVAL HISTORY  Brandon Fischer returns today for management and evaluation of his pancytopenia. The patient's  last visit with [place] was on 12/24/17. The pt reports that he is doing well overall.  The pt reports that he has stenosis and DDD which is bothersome for him. He scheduled an elective surgery which has seen been cancelled due to the COVID-19 pandemic. The pt notes that he has been staying at home and avoiding public spaces.   The pt endorses good energy levels. He has continue to take a daily Vitamin B complex. He denies fevers, chills, night sweats, unexpected weight loss. He denies any concerns for blood loss including blood in the stools, black stools, or abnormal bruising. The pt also denies recent or current infections. He denies mouth sores.  Lab results today (04/29/18) of CBC w/diff, Reticulocytes is as follows: all values are WNL except for RBC at 4.05, HGB at 10.6, HCT at 32.2, MCV at 79.5, RDW at 15.6, PLT at 148k.  On review of systems, pt reports good energy levels, eating well, and denies fevers, chills, night sweats, unexpected weight loss, blood in the stools, black stools, abnormal bruising, concern for infections, mouth sores, new lumps or bumps, abdominal pains, leg swelling, and any other symptoms.    MEDICAL HISTORY:  Past Medical History:  Diagnosis Date  . At risk for sleep apnea    STOP-BANG= 4    SENT TO PCP 02-06-2014  . Bilateral carotid artery stenosis without cerebral infarction    ASYMPTOMATIC--  BILATERAL ICA  50-69%  PER CARDIOLOGIST NOTE, DR Einar Gip  . Bilateral carotid bruits    LEFT > RIGHT  . Coronary artery disease cardiologist-- dr Stevphen [place] CAD  per cath 08-06-2004 HX PALPITATIONS-AND -  TACHYCARDIA-  . First degree heart block   . Full dentures   . GERD (gastroesophageal reflux disease)   . Heart palpitations    Not any more  . History of atrial fibrillation without current medication    EPISODE OF AFIB WITH RVR INTRAOPERATIVELY 02-09-2014 AT [facility]--  RESOLVED AND PT FOLLOWED UP WITH CARDIOLOGIST  DR Einar Gip  . History of kidney stones   .  Hyperlipidemia   . Hypertension   . Nephrolithiasis    RIGHT  . Organic impotence   . Psoriasis    Clearing up  . PSVT (paroxysmal supraventricular tachycardia) ([place])   . Right ureteral calculus   . S/P radiofrequency ablation operation for arrhythmia-SVT 04/28/2014  . Simple renal cyst    right  . Wears glasses   . Wears hearing aid    BILATERAL  History of malignant melanoma status post Mohs surgery to remove the skin lesion on the face with skin grafting. 3 years ago.  SURGICAL HISTORY: Past Surgical History:  Procedure Laterality Date  . CARDIAC CATHETERIZATION  08-06-2004 dr Verlon Setting   (abnormal stress test) Non-obstructive CAD, pLAD 20-30%,  LCX 30-40%, pRCA 20-30%, dRCA 40%, distal LM  mild diffuse calcifation 20-30% tapering stenosis,  preserved LVSF ef 55-60%  . CARPAL TUNNEL RELEASE Bilateral 2005  . CATARACT EXTRACTION, BILATERAL Bilateral 2011  . CHOLECYSTECTOMY OPEN  1982   and NEPHROLITHOTOMY  . COLONOSCOPY W/ POLYPECTOMY  last one 04-05-2008  . CYSTOSCOPY WITH LITHOLAPAXY Right 04/14/2017   Procedure: CYSTOSCOPY WITH LITHOLAPAXY/RIGHT RETROGRADE PLYOGRAM/RIGHT URETEROSCOPY;  Surgeon: [name], MD;  Location: [facility];  Service: Urology;  Laterality: Right;  . CYSTOSCOPY WITH RETROGRADE PYELOGRAM, URETEROSCOPY AND STENT PLACEMENT Right 03/14/2014   Procedure: CYSTO/RIGHT RETROGRADE PYELOGRAM/RIGHT URETEROSCOPY/STONE EXTRACTION/STENT PLACEMENT;  Surgeon: Malka So, MD;  Location: [facility];  Service: Urology;  Laterality: Right;  . HOLMIUM LASER APPLICATION Right 03/15/8248   Procedure: RIGHT HOLMIUM LASER APPLICATION;  Surgeon: Malka So, MD;  Location: [facility];  Service: Urology;  Laterality: Right;  . KNEE ARTHROSCOPY W/ DEBRIDEMENT Right 11-23-2002   and Synovectomy  . NEPHROLITHOTOMY  01/16/2011   Procedure: NEPHROLITHOTOMY PERCUTANEOUS;  Surgeon: Malka So;  Location: [facility];  Service: Urology;  Laterality: Left;  C-Arm   Holmium Laser  . NEPHROLITHOTOMY  1981  . SUPRAVENTRICULAR TACHYCARDIA ABLATION N/A 04/28/2014   Procedure: SUPRAVENTRICULAR TACHYCARDIA ABLATION;  Surgeon: Evans Lance, MD;  Location: [facility] CATH LAB;  Service: Cardiovascular;  Laterality: N/A;    SOCIAL HISTORY: Social History   Socioeconomic History  . Marital status: Married    Spouse name: Not on file  . Number of children: 2  . Years of education: Not on file  . Highest education level: Not on file  Occupational History  . Occupation: Actor     Comment: retired  Scientific laboratory technician  . Financial resource strain: Not on file  . Food insecurity:    Worry: Not on file    Inability: Not on file  . Transportation needs:    Medical: Not on file    Non-medical: Not on file  Tobacco Use  . Smoking status: Former Smoker    Packs/day: 1.00    Years: 40.00    Pack years: 40.00    Types: Cigarettes    Last attempt to quit: 02/06/1998    Years since quitting: 20.2  . Smokeless tobacco: Never Used  Substance and Sexual Activity  . Alcohol use: No  . Drug use:  No  . Sexual activity: Not on file  Lifestyle  . Physical activity:    Days per week: Not on file    Minutes per session: Not on file  . Stress: Not on file  Relationships  . Social connections:    Talks on phone: Not on file    Gets together: Not on file    Attends religious service: Not on file    Active member of club or organization: Not on file    Attends meetings of clubs or organizations: Not on file    Relationship status: Not on file  . Intimate partner violence:    Fear of current or ex partner: Not on file    Emotionally abused: Not on file    Physically abused: Not on file    Forced sexual activity: Not on file  Other Topics Concern  . Not on file  Social History Narrative  . Not on file    FAMILY HISTORY: Family History  Problem Relation Age of Onset  . Cancer Mother        Ovarian or colon  . Heart attack Father   . Heart attack Brother      ALLERGIES:  has No Known Allergies.  MEDICATIONS:  Current Outpatient Medications  Medication Sig Dispense Refill  . acetaminophen (TYLENOL) 500 MG tablet Take 1,000 mg by mouth every 6 (six) hours as needed (for pain).    Brandon Fischer aspirin 81 MG tablet Take 81 mg by mouth every other day. IN THE MORNING    . atorvastatin (LIPITOR) 20 MG tablet Take 20 mg by mouth every evening.     . cyclobenzaprine (FLEXERIL) 5 MG tablet Take 5 mg by mouth 3 (three) times daily as needed for muscle spasms.    . diphenhydramine-acetaminophen (TYLENOL PM) 25-500 MG TABS Take 2 tablets by mouth at bedtime.    Brandon Fischer losartan (COZAAR) 50 MG tablet Take 50 mg by mouth daily.    . pantoprazole (PROTONIX) 40 MG tablet Take 40 mg by mouth daily at 2 PM.     . traMADol (ULTRAM) 50 MG tablet Take 50 mg by mouth every 6 (six) hours as needed for moderate pain.      No current facility-administered medications for this visit.     REVIEW OF SYSTEMS:    A 10+ POINT REVIEW OF SYSTEMS WAS OBTAINED including neurology, dermatology, psychiatry, cardiac, respiratory, lymph, extremities, GI, GU, Musculoskeletal, constitutional, breasts, reproductive, HEENT.  All pertinent positives are noted in the HPI.  All others are negative.   PHYSICAL EXAMINATION:  ECOG PERFORMANCE STATUS: 2 - Symptomatic, <50% confined to bed  Vitals:   04/29/18 1212  BP: 140/66  Pulse: 71  Resp: 17  Temp: 98.3 F (36.8 C)  SpO2: 100%   Filed Weights   04/29/18 1212  Weight: 202 lb 9.6 oz (91.9 kg)   .Body mass index is 28.26 kg/m.  GENERAL:alert, in no acute distress and comfortable SKIN: no acute rashes, no significant lesions EYES: conjunctiva are pink and non-injected, sclera anicteric OROPHARYNX: MMM, no exudates, no oropharyngeal erythema or ulceration NECK: supple, no JVD LYMPH:  no palpable lymphadenopathy in the cervical, axillary or inguinal regions LUNGS: clear to auscultation b/l with normal respiratory effort HEART: regular  rate & rhythm ABDOMEN:  normoactive bowel sounds , non tender, not distended. No palpable hepatosplenomegaly.  Extremity: no pedal edema PSYCH: alert & oriented x 3 with fluent speech NEURO: no focal motor/sensory deficits   LABORATORY DATA:  I have reviewed  the data as listed  . CBC Latest Ref Rng & Units 04/29/2018 12/24/2017 06/24/2017  WBC 4.0 - 10.5 K/uL 5.2 2.8(L) 3.9(L)  Hemoglobin 13.0 - 17.0 g/dL 10.6(L) 10.1(L) 11.0(L)  Hematocrit 39.0 - 52.0 % 32.2(L) 31.5(L) 34.0(L)  Platelets 150 - 400 K/uL 148(L) 122(L) 128(L)   ANC 1800  CBC    Component Value Date/Time   WBC 5.2 04/29/2018 1137   RBC 4.09 (L) 04/29/2018 1137   RBC 4.05 (L) 04/29/2018 1137   HGB 10.6 (L) 04/29/2018 1137   HGB 12.0 (L) 12/24/2016 1113   HCT 32.2 (L) 04/29/2018 1137   HCT 36.2 (L) 12/24/2016 1113   PLT 148 (L) 04/29/2018 1137   PLT 123 (L) 12/24/2016 1113   MCV 79.5 (L) 04/29/2018 1137   MCV 79.7 12/24/2016 1113   MCH 26.2 04/29/2018 1137   MCHC 32.9 04/29/2018 1137   RDW 15.6 (H) 04/29/2018 1137   RDW 16.2 (H) 12/24/2016 1113   LYMPHSABS 1.4 04/29/2018 1137   LYMPHSABS 0.7 (L) 12/24/2016 1113   MONOABS 0.4 04/29/2018 1137   MONOABS 0.4 12/24/2016 1113   EOSABS 0.1 04/29/2018 1137   EOSABS 0.0 12/24/2016 1113   BASOSABS 0.0 04/29/2018 1137   BASOSABS 0.0 12/24/2016 1113     . CMP Latest Ref Rng & Units 12/24/2017 06/24/2017 04/09/2017  Glucose 70 - 99 mg/dL 110(H) 115 104(H)  BUN 8 - 23 mg/dL 20 23 18   Creatinine 0.61 - 1.24 mg/dL 1.65(H) 1.56(H) 1.26(H)  Sodium 135 - 145 mmol/L 143 142 142  Potassium 3.5 - 5.1 mmol/L 4.8 4.6 4.5  Chloride 98 - 111 mmol/L 108 110(H) 107  CO2 22 - 32 mmol/L 26 24 26   Calcium 8.9 - 10.3 mg/dL 9.2 9.2 9.0  Total Protein 6.5 - 8.1 g/dL 6.4(L) 6.4 -  Total Bilirubin 0.3 - 1.2 mg/dL 1.5(H) 1.0 -  Alkaline Phos 38 - 126 U/L 118 113 -  AST 15 - 41 U/L 20 15 -  ALT 0 - 44 U/L 17 11 -   . Lab Results  Component Value Date   LDH 171 06/24/2017      Component     Latest Ref Rng & Units 09/09/2016  IgG (Immunoglobin G), Serum     700 - 1,600 mg/dL 917  IgA/Immunoglobulin A, Serum     61 - 437 mg/dL 203  IgM, Qn, Serum     15 - 143 mg/dL 43  Total Protein     6.0 - 8.5 g/dL 6.1  Albumin SerPl Elph-Mcnc     2.9 - 4.4 g/dL 3.7  Alpha 1     0.0 - 0.4 g/dL 0.3  Alpha2 Glob SerPl Elph-Mcnc     0.4 - 1.0 g/dL 0.5  B-Globulin SerPl Elph-Mcnc     0.7 - 1.3 g/dL 0.8  Gamma Glob SerPl Elph-Mcnc     0.4 - 1.8 g/dL 0.7  M Protein SerPl Elph-Mcnc     Not Observed g/dL Not Observed  Globulin, Total     2.2 - 3.9 g/dL 2.4  Albumin/Glob SerPl     0.7 - 1.7 1.6  IFE 1      Comment  Please Note (HCV):      Comment  Iron     42 - 163 ug/dL 64  TIBC     202 - 409 ug/dL 194 (L)  UIBC     117 - 376 ug/dL 130  %SAT     20 - 55 % 33  Folate, Hemolysate  Not Estab. ng/mL 369.8  HCT     37.5 - 51.0 % 28.0 (L)  Folate, RBC     >498 ng/mL 1,321  Ig Kappa Free Light Chain     3.3 - 19.4 mg/L 21.4 (H)  Ig Lambda Free Light Chain     5.7 - 26.3 mg/L 36.6 (H)  Kappa/Lambda FluidC Ratio     0.26 - 1.65 0.58  LDH     125 - 245 U/L 326 (H)  Sed Rate     0 - 30 mm/hr 2  Vitamin B12     232 - 1245 pg/mL 1,261 (H)  TSH     0.320 - 4.118 m(IU)/L 2.609  Hep C Virus Ab     0.0 - 0.9 s/co ratio 0.1  Ferritin     22 - 316 ng/ml 399 (H)   Component     Latest Ref Rng & Units 09/17/2016 09/18/2016  Prothrombin Time     11.4 - 15.2 seconds  14.2  INR       1.11  LDH     125 - 245 U/L 309 (H)   Haptoglobin     34 - 200 mg/dL <10 (L)   Coombs', Direct     Negative Negative   APTT     24 - 36 seconds  32   Component     Latest Ref Rng & Units 09/30/2016  RA Latex Turbid.     0.0 - 13.9 IU/mL <10.0  CCP Antibodies IgG/IgA     0 - 19 units 12  ANA Ab, IFA      Negative  Sed Rate     0 - 30 mm/hr 2   Component     Latest Ref Rng & Units 09/30/2016 09/30/2016 09/30/2016 10/22/2016         3:23 PM  3:23 PM  3:23 PM    Interpretation       Comment    Comment:       Comment Comment   Specimen:       Peripheral Blood    Submitted Dx:       Comment    Viability:       97%    Cell Population       Comment    Granulocytes:       Comment    Monocytes:       Comment    Antibodies Performed:       Comment    Director Review       Comment    RA Latex Turbid.     0.0 - 13.9 IU/mL <10.0     CCP Antibodies IgG/IgA     0 - 19 units 12     ANA Ab, IFA      Negative     LDH     125 - 245 U/L    185           RADIOGRAPHIC STUDIES: I have personally reviewed the radiological images as listed and agreed with the findings in the report. No results found.  ASSESSMENT & PLAN:   #1 Pancytopenia with normocytic Anemia, neutropenia and thrombocytopenia This could certainly be from the patient's splenomegaly causing hypersplenism. Etiology of the splenomegaly is unclear.  09/18/16 Bone marrow showed no overt evidence of MDS/Myelofibrosis no evidence of acute leukemia Increased CD8+ T cell population ? Reactive vs clonal. Normal cytogenetics. Sed rate WNL neg ANA and Rheumatoid panel  No evidence of monoclonal paraproteinemia.  TSH levels within normal limits B12, folate and iron levels within normal limits. Hepatitis C negative  Given improvement in counts - the T cell population likely reactive and could be related to a viral infection that appears to be resolving now.  05/07/17 US Abdomen revealed decreased spleen size to about 900 cubic cm, and explains mild thrombocytopenia with PLT at 128k   10/02/17 US Abdomen revealed Status post cholecystectomy. Bilateral simple renal cysts are noted. Mild splenomegaly is noted with calculated volume of 630 cubic cm, which is decreased compared to prior exam.  #2 Elevated LDH With mild elevated bilirubin cannot r/o some hemolysis though that would not explain leucopenia/neutropenia or thrombocytopenia and splenomegaly by itself LDH slightly lower.  Haptoglobin <10 --- suggestive of some hemolysis. Coombs neg.  LDH levels have normalized  PLAN -Discussed pt labwork today, 04/29/18; WBC normalized to 5.2k, HGB improved to 10.6, PLT near normalized to 148k. ANC normalized to 3.3k. -Developing some microcytosis which can suggest some iron deficiency -Recommend increasing nutritional iron intake with balanced diet -Continue Vitamin B complex -As patient's blood counts have improved overall since beginning daily Vitamin B complex, will continue to trend out his counts and pursue watchful observation -Will consider repeating a BM Bx if worsening counts suggest progressive nature -Advise that PCP continue to watch kidney counts while on Cozaar and Protonix -Pt will let me know if he develops any new concerns in the interim -Will see the pt back in 6 months   RTC with Dr Brandon Fischer with labs in 6 months     Orders Placed This Encounter  Procedures  . CBC with Differential/Platelet    Standing Status:   Future    Standing Expiration Date:   06/03/2019  . CMP ([place] only)    Standing Status:   Future    Standing Expiration Date:   04/29/2019  . Ferritin    Standing Status:   Future    Standing Expiration Date:   04/29/2019  . Erythropoietin    Standing Status:   Future    Standing Expiration Date:   04/29/2019    All of the patients questions were answered with apparent satisfaction. The patient knows to call the clinic with any problems, questions or concerns.  The total time spent in the appt was 20 minutes and more than 50% was on counseling and direct patient cares.   Sullivan Lone MD [place] AAHIVMS [facility] Hematology/Oncology Physician [facility]  (Office):       [phone] (Work cell):  [phone] (Fax):           [phone]  I, Brandon Fischer, am acting as a scribe for Dr. Sullivan Lone.   .I have reviewed the above documentation for accuracy and completeness, and I agree with the above. Brandon Genera MD

## 2018-04-29 ENCOUNTER — Inpatient Hospital Stay (HOSPITAL_BASED_OUTPATIENT_CLINIC_OR_DEPARTMENT_OTHER): Payer: Medicare Other | Admitting: Hematology

## 2018-04-29 ENCOUNTER — Inpatient Hospital Stay: Payer: Medicare Other

## 2018-04-29 ENCOUNTER — Other Ambulatory Visit: Payer: Self-pay

## 2018-04-29 ENCOUNTER — Telehealth: Payer: Self-pay | Admitting: Hematology

## 2018-04-29 ENCOUNTER — Inpatient Hospital Stay: Payer: Medicare Other | Attending: Hematology

## 2018-04-29 VITALS — BP 140/66 | HR 71 | Temp 98.3°F | Resp 17 | Ht 71.0 in | Wt 202.6 lb

## 2018-04-29 DIAGNOSIS — Z87891 Personal history of nicotine dependence: Secondary | ICD-10-CM | POA: Insufficient documentation

## 2018-04-29 DIAGNOSIS — R740 Nonspecific elevation of levels of transaminase and lactic acid dehydrogenase [LDH]: Secondary | ICD-10-CM | POA: Insufficient documentation

## 2018-04-29 DIAGNOSIS — D709 Neutropenia, unspecified: Secondary | ICD-10-CM | POA: Diagnosis not present

## 2018-04-29 DIAGNOSIS — R161 Splenomegaly, not elsewhere classified: Secondary | ICD-10-CM | POA: Insufficient documentation

## 2018-04-29 DIAGNOSIS — D61818 Other pancytopenia: Secondary | ICD-10-CM | POA: Diagnosis not present

## 2018-04-29 DIAGNOSIS — D649 Anemia, unspecified: Secondary | ICD-10-CM | POA: Diagnosis not present

## 2018-04-29 DIAGNOSIS — D696 Thrombocytopenia, unspecified: Secondary | ICD-10-CM | POA: Insufficient documentation

## 2018-04-29 DIAGNOSIS — I10 Essential (primary) hypertension: Secondary | ICD-10-CM | POA: Diagnosis not present

## 2018-04-29 DIAGNOSIS — Z7982 Long term (current) use of aspirin: Secondary | ICD-10-CM | POA: Diagnosis not present

## 2018-04-29 DIAGNOSIS — E7800 Pure hypercholesterolemia, unspecified: Secondary | ICD-10-CM | POA: Diagnosis not present

## 2018-04-29 LAB — CMP (CANCER CENTER ONLY)
ALT: 13 U/L (ref 0–44)
AST: 15 U/L (ref 15–41)
Albumin: 4.1 g/dL (ref 3.5–5.0)
Alkaline Phosphatase: 126 U/L (ref 38–126)
Anion gap: 11 (ref 5–15)
BUN: 33 mg/dL — ABNORMAL HIGH (ref 8–23)
CO2: 19 mmol/L — ABNORMAL LOW (ref 22–32)
Calcium: 8.6 mg/dL — ABNORMAL LOW (ref 8.9–10.3)
Chloride: 110 mmol/L (ref 98–111)
Creatinine: 2.07 mg/dL — ABNORMAL HIGH (ref 0.61–1.24)
GFR, Est AFR Am: 33 mL/min — ABNORMAL LOW (ref >60)
GFR, Estimated: 29 mL/min — ABNORMAL LOW (ref >60)
Glucose, Bld: 127 mg/dL — ABNORMAL HIGH (ref 70–99)
Potassium: 3.8 mmol/L (ref 3.5–5.1)
Sodium: 140 mmol/L (ref 135–145)
Total Bilirubin: 1.2 mg/dL (ref 0.3–1.2)
Total Protein: 6.6 g/dL (ref 6.5–8.1)

## 2018-04-29 LAB — CBC WITH DIFFERENTIAL/PLATELET
Abs Immature Granulocytes: 0 10*3/uL (ref 0.00–0.07)
Basophils Absolute: 0 10*3/uL (ref 0.0–0.1)
Basophils Relative: 0 %
Eosinophils Absolute: 0.1 10*3/uL (ref 0.0–0.5)
Eosinophils Relative: 1 %
HCT: 32.2 % — ABNORMAL LOW (ref 39.0–52.0)
Hemoglobin: 10.6 g/dL — ABNORMAL LOW (ref 13.0–17.0)
Immature Granulocytes: 0 %
Lymphocytes Relative: 28 %
Lymphs Abs: 1.4 10*3/uL (ref 0.7–4.0)
MCH: 26.2 pg (ref 26.0–34.0)
MCHC: 32.9 g/dL (ref 30.0–36.0)
MCV: 79.5 fL — ABNORMAL LOW (ref 80.0–100.0)
Monocytes Absolute: 0.4 10*3/uL (ref 0.1–1.0)
Monocytes Relative: 7 %
Neutro Abs: 3.3 10*3/uL (ref 1.7–7.7)
Neutrophils Relative %: 64 %
Platelets: 148 10*3/uL — ABNORMAL LOW (ref 150–400)
RBC: 4.05 MIL/uL — ABNORMAL LOW (ref 4.22–5.81)
RDW: 15.6 % — ABNORMAL HIGH (ref 11.5–15.5)
WBC: 5.2 10*3/uL (ref 4.0–10.5)
nRBC: 0 % (ref 0.0–0.2)

## 2018-04-29 LAB — RETICULOCYTES
Immature Retic Fract: 7.2 % (ref 2.3–15.9)
RBC.: 4.09 MIL/uL — ABNORMAL LOW (ref 4.22–5.81)
Retic Count, Absolute: 59.7 10*3/uL (ref 19.0–186.0)
Retic Ct Pct: 1.5 % (ref 0.4–3.1)

## 2018-04-29 LAB — SAMPLE TO BLOOD BANK

## 2018-04-29 NOTE — Telephone Encounter (Signed)
Scheduled appt per 4/16 los. °

## 2018-05-04 DIAGNOSIS — Z1212 Encounter for screening for malignant neoplasm of rectum: Secondary | ICD-10-CM | POA: Diagnosis not present

## 2018-05-04 DIAGNOSIS — I440 Atrioventricular block, first degree: Secondary | ICD-10-CM | POA: Diagnosis not present

## 2018-05-04 DIAGNOSIS — K219 Gastro-esophageal reflux disease without esophagitis: Secondary | ICD-10-CM | POA: Diagnosis not present

## 2018-05-04 DIAGNOSIS — D61818 Other pancytopenia: Secondary | ICD-10-CM | POA: Diagnosis not present

## 2018-05-04 DIAGNOSIS — M545 Low back pain: Secondary | ICD-10-CM | POA: Diagnosis not present

## 2018-05-04 DIAGNOSIS — Z7982 Long term (current) use of aspirin: Secondary | ICD-10-CM | POA: Diagnosis not present

## 2018-05-04 DIAGNOSIS — Z0000 Encounter for general adult medical examination without abnormal findings: Secondary | ICD-10-CM | POA: Diagnosis not present

## 2018-05-04 DIAGNOSIS — I2510 Atherosclerotic heart disease of native coronary artery without angina pectoris: Secondary | ICD-10-CM | POA: Diagnosis not present

## 2018-05-04 DIAGNOSIS — Z8679 Personal history of other diseases of the circulatory system: Secondary | ICD-10-CM | POA: Diagnosis not present

## 2018-05-04 DIAGNOSIS — I10 Essential (primary) hypertension: Secondary | ICD-10-CM | POA: Diagnosis not present

## 2018-05-04 DIAGNOSIS — N400 Enlarged prostate without lower urinary tract symptoms: Secondary | ICD-10-CM | POA: Diagnosis not present

## 2018-05-04 DIAGNOSIS — N183 Chronic kidney disease, stage 3 (moderate): Secondary | ICD-10-CM | POA: Diagnosis not present

## 2018-05-10 DIAGNOSIS — D631 Anemia in chronic kidney disease: Secondary | ICD-10-CM | POA: Diagnosis not present

## 2018-05-10 DIAGNOSIS — I2510 Atherosclerotic heart disease of native coronary artery without angina pectoris: Secondary | ICD-10-CM | POA: Diagnosis not present

## 2018-05-10 DIAGNOSIS — N200 Calculus of kidney: Secondary | ICD-10-CM | POA: Diagnosis not present

## 2018-05-10 DIAGNOSIS — I129 Hypertensive chronic kidney disease with stage 1 through stage 4 chronic kidney disease, or unspecified chronic kidney disease: Secondary | ICD-10-CM | POA: Diagnosis not present

## 2018-05-10 DIAGNOSIS — R809 Proteinuria, unspecified: Secondary | ICD-10-CM | POA: Diagnosis not present

## 2018-05-10 DIAGNOSIS — N183 Chronic kidney disease, stage 3 (moderate): Secondary | ICD-10-CM | POA: Diagnosis not present

## 2018-05-19 ENCOUNTER — Other Ambulatory Visit: Payer: Self-pay | Admitting: Nephrology

## 2018-05-19 DIAGNOSIS — N183 Chronic kidney disease, stage 3 unspecified: Secondary | ICD-10-CM

## 2018-05-28 ENCOUNTER — Ambulatory Visit
Admission: RE | Admit: 2018-05-28 | Discharge: 2018-05-28 | Disposition: A | Discharge status: Home / Self Care | Payer: Medicare Other | Source: Ambulatory Visit | Attending: Nephrology | Admitting: Nephrology

## 2018-05-28 DIAGNOSIS — N183 Chronic kidney disease, stage 3 unspecified: Secondary | ICD-10-CM

## 2018-05-31 IMAGING — CT CT BIOPSY AND ASPIRATION BONE MARROW
1 of 2 series · 15 of 29 positions shown, 19 images · non-contrast
Comparison: none

INDICATION: Pancytopenia

[Series 2: i-spiral 5.0 b40f · axial · 0.83mm/px · z∈[+1158,+1228]mm · 15 of 24 slices shown, 19 images]
[im 2/24  mediastinal]
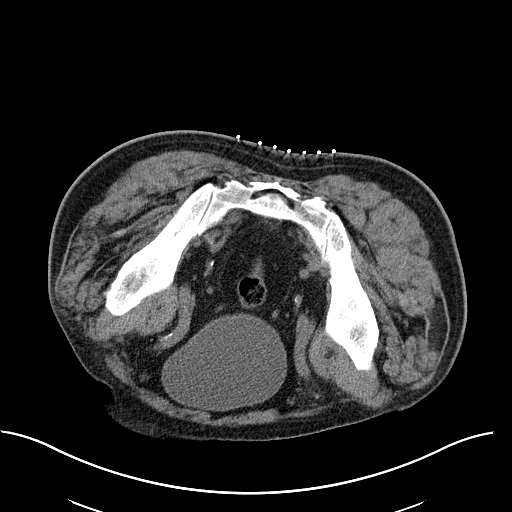
[im 2/24  lung]
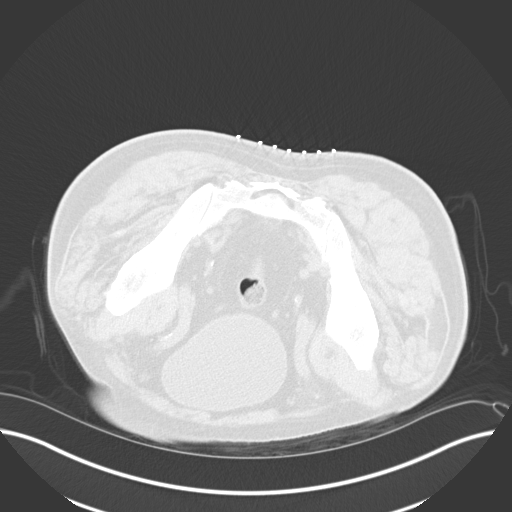
[im 3/24  lung]
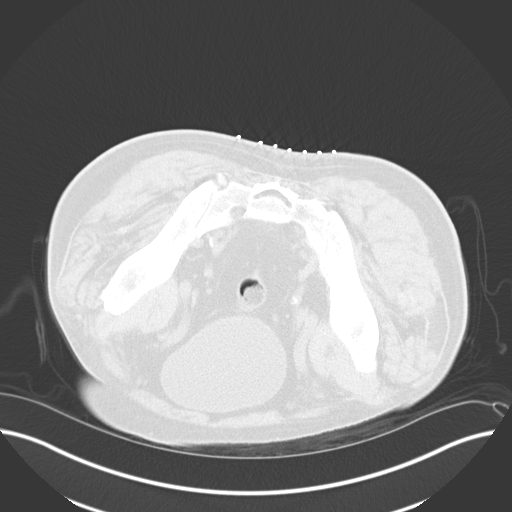
[im 6/24  lung]
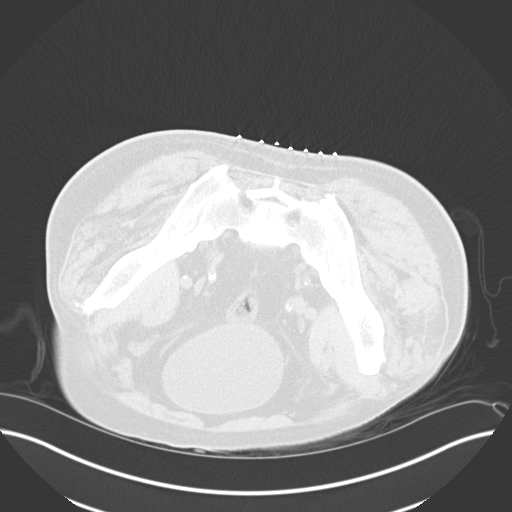
[im 7/24  lung]
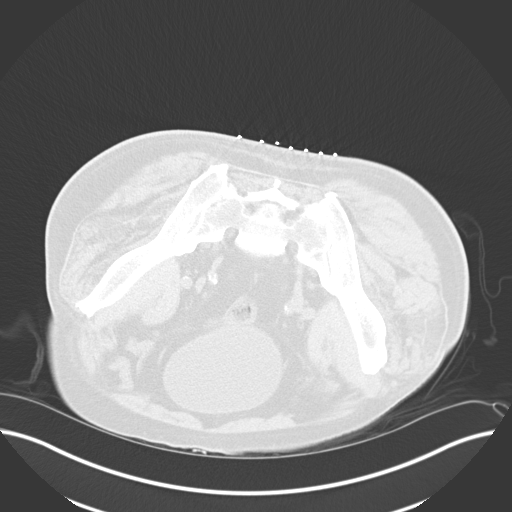
[im 8/24  mediastinal]
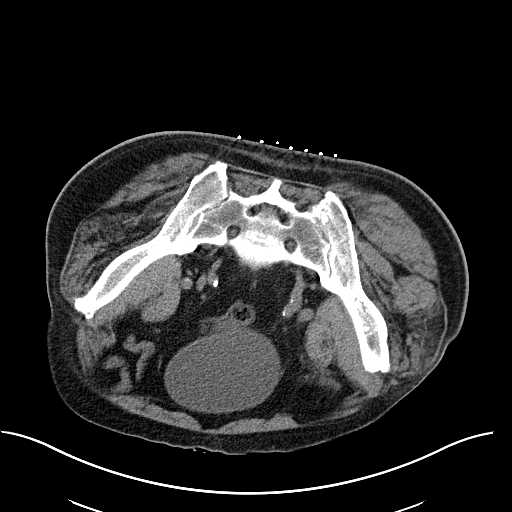
[im 8/24  lung]
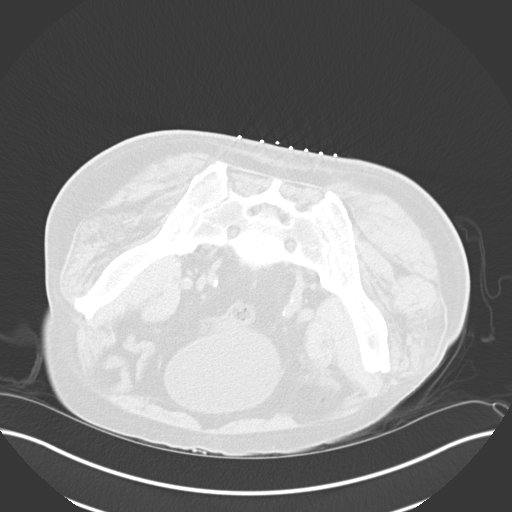
[im 9/24  lung]
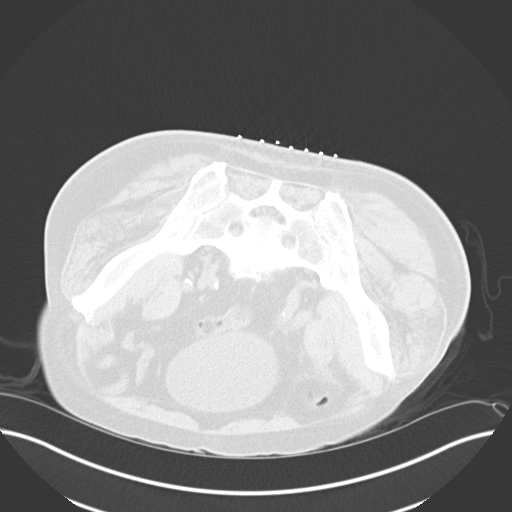
[im 10/24  lung]
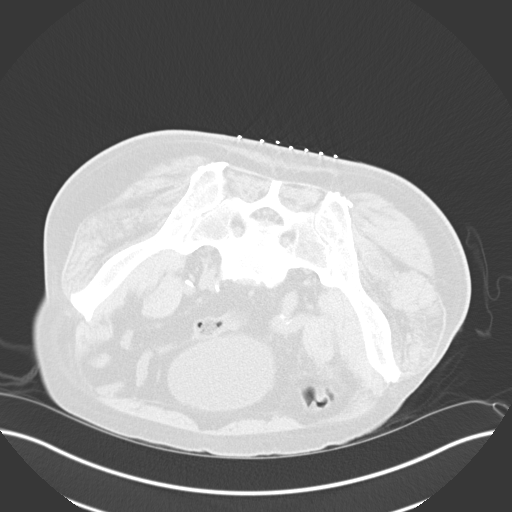
[im 12/24  lung]
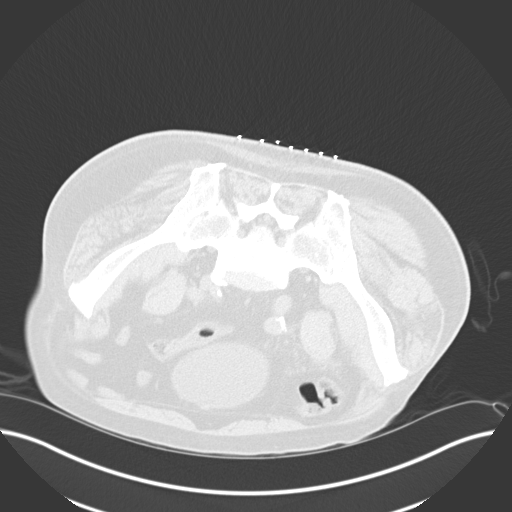
[im 13/24  mediastinal]
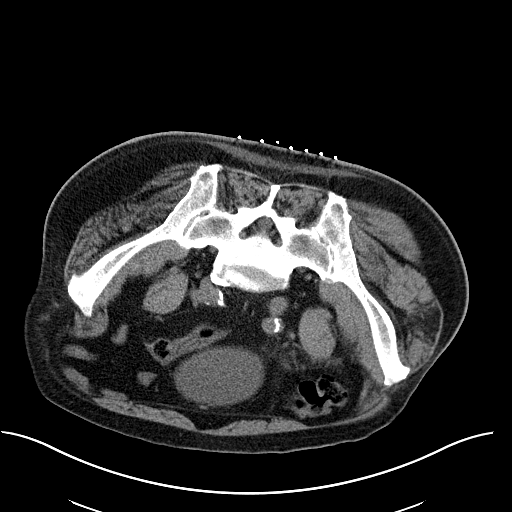
[im 13/24  lung]
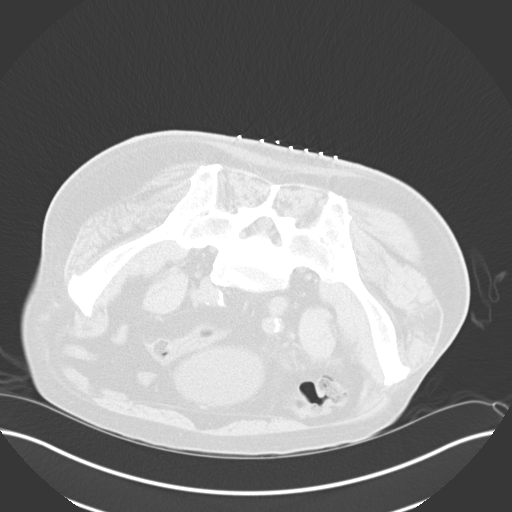
[im 15/24  lung]
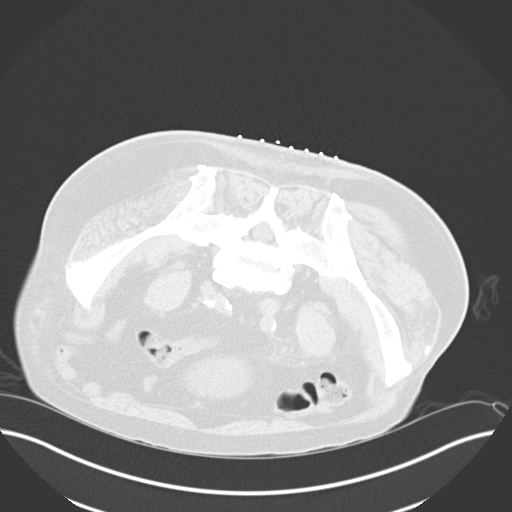
[im 16/24  lung]
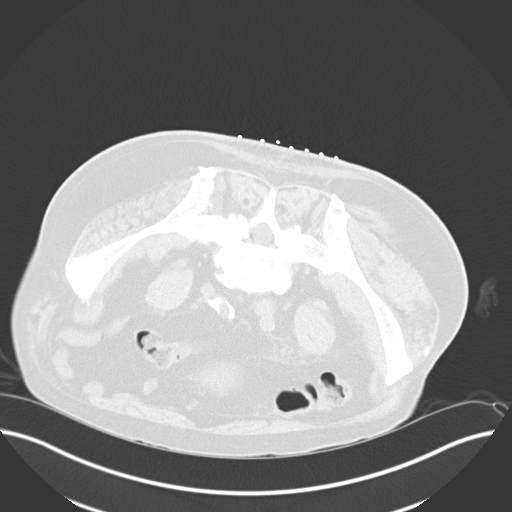
[im 17/24  lung]
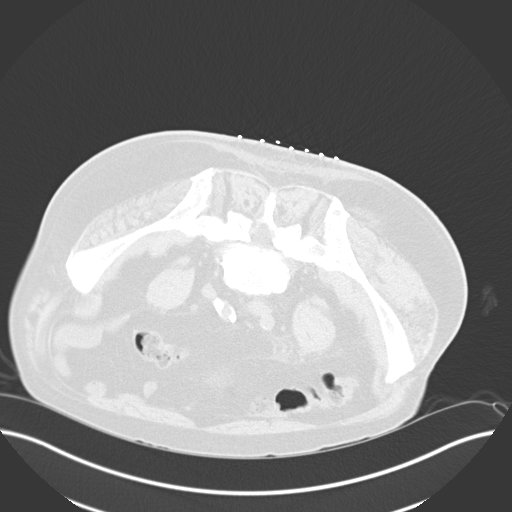
[im 20/24  mediastinal]
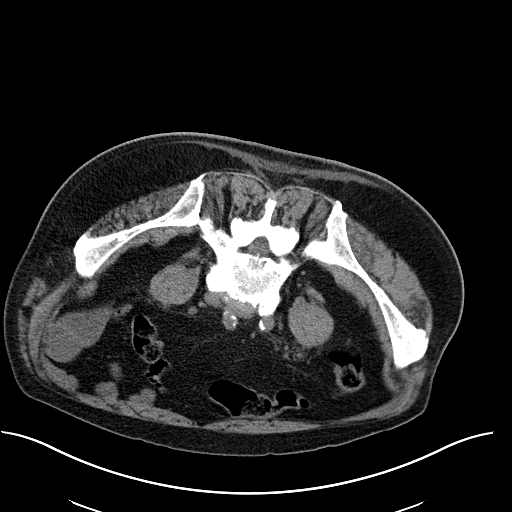
[im 20/24  lung]
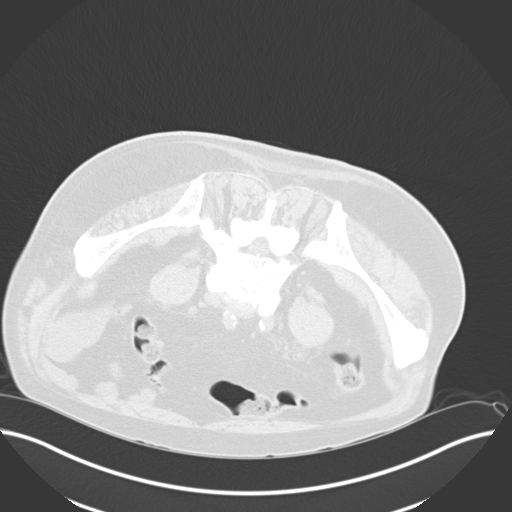
[im 21/24  lung]
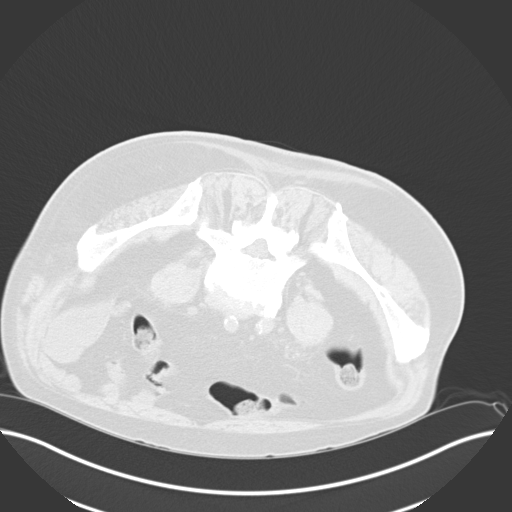
[im 22/24  lung]
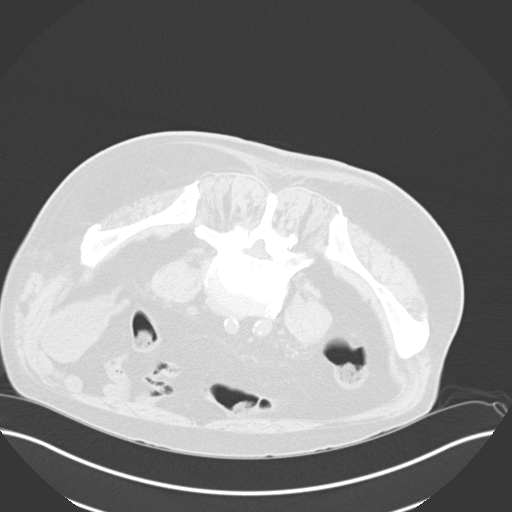

[15 of 29 positions shown; findings below may reference images not displayed]

EXAM:
CT BIOPSY; CT BONE MARROW BIOPSY AND ASPIRATION

MEDICATIONS:
None.

ANESTHESIA/SEDATION:
Fentanyl 50 mcg IV; Versed 1 mg IV

Moderate Sedation Time:  10

The patient was continuously monitored during the procedure by the
interventional radiology nurse under my direct supervision.

FLUOROSCOPY TIME:  Fluoroscopy Time:  minutes  seconds ( mGy).

COMPLICATIONS:
None immediate.

PROCEDURE:
Informed written consent was obtained from the patient after a
thorough discussion of the procedural risks, benefits and
alternatives. All questions were addressed. Maximal Sterile Barrier
Technique was utilized including caps, mask, sterile gowns, sterile
gloves, sterile drape, hand hygiene and skin antiseptic. A timeout
was performed prior to the initiation of the procedure.

Under CT guidance, a(n) 11 gauge guide needle was advanced into the
right iliac bone. Aspirates and a core were obtained Post biopsy
images demonstrate no hemorrhage.

Patient tolerated the procedure well without complication. Vital
sign monitoring by nursing staff during the procedure will continue
as patient is in the special procedures unit for post procedure
observation.
FINDINGS: The images document guide needle placement within the right iliac
bone. Post biopsy images demonstrate no hemorrhage.
IMPRESSION: Successful CT-guided bone marrow aspirate and core.

## 2018-06-23 DIAGNOSIS — D631 Anemia in chronic kidney disease: Secondary | ICD-10-CM | POA: Diagnosis not present

## 2018-06-23 DIAGNOSIS — N183 Chronic kidney disease, stage 3 (moderate): Secondary | ICD-10-CM | POA: Diagnosis not present

## 2018-06-24 DIAGNOSIS — H5201 Hypermetropia, right eye: Secondary | ICD-10-CM | POA: Diagnosis not present

## 2018-06-24 DIAGNOSIS — H349 Unspecified retinal vascular occlusion: Secondary | ICD-10-CM | POA: Diagnosis not present

## 2018-06-24 DIAGNOSIS — H348312 Tributary (branch) retinal vein occlusion, right eye, stable: Secondary | ICD-10-CM | POA: Diagnosis not present

## 2018-06-24 DIAGNOSIS — Z961 Presence of intraocular lens: Secondary | ICD-10-CM | POA: Diagnosis not present

## 2018-06-29 DIAGNOSIS — M545 Low back pain: Secondary | ICD-10-CM | POA: Diagnosis not present

## 2018-06-29 DIAGNOSIS — M48061 Spinal stenosis, lumbar region without neurogenic claudication: Secondary | ICD-10-CM | POA: Diagnosis not present

## 2018-06-29 DIAGNOSIS — M5136 Other intervertebral disc degeneration, lumbar region: Secondary | ICD-10-CM | POA: Diagnosis not present

## 2018-06-29 DIAGNOSIS — M4310 Spondylolisthesis, site unspecified: Secondary | ICD-10-CM | POA: Diagnosis not present

## 2018-07-01 DIAGNOSIS — I2510 Atherosclerotic heart disease of native coronary artery without angina pectoris: Secondary | ICD-10-CM | POA: Diagnosis not present

## 2018-07-01 DIAGNOSIS — N183 Chronic kidney disease, stage 3 (moderate): Secondary | ICD-10-CM | POA: Diagnosis not present

## 2018-07-01 DIAGNOSIS — R809 Proteinuria, unspecified: Secondary | ICD-10-CM | POA: Diagnosis not present

## 2018-07-01 DIAGNOSIS — N200 Calculus of kidney: Secondary | ICD-10-CM | POA: Diagnosis not present

## 2018-07-01 DIAGNOSIS — I129 Hypertensive chronic kidney disease with stage 1 through stage 4 chronic kidney disease, or unspecified chronic kidney disease: Secondary | ICD-10-CM | POA: Diagnosis not present

## 2018-07-01 DIAGNOSIS — D631 Anemia in chronic kidney disease: Secondary | ICD-10-CM | POA: Diagnosis not present

## 2018-07-02 DIAGNOSIS — M545 Low back pain: Secondary | ICD-10-CM | POA: Diagnosis not present

## 2018-07-02 DIAGNOSIS — M5136 Other intervertebral disc degeneration, lumbar region: Secondary | ICD-10-CM | POA: Diagnosis not present

## 2018-07-05 ENCOUNTER — Ambulatory Visit: Payer: Self-pay | Admitting: Orthopedic Surgery

## 2018-07-08 ENCOUNTER — Encounter (HOSPITAL_COMMUNITY)
Admission: RE | Admit: 2018-07-08 | Discharge: 2018-07-08 | Disposition: A | Discharge status: Home / Self Care | Payer: Medicare Other | Source: Ambulatory Visit | Attending: Orthopedic Surgery | Admitting: Orthopedic Surgery

## 2018-07-08 ENCOUNTER — Other Ambulatory Visit: Payer: Self-pay

## 2018-07-08 ENCOUNTER — Encounter (HOSPITAL_COMMUNITY): Payer: Self-pay

## 2018-07-08 DIAGNOSIS — M48061 Spinal stenosis, lumbar region without neurogenic claudication: Secondary | ICD-10-CM | POA: Insufficient documentation

## 2018-07-08 DIAGNOSIS — Z01818 Encounter for other preprocedural examination: Secondary | ICD-10-CM | POA: Insufficient documentation

## 2018-07-08 DIAGNOSIS — I10 Essential (primary) hypertension: Secondary | ICD-10-CM | POA: Diagnosis not present

## 2018-07-08 HISTORY — DX: Chronic kidney disease, unspecified: N18.9

## 2018-07-08 LAB — PROTIME-INR
INR: 1.1 (ref 0.8–1.2)
Prothrombin Time: 13.9 seconds (ref 11.4–15.2)

## 2018-07-08 LAB — CBC
HCT: 40.1 % (ref 39.0–52.0)
Hemoglobin: 12.7 g/dL — ABNORMAL LOW (ref 13.0–17.0)
MCH: 26.1 pg (ref 26.0–34.0)
MCHC: 31.7 g/dL (ref 30.0–36.0)
MCV: 82.5 fL (ref 80.0–100.0)
Platelets: 188 10*3/uL (ref 150–400)
RBC: 4.86 MIL/uL (ref 4.22–5.81)
RDW: 15.5 % (ref 11.5–15.5)
WBC: 7.5 10*3/uL (ref 4.0–10.5)
nRBC: 0 % (ref 0.0–0.2)

## 2018-07-08 LAB — URINALYSIS, ROUTINE W REFLEX MICROSCOPIC
Bilirubin Urine: NEGATIVE
Glucose, UA: NEGATIVE mg/dL
Hgb urine dipstick: NEGATIVE
Ketones, ur: NEGATIVE mg/dL
Leukocytes,Ua: NEGATIVE
Nitrite: NEGATIVE
Protein, ur: 30 mg/dL — AB
Specific Gravity, Urine: 1.024 (ref 1.005–1.030)
pH: 5 (ref 5.0–8.0)

## 2018-07-08 LAB — APTT: aPTT: 31 seconds (ref 24–36)

## 2018-07-08 LAB — SURGICAL PCR SCREEN
MRSA, PCR: NEGATIVE
Staphylococcus aureus: NEGATIVE

## 2018-07-08 LAB — BASIC METABOLIC PANEL
Anion gap: 12 (ref 5–15)
BUN: 16 mg/dL (ref 8–23)
CO2: 22 mmol/L (ref 22–32)
Calcium: 9.1 mg/dL (ref 8.9–10.3)
Chloride: 105 mmol/L (ref 98–111)
Creatinine, Ser: 1.49 mg/dL — ABNORMAL HIGH (ref 0.61–1.24)
GFR calc Af Amer: 50 mL/min — ABNORMAL LOW (ref >60)
GFR calc non Af Amer: 43 mL/min — ABNORMAL LOW (ref >60)
Glucose, Bld: 110 mg/dL — ABNORMAL HIGH (ref 70–99)
Potassium: 3.6 mmol/L (ref 3.5–5.1)
Sodium: 139 mmol/L (ref 135–145)

## 2018-07-08 LAB — TYPE AND SCREEN
ABO/RH(D): O POS
Antibody Screen: NEGATIVE

## 2018-07-08 LAB — ABO/RH: ABO/RH(D): O POS

## 2018-07-08 NOTE — Progress Notes (Addendum)
PCP - Deland Pretty Cardiologist - Einar Gip Per Levada Dy, requesting records for last office visit, EKG, echo, stress test (if available)  Chest x-ray - 07-08-18 EKG - 07-08-18  Cardiac Cath - 2006  Anesthesia review: yes, heart history Levada Dy notified of EKG, requesting old records from [place]  Patient denies shortness of breath, fever, cough and chest pain at PAT appointment   Patient verbalized understanding of instructions that were given to them at the PAT appointment. Patient was also instructed that they will need to review over the PAT instructions again at home before surgery.  At PAT appointment, pt could not recall medical history or surgical history.  Had to get Thayer Headings (wife) on the phone for PAT appointment to help answer questions. Pt's wife stated that he is going through early stages of dementia.

## 2018-07-08 NOTE — Progress Notes (Signed)
[place], [place] - [address] [place] [address] Phone: [phone] Fax: [phone]  [id number] - [place], [place] [address] Phone: [phone] Fax: [phone]      Your procedure is scheduled on July 2nd.  Report to [facility] Main Entrance "A" at 9:00 A.M., and check in at the Admitting office.  Call this number if you have problems the morning of surgery:  [phone]  Call [phone] if you have any questions prior to your surgery date Monday-Friday 8am-4pm    Remember:  Do not eat or drink after midnight.     Take these medicines the morning of surgery with A SIP OF WATER   Tylenol - if needed  Amlodipine (Norvasc)  Atorvastatin (Lipitor)  Protonix  Tramadol - if needed  7 days prior to surgery STOP taking any Aspirin (unless otherwise instructed by your surgeon), Aleve, Naproxen, Ibuprofen, Motrin, Advil, Goody's, BC's, all herbal medications, fish oil, and all vitamins.    The Morning of Surgery  Do not wear jewelry.  Do not wear lotions, powders, or perfumes/colognes, or deodorant  Men may shave face and neck.  Do not bring valuables to the hospital.  [facility] is not responsible for any belongings or valuables.  If you are a smoker, DO NOT Smoke 24 hours prior to surgery IF you wear a CPAP at night please bring your mask, tubing, and machine the morning of surgery   Remember that you must have someone to transport you home after your surgery, and remain with you for 24 hours if you are discharged the same day.   Contacts, glasses, hearing aids, dentures or bridgework may not be worn into surgery.    Leave your suitcase in the car.  After surgery it may be brought to your room.  For patients admitted to the hospital, discharge time will be determined by your treatment team.  Patients discharged the day of surgery will  not be allowed to drive home.    Special instructions:   [place]- Preparing For Surgery  Before surgery, you can play an important role. Because skin is not sterile, your skin needs to be as free of germs as possible. You can reduce the number of germs on your skin by washing with CHG (chlorahexidine gluconate) Soap before surgery.  CHG is an antiseptic cleaner which kills germs and bonds with the skin to continue killing germs even after washing.    Oral Hygiene is also important to reduce your risk of infection.  Remember - BRUSH YOUR TEETH THE MORNING OF SURGERY WITH YOUR REGULAR TOOTHPASTE  Please do not use if you have an allergy to CHG or antibacterial soaps. If your skin becomes reddened/irritated stop using the CHG.  Do not shave (including legs and underarms) for at least 48 hours prior to first CHG shower. It is OK to shave your face.  Please follow these instructions carefully.   1. Shower the NIGHT BEFORE SURGERY and the MORNING OF SURGERY with CHG Soap.   2. If you chose to wash your hair, wash your hair first as usual with your normal shampoo.  3. After you shampoo, rinse your hair and body thoroughly to remove the shampoo.  4. Use CHG as you would any other liquid soap. You can apply CHG directly to the skin and wash gently with a scrungie or a clean  washcloth.   5. Apply the CHG Soap to your body ONLY FROM THE NECK DOWN.  Do not use on open wounds or open sores. Avoid contact with your eyes, ears, mouth and genitals (private parts). Wash Face and genitals (private parts)  with your normal soap.   6. Wash thoroughly, paying special attention to the area where your surgery will be performed.  7. Thoroughly rinse your body with warm water from the neck down.  8. DO NOT shower/wash with your normal soap after using and rinsing off the CHG Soap.  9. Pat yourself dry with a CLEAN TOWEL.  10. Wear CLEAN PAJAMAS to bed the night before surgery, wear comfortable clothes  the morning of surgery  11. Place CLEAN SHEETS on your bed the night of your first shower and DO NOT SLEEP WITH PETS.    Day of Surgery:  Do not apply any deodorants/lotions.  Please wear clean clothes to the hospital/surgery center.   Remember to brush your teeth WITH YOUR REGULAR TOOTHPASTE.   Please read over the following fact sheets that you were given.

## 2018-07-09 ENCOUNTER — Encounter (HOSPITAL_COMMUNITY): Payer: Self-pay

## 2018-07-09 NOTE — Progress Notes (Addendum)
Anesthesia Chart Review:  Case: [id number] Date/Time: 07/15/18 1045   Procedure: Posterior lumbar decompression and fusion L4-5 (N/A ) - 4 hrs   Anesthesia type: General   Pre-op diagnosis: Degenerative spondylothesis with spinal stenosis   Location: [facility] OR ROOM 04 / [place] OR   Surgeon: Melina Schools, MD      DISCUSSION: Patient is an 85 year old male scheduled for the above procedure.  History includes former smoker (quit 2000), HTN, afib/SVT (s/p ablation 04/28/14, Brandon Peru, MD), palpitations (resolved), first degree AV block, CAD (non-obstructive, 2006), GERD, bilateral renal cysts (05/2018 [place]), CKD, carotid artery disease (69-48% LICA stenosis, 5462), HLD, psoriasis.  Wife Brandon Fischer reports he has signs of early dementia and can have issues answering some medical questions and feels she will need to be allowed access to hospital for his surgery.   PCP Dr. Shelia Media cleared patient for surgery, adding to observe for SVT and note CKD history.   Presurgical COVID test is scheduled for 07/12/2018 (in process). Reviewed history and medical clearance office visit with Dr. Shelia Media with anesthesiologist Suella Broad, MD. If no acute changes then it is anticipated that he can proceed as planned.   VS: BP (!) 155/72   Pulse 68   Temp 36.8 C   Resp 20   Ht 5' 10.5" (1.791 m)   Wt 90.5 kg   SpO2 100%   BMI 28.23 kg/m    PROVIDERS: Deland Pretty, MD is PCP - Sullivan Lone, MD is hematologist. Last visit 04/29/18 for anemia and leukopenia follow-up. Pancytopenia could be form splenomegaly (noted on 2018 [place]; normal spleen 2019), although etiology of his splenomegaly is unclear. 09/18/16 bone marrow aspirate "showed no overt evidence of MDS/Myelofibrosis no evidence of acute leukemia" There was increased CD8+ T cell population, but counts improved and thought likely reactive from resolving viral infection. Follow-up in 6 months.  Consider repeat bone marrow biopsy if worsening counts suggest progressive  nature. Brandon Jeans, Md is nephrolgoist - Adrian Prows, MD is cardiologist ([facility]). Last seen 07/24/14 and as needed follow-up recommended.    LABS: Labs reviewed: Acceptable for surgery. Renal function appears stable (Cr 1.49, 1.56-2.07 since 06/2017). (all labs ordered are listed, but only abnormal results are displayed)  Labs Reviewed  BASIC METABOLIC PANEL - Abnormal; Notable for the following components:      Result Value   Glucose, Bld 110 (*)    Creatinine, Ser 1.49 (*)    GFR calc non Af Amer 43 (*)    GFR calc Af Amer 50 (*)    All other components within normal limits  CBC - Abnormal; Notable for the following components:   Hemoglobin 12.7 (*)    All other components within normal limits  URINALYSIS, ROUTINE W REFLEX MICROSCOPIC - Abnormal; Notable for the following components:   Protein, ur 30 (*)    Bacteria, UA RARE (*)    All other components within normal limits  SURGICAL PCR SCREEN  APTT  PROTIME-INR  TYPE AND SCREEN  ABO/RH    IMAGES: CXR 07/08/18: IMPRESSION: No acute cardiopulmonary disease.  CT abd/pelvis w/ contrast 03/31/17: IMPRESSION: 1. Decreased burden of nephrolithiasis.  No obstructive uropathy. 2. Stones resting dependently within the urinary bladder without bladder outlet obstruction. 3. No acute abnormality of the abdomen or pelvis. Normal spleen. 4.  Aortic Atherosclerosis (ICD10-I70.0). Non-aneurysmal abdominal aorta.   EKG: 07/08/18: Sinus bradycardia Second degree heart block type 1 Cannot rule out Inferior infarct , age undetermined Abnormal ECG No significant  change since last tracing Confirmed by Candee Furbish [phone]) on 07/09/2018 1:45:02 PM   CV: Renal US 05/28/18: IMPRESSION: 1. Bilateral renal cysts. 2. Prostate measures 4.6 x 3.7 x 4.4 cm.  Event monitor 01/20/14 [facility] CV): 2 episodes of symptomatic SVT. (Referred for ablation.)  Carotid US 11/01/13 [facility]): Impression: 1.  Minimal disease  of the right carotid artery.  There is minimal evidence of heterogeneous plaque in the right carotid artery. 2.  Mild stenosis of the left distal internal carotid artery, mild internal carotid artery, proximal internal carotid artery and bulb (< 50% stenosis in the range of 16- 49%). -Follow-up in 1 year if clinically indicated.  Echo 03/19/11 (as outlined by [place] CV note): " Normal LVEF.  Mild to mod LVH, Grade II diastolic dysfunction.  Mild pulmonary HTN."  There is mention of a treadmill stress test is 2012 in [place] CV notes, but no results available.    Cardiac cath 08/06/04: RESULTS:  1.  Left main:  Mild diffuse calcification with distal 20% to 30% tapering      stenosis.  2.  LAD:  Ostial proximal 20% to 30% stenosis with mild mid and distal      luminal irregularities.  3.  LCX:  Nondominant, ostial proximal 30% to 40% stenosis with mid and      distal luminal irregularities.  4.  OM-1 and OM-2:  Moderate size with mild luminal irregularities.  5.  RCA:  Dominant with proximal 20% to 30% stenosis followed by a mid-      luminal irregularities and distal 40% stenosis.  6.  PDA and PLV:  Show moderate luminal irregularities, but no significant      obstructive disease.  7.  LV:  EF is 55% to 60% and no wall motion abnormalities.  LVEDP is 26      mmHg.  IMPRESSION:  1.  Nonobstructive coronary arteries.  2.  Preserved  left ventricular systolic function with an ejection fraction      of 55% to 60%.  PLAN:  Aggressive risk factor modification   Past Medical History:  Diagnosis Date  . At risk for sleep apnea    STOP-BANG= 4    SENT TO PCP 02-06-2014  . Bilateral carotid artery stenosis without cerebral infarction    ASYMPTOMATIC--  BILATERAL ICA  50-69%  PER CARDIOLOGIST NOTE, DR Einar Gip  . Bilateral carotid bruits    LEFT > RIGHT  . Coronary artery disease cardiologist-- dr Stevphen [place] CAD  per cath 08-06-2004 HX PALPITATIONS-AND -TACHYCARDIA-  . First  degree heart block   . Full dentures   . GERD (gastroesophageal reflux disease)   . Heart palpitations    Not any more  . History of atrial fibrillation without current medication    EPISODE OF AFIB WITH RVR INTRAOPERATIVELY 02-09-2014 AT [facility]--  RESOLVED AND PT FOLLOWED UP WITH CARDIOLOGIST  DR Einar Gip  . History of kidney stones   . Hyperlipidemia   . Hypertension   . Nephrolithiasis    RIGHT  . Organic impotence   . Psoriasis    Clearing up  . PSVT (paroxysmal supraventricular tachycardia) ([place])   . Right ureteral calculus   . S/P radiofrequency ablation operation for arrhythmia-SVT 04/28/2014  . Simple renal cyst    right  . Wears glasses   . Wears hearing aid    BILATERAL    Past Surgical History:  Procedure Laterality Date  . CARDIAC CATHETERIZATION  08-06-2004 dr Verlon Setting   (abnormal  stress test) Non-obstructive CAD, pLAD 20-30%,  LCX 30-40%, pRCA 20-30%, dRCA 40%, distal LM  mild diffuse calcifation 20-30% tapering stenosis,  preserved LVSF ef 55-60%  . CARPAL TUNNEL RELEASE Bilateral 2005  . CATARACT EXTRACTION, BILATERAL Bilateral 2011  . CHOLECYSTECTOMY OPEN  1982   and NEPHROLITHOTOMY  . COLONOSCOPY W/ POLYPECTOMY  last one 04-05-2008  . CYSTOSCOPY WITH LITHOLAPAXY Right 04/14/2017   Procedure: CYSTOSCOPY WITH LITHOLAPAXY/RIGHT RETROGRADE PLYOGRAM/RIGHT URETEROSCOPY;  Surgeon: [name], MD;  Location: [facility];  Service: Urology;  Laterality: Right;  . CYSTOSCOPY WITH RETROGRADE PYELOGRAM, URETEROSCOPY AND STENT PLACEMENT Right 03/14/2014   Procedure: CYSTO/RIGHT RETROGRADE PYELOGRAM/RIGHT URETEROSCOPY/STONE EXTRACTION/STENT PLACEMENT;  Surgeon: Malka So, MD;  [facility];  Service: Urology;  Laterality: Right;  . EYE SURGERY Bilateral    Cataracts removed  . HOLMIUM LASER APPLICATION Right 05/21/6823   Procedure: RIGHT HOLMIUM LASER APPLICATION;  Surgeon: Malka So, MD;  Location: [facility];  Service: Urology;  Laterality:  Right;  . KNEE ARTHROSCOPY W/ DEBRIDEMENT Right 11-23-2002   and Synovectomy  . NEPHROLITHOTOMY  01/16/2011   Procedure: NEPHROLITHOTOMY PERCUTANEOUS;  Surgeon: Malka So;  Location: [facility];  Service: Urology;  Laterality: Left;  C-Arm  Holmium Laser  . NEPHROLITHOTOMY  1981  . SUPRAVENTRICULAR TACHYCARDIA ABLATION N/A 04/28/2014   Procedure: SUPRAVENTRICULAR TACHYCARDIA ABLATION;  Surgeon: Evans Lance, MD;  Location: [facility] C[facility] LAB;  Service: Cardiovascular;  Laterality: N/A;    MEDICATIONS: . acetaminophen (TYLENOL) 500 MG tablet  . amLODipine (NORVASC) 10 MG tablet  . atorvastatin (LIPITOR) 20 MG tablet  . cyclobenzaprine (FLEXERIL) 5 MG tablet  . diphenhydramine-acetaminophen (TYLENOL PM) 25-500 MG TABS  . pantoprazole (PROTONIX) 40 MG tablet  . traMADol (ULTRAM) 50 MG tablet   No current facility-administered medications for this encounter.     Myra Gianotti, PA-C Surgical Short Stay/Anesthesiology [facility] Phone [phone] [facility] Phone [phone] 07/12/2018 2:49 PM

## 2018-07-12 ENCOUNTER — Other Ambulatory Visit (HOSPITAL_COMMUNITY)
Admission: RE | Admit: 2018-07-12 | Discharge: 2018-07-12 | Disposition: A | Discharge status: Home / Self Care | Payer: Medicare Other | Source: Ambulatory Visit | Attending: Orthopedic Surgery | Admitting: Orthopedic Surgery

## 2018-07-12 ENCOUNTER — Ambulatory Visit: Payer: Self-pay | Admitting: Orthopedic Surgery

## 2018-07-12 DIAGNOSIS — M48061 Spinal stenosis, lumbar region without neurogenic claudication: Secondary | ICD-10-CM | POA: Diagnosis not present

## 2018-07-12 DIAGNOSIS — Z01812 Encounter for preprocedural laboratory examination: Secondary | ICD-10-CM | POA: Diagnosis not present

## 2018-07-12 DIAGNOSIS — Z1159 Encounter for screening for other viral diseases: Secondary | ICD-10-CM | POA: Insufficient documentation

## 2018-07-12 LAB — SARS CORONAVIRUS 2 (TAT 6-24 HRS): SARS Coronavirus 2: NEGATIVE

## 2018-07-12 NOTE — H&P (Signed)
Subjective:   The patient is a very active 85 yo male who is here today for a pre-operative History and Physical. They are scheduled for Decompression and posterior fusion L4-5 on 07-15-18 with Dr. Rolena Infante at [facility]. To treat his bilateral neuropathic buttock/thigh pain. Despite physical therapy, injection therapy, and activity modification his overall quality-of-life continues to suffer and he would like to move forward with surgical intervention.  Patient Active Problem List   Diagnosis Date Noted  . S/P radiofrequency ablation operation for arrhythmia-SVT 04/28/2014  . SVT (supraventricular tachycardia) ([place]) 03/15/2014  . Nephrolithiasis 01/17/2011  . Hypertension   . Dyslipidemia   . Palpitation    Past Medical History:  Diagnosis Date  . At risk for sleep apnea    STOP-BANG= 4    SENT TO PCP 02-06-2014  . Bilateral carotid artery stenosis without cerebral infarction    ASYMPTOMATIC--  BILATERAL ICA  50-69%  PER CARDIOLOGIST NOTE, DR Einar Gip  . Bilateral carotid bruits    LEFT > RIGHT  . CKD (chronic kidney disease)   . Coronary artery disease cardiologist-- dr Stevphen [place] CAD  per cath 08-06-2004 HX PALPITATIONS-AND -TACHYCARDIA-  . First degree heart block   . Full dentures   . GERD (gastroesophageal reflux disease)   . Heart palpitations    Not any more  . History of atrial fibrillation without current medication    EPISODE OF AFIB WITH RVR INTRAOPERATIVELY 02-09-2014 AT [facility]--  RESOLVED AND PT FOLLOWED UP WITH CARDIOLOGIST  DR Einar Gip  . History of kidney stones   . Hyperlipidemia   . Hypertension   . Nephrolithiasis    RIGHT  . Organic impotence   . Psoriasis    Clearing up  . PSVT (paroxysmal supraventricular tachycardia) ([place])   . Right ureteral calculus   . S/P radiofrequency ablation operation for arrhythmia-SVT 04/28/2014  . Simple renal cyst    right  . Wears glasses   . Wears hearing aid    BILATERAL    Past Surgical History:   Procedure Laterality Date  . CARDIAC CATHETERIZATION  08-06-2004 dr Verlon Setting   (abnormal stress test) Non-obstructive CAD, pLAD 20-30%,  LCX 30-40%, pRCA 20-30%, dRCA 40%, distal LM  mild diffuse calcifation 20-30% tapering stenosis,  preserved LVSF ef 55-60%  . CARPAL TUNNEL RELEASE Bilateral 2005  . CATARACT EXTRACTION, BILATERAL Bilateral 2011  . CHOLECYSTECTOMY OPEN  1982   and NEPHROLITHOTOMY  . COLONOSCOPY W/ POLYPECTOMY  last one 04-05-2008  . CYSTOSCOPY WITH LITHOLAPAXY Right 04/14/2017   Procedure: CYSTOSCOPY WITH LITHOLAPAXY/RIGHT RETROGRADE PLYOGRAM/RIGHT URETEROSCOPY;  Surgeon: [name], MD;  Location[facility];  Service: Urology;  Laterality: Right;  . CYSTOSCOPY WITH RETROGRADE PYELOGRAM, URETEROSCOPY AND STENT PLACEMENT Right 03/14/2014   Procedure: CYSTO/RIGHT RETROGRADE PYELOGRAM/RIGHT URETEROSCOPY/STONE EXTRACTION/STENT PLACEMENT;  Surgeon: Malka So, MD;  Location: [facility];  Service: Urology;  Laterality: Right;  . EYE SURGERY Bilateral    Cataracts removed  . HOLMIUM LASER APPLICATION Right 08/14/9560   Procedure: RIGHT HOLMIUM LASER APPLICATION;  Surgeon: Malka So, MD;  Location: [facility];  Service: Urology;  Laterality: Right;  . KNEE ARTHROSCOPY W/ DEBRIDEMENT Right 11-23-2002   and Synovectomy  . NEPHROLITHOTOMY  01/16/2011   Procedure: NEPHROLITHOTOMY PERCUTANEOUS;  Surgeon: Malka So;  Location: [facility];  Service: Urology;  Laterality: Left;  C-Arm  Holmium Laser  . NEPHROLITHOTOMY  1981  . SUPRAVENTRICULAR TACHYCARDIA ABLATION N/A 04/28/2014   Procedure: SUPRAVENTRICULAR TACHYCARDIA ABLATION;  Surgeon: Carleene Overlie  Peyton Najjar, MD;  Location: [facility] CATH LAB;  Service: Cardiovascular;  Laterality: N/A;    Current Outpatient Medications  Medication Sig Dispense Refill Last Dose  . acetaminophen (TYLENOL) 500 MG tablet Take 1,000 mg by mouth every 6 (six) hours as needed for moderate pain or headache.    Taking  . amLODipine (NORVASC) 10 MG  tablet Take 10 mg by mouth daily.      [name] atorvastatin (LIPITOR) 20 MG tablet Take 20 mg by mouth every evening.    Taking  . cyclobenzaprine (FLEXERIL) 5 MG tablet Take 5 mg by mouth at bedtime as needed for muscle spasms.    Taking  . diphenhydramine-acetaminophen (TYLENOL PM) 25-500 MG TABS Take 2 tablets by mouth at bedtime.   Taking  . pantoprazole (PROTONIX) 40 MG tablet Take 40 mg by mouth daily.    Taking  . traMADol (ULTRAM) 50 MG tablet Take 50 mg by mouth every 6 (six) hours as needed for moderate pain.    Taking   No current facility-administered medications for this visit.    No Known Allergies  Social History   Tobacco Use  . Smoking status: Former Smoker    Packs/day: 1.00    Years: 40.00    Pack years: 40.00    Types: Cigarettes    Quit date: 02/06/1998    Years since quitting: 20.4  . Smokeless tobacco: Never Used  Substance Use Topics  . Alcohol use: No    Family History  Problem Relation Age of Onset  . Cancer Mother        Ovarian or colon  . Heart attack Father   . Heart attack Brother     Review of Systems As stated in HPI  Objective:    Clinical exam: Keyler is a pleasant individual, who appears younger than their stated age. He Is alert and orientated 3. No shortness of breath, chest pain. Abdomen is soft and non-tender, negative loss of bowel and bladder control, no rebound tenderness. Negative: skin lesions abrasions contusions  Cardio: RRR, no rubs, murmurs, or gallops  Lungs: CTAB  Abdomen: BSX4, non-tender, non-distended, no hepatosplenomegaly.  Peripheral pulses: 2+ peripheral pulses in the lower extremity bilaterally. Compartment soft and nontender.  Gait pattern: Altered gait pattern due to significant back and right gluteal pain. Stands in forward flexed posture.  Assistive devices: None  Neuro: 5/5 motor strength bilaterally in the lower extremity. Negative nerve root tension signs. Significant right posterior gluteal pain posterior  thigh pain. Positive dysesthesias in the right lower extremity. Negative Babinski test, no clonus, 2+ symmetrical deep tendon reflexes at the knee and Achilles  Musculoskeletal: Moderate to significant back pain with palpation and extension. Relief with forward flexion. No SI joint pain with direct palpation. Pain does radiate from his back into the right lower extremity. No significant hip, knee, ankle pain with isolated joint range of motion  MRI dated 12/03/17: Severe spinal stenosis with a degenerative grade 1 slip at L4-5. Mild stenosis L3-4, L2-3, and L1-2. L5-S1 no neural displacement, mild disc degeneration. No fracture or suspicious osseous lesion is identified.  MRI lumbar spine 06/29/18: Severe lumbar spinal stenosis L4-5 with grade 1 spondylolisthesis. Does not appear to be significantly worse than his prior 12/03/17 MRI.  Assessment:   The patient is a very active 85 yo male who is here today for a pre-operative History and Physical. They are scheduled for Decompression and posterior fusion L4-5 on 07-15-18 with Dr. Rolena Infante at [facility] To treat  his bilateral neuropathic buttock/thigh pain. Clinical exam is consistent with neurogenic claudication secondary to the spinal stenosis at L4-5.  #Degenerative spondylolithesis with spinal stensossis  Plan:   Surgical Plan: Clinical exam is consistent with neurogenic claudication secondary to the spinal stenosis at L4-5. At this point time given the severity of his symptoms I do believe a posterior decompression is warranted. I am concerned that the instability pattern could worsen and so I am recommending a posterior instrumented fusion with pedicle screws. This would provide the stability to prevent worsening of his slip  Risks and benefits of surgery were discussed with the patient. These include: Infection, bleeding, death, stroke, paralysis, ongoing or worse pain, need for additional surgery, leak of spinal fluid, adjacent segment  degeneration requiring additional surgery, post-operative hematoma formation that can result in neurological compromise and the need for urgent/emergent re-operation. Loss in bowel and bladder control. Injury to major vessels that could result in the need for urgent abdominal surgery to stop bleeding. Risk of deep venous thrombosis (DVT) and the need for additional treatment. Recurrent disc herniation resulting in the need for revision surgery, which could include fusion surgery (utilizing instrumentation such as pedicle screws and intervertebral cages). Additional risk: If instrumentation is used there is a risk of migration, or breakage of that hardware that could require additional surgery.  We have also discussed the goals of surgery to include: Goals of surgery: Reduction in pain, and improvement in quality of life.  We have also discussed the post-operative recovery period to include: bathing/showering restrictions, wound healing, activity (and driving) restrictions, medications/pain management.  We have also discussed post-operative red flags to include: signs and symptoms of postoperative infection, DVT/PE.  Plan: 1. We will move forward with Decompression and posterior fusion at L4/5 on 07-15-2018 with Dr. Rolena Infante at [facility]. 2. Per PCP clearance monitor for SVT (?telemetry floor?) and note CKD  F/U: 2 weeks s/p surgery

## 2018-07-12 NOTE — Anesthesia Preprocedure Evaluation (Addendum)
Anesthesia Evaluation  Patient identified by MRN, date of birth, ID band Patient awake    Reviewed: Allergy & Precautions, NPO status , Patient's Chart, lab work & pertinent test results  Airway Mallampati: II  TM Distance: >3 FB Neck ROM: Full    Dental no notable dental hx. (+) Edentulous Upper, Edentulous Lower   Pulmonary neg pulmonary ROS, former smoker,    Pulmonary exam normal breath sounds clear to auscultation       Cardiovascular hypertension, + CAD (nonobstructive) and + Peripheral Vascular Disease (73-53% LICA stenosis, 2992)  Normal cardiovascular exam+ dysrhythmias (s/p ablation 2016) Atrial Fibrillation and Supra Ventricular Tachycardia  Rhythm:Regular Rate:Normal  Event monitor 01/20/14: 2 episodes of symptomatic SVT. (Referred for ablation.)  Carotid US 11/01/[facility]): Impression: 1.  Minimal disease of the right carotid artery.  There is minimal evidence of heterogeneous plaque in the right carotid artery. 2.  Mild stenosis of the left distal internal carotid artery, mild internal carotid artery, proximal internal carotid artery and bulb (< 50% stenosis in the range of 16- 49%). -Follow-up in 1 year if clinically indicated.  Echo 03/19/11 (as outlined by [place] CV note): " Normal LVEF.  Mild to mod LVH, Grade II diastolic dysfunction.  Mild pulmonary HTN."  Cardiac cath 08/06/04: RESULTS: 1. Left main: Mild diffuse calcification with distal 20% to 30% tapering stenosis. 2. LAD: Ostial proximal 20% to 30% stenosis with mild mid and distal luminal irregularities. 3. LCX: Nondominant, ostial proximal 30% to 40% stenosis with mid anddistal luminal irregularities. 4. OM-1 and OM-2: Moderate size with mild luminal irregularities. 5. RCA: Dominant with proximal 20% to 30% stenosis followed by a mid-luminal irregularities and distal 40% stenosis. 6. PDA and PLV: Show moderate luminal  irregularities, but no significant obstructive disease. 7. LV: EF is 55% to 60% and no wall motion abnormalities. LVEDP is 46mmHg. IMPRESSION: 1. Nonobstructive coronary arteries. 2. Preserved left ventricular systolic function with an ejection fraction of 55% to 60%. PLAN: Aggressive risk factor modification   Neuro/Psych PSYCHIATRIC DISORDERS Dementia negative neurological ROS     GI/Hepatic Neg liver ROS, GERD  Medicated,  Endo/Other  negative endocrine ROS  Renal/GU Renal InsufficiencyRenal disease  negative genitourinary   Musculoskeletal negative musculoskeletal ROS (+)   Abdominal   Peds  Hematology negative hematology ROS (+)   Anesthesia Other Findings   Reproductive/Obstetrics                           Anesthesia Physical Anesthesia Plan  ASA: III  Anesthesia Plan: General   Post-op Pain Management:    Induction: Intravenous  PONV Risk Score and Plan: 2 and Ondansetron, Dexamethasone and Treatment may vary due to age or medical condition  Airway Management Planned: Oral ETT  Additional Equipment:   Intra-op Plan:   Post-operative Plan: Extubation in OR  Informed Consent: I have reviewed the patients History and Physical, chart, labs and discussed the procedure including the risks, benefits and alternatives for the proposed anesthesia with the patient or authorized representative who has indicated his/her understanding and acceptance.     Dental advisory given  Plan Discussed with: CRNA  Anesthesia Plan Comments:        Anesthesia Quick Evaluation

## 2018-07-15 ENCOUNTER — Encounter (HOSPITAL_COMMUNITY): Payer: Self-pay

## 2018-07-15 ENCOUNTER — Encounter (HOSPITAL_COMMUNITY)
Admission: RE | Disposition: A | Discharge status: Home / Self Care | Payer: Self-pay | Source: Home / Self Care | Attending: Orthopedic Surgery

## 2018-07-15 ENCOUNTER — Observation Stay (HOSPITAL_COMMUNITY)
Admission: RE | Admit: 2018-07-15 | Discharge: 2018-07-16 | Disposition: A | Discharge status: Home / Self Care | Payer: Medicare Other | Attending: Orthopedic Surgery | Admitting: Orthopedic Surgery

## 2018-07-15 ENCOUNTER — Inpatient Hospital Stay (HOSPITAL_COMMUNITY): Payer: Medicare Other | Admitting: Anesthesiology

## 2018-07-15 ENCOUNTER — Inpatient Hospital Stay (HOSPITAL_COMMUNITY): Payer: Medicare Other

## 2018-07-15 ENCOUNTER — Inpatient Hospital Stay (HOSPITAL_COMMUNITY): Payer: Medicare Other | Admitting: Emergency Medicine

## 2018-07-15 ENCOUNTER — Other Ambulatory Visit: Payer: Self-pay

## 2018-07-15 DIAGNOSIS — I2510 Atherosclerotic heart disease of native coronary artery without angina pectoris: Secondary | ICD-10-CM | POA: Diagnosis not present

## 2018-07-15 DIAGNOSIS — I471 Supraventricular tachycardia: Secondary | ICD-10-CM | POA: Insufficient documentation

## 2018-07-15 DIAGNOSIS — Z7982 Long term (current) use of aspirin: Secondary | ICD-10-CM | POA: Insufficient documentation

## 2018-07-15 DIAGNOSIS — E785 Hyperlipidemia, unspecified: Secondary | ICD-10-CM | POA: Diagnosis not present

## 2018-07-15 DIAGNOSIS — I4891 Unspecified atrial fibrillation: Secondary | ICD-10-CM | POA: Insufficient documentation

## 2018-07-15 DIAGNOSIS — Z419 Encounter for procedure for purposes other than remedying health state, unspecified: Secondary | ICD-10-CM

## 2018-07-15 DIAGNOSIS — I6523 Occlusion and stenosis of bilateral carotid arteries: Secondary | ICD-10-CM | POA: Diagnosis not present

## 2018-07-15 DIAGNOSIS — K219 Gastro-esophageal reflux disease without esophagitis: Secondary | ICD-10-CM | POA: Insufficient documentation

## 2018-07-15 DIAGNOSIS — I10 Essential (primary) hypertension: Secondary | ICD-10-CM | POA: Diagnosis not present

## 2018-07-15 DIAGNOSIS — M48061 Spinal stenosis, lumbar region without neurogenic claudication: Secondary | ICD-10-CM | POA: Diagnosis not present

## 2018-07-15 DIAGNOSIS — I129 Hypertensive chronic kidney disease with stage 1 through stage 4 chronic kidney disease, or unspecified chronic kidney disease: Secondary | ICD-10-CM | POA: Diagnosis not present

## 2018-07-15 DIAGNOSIS — Z79899 Other long term (current) drug therapy: Secondary | ICD-10-CM | POA: Insufficient documentation

## 2018-07-15 DIAGNOSIS — Z981 Arthrodesis status: Secondary | ICD-10-CM | POA: Diagnosis not present

## 2018-07-15 DIAGNOSIS — M5416 Radiculopathy, lumbar region: Secondary | ICD-10-CM | POA: Insufficient documentation

## 2018-07-15 DIAGNOSIS — M4316 Spondylolisthesis, lumbar region: Secondary | ICD-10-CM | POA: Insufficient documentation

## 2018-07-15 HISTORY — PX: LUMBAR LAMINECTOMY/DECOMPRESSION MICRODISCECTOMY: SHX5026

## 2018-07-15 SURGERY — LUMBAR LAMINECTOMY/DECOMPRESSION MICRODISCECTOMY 1 LEVEL
Anesthesia: General

## 2018-07-15 MED ORDER — SUGAMMADEX SODIUM 500 MG/5ML IV SOLN
INTRAVENOUS | Status: DC | PRN
  Administered 2018-07-15: 400 mg via INTRAVENOUS

## 2018-07-15 MED ORDER — ACETAMINOPHEN 10 MG/ML IV SOLN
INTRAVENOUS | Status: DC | PRN
  Administered 2018-07-15: 1000 mg via INTRAVENOUS

## 2018-07-15 MED ORDER — OXYCODONE HCL 5 MG PO TABS
5.0000 mg | ORAL_TABLET | ORAL | Status: DC | PRN
  Administered 2018-07-15: 5 mg via ORAL
  Filled 2018-07-15: qty 1

## 2018-07-15 MED ORDER — CEFAZOLIN SODIUM-DEXTROSE 2-4 GM/100ML-% IV SOLN
2.0000 g | INTRAVENOUS | Status: AC
  Administered 2018-07-15: 12:00:00 2 g via INTRAVENOUS
  Administered 2018-07-15: 1 g via INTRAVENOUS
  Filled 2018-07-15: qty 100

## 2018-07-15 MED ORDER — BUPIVACAINE-EPINEPHRINE 0.25% -1:200000 IJ SOLN
INTRAMUSCULAR | Status: DC | PRN
  Administered 2018-07-15: 10 mL

## 2018-07-15 MED ORDER — GABAPENTIN 300 MG PO CAPS
300.0000 mg | ORAL_CAPSULE | Freq: Three times a day (TID) | ORAL | Status: DC
  Administered 2018-07-15: 300 mg via ORAL
  Filled 2018-07-15 (×2): qty 1

## 2018-07-15 MED ORDER — ROCURONIUM BROMIDE 10 MG/ML (PF) SYRINGE
PREFILLED_SYRINGE | INTRAVENOUS | Status: AC
  Filled 2018-07-15: qty 10

## 2018-07-15 MED ORDER — AMLODIPINE BESYLATE 5 MG PO TABS: 10.0000 mg | ORAL_TABLET | Freq: Every day | ORAL | Status: DC

## 2018-07-15 MED ORDER — FENTANYL CITRATE (PF) 100 MCG/2ML IJ SOLN: 25.0000 ug | INTRAMUSCULAR | Status: DC | PRN

## 2018-07-15 MED ORDER — LIDOCAINE 2% (20 MG/ML) 5 ML SYRINGE
INTRAMUSCULAR | Status: AC
  Filled 2018-07-15: qty 10

## 2018-07-15 MED ORDER — TRANEXAMIC ACID-NACL 1000-0.7 MG/100ML-% IV SOLN
INTRAVENOUS | Status: AC
  Filled 2018-07-15: qty 100

## 2018-07-15 MED ORDER — FENTANYL CITRATE (PF) 100 MCG/2ML IJ SOLN
INTRAMUSCULAR | Status: DC | PRN
  Administered 2018-07-15: 100 ug via INTRAVENOUS
  Administered 2018-07-15 (×6): 50 ug via INTRAVENOUS

## 2018-07-15 MED ORDER — TAMSULOSIN HCL 0.4 MG PO CAPS
0.4000 mg | ORAL_CAPSULE | Freq: Every day | ORAL | Status: DC
  Administered 2018-07-15: 0.4 mg via ORAL
  Filled 2018-07-15: qty 1

## 2018-07-15 MED ORDER — FENTANYL CITRATE (PF) 250 MCG/5ML IJ SOLN
INTRAMUSCULAR | Status: AC
  Filled 2018-07-15: qty 5

## 2018-07-15 MED ORDER — SODIUM CHLORIDE 0.9 % IV SOLN
INTRAVENOUS | Status: DC | PRN
  Administered 2018-07-15: 12:00:00 20 ug/min via INTRAVENOUS

## 2018-07-15 MED ORDER — LACTATED RINGERS IV SOLN
INTRAVENOUS | Status: DC
  Administered 2018-07-15 (×3): via INTRAVENOUS

## 2018-07-15 MED ORDER — SODIUM CHLORIDE 0.9% FLUSH: 3.0000 mL | INTRAVENOUS | Status: DC | PRN

## 2018-07-15 MED ORDER — GLYCOPYRROLATE PF 0.2 MG/ML IJ SOSY
PREFILLED_SYRINGE | INTRAMUSCULAR | Status: DC | PRN
  Administered 2018-07-15 (×2): .1 mg via INTRAVENOUS

## 2018-07-15 MED ORDER — 0.9 % SODIUM CHLORIDE (POUR BTL) OPTIME
TOPICAL | Status: DC | PRN
  Administered 2018-07-15: 1000 mL

## 2018-07-15 MED ORDER — ALBUMIN HUMAN 5 % IV SOLN
INTRAVENOUS | Status: DC | PRN
  Administered 2018-07-15: 15:00:00 via INTRAVENOUS

## 2018-07-15 MED ORDER — MENTHOL 3 MG MT LOZG: 1.0000 | LOZENGE | OROMUCOSAL | Status: DC | PRN

## 2018-07-15 MED ORDER — SUGAMMADEX SODIUM 500 MG/5ML IV SOLN
INTRAVENOUS | Status: AC
  Filled 2018-07-15: qty 5

## 2018-07-15 MED ORDER — ONDANSETRON HCL 4 MG PO TABS: 4.0000 mg | ORAL_TABLET | Freq: Four times a day (QID) | ORAL | Status: DC | PRN

## 2018-07-15 MED ORDER — PROPOFOL 10 MG/ML IV BOLUS
INTRAVENOUS | Status: DC | PRN
  Administered 2018-07-15: 110 mg via INTRAVENOUS

## 2018-07-15 MED ORDER — PROPOFOL 500 MG/50ML IV EMUL
INTRAVENOUS | Status: DC | PRN
  Administered 2018-07-15: 50 ug/kg/min via INTRAVENOUS

## 2018-07-15 MED ORDER — THROMBIN (RECOMBINANT) 20000 UNITS EX SOLR
CUTANEOUS | Status: AC
  Filled 2018-07-15: qty 20000

## 2018-07-15 MED ORDER — ONDANSETRON HCL 4 MG/2ML IJ SOLN
INTRAMUSCULAR | Status: DC | PRN
  Administered 2018-07-15: 4 mg via INTRAVENOUS

## 2018-07-15 MED ORDER — METHOCARBAMOL 500 MG PO TABS: 500.0000 mg | ORAL_TABLET | Freq: Three times a day (TID) | ORAL | 0 refills | Status: AC | PRN

## 2018-07-15 MED ORDER — ONDANSETRON HCL 4 MG/2ML IJ SOLN
INTRAMUSCULAR | Status: AC
  Filled 2018-07-15: qty 2

## 2018-07-15 MED ORDER — ACETAMINOPHEN 325 MG PO TABS: 650.0000 mg | ORAL_TABLET | ORAL | Status: DC | PRN

## 2018-07-15 MED ORDER — DEXAMETHASONE SODIUM PHOSPHATE 10 MG/ML IJ SOLN
INTRAMUSCULAR | Status: DC | PRN
  Administered 2018-07-15: 10 mg via INTRAVENOUS

## 2018-07-15 MED ORDER — ONDANSETRON HCL 4 MG PO TABS: 4.0000 mg | ORAL_TABLET | Freq: Three times a day (TID) | ORAL | 0 refills | Status: DC | PRN

## 2018-07-15 MED ORDER — THROMBIN 20000 UNITS EX SOLR
CUTANEOUS | Status: DC | PRN
  Administered 2018-07-15: 20 mL via TOPICAL

## 2018-07-15 MED ORDER — PROPOFOL 10 MG/ML IV BOLUS
INTRAVENOUS | Status: AC
  Filled 2018-07-15: qty 40

## 2018-07-15 MED ORDER — PHENOL 1.4 % MT LIQD: 1.0000 | OROMUCOSAL | Status: DC | PRN

## 2018-07-15 MED ORDER — DEXAMETHASONE SODIUM PHOSPHATE 10 MG/ML IJ SOLN
INTRAMUSCULAR | Status: AC
  Filled 2018-07-15: qty 1

## 2018-07-15 MED ORDER — ONDANSETRON HCL 4 MG/2ML IJ SOLN: 4.0000 mg | Freq: Four times a day (QID) | INTRAMUSCULAR | Status: DC | PRN

## 2018-07-15 MED ORDER — LACTATED RINGERS IV SOLN: INTRAVENOUS | Status: DC

## 2018-07-15 MED ORDER — OXYCODONE-ACETAMINOPHEN 10-325 MG PO TABS: 1.0000 | ORAL_TABLET | Freq: Four times a day (QID) | ORAL | 0 refills | Status: AC | PRN

## 2018-07-15 MED ORDER — OXYCODONE HCL 5 MG PO TABS
10.0000 mg | ORAL_TABLET | ORAL | Status: DC | PRN
  Administered 2018-07-16 (×2): 10 mg via ORAL
  Filled 2018-07-15 (×2): qty 2

## 2018-07-15 MED ORDER — HEMOSTATIC AGENTS (NO CHARGE) OPTIME
TOPICAL | Status: DC | PRN
  Administered 2018-07-15 (×3): 1 via TOPICAL

## 2018-07-15 MED ORDER — ATORVASTATIN CALCIUM 10 MG PO TABS
20.0000 mg | ORAL_TABLET | Freq: Every evening | ORAL | Status: DC
  Administered 2018-07-15: 20 mg via ORAL
  Filled 2018-07-15: qty 2

## 2018-07-15 MED ORDER — CEFAZOLIN SODIUM-DEXTROSE 1-4 GM/50ML-% IV SOLN
1.0000 g | Freq: Three times a day (TID) | INTRAVENOUS | Status: AC
  Administered 2018-07-15 – 2018-07-16 (×2): 1 g via INTRAVENOUS
  Filled 2018-07-15 (×2): qty 50

## 2018-07-15 MED ORDER — TRANEXAMIC ACID-NACL 1000-0.7 MG/100ML-% IV SOLN
INTRAVENOUS | Status: DC | PRN
  Administered 2018-07-15: 1000 mg via INTRAVENOUS

## 2018-07-15 MED ORDER — ROCURONIUM BROMIDE 10 MG/ML (PF) SYRINGE
PREFILLED_SYRINGE | INTRAVENOUS | Status: DC | PRN
  Administered 2018-07-15: 70 mg via INTRAVENOUS

## 2018-07-15 MED ORDER — LIDOCAINE 2% (20 MG/ML) 5 ML SYRINGE
INTRAMUSCULAR | Status: DC | PRN
  Administered 2018-07-15: 100 mg via INTRAVENOUS

## 2018-07-15 MED ORDER — ACETAMINOPHEN 500 MG PO TABS
1000.0000 mg | ORAL_TABLET | Freq: Once | ORAL | Status: AC
  Administered 2018-07-15: 1000 mg via ORAL

## 2018-07-15 MED ORDER — BUPIVACAINE-EPINEPHRINE (PF) 0.25% -1:200000 IJ SOLN
INTRAMUSCULAR | Status: AC
  Filled 2018-07-15: qty 30

## 2018-07-15 MED ORDER — PANTOPRAZOLE SODIUM 40 MG PO TBEC
40.0000 mg | DELAYED_RELEASE_TABLET | Freq: Every day | ORAL | Status: DC
  Administered 2018-07-15: 40 mg via ORAL
  Filled 2018-07-15: qty 1

## 2018-07-15 MED ORDER — METHOCARBAMOL 500 MG PO TABS
500.0000 mg | ORAL_TABLET | Freq: Four times a day (QID) | ORAL | Status: DC | PRN
  Administered 2018-07-16: 500 mg via ORAL
  Filled 2018-07-15: qty 1

## 2018-07-15 MED ORDER — MORPHINE SULFATE (PF) 2 MG/ML IV SOLN: 2.0000 mg | INTRAVENOUS | Status: DC | PRN

## 2018-07-15 MED ORDER — EPHEDRINE SULFATE 50 MG/ML IJ SOLN
INTRAMUSCULAR | Status: DC | PRN
  Administered 2018-07-15 (×2): 10 mg via INTRAVENOUS

## 2018-07-15 MED ORDER — METHOCARBAMOL 1000 MG/10ML IJ SOLN
500.0000 mg | Freq: Four times a day (QID) | INTRAVENOUS | Status: DC | PRN
  Filled 2018-07-15: qty 5

## 2018-07-15 MED ORDER — SODIUM CHLORIDE 0.9% FLUSH
3.0000 mL | Freq: Two times a day (BID) | INTRAVENOUS | Status: DC
  Administered 2018-07-15: 3 mL via INTRAVENOUS

## 2018-07-15 MED ORDER — ACETAMINOPHEN 650 MG RE SUPP: 650.0000 mg | RECTAL | Status: DC | PRN

## 2018-07-15 MED ORDER — LIDOCAINE 2% (20 MG/ML) 5 ML SYRINGE
INTRAMUSCULAR | Status: AC
  Filled 2018-07-15: qty 5

## 2018-07-15 SURGICAL SUPPLY — 80 items
BNDG GAUZE ELAST 4 BULKY (GAUZE/BANDAGES/DRESSINGS) ×3 IMPLANT
BUR EGG ELITE 4.0 (BURR) IMPLANT
BUR EGG ELITE 4.0MM (BURR)
BUR MATCHSTICK NEURO 3.0 LAGG (BURR) IMPLANT
CANISTER SUCT 3000ML PPV (MISCELLANEOUS) ×3 IMPLANT
CLIP NEUROVISION LG (CLIP) ×2 IMPLANT
CLOSURE STERI-STRIP 1/2X4 (GAUZE/BANDAGES/DRESSINGS) ×1
CLSR STERI-STRIP ANTIMIC 1/2X4 (GAUZE/BANDAGES/DRESSINGS) ×2 IMPLANT
CORD BI POLAR (MISCELLANEOUS) ×3 IMPLANT
COVER SURGICAL LIGHT HANDLE (MISCELLANEOUS) ×3 IMPLANT
COVER WAND RF STERILE (DRAPES) ×3 IMPLANT
DRAIN CHANNEL 15F RND FF W/TCR (WOUND CARE) IMPLANT
DRAPE POUCH INSTRU U-SHP 10X18 (DRAPES) ×3 IMPLANT
DRAPE SURG 17X23 STRL (DRAPES) ×3 IMPLANT
DRAPE U-SHAPE 47X51 STRL (DRAPES) ×3 IMPLANT
DRSG OPSITE POSTOP 3X4 (GAUZE/BANDAGES/DRESSINGS) ×3 IMPLANT
DRSG OPSITE POSTOP 4X6 (GAUZE/BANDAGES/DRESSINGS) ×2 IMPLANT
DURAPREP 26ML APPLICATOR (WOUND CARE) ×3 IMPLANT
ELECT BLADE 4.0 EZ CLEAN MEGAD (MISCELLANEOUS)
ELECT CAUTERY BLADE 6.4 (BLADE) ×3 IMPLANT
ELECT PENCIL ROCKER SW 15FT (MISCELLANEOUS) ×3 IMPLANT
ELECT REM PT RETURN 9FT ADLT (ELECTROSURGICAL) ×3
ELECTRODE BLDE 4.0 EZ CLN MEGD (MISCELLANEOUS) IMPLANT
ELECTRODE REM PT RTRN 9FT ADLT (ELECTROSURGICAL) ×1 IMPLANT
EVACUATOR SILICONE 100CC (DRAIN) IMPLANT
FLOSEAL 10ML (HEMOSTASIS) IMPLANT
GLOVE BIO SURGEON STRL SZ 6.5 (GLOVE) ×2 IMPLANT
GLOVE BIO SURGEONS STRL SZ 6.5 (GLOVE) ×1
GLOVE BIOGEL PI IND STRL 6.5 (GLOVE) ×1 IMPLANT
GLOVE BIOGEL PI IND STRL 8.5 (GLOVE) ×1 IMPLANT
GLOVE BIOGEL PI INDICATOR 6.5 (GLOVE) ×2
GLOVE BIOGEL PI INDICATOR 8.5 (GLOVE) ×2
GLOVE SS BIOGEL STRL SZ 8.5 (GLOVE) ×1 IMPLANT
GLOVE SUPERSENSE BIOGEL SZ 8.5 (GLOVE) ×2
GOWN STRL REUS W/ TWL LRG LVL3 (GOWN DISPOSABLE) ×2 IMPLANT
GOWN STRL REUS W/TWL 2XL LVL3 (GOWN DISPOSABLE) ×3 IMPLANT
GOWN STRL REUS W/TWL LRG LVL3 (GOWN DISPOSABLE) ×6
GUIDEWIRE NITINOL BEVEL TIP (WIRE) ×8 IMPLANT
KIT BASIN OR (CUSTOM PROCEDURE TRAY) ×3 IMPLANT
KIT TURNOVER KIT B (KITS) ×3 IMPLANT
MATRIX HEMOSTAT SURGIFLO (HEMOSTASIS) ×4 IMPLANT
MODULE EMG NDL SSEP NVM5 (NEEDLE) IMPLANT
MODULE EMG NEEDLE SSEP NVM5 (NEEDLE) ×3 IMPLANT
MODULE NVM5 NEXT GEN EMG (NEEDLE) ×2 IMPLANT
NDL I-PASS III (NEEDLE) IMPLANT
NDL SPNL 18GX3.5 QUINCKE PK (NEEDLE) ×2 IMPLANT
NEEDLE 22X1 1/2 (OR ONLY) (NEEDLE) ×3 IMPLANT
NEEDLE I-PASS III (NEEDLE) ×3 IMPLANT
NEEDLE SPNL 18GX3.5 QUINCKE PK (NEEDLE) ×6 IMPLANT
NS IRRIG 1000ML POUR BTL (IV SOLUTION) ×3 IMPLANT
PACK LAMINECTOMY ORTHO (CUSTOM PROCEDURE TRAY) ×3 IMPLANT
PACK UNIVERSAL I (CUSTOM PROCEDURE TRAY) ×3 IMPLANT
PAD ARMBOARD 7.5X6 YLW CONV (MISCELLANEOUS) ×6 IMPLANT
PATTIES SURGICAL .25X.25 (GAUZE/BANDAGES/DRESSINGS) ×4 IMPLANT
PATTIES SURGICAL .5 X.5 (GAUZE/BANDAGES/DRESSINGS) ×3 IMPLANT
PATTIES SURGICAL .5 X1 (DISPOSABLE) ×3 IMPLANT
PROBE BALL TIP NVM5 SNG USE (BALLOONS) ×2 IMPLANT
PUTTY DBX 10CC (Bone Implant) ×2 IMPLANT
REDUCTION EXT RELINE MAS MOD (Neuro Prosthesis/Implant) ×2 IMPLANT
ROD RELINE MAS TI 5.5X35MM LRD (Rod) ×2 IMPLANT
ROD RELINE MAS TI LORD 5.5X40 (Rod) ×2 IMPLANT
SCREW LOCK RELINE 5.5 TULIP (Screw) ×8 IMPLANT
SCREW RELINE RED 6.5X45MM POLY (Screw) ×6 IMPLANT
SCREW SHANK RELINE 6.5X45MM 2C (Screw) ×2 IMPLANT
SPONGE LAP 4X18 RFD (DISPOSABLE) ×8 IMPLANT
SPONGE SURGIFOAM ABS GEL 100 (HEMOSTASIS) IMPLANT
SUT BONE WAX W31G (SUTURE) ×3 IMPLANT
SUT MON AB 3-0 SH 27 (SUTURE) ×3
SUT MON AB 3-0 SH27 (SUTURE) ×1 IMPLANT
SUT VIC AB 0 CT1 27 (SUTURE)
SUT VIC AB 0 CT1 27XBRD ANBCTR (SUTURE) IMPLANT
SUT VIC AB 1 CT1 18XCR BRD 8 (SUTURE) ×1 IMPLANT
SUT VIC AB 1 CT1 8-18 (SUTURE) ×3
SUT VIC AB 2-0 CT1 18 (SUTURE) ×3 IMPLANT
SYR BULB IRRIGATION 50ML (SYRINGE) ×3 IMPLANT
SYR CONTROL 10ML LL (SYRINGE) ×3 IMPLANT
TOWEL GREEN STERILE (TOWEL DISPOSABLE) ×3 IMPLANT
TOWEL GREEN STERILE FF (TOWEL DISPOSABLE) ×3 IMPLANT
WATER STERILE IRR 1000ML POUR (IV SOLUTION) ×3 IMPLANT
YANKAUER SUCT BULB TIP NO VENT (SUCTIONS) IMPLANT

## 2018-07-15 NOTE — H&P (Signed)
Addendum dictation  No change in the patient's clinical exam from his last office visit on 07/12/2018.  He continues to have significant back and primarily right posterior gluteal and thigh pain.  Clinical exam consistent with lumbar spinal stenosis secondary to his spondylolisthesis.  Surgical plan is a lumbar decompression with instrumented fusion to prevent worsening of the spondylolisthesis.  Risks and benefits were discussed with the patient and his wife and all their questions were addressed.

## 2018-07-15 NOTE — Op Note (Signed)
Operative report  Preoperative diagnosis: Degenerative spondylolisthesis L4-5 with lumbar spinal stenosis and radicular right leg pain  Postoperative diagnosis: Same  Operative procedure: L4-5 posterior bilateral Gill decompression.  Posterior instrumented fusion with pedicle screw rod construct, posterior lateral arthrodesis L4-5  First Assistant: Cleta Alberts, PA  Complications: None  Implants: NuVasive pedicle screw system.  6.5 x 45 mm length screws.  40 mm length rod on the right side, 35 mm length rod on the left side.  Allograft: DBX  Autograft: Local bone from decompression  Neuro monitoring: All pedicle screws were independently tested and there was no adverse response that greater than 40 mA.  Noticed abnormal EMG or SSEP activity throughout the case.  Indications: Rashard is a very pleasant 85 year old gentleman presented my office with significant back buttock and neuropathic right leg pain.  Imaging studies demonstrated grade 1 borderline to degenerative spondylolisthesis at L4-5 with significant central and lateral recess stenosis.  Attempts at conservative management had failed to alleviate his symptoms and his quality of life was significantly hindered.  As a result we elected to proceed with surgery.  All appropriate risks benefits and alternatives were discussed with the patient and consent was obtained.  Operative note patient is brought the operating room placed upon the operating room table.  After successful induction of general anesthesia and endotracheal intubation teds SCDs and a Foley were inserted.  The neuro monitoring representative placed the appropriate needles and the patient was turned prone onto the Wilson frame.  All bony prominences well-padded and the back was prepped and draped in a standard fashion.  Timeout was taken to confirm patient procedure and all other important data.  X-ray was used to identify the L4 and L5 pedicles and I marked out a midline  incision.  I infiltrated this with quarter percent Marcaine and then made a midline incision.  Sharp dissection was carried out down to the deep fascia.  I incised the deep fascia and then stripped the paraspinal muscles to expose the spinous processes of L4 and L5.  I continued out laterally to expose the L3-4 and L4-5 facet complexes and the L4 and L5 transverse processes.  Using a Bovie I resected the facet capsule from the L4-5 facet complex.  I used intraoperative fluoroscopy to confirm that I was at the appropriate level.  At this point with the retractors in place I proceeded with the decompression.  I remove the majority of the L4 spinous process and the superior portion of the L5.  There was significant overlap of the posterior elements because of the spondylolisthesis.  Great care was taken performing a decompression.  Is quite difficult to access the central region and so I elected to use a osteotome to resect the L4 inferior facets.  Once this was removed I could then develop a plane between the L4 lamina and the ligamentum flavum.  Using my 2 and 3 mm punch I performed a L4 laminotomy.   With bilateral hemilaminotomies completed I then began to dissect with the Penfield 4 in the midline.  There was significant thickening of the ligamentum flavum which made dissecting into the central raphae of the ligamentum flavum quite difficult after some time was able to develop a plane between the thecal sac and the ligamentum flavum.  I then used my 2 mm Kerrison punch to resect the ligamentum flavum and exposed the underlying thecal sac.  There was significant stenosis centrally and in the lateral recess bilaterally.  Careful dissection with the 2  mm Kerrison along with the Penfield 4 allowed me to completely decompress the central region.  I then proceeded to identify the L5 nerve root and gently retracted and protected with neuro patties.  Once this was done I was able to get under th medial aspect of the  L5 facet and resect the medial overhanging osteophyte to complete the lateral recess decompression.  I was then able to trace the L5 nerve root into the foramen.  I can now directly visualize the L5 nerve root from its takeoff into the lateral recess and into the foramen.  Once I did adequately decompressed I then went to the contralateral side and performed a similar maneuver.  Once I had a bilateral central and lateral recess and foraminal decompression I used my Woodson elevator to confirm that I could freely palpate superiorly inferiorly medially and out the foramen bilaterally.  At this point I was pleased with the decompression.  Both the L5 nerve roots were adequately decompressed in the lateral recess and foramen were adequately decompressed.  At this point I then turned my attention to the fixation.  Using standard technique a Jamshidi needle was placed on the lateral aspect of the facet of L3-4 and then I advanced the Jamshidi needle using direct stimulation with neuro monitoring as well as fluoroscopic guidance into the L3-4 pedicle.  Once I was nearing the medial wall of the pedicle I switched my view to the lateral to confirm trajectory and position.  I then advanced the Jamshidi needle into the L4 vertebral body and then placed the guidepin.  With the pedicle cannulated I then proceeded to the next pedicle.  Using the exact same technique I cannulated the L5 pedicle and the contralateral L4 and L5 pedicles.  Once all 4 pedicles were cannulated I then measured and then placed the self drilling/tapping pedicle screws at each level.  I utilized the 6.5 x 45 mm length screws at all pedicles.  All screws were placed and had excellent purchase.  I then directly stimulated each pedicle and there is no adverse activity at greater than 40 mA.  At this point I then used my interlaminar spreader to further distract the L4-5 space thereby improving the overall alignment.  At this point I then measured and  placed the appropriate size rod and locked it into place.  With the pedicle screw rod construct secured I remove the interlaminar spreader and the x-ray demonstrated complete reduction of the degenerative slip.  I then torqued off all 4 locking caps according manufacture standards and took final intraoperative x-rays.  The AP and lateral planes were satisfactory I had reduction of the spondylolisthesis and excellent overall hardware position.  Posterior lateral gutter was then prepared with a curette and packed with the local bone that harvested from the decompression along with DBX.  Once the lateral gutters were packed I then rechecked the decompression.  There was still some slight areas of concern and so using a 1 mm Kerrison Roger I removed small spikes of bone.  But again the L5 nerve root was completely decompressed in the lateral recess and into the foramen and the central stenosis that was seen on the preoperative MRI had been completely addressed.  The overall construct was quite stable.  FloSeal and bipolar electrocautery I obtained and maintain hemostasis.  After hemostasis was achieved I copiously irrigated the wound with normal saline and then placed a thrombin-soaked Gelfoam patty over the exposed dura.  Retractors were removed and I closed  the deep fascia with interrupted #1 Vicryl sutures then at interrupted 2-0 Vicryl sutures and 3-0 Monocryl.  During the closure there was some areas of concern on the skin secondary to the compression from the retractors.  I used a 15 blade scalpel to remove and fraction the wound edges.  Once I had healthy bleeding wound edges I then closed the fascia with interrupted 2-0 Vicryl suture.  And then the skin was closed with 3-0 Monocryl.  Steri-Strips and a dry dressing were applied and the patient was ultimately extubated and transferred the PACU without incident.  The end of the case all needle sponge counts were correct.  There were no adverse intraoperative  events

## 2018-07-15 NOTE — Discharge Instructions (Signed)

## 2018-07-15 NOTE — Brief Op Note (Signed)
07/15/2018  5:27 PM  PATIENT:  Mallie Mussel  85 y.o. male  PRE-OPERATIVE DIAGNOSIS:  Degenerative spondylothesis with spinal stenosis  POST-OPERATIVE DIAGNOSIS:  Degenerative spondylothesis with spinal stenosis  PROCEDURE:  Procedure(s) with comments: Posterior lumbar decompression and fusion L4-5 (N/A) - 4 hrs  SURGEON:  Surgeon(s) and Role:    Melina Schools, MD - Primary  PHYSICIAN ASSISTANT:   ASSISTANTS: Amanda Ward, PA   ANESTHESIA:   general  EBL:  300 mL   BLOOD ADMINISTERED:none  DRAINS: none   LOCAL MEDICATIONS USED:  MARCAINE     SPECIMEN:  No Specimen  DISPOSITION OF SPECIMEN:  N/A  COUNTS:  YES  TOURNIQUET:  * No tourniquets in log *  DICTATION: .Dragon Dictation  PLAN OF CARE: Admit to inpatient   PATIENT DISPOSITION:  PACU - hemodynamically stable.

## 2018-07-15 NOTE — Anesthesia Procedure Notes (Signed)
Procedure Name: Intubation Date/Time: 07/15/2018 11:28 AM Performed by: Scheryl Darter, CRNA Pre-anesthesia Checklist: Patient identified, Emergency Drugs available, Suction available and Patient being monitored Patient Re-evaluated:Patient Re-evaluated prior to induction Oxygen Delivery Method: Circle System Utilized Preoxygenation: Pre-oxygenation with 100% oxygen Induction Type: IV induction Ventilation: Mask ventilation without difficulty Laryngoscope Size: Miller and 2 Grade View: Grade I Tube type: Oral Tube size: 7.5 mm Number of attempts: 1 Airway Equipment and Method: Stylet and Oral airway Placement Confirmation: ETT inserted through vocal cords under direct vision,  positive ETCO2 and breath sounds checked- equal and bilateral Secured at: 21 cm Tube secured with: Tape Dental Injury: Teeth and Oropharynx as per pre-operative assessment

## 2018-07-15 NOTE — Transfer of Care (Signed)
Immediate Anesthesia Transfer of Care Note  Patient: Brandon Fischer  Procedure(s) Performed: Posterior lumbar decompression and fusion L4-5 (N/A )  Patient Location: PACU  Anesthesia Type:General  Level of Consciousness: drowsy and patient cooperative  Airway & Oxygen Therapy: Patient Spontanous Breathing  Post-op Assessment: Report given to RN and Post -op Vital signs reviewed and stable  Post vital signs: Reviewed and stable  Last Vitals:  Vitals Value Taken Time  BP 144/91 07/15/18 1753  Temp    Pulse 83 07/15/18 1754  Resp 13 07/15/18 1754  SpO2 99 % 07/15/18 1754  Vitals shown include unvalidated device data.  Last Pain:  Vitals:   07/15/18 0947  TempSrc:   PainSc: 0-No pain      Patients Stated Pain Goal: 0 (37/16/96 7893)  Complications: No apparent anesthesia complications

## 2018-07-16 DIAGNOSIS — M48061 Spinal stenosis, lumbar region without neurogenic claudication: Secondary | ICD-10-CM | POA: Diagnosis not present

## 2018-07-16 DIAGNOSIS — I4891 Unspecified atrial fibrillation: Secondary | ICD-10-CM | POA: Diagnosis not present

## 2018-07-16 DIAGNOSIS — M5416 Radiculopathy, lumbar region: Secondary | ICD-10-CM | POA: Diagnosis not present

## 2018-07-16 DIAGNOSIS — M4316 Spondylolisthesis, lumbar region: Secondary | ICD-10-CM | POA: Diagnosis not present

## 2018-07-16 DIAGNOSIS — I129 Hypertensive chronic kidney disease with stage 1 through stage 4 chronic kidney disease, or unspecified chronic kidney disease: Secondary | ICD-10-CM | POA: Diagnosis not present

## 2018-07-16 DIAGNOSIS — K219 Gastro-esophageal reflux disease without esophagitis: Secondary | ICD-10-CM | POA: Diagnosis not present

## 2018-07-16 MED ORDER — ALUM & MAG HYDROXIDE-SIMETH 200-200-20 MG/5ML PO SUSP
30.0000 mL | Freq: Four times a day (QID) | ORAL | Status: DC | PRN
  Administered 2018-07-16: 30 mL via ORAL
  Filled 2018-07-16: qty 30

## 2018-07-16 NOTE — Care Management CC44 (Addendum)
Condition Code 44 Documentation Completed  Patient Details  Name: JASYN MEY MRN: [id number] Date of Birth: August 28, 1933   Condition Code 44 given:  Yes Patient signature on Condition Code 44 notice:  (case manager signed, working remotely [place] provided) Documentation of 2 MD's agreement: Yes   Code 44 added to claim:  Yes    Ninfa Meeker, RN 07/16/2018, 10:25 AM

## 2018-07-16 NOTE — Evaluation (Signed)
Occupational Therapy Evaluation Patient Details Name: Brandon Fischer MRN: [id number] DOB: 1933/09/07 Today's Date: 07/16/2018    History of Present Illness Patient is a 85 y/o male who presents s/p L4-5 PLIF. PMH includes HTN, HLD, CAD, CKD, A-fib.   Clinical Impression   This 85 y/o male presents with the above. PTA pt reports he is independent with ADL, iADL and functional mobility. Pt performing functional mobility using RW with minguard assist this session. He currently requires minA for seated UB ADL, modA for LB ADL secondary to adhering to back precautions. Pt reports he will return home with spouse who is able to assist with ADL/iADL PRN. Educated pt re: back precautions, AE, safety and compensatory strategies for performing ADL and functional transfers with pt verbalizing understanding. Will continue to follow while he remains in acute setting to maximize his safety and independence with ADL and mobility.     Follow Up Recommendations  No OT follow up;Supervision/Assistance - 24 hour    Equipment Recommendations  None recommended by OT(pt's DME needs are met)           Precautions / Restrictions Precautions Precautions: Back Precaution Booklet Issued: Yes (comment) Precaution Comments: Reviewed back precautions and handout. Required Braces or Orthoses: Spinal Brace Spinal Brace: Lumbar corset;Applied in sitting position Restrictions Weight Bearing Restrictions: No      Mobility Bed Mobility Overal bed mobility: Needs Assistance Bed Mobility: Rolling;Sidelying to Sit Rolling: Supervision Sidelying to sit: Supervision       General bed mobility comments: pt received sitting EOB  Transfers Overall transfer level: Needs assistance Equipment used: Rolling walker (2 wheeled) Transfers: Sit to/from Stand Sit to Stand: Min guard;Supervision         General transfer comment: for safety/balance, pt with good recall of safe hand placement    Balance Overall  balance assessment: Needs assistance Sitting-balance support: Feet supported;No upper extremity supported Sitting balance-Leahy Scale: Good Sitting balance - Comments: Able to donn brace with setup   Standing balance support: During functional activity Standing balance-Leahy Scale: Poor Standing balance comment: Requires UE support in standing during functional tasks/walking                           ADL either performed or assessed with clinical judgement   ADL Overall ADL's : Needs assistance/impaired Eating/Feeding: Modified independent;Sitting   Grooming: Min guard;Standing   Upper Body Bathing: Set up;Sitting   Lower Body Bathing: Minimal assistance;Sit to/from stand Lower Body Bathing Details (indicate cue type and reason): educated to use shower chair for LB bathing to increase safety and for increased ability to adhere to back precautions Upper Body Dressing : Minimal assistance;Sitting;Standing Upper Body Dressing Details (indicate cue type and reason): minA for brace management, min cues for proper donning technique Lower Body Dressing: Moderate assistance;Sit to/from stand;With adaptive equipment Lower Body Dressing Details (indicate cue type and reason): minguard standing balance; increased assist due to difficulty accessing LEs while maintaining back precautions, educated on use of reacher for LB dressing with pt return demonstrating, assist for socks/shoes; pt reports spouse can assist with LB ADL tasks Toilet Transfer: Min guard;Ambulation;RW Toilet Transfer Details (indicate cue type and reason): simulated via transfer to/from EOB Toileting- Clothing Manipulation and Hygiene: Minimal assistance;Sit to/from stand       Functional mobility during ADLs: Min guard;Rolling walker General ADL Comments: educated in back precautions, safety, AE and compensatory techniques for performing ADL and functional transfers  Pertinent  Vitals/Pain Pain Assessment: No/denies pain     Hand Dominance     Extremity/Trunk Assessment Upper Extremity Assessment Upper Extremity Assessment: Overall WFL for tasks assessed   Lower Extremity Assessment Lower Extremity Assessment: Defer to PT evaluation   Cervical / Trunk Assessment Cervical / Trunk Assessment: Other exceptions Cervical / Trunk Exceptions: s/p back surgery   Communication Communication Communication: HOH   Cognition Arousal/Alertness: Awake/alert Behavior During Therapy: WFL for tasks assessed/performed Overall Cognitive Status: Within Functional Limits for tasks assessed                                     General Comments  Incision clean dry and intact.    Exercises     Shoulder Instructions      Home Living Family/patient expects to be discharged to:: Private residence Living Arrangements: Spouse/significant other Available Help at Discharge: Family;Available 24 hours/day Type of Home: House Home Access: Stairs to enter [employer] of Steps: a few tiny steps on the porch Entrance Stairs-Rails: Right Home Layout: One level     Bathroom Shower/Tub: Occupational psychologist: Handicapped height     Home Equipment: Environmental consultant - 2 wheels;Cane - single point;Shower seat          Prior Functioning/Environment Level of Independence: Independent        Comments: Drives, likes to mow the lawn.        OT Problem List: Decreased strength;Impaired balance (sitting and/or standing);Decreased activity tolerance;Decreased range of motion;Decreased knowledge of use of DME or AE;Decreased knowledge of precautions      OT Treatment/Interventions: Self-care/ADL training;Therapeutic exercise;Energy conservation;DME and/or AE instruction;Therapeutic activities;Patient/family education;Balance training    OT Goals(Current goals can be found in the care plan section) Acute Rehab OT Goals Patient Stated Goal: to go  home today OT Goal Formulation: With patient Time For Goal Achievement: 07/30/18 Potential to Achieve Goals: Good  OT Frequency: Min 2X/week   Barriers to D/C:            Co-evaluation              AM-PAC OT "6 Clicks" Daily Activity     Outcome Measure Help from another person eating meals?: None Help from another person taking care of personal grooming?: None Help from another person toileting, which includes using toliet, bedpan, or urinal?: A Little Help from another person bathing (including washing, rinsing, drying)?: A Little Help from another person to put on and taking off regular upper body clothing?: A Little Help from another person to put on and taking off regular lower body clothing?: A Lot 6 Click Score: 19   End of Session Equipment Utilized During Treatment: Rolling walker;Back brace Nurse Communication: Mobility status  Activity Tolerance: Patient tolerated treatment well Patient left: with call bell/phone within reach;Other (comment)(seated EOB)  OT Visit Diagnosis: Other abnormalities of gait and mobility (R26.89)                Time: 3875-6433 OT Time Calculation (min): 23 min Charges:  OT General Charges $OT Visit: 1 Visit OT Evaluation $OT Eval Low Complexity: 1 Low OT Treatments $Self Care/Home Management : 8-22 mins  Lou Cal, OT [facility] Pager [phone] Office [phone]   Raymondo Band 07/16/2018, 9:07 AM

## 2018-07-16 NOTE — Progress Notes (Signed)
Patient is discharged from room 3C10 at this time. Alert and in stable condition. IV site d/c'd and instructions read to patient with understanding verbalized. Left unit via wheelchair with all belongings at side. 

## 2018-07-16 NOTE — Progress Notes (Signed)
   Subjective:  Patient reports pain as mild.  Up and ready to go home.  No complaints.  Objective:   VITALS:   Vitals:   07/15/18 1903 07/15/18 2311 07/16/18 0512 07/16/18 0743  BP: (!) 178/80 137/77 (!) 153/79 136/70  Pulse: 80 85 99 95  Resp:  20 18 18   Temp:  98.8 F (37.1 C) 98.7 F (37.1 C) 98.4 F (36.9 C)  TempSrc:  Oral Oral Oral  SpO2: 99% 97% 100% 98%  Weight:      Height:        Neurologically intact Dorsiflexion/Plantar flexion intact Incision: dressing C/D/I   Lab Results  Component Value Date   WBC 7.5 07/08/2018   HGB 12.7 (L) 07/08/2018   HCT 40.1 07/08/2018   MCV 82.5 07/08/2018   PLT 188 07/08/2018   BMET    Component Value Date/Time   NA 139 07/08/2018 1450   NA 141 12/24/2016 1111   K 3.6 07/08/2018 1450   K 3.9 12/24/2016 1111   CL 105 07/08/2018 1450   CO2 22 07/08/2018 1450   CO2 26 12/24/2016 1111   GLUCOSE 110 (H) 07/08/2018 1450   GLUCOSE 109 12/24/2016 1111   BUN 16 07/08/2018 1450   BUN 10.9 12/24/2016 1111   CREATININE 1.49 (H) 07/08/2018 1450   CREATININE 2.07 (H) 04/29/2018 1137   CREATININE 1.1 12/24/2016 1111   CALCIUM 9.1 07/08/2018 1450   CALCIUM 8.8 12/24/2016 1111   GFRNONAA 43 (L) 07/08/2018 1450   GFRNONAA 29 (L) 04/29/2018 1137   GFRAA 50 (L) 07/08/2018 1450   GFRAA 33 (L) 04/29/2018 1137     Assessment/Plan: 1 Day Post-Op   Active Problems:   Spinal stenosis at L4-L5 level   Up with therapy - home today   Nicholes Stairs 07/16/2018, 8:52 AM   Geralynn Rile, MD [phone]

## 2018-07-16 NOTE — Care Management Obs Status (Signed)
[place] NOTIFICATION   Patient Details  Name: Brandon Fischer MRN: [id number] Date of Birth: 1933-08-27   Medicare Observation Status Notification Given:  Yes    Ninfa Meeker, RN 07/16/2018, 10:25 AM

## 2018-07-16 NOTE — Evaluation (Signed)
Physical Therapy Evaluation Patient Details Name: Brandon Fischer MRN: [id number] DOB: 06/23/1933 Today's Date: 07/16/2018   History of Present Illness  Patient is a 85 y/o male who presents s/p L4-5 PLIF. PMH includes HTN, HLD, CAD, CKD, A-fib.  Clinical Impression  Patient presents with post surgical deficits s/p above surgery. Pt independent PTA, lives with wife and likes to [place] the lawn. Today, pt tolerated bed mobility, transfers and gait training with Min guard-supervision for safety using RW. Cues for upright posture and RW management. Education re: back precautions, brace, log roll technique, walking program etc. Pt has support from wife at home. Will follow acutely to maximize independence and mobility prior to return home.    Follow Up Recommendations No PT follow up;Supervision for mobility/OOB    Equipment Recommendations  None recommended by PT    Recommendations for Other Services       Precautions / Restrictions Precautions Precautions: Back Precaution Booklet Issued: Yes (comment) Precaution Comments: Reviewed back precautions and handout. Required Braces or Orthoses: Spinal Brace Spinal Brace: Lumbar corset;Applied in sitting position Restrictions Weight Bearing Restrictions: No      Mobility  Bed Mobility Overal bed mobility: Needs Assistance Bed Mobility: Rolling;Sidelying to Sit Rolling: Supervision Sidelying to sit: Supervision       General bed mobility comments: HOB flat to simulate home, cues for log roll technique. No assist needed.  Transfers Overall transfer level: Needs assistance Equipment used: Rolling walker (2 wheeled) Transfers: Sit to/from Stand Sit to Stand: Min guard         General transfer comment: Min guard for safety. Cues for hand placement/technique.  Ambulation/Gait Ambulation/Gait assistance: Min guard;Supervision Gait Distance (Feet): 350 Feet Assistive device: Rolling walker (2 wheeled) Gait Pattern/deviations:  Step-through pattern;Trunk flexed;Decreased stride length Gait velocity: decreased   General Gait Details: Slow, mostly steady gait with cues for upright posture. 2/4 DOE.  Stairs            Wheelchair Mobility    Modified Rankin (Stroke Patients Only)       Balance Overall balance assessment: Needs assistance Sitting-balance support: Feet supported;No upper extremity supported Sitting balance-Leahy Scale: Good Sitting balance - Comments: Able to donn brace with setup   Standing balance support: During functional activity Standing balance-Leahy Scale: Poor Standing balance comment: Requires UE support in standing during functional tasks/walking                             Pertinent Vitals/Pain Pain Assessment: No/denies pain    Home Living Family/patient expects to be discharged to:: Private residence Living Arrangements: Spouse/significant other Available Help at Discharge: Family;Available 24 hours/day Type of Home: House Home Access: Stairs to enter Entrance Stairs-Rails: Right Entrance Stairs-Number of Steps: a few tiny steps on the porch Home Layout: One level Home Equipment: Walker - 2 wheels;Cane - single point;Shower seat - built in      Prior Function Level of Independence: Independent         Comments: Drives, likes to mow the lawn.     Hand Dominance        Extremity/Trunk Assessment   Upper Extremity Assessment Upper Extremity Assessment: Defer to OT evaluation    Lower Extremity Assessment Lower Extremity Assessment: Overall WFL for tasks assessed(Sensation WFL BLEs.)    Cervical / Trunk Assessment Cervical / Trunk Assessment: Other exceptions Cervical / Trunk Exceptions: s/p back surgery  Communication   Communication: HOH  Cognition Arousal/Alertness: Awake/alert Behavior  During Therapy: WFL for tasks assessed/performed Overall Cognitive Status: Within Functional Limits for tasks assessed                                         General Comments General comments (skin integrity, edema, etc.): Incision clean dry and intact.    Exercises     Assessment/Plan    PT Assessment Patient needs continued PT services  PT Problem List Decreased mobility;Decreased balance;Cardiopulmonary status limiting activity;Decreased skin integrity;Decreased knowledge of precautions       PT Treatment Interventions DME instruction;Therapeutic activities;Gait training;Stair training;Patient/family education;Balance training;Functional mobility training;Therapeutic exercise    PT Goals (Current goals can be found in the Care Plan section)  Acute Rehab PT Goals Patient Stated Goal: to go home today PT Goal Formulation: With patient Time For Goal Achievement: 07/30/18 Potential to Achieve Goals: Good    Frequency Min 5X/week   Barriers to discharge        Co-evaluation               AM-PAC PT "6 Clicks" Mobility  Outcome Measure Help needed turning from your back to your side while in a flat bed without using bedrails?: None Help needed moving from lying on your back to sitting on the side of a flat bed without using bedrails?: None Help needed moving to and from a bed to a chair (including a wheelchair)?: A Little Help needed standing up from a chair using your arms (e.g., wheelchair or bedside chair)?: A Little Help needed to walk in hospital room?: A Little Help needed climbing 3-5 steps with a railing? : A Little 6 Click Score: 20    End of Session Equipment Utilized During Treatment: Back brace Activity Tolerance: Patient tolerated treatment well Patient left: in bed;with call bell/phone within reach(sitting EOB) Nurse Communication: Mobility status PT Visit Diagnosis: Unsteadiness on feet (R26.81);Difficulty in walking, not elsewhere classified (R26.2)    Time: 0742-0800 PT Time Calculation (min) (ACUTE ONLY): 18 min   Charges:   PT Evaluation $PT Eval Low Complexity: 1  Low          Wray Kearns, PT, DPT Acute Rehabilitation Services Pager [phone] Office [phone]      Marguarite Arbour A Sabra Heck 07/16/2018, 8:22 AM

## 2018-07-19 ENCOUNTER — Encounter (HOSPITAL_COMMUNITY): Payer: Self-pay | Admitting: Orthopedic Surgery

## 2018-07-19 MED FILL — Thrombin (Recombinant) For Soln 20000 Unit: CUTANEOUS | Qty: 1 | Status: AC

## 2018-07-19 NOTE — Discharge Summary (Signed)
Patient ID: Brandon Fischer MRN: [id number] DOB/AGE: 01/28/1933 85 y.o.  Admit date: 07/15/2018 Discharge date: 07/19/2018  Admission Diagnoses:  Active Problems:   Spinal stenosis at L4-L5 level   Discharge Diagnoses:  Active Problems:   Spinal stenosis at L4-L5 level  status post Procedure(s): Posterior lumbar decompression and fusion L4-5  Past Medical History:  Diagnosis Date  . At risk for sleep apnea    STOP-BANG= 4    SENT TO PCP 02-06-2014  . Bilateral carotid artery stenosis without cerebral infarction    ASYMPTOMATIC--  BILATERAL ICA  50-69%  PER CARDIOLOGIST NOTE, DR Einar Gip  . Bilateral carotid bruits    LEFT > RIGHT  . CKD (chronic kidney disease)   . Coronary artery disease cardiologist-- dr Stevphen [place] CAD  per cath 08-06-2004 HX PALPITATIONS-AND -TACHYCARDIA-  . First degree heart block   . Full dentures   . GERD (gastroesophageal reflux disease)   . Heart palpitations    Not any more  . History of atrial fibrillation without current medication    EPISODE OF AFIB WITH RVR INTRAOPERATIVELY 02-09-2014 AT [facility]--  RESOLVED AND PT FOLLOWED UP WITH CARDIOLOGIST  DR Einar Gip  . History of kidney stones   . Hyperlipidemia   . Hypertension   . Nephrolithiasis    RIGHT  . Organic impotence   . Psoriasis    Clearing up  . PSVT (paroxysmal supraventricular tachycardia) ([place])   . Right ureteral calculus   . S/P radiofrequency ablation operation for arrhythmia-SVT 04/28/2014  . Simple renal cyst    right  . Wears glasses   . Wears hearing aid    BILATERAL    Surgeries: Procedure(s): Posterior lumbar decompression and fusion L4-5 on 07/15/2018   Consultants: None  Discharged Condition: Improved  Hospital Course: Brandon Fischer is an 85 y.o. male who was admitted 07/15/2018 for operative treatment of L4/5 spinal stenosis. Patient failed conservative treatments (please see the history and physical for the specifics) and had severe unremitting pain that  affects sleep, daily activities and work/hobbies. After pre-op clearance, the patient was taken to the operating room on 07/15/2018 and underwent  Procedure(s): Posterior lumbar decompression and fusion L4-5.    Patient was given perioperative antibiotics:  Anti-infectives (From admission, onward)   Start     Dose/Rate Route Frequency Ordered Stop   07/15/18 1900  ceFAZolin (ANCEF) IVPB 1 g/50 mL premix     1 g 100 mL/hr over 30 Minutes Intravenous Every 8 hours 07/15/18 1856 07/16/18 0520   07/15/18 0908  ceFAZolin (ANCEF) IVPB 2g/100 mL premix     2 g 200 mL/hr over 30 Minutes Intravenous 30 min pre-op 07/15/18 0908 07/15/18 1637       Patient was given sequential compression devices and early ambulation to prevent DVT.   Patient benefited maximally from hospital stay and there were no complications. At the time of discharge, the patient was urinating/moving their bowels without difficulty, tolerating a regular diet, pain is controlled with oral pain medications and they have been cleared by PT/OT.   Recent vital signs: No data found.   Recent laboratory studies: No results for input(s): WBC, HGB, HCT, PLT, NA, K, CL, CO2, BUN, CREATININE, GLUCOSE, INR, CALCIUM in the last 72 hours.  Invalid input(s): PT, 2   Discharge Medications:   Allergies as of 07/16/2018   No Known Allergies     Medication List    STOP taking these medications   acetaminophen 500 MG tablet Commonly  known as: TYLENOL   cyclobenzaprine 5 MG tablet Commonly known as: FLEXERIL   traMADol 50 MG tablet Commonly known as: ULTRAM     TAKE these medications   amLODipine 10 MG tablet Commonly known as: NORVASC Take 10 mg by mouth daily.   atorvastatin 20 MG tablet Commonly known as: LIPITOR Take 20 mg by mouth every evening.   diphenhydramine-acetaminophen 25-500 MG Tabs tablet Commonly known as: TYLENOL PM Take 2 tablets by mouth at bedtime.   methocarbamol 500 MG tablet Commonly known as: Robaxin  Take 1 tablet (500 mg total) by mouth every 8 (eight) hours as needed for up to 5 days for muscle spasms.   ondansetron 4 MG tablet Commonly known as: Zofran Take 1 tablet (4 mg total) by mouth every 8 (eight) hours as needed for nausea or vomiting.   oxyCODONE-acetaminophen 10-325 MG tablet Commonly known as: Percocet Take 1 tablet by mouth every 6 (six) hours as needed for up to 5 days for pain.   pantoprazole 40 MG tablet Commonly known as: PROTONIX Take 40 mg by mouth daily.       Diagnostic Studies: Dg Chest 2 View  Result Date: 07/09/2018 CLINICAL DATA:  Preoperative evaluation. EXAM: CHEST - 2 VIEW COMPARISON:  PET-CT 09/26/2016. FINDINGS: Mediastinum and hilar structures normal. Lungs are clear. Faint densities noted over lung bases on PA view only again noted consistent nipple shadows. No interim change. Heart size normal. Degenerative change thoracic spine. Surgical clips right upper quadrant. IMPRESSION: No acute cardiopulmonary disease. Electronically Signed   By: Marcello Moores  Register   On: 07/09/2018 08:42   Dg Lumbar Spine 2-3 Views  Result Date: 07/15/2018 CLINICAL DATA:  Lumbar decompression and fusion EXAM: DG C-ARM 61-120 MIN; LUMBAR SPINE - 2-3 VIEW COMPARISON:  None. FLUOROSCOPY TIME:  Radiation Exposure Index (as provided by the fluoroscopic device): Not available If the device does not provide the exposure index: Fluoroscopy Time:  2 minutes 29 seconds Number of Acquired Images:  3 FINDINGS: Pedicle screws are noted at L4-5 with posterior fixation elements. IMPRESSION: L4-5 fusion Electronically Signed   By: Inez Catalina M.D.   On: 07/15/2018 17:17   Dg C-arm 1-60 Min  Result Date: 07/15/2018 CLINICAL DATA:  Lumbar decompression and fusion EXAM: DG C-ARM 61-120 MIN; LUMBAR SPINE - 2-3 VIEW COMPARISON:  None. FLUOROSCOPY TIME:  Radiation Exposure Index (as provided by the fluoroscopic device): Not available If the device does not provide the exposure index: Fluoroscopy  Time:  2 minutes 29 seconds Number of Acquired Images:  3 FINDINGS: Pedicle screws are noted at L4-5 with posterior fixation elements. IMPRESSION: L4-5 fusion Electronically Signed   By: Inez Catalina M.D.   On: 07/15/2018 17:17    Discharge Instructions    Call MD / Call 911   Complete by: As directed    If you experience chest pain or shortness of breath, CALL 911 and be transported to the hospital emergency room.  If you develope a fever above 101 F, pus (white drainage) or increased drainage or redness at the wound, or calf pain, call your surgeon's office.   Constipation Prevention   Complete by: As directed    Drink plenty of fluids.  Prune juice may be helpful.  You may use a stool softener, such as Colace (over the counter) 100 mg twice a day.  Use MiraLax (over the counter) for constipation as needed.   Diet - low sodium heart healthy   Complete by: As directed  Incentive spirometry RT   Complete by: As directed    Increase activity slowly as tolerated   Complete by: As directed       Follow-up Information    Melina Schools, MD In 2 weeks.   Specialty: Orthopedic Surgery Why: If symptoms worsen, For suture removal, For wound re-check Contact information: [address] [place] [address] [phone]           Discharge Plan:  discharge to home  Disposition: stable    Signed: Yvonne Kendall Ward for [facility] PA-C [facility] [phone] 07/19/2018, 11:58 AM

## 2018-07-20 ENCOUNTER — Encounter (HOSPITAL_COMMUNITY): Payer: Self-pay | Admitting: Orthopedic Surgery

## 2018-07-20 NOTE — Anesthesia Postprocedure Evaluation (Signed)
Anesthesia Post Note  Patient: Brandon Fischer  Procedure(s) Performed: Posterior lumbar decompression and fusion L4-5 (N/A )     Patient location during evaluation: PACU Anesthesia Type: General Level of consciousness: awake and alert Pain management: pain level controlled Vital Signs Assessment: post-procedure vital signs reviewed and stable Respiratory status: spontaneous breathing, nonlabored ventilation and respiratory function stable Cardiovascular status: blood pressure returned to baseline and stable Postop Assessment: no apparent nausea or vomiting Anesthetic complications: no    Last Vitals:  Vitals:   07/16/18 0512 07/16/18 0743  BP: (!) 153/79 136/70  Pulse: 99 95  Resp: 18 18  Temp: 37.1 C 36.9 C  SpO2: 100% 98%    Last Pain:  Vitals:   07/16/18 0800  TempSrc:   PainSc: 3    Pain Goal: Patients Stated Pain Goal: 3 (07/16/18 0629)                 Lynda Rainwater

## 2018-07-20 NOTE — OR Nursing (Signed)
Late entry, updated chart information added.

## 2018-07-28 ENCOUNTER — Encounter (HOSPITAL_COMMUNITY): Payer: Self-pay | Admitting: Orthopedic Surgery

## 2018-08-12 DIAGNOSIS — H6123 Impacted cerumen, bilateral: Secondary | ICD-10-CM | POA: Diagnosis not present

## 2018-08-23 DIAGNOSIS — Z4889 Encounter for other specified surgical aftercare: Secondary | ICD-10-CM | POA: Diagnosis not present

## 2018-08-25 DIAGNOSIS — Z4889 Encounter for other specified surgical aftercare: Secondary | ICD-10-CM | POA: Diagnosis not present

## 2018-08-27 DIAGNOSIS — Z4889 Encounter for other specified surgical aftercare: Secondary | ICD-10-CM | POA: Diagnosis not present

## 2018-08-31 DIAGNOSIS — Z4889 Encounter for other specified surgical aftercare: Secondary | ICD-10-CM | POA: Diagnosis not present

## 2018-09-02 DIAGNOSIS — Z4889 Encounter for other specified surgical aftercare: Secondary | ICD-10-CM | POA: Diagnosis not present

## 2018-09-07 DIAGNOSIS — Z4889 Encounter for other specified surgical aftercare: Secondary | ICD-10-CM | POA: Diagnosis not present

## 2018-09-09 DIAGNOSIS — Z4889 Encounter for other specified surgical aftercare: Secondary | ICD-10-CM | POA: Diagnosis not present

## 2018-09-14 DIAGNOSIS — Z4889 Encounter for other specified surgical aftercare: Secondary | ICD-10-CM | POA: Diagnosis not present

## 2018-09-16 DIAGNOSIS — Z4889 Encounter for other specified surgical aftercare: Secondary | ICD-10-CM | POA: Diagnosis not present

## 2018-09-21 DIAGNOSIS — Z4889 Encounter for other specified surgical aftercare: Secondary | ICD-10-CM | POA: Diagnosis not present

## 2018-09-23 DIAGNOSIS — Z4889 Encounter for other specified surgical aftercare: Secondary | ICD-10-CM | POA: Diagnosis not present

## 2018-10-05 DIAGNOSIS — Z981 Arthrodesis status: Secondary | ICD-10-CM | POA: Diagnosis not present

## 2018-10-13 DIAGNOSIS — R3912 Poor urinary stream: Secondary | ICD-10-CM | POA: Diagnosis not present

## 2018-10-13 DIAGNOSIS — N401 Enlarged prostate with lower urinary tract symptoms: Secondary | ICD-10-CM | POA: Diagnosis not present

## 2018-10-13 DIAGNOSIS — N200 Calculus of kidney: Secondary | ICD-10-CM | POA: Diagnosis not present

## 2018-10-22 DIAGNOSIS — Z23 Encounter for immunization: Secondary | ICD-10-CM | POA: Diagnosis not present

## 2018-10-28 DIAGNOSIS — M25552 Pain in left hip: Secondary | ICD-10-CM | POA: Diagnosis not present

## 2018-10-28 NOTE — Progress Notes (Signed)
Brandon Fischer    HEMATOLOGY/ONCOLOGY CLINIC NOTE  Date of Service: 10/29/2018  Patient Care Team: Deland Pretty, MD as PCP - General (Internal Medicine)  CHIEF COMPLAINTS/PURPOSE OF CONSULTATION:  Anemia and leucopenia  HISTORY OF PRESENTING ILLNESS:   Brandon Fischer is a wonderful 85 y.o. male  who has been referred to [place] by Dr .Deland Pretty, MD for evaluation and management of anemia and leukopenia.  Patient has a history of hypertension, dyslipidemia, atrial fibrillation, coronary artery disease, bilateral carotid artery stenosis, paroxysmal supraventricular tachycardias status post radiofrequency ablation, GERD who on recent labs with his primary care physician on 09/01/2016 which showed new anemia with a hemoglobin of 9.3 with an MCV of 82.9 and RDW of 18.5. WBC count of 2.1k with an ANC of about 700 and mild thrombocytopenia of 119k.  CMP showed bilirubin of 1.7 with an alkaline phosphatase of 154 normal AST ALT creatinine 1.9 with BUN of 24.  Patient subsequently had an ultrasound of the abdomen done on 09/09/2016 for evaluation of his abnormal liver function tests and renal insufficiency and was noted to have severe splenomegaly with this plane off 18 cm in size with a volume of 1407 mL. No focal hepatic abnormalities. No bladder distention.. 3.7 cm abdominal aortic aneurysm.  Repeat labs done in clinic today show a slight improvement in WBC count of 3.6k with an ANC of 1000. Hemoglobin 9.4 and platelets of 108k . Patient has a obvious deep palpable spleen on clinical examination. Overtly palpable lymphadenopathy noted.  Patient notes no overt weight loss fevers chills or night sweats.  No change in mental status. No chest pain no shortness of breath no significant abdominal discomfort headaches. No evidence of bleeding. No issues with recent infections. Some fatigue.  INTERVAL HISTORY  Brandon Fischer returns today for management and evaluation of his pancytopenia. The patient's  last visit with [place] was on 04/29/2018. The pt reports that he is doing well overall.  The pt reports he had back surgery in July 2020. He was having pain in his right hip and surgery has eliminated the pain.  Lab results today (10/29/18) of CBC w/diff and CMP is as follows: all values are WNL except for Hemoglobin at 11.0, HCT at 33.7, MCV at 79.5, MCH at 25.9, RDW at 16.2, Glucose Bld at 125, Creatinine at 1.44, Calcium at 8.8, Alkaline Phosphatase at 140, Total Bilirubin at 1.5, GFR, Est Non Af Am at 45, GFR, and Est AFR Am at 52. PENDING ERYTHROPOIETIN.  On review of systems, pt reports some fatigue  and denies leg swelling, infections, abdominal pain and any other symptoms.    MEDICAL HISTORY:  Past Medical History:  Diagnosis Date  . At risk for sleep apnea    STOP-BANG= 4    SENT TO PCP 02-06-2014  . Bilateral carotid artery stenosis without cerebral infarction    ASYMPTOMATIC--  BILATERAL ICA  50-69%  PER CARDIOLOGIST NOTE, DR Einar Gip  . Bilateral carotid bruits    LEFT > RIGHT  . CKD (chronic kidney disease)   . Coronary artery disease cardiologist-- dr Stevphen [place] CAD  per cath 08-06-2004 HX PALPITATIONS-AND -TACHYCARDIA-  . First degree heart block   . Full dentures   . GERD (gastroesophageal reflux disease)   . Heart palpitations    Not any more  . History of atrial fibrillation without current medication    EPISODE OF AFIB WITH RVR INTRAOPERATIVELY 02-09-2014 AT [facility]--  RESOLVED AND PT FOLLOWED UP WITH CARDIOLOGIST  DR  Einar Gip  . History of kidney stones   . Hyperlipidemia   . Hypertension   . Nephrolithiasis    RIGHT  . Organic impotence   . Psoriasis    Clearing up  . PSVT (paroxysmal supraventricular tachycardia) ([place])   . Right ureteral calculus   . S/P radiofrequency ablation operation for arrhythmia-SVT 04/28/2014  . Simple renal cyst    right  . Wears glasses   . Wears hearing aid    BILATERAL  History of malignant melanoma status post Mohs  surgery to remove the skin lesion on the face with skin grafting. 3 years ago.  SURGICAL HISTORY: Past Surgical History:  Procedure Laterality Date  . CARDIAC CATHETERIZATION  08-06-2004 dr Verlon Setting   (abnormal stress test) Non-obstructive CAD, pLAD 20-30%,  LCX 30-40%, pRCA 20-30%, dRCA 40%, distal LM  mild diffuse calcifation 20-30% tapering stenosis,  preserved LVSF ef 55-60%  . CARPAL TUNNEL RELEASE Bilateral 2005  . CATARACT EXTRACTION, BILATERAL Bilateral 2011  . CHOLECYSTECTOMY OPEN  1982   and NEPHROLITHOTOMY  . COLONOSCOPY W/ POLYPECTOMY  last one 04-05-2008  . CYSTOSCOPY WITH LITHOLAPAXY Right 04/14/2017   Procedure: CYSTOSCOPY WITH LITHOLAPAXY/RIGHT RETROGRADE PLYOGRAM/RIGHT URETEROSCOPY;  Surgeon: [name], MD;  Location: [facility];  Service: Urology;  Laterality: Right;  . CYSTOSCOPY WITH RETROGRADE PYELOGRAM, URETEROSCOPY AND STENT PLACEMENT Right 03/14/2014   Procedure: CYSTO/RIGHT RETROGRADE PYELOGRAM/RIGHT URETEROSCOPY/STONE EXTRACTION/STENT PLACEMENT;  Surgeon: Malka So, MD;  Location: [facility];  Service: Urology;  Laterality: Right;  . EYE SURGERY Bilateral    Cataracts removed  . HOLMIUM LASER APPLICATION Right [id number]   Procedure: RIGHT HOLMIUM LASER APPLICATION;  Surgeon: Malka So, MD;  Location: [place];  Service: Urology;  Laterality: Right;  . KNEE ARTHROSCOPY W/ DEBRIDEMENT Right 11-23-2002   and Synovectomy  . LUMBAR LAMINECTOMY/DECOMPRESSION MICRODISCECTOMY N/A 07/15/2018   Procedure: Posterior lumbar decompression and fusion L4-5;  Surgeon: Melina Schools, MD;  Location: [place];  Service: Orthopedics;  Laterality: N/A;  4 hrs  . NEPHROLITHOTOMY  01/16/2011   Procedure: NEPHROLITHOTOMY PERCUTANEOUS;  Surgeon: Malka So;  Location: [facility];  Service: Urology;  Laterality: Left;  C-Arm  Holmium Laser  . NEPHROLITHOTOMY  1981  . SUPRAVENTRICULAR TACHYCARDIA ABLATION N/A 04/28/2014   Procedure: SUPRAVENTRICULAR TACHYCARDIA  ABLATION;  Surgeon: Evans Lance, MD;  Location: [facility] C[facility];  Service: Cardiovascular;  Laterality: N/A;    SOCIAL HISTORY: Social History   Socioeconomic History  . Marital status: Married    Spouse name: Not on file  . Number of children: 2  . Years of education: Not on file  . Highest education level: Not on file  Occupational History  . Occupation: Actor     Comment: retired  Scientific laboratory technician  . Financial resource strain: Not on file  . Food insecurity    Worry: Not on file    Inability: Not on file  . Transportation needs    Medical: Not on file    Non-medical: Not on file  Tobacco Use  . Smoking status: Former Smoker    Packs/day: 1.00    Years: 40.00    Pack years: 40.00    Types: Cigarettes    Quit date: 02/06/1998    Years since quitting: 20.7  . Smokeless tobacco: Never Used  Substance and Sexual Activity  . Alcohol use: No  . Drug use: No  . Sexual activity: Not on file  Lifestyle  . Physical activity    Days per  week: Not on file    Minutes per session: Not on file  . Stress: Not on file  Relationships  . Social Herbalist on phone: Not on file    Gets together: Not on file    Attends religious service: Not on file    Active member of club or organization: Not on file    Attends meetings of clubs or organizations: Not on file    Relationship status: Not on file  . Intimate partner violence    Fear of current or ex partner: Not on file    Emotionally abused: Not on file    Physically abused: Not on file    Forced sexual activity: Not on file  Other Topics Concern  . Not on file  Social History Narrative  . Not on file    FAMILY HISTORY: Family History  Problem Relation Age of Onset  . Cancer Mother        Ovarian or colon  . Heart attack Father   . Heart attack Brother     ALLERGIES:  has No Known Allergies.  MEDICATIONS:  Current Outpatient Medications  Medication Sig Dispense Refill  . amLODipine (NORVASC) 10 MG  tablet Take 10 mg by mouth daily.     Brandon Fischer atorvastatin (LIPITOR) 20 MG tablet Take 20 mg by mouth every evening.     . diphenhydramine-acetaminophen (TYLENOL PM) 25-500 MG TABS Take 2 tablets by mouth at bedtime.    . ondansetron (ZOFRAN) 4 MG tablet Take 1 tablet (4 mg total) by mouth every 8 (eight) hours as needed for nausea or vomiting. 20 tablet 0  . pantoprazole (PROTONIX) 40 MG tablet Take 40 mg by mouth daily.      No current facility-administered medications for this visit.     REVIEW OF SYSTEMS:    A 10+ POINT REVIEW OF SYSTEMS WAS OBTAINED including neurology, dermatology, psychiatry, cardiac, respiratory, lymph, extremities, GI, GU, Musculoskeletal, constitutional, breasts, reproductive, HEENT.  All pertinent positives are noted in the HPI.  All others are negative.  Brandon Fischer   PHYSICAL EXAMINATION:  ECOG FS:1 - Symptomatic but completely ambulatory  Vitals:   10/29/18 1235  BP: (!) 156/74  Pulse: 77  Resp: 18  Temp: 98.7 F (37.1 C)  SpO2: 100%   Wt Readings from Last 3 Encounters:  07/15/18 199 lb 9.6 oz (90.5 kg)  07/08/18 199 lb 9.6 oz (90.5 kg)  04/29/18 202 lb 9.6 oz (91.9 kg)   Body mass index is 28.26 kg/m.    GENERAL:alert, in no acute distress and comfortable SKIN: no acute rashes, no significant lesions EYES: conjunctiva are pink and non-injected, sclera anicteric OROPHARYNX: MMM, no exudates, no oropharyngeal erythema or ulceration NECK: supple, no JVD LYMPH:  no palpable lymphadenopathy in the cervical, axillary or inguinal regions LUNGS: clear to auscultation b/l with normal respiratory effort HEART: regular rate & rhythm ABDOMEN:  normoactive bowel sounds , non tender, not distended. Extremity: no pedal edema PSYCH: alert & oriented x 3 with fluent speech NEURO: no focal motor/sensory deficits   LABORATORY DATA:  I have reviewed the data as listed  . CBC Latest Ref Rng & Units 10/29/2018 07/08/2018 04/29/2018  WBC 4.0 - 10.5 K/uL 5.5 7.5 5.2   Hemoglobin 13.0 - 17.0 g/dL 11.0(L) 12.7(L) 10.6(L)  Hematocrit 39.0 - 52.0 % 33.7(L) 40.1 32.2(L)  Platelets 150 - 400 K/uL 181 188 148(L)   ANC 1800  CBC    Component Value Date/Time   WBC 5.5  10/29/2018 1045   RBC 4.24 10/29/2018 1045   HGB 11.0 (L) 10/29/2018 1045   HGB 12.0 (L) 12/24/2016 1113   HCT 33.7 (L) 10/29/2018 1045   HCT 36.2 (L) 12/24/2016 1113   PLT 181 10/29/2018 1045   PLT 123 (L) 12/24/2016 1113   MCV 79.5 (L) 10/29/2018 1045   MCV 79.7 12/24/2016 1113   MCH 25.9 (L) 10/29/2018 1045   MCHC 32.6 10/29/2018 1045   RDW 16.2 (H) 10/29/2018 1045   RDW 16.2 (H) 12/24/2016 1113   LYMPHSABS 1.3 10/29/2018 1045   LYMPHSABS 0.7 (L) 12/24/2016 1113   MONOABS 0.4 10/29/2018 1045   MONOABS 0.4 12/24/2016 1113   EOSABS 0.1 10/29/2018 1045   EOSABS 0.0 12/24/2016 1113   BASOSABS 0.0 10/29/2018 1045   BASOSABS 0.0 12/24/2016 1113     . CMP Latest Ref Rng & Units 10/29/2018 07/08/2018 04/29/2018  Glucose 70 - 99 mg/dL 125(H) 110(H) 127(H)  BUN 8 - 23 mg/dL 14 16 33(H)  Creatinine 0.61 - 1.24 mg/dL 1.44(H) 1.49(H) 2.07(H)  Sodium 135 - 145 mmol/L 144 139 140  Potassium 3.5 - 5.1 mmol/L 4.1 3.6 3.8  Chloride 98 - 111 mmol/L 106 105 110  CO2 22 - 32 mmol/L 27 22 19(L)  Calcium 8.9 - 10.3 mg/dL 8.8(L) 9.1 8.6(L)  Total Protein 6.5 - 8.1 g/dL 6.5 - 6.6  Total Bilirubin 0.3 - 1.2 mg/dL 1.5(H) - 1.2  Alkaline Phos 38 - 126 U/L 140(H) - 126  AST 15 - 41 U/L 16 - 15  ALT 0 - 44 U/L 13 - 13   . Lab Results  Component Value Date   LDH 171 06/24/2017     Component     Latest Ref Rng & Units 09/09/2016  IgG (Immunoglobin G), Serum     700 - 1,600 mg/dL 917  IgA/Immunoglobulin A, Serum     61 - 437 mg/dL 203  IgM, Qn, Serum     15 - 143 mg/dL 43  Total Protein     6.0 - 8.5 g/dL 6.1  Albumin SerPl Elph-Mcnc     2.9 - 4.4 g/dL 3.7  Alpha 1     0.0 - 0.4 g/dL 0.3  Alpha2 Glob SerPl Elph-Mcnc     0.4 - 1.0 g/dL 0.5  B-Globulin SerPl Elph-Mcnc     0.7 - 1.3  g/dL 0.8  Gamma Glob SerPl Elph-Mcnc     0.4 - 1.8 g/dL 0.7  M Protein SerPl Elph-Mcnc     Not Observed g/dL Not Observed  Globulin, Total     2.2 - 3.9 g/dL 2.4  Albumin/Glob SerPl     0.7 - 1.7 1.6  IFE 1      Comment  Please Note (HCV):      Comment  Iron     42 - 163 ug/dL 64  TIBC     202 - 409 ug/dL 194 (L)  UIBC     117 - 376 ug/dL 130  %SAT     20 - 55 % 33  Folate, Hemolysate     Not Estab. ng/mL 369.8  HCT     37.5 - 51.0 % 28.0 (L)  Folate, RBC     >498 ng/mL 1,321  Ig Kappa Free Light Chain     3.3 - 19.4 mg/L 21.4 (H)  Ig Lambda Free Light Chain     5.7 - 26.3 mg/L 36.6 (H)  Kappa/Lambda FluidC Ratio     0.26 - 1.65 0.58  LDH  125 - 245 U/L 326 (H)  Sed Rate     0 - 30 mm/hr 2  Vitamin B12     232 - 1245 pg/mL 1,261 (H)  TSH     0.320 - 4.118 m(IU)/L 2.609  Hep C Virus Ab     0.0 - 0.9 s/co ratio 0.1  Ferritin     22 - 316 ng/ml 399 (H)   Component     Latest Ref Rng & Units 09/17/2016 09/18/2016  Prothrombin Time     11.4 - 15.2 seconds  14.2  INR       1.11  LDH     125 - 245 U/L 309 (H)   Haptoglobin     34 - 200 mg/dL <10 (L)   Coombs', Direct     Negative Negative   APTT     24 - 36 seconds  32   Component     Latest Ref Rng & Units 09/30/2016  RA Latex Turbid.     0.0 - 13.9 IU/mL <10.0  CCP Antibodies IgG/IgA     0 - 19 units 12  ANA Ab, IFA      Negative  Sed Rate     0 - 30 mm/hr 2   Component     Latest Ref Rng & Units 09/30/2016 09/30/2016 09/30/2016 10/22/2016         3:23 PM  3:23 PM  3:23 PM   Interpretation       Comment    Comment:       Comment Comment   Specimen:       Peripheral Blood    Submitted Dx:       Comment    Viability:       97%    Cell Population       Comment    Granulocytes:       Comment    Monocytes:       Comment    Antibodies Performed:       Comment    Director Review       Comment    RA Latex Turbid.     0.0 - 13.9 IU/mL <10.0     CCP Antibodies IgG/IgA     0 - 19 units 12      ANA Ab, IFA      Negative     LDH     125 - 245 U/L    185           RADIOGRAPHIC STUDIES: I have personally reviewed the radiological images as listed and agreed with the findings in the report. No results found.  ASSESSMENT & PLAN:   #1 Pancytopenia with normocytic Anemia, neutropenia and thrombocytopenia This could certainly be from the patient's splenomegaly causing hypersplenism. Etiology of the splenomegaly is unclear.  09/18/16 Bone marrow showed no overt evidence of MDS/Myelofibrosis no evidence of acute leukemia Increased CD8+ T cell population ? Reactive vs clonal. Normal cytogenetics. Sed rate WNL neg ANA and Rheumatoid panel  No evidence of monoclonal paraproteinemia. TSH levels within normal limits B12, folate and iron levels within normal limits. Hepatitis C negative  Given improvement in counts - the T cell population likely reactive and could be related to a viral infection that appears to be resolving now.  05/07/17 US Abdomen revealed decreased spleen size to about 900 cubic cm, and explains mild thrombocytopenia with PLT at 128k   10/02/17 US Abdomen revealed Status post cholecystectomy. Bilateral simple renal cysts  are noted. Mild splenomegaly is noted with calculated volume of 630 cubic cm, which is decreased compared to prior exam.  #2 Elevated LDH With mild elevated bilirubin cannot r/o some hemolysis though that would not explain leucopenia/neutropenia or thrombocytopenia and splenomegaly by itself LDH slightly lower. Haptoglobin <10 --- suggestive of some hemolysis. Coombs neg.  LDH levels have normalized  PLAN: A&P: -Discussed pt labwork today, 10/29/18; CBC w/diff and CMP is as follows: all values are WNL except for Hemoglobin at 11.0, HCT at 33.7, MCV at 79.5, MCH at 25.9, RDW at 16.2, Glucose Bld at 125, Creatinine at 1.44, Calcium at 8.8, Alkaline Phosphatase at 140, Total Bilirubin at 1.5, GFR, Est Non Af Am at 45, GFR, and Est AFR Am at  52. PENDING ERYTHROPOIETIN  -Discussed that blood counts are normal and suggested to continue following up with PCP with blood counts q6 hours -Advised to RTC if there is any more concern from PCP -Recommended to continue taking B supplement.  FOLLOW UP: RTC with Dr Irene Limbo as needed    No orders of the defined types were placed in this encounter.   The total time spent in the appt was 20 minutes and more than 50% was on counseling and direct patient cares.  All of the patient's questions were answered with apparent satisfaction. The patient knows to call the clinic with any problems, questions or concerns.    Sullivan Lone MD MS AAHIVMS [facility] Hematology/Oncology Physician [facility]  (Office):       [phone] (Work cell):  [phone] (Fax):           [phone]  I, Scot Dock, am acting as a scribe for Dr. Sullivan Lone.   .I have reviewed the above documentation for accuracy and completeness, and I agree with the above. Brunetta Genera MD

## 2018-10-29 ENCOUNTER — Other Ambulatory Visit: Payer: Self-pay

## 2018-10-29 ENCOUNTER — Inpatient Hospital Stay: Payer: Medicare Other | Attending: Hematology

## 2018-10-29 ENCOUNTER — Inpatient Hospital Stay (HOSPITAL_BASED_OUTPATIENT_CLINIC_OR_DEPARTMENT_OTHER): Payer: Medicare Other | Admitting: Hematology

## 2018-10-29 VITALS — BP 156/74 | HR 77 | Temp 98.7°F | Resp 18 | Ht 70.5 in | Wt 199.8 lb

## 2018-10-29 DIAGNOSIS — Z87891 Personal history of nicotine dependence: Secondary | ICD-10-CM | POA: Diagnosis not present

## 2018-10-29 DIAGNOSIS — D696 Thrombocytopenia, unspecified: Secondary | ICD-10-CM | POA: Insufficient documentation

## 2018-10-29 DIAGNOSIS — D709 Neutropenia, unspecified: Secondary | ICD-10-CM | POA: Diagnosis not present

## 2018-10-29 DIAGNOSIS — D649 Anemia, unspecified: Secondary | ICD-10-CM | POA: Insufficient documentation

## 2018-10-29 DIAGNOSIS — R7402 Elevation of levels of lactic acid dehydrogenase (LDH): Secondary | ICD-10-CM | POA: Diagnosis not present

## 2018-10-29 DIAGNOSIS — D61818 Other pancytopenia: Secondary | ICD-10-CM | POA: Insufficient documentation

## 2018-10-29 DIAGNOSIS — R161 Splenomegaly, not elsewhere classified: Secondary | ICD-10-CM

## 2018-10-29 LAB — CMP (CANCER CENTER ONLY)
ALT: 13 U/L (ref 0–44)
AST: 16 U/L (ref 15–41)
Albumin: 4 g/dL (ref 3.5–5.0)
Alkaline Phosphatase: 140 U/L — ABNORMAL HIGH (ref 38–126)
Anion gap: 11 (ref 5–15)
BUN: 14 mg/dL (ref 8–23)
CO2: 27 mmol/L (ref 22–32)
Calcium: 8.8 mg/dL — ABNORMAL LOW (ref 8.9–10.3)
Chloride: 106 mmol/L (ref 98–111)
Creatinine: 1.44 mg/dL — ABNORMAL HIGH (ref 0.61–1.24)
GFR, Est AFR Am: 52 mL/min — ABNORMAL LOW (ref >60)
GFR, Estimated: 45 mL/min — ABNORMAL LOW (ref >60)
Glucose, Bld: 125 mg/dL — ABNORMAL HIGH (ref 70–99)
Potassium: 4.1 mmol/L (ref 3.5–5.1)
Sodium: 144 mmol/L (ref 135–145)
Total Bilirubin: 1.5 mg/dL — ABNORMAL HIGH (ref 0.3–1.2)
Total Protein: 6.5 g/dL (ref 6.5–8.1)

## 2018-10-29 LAB — CBC WITH DIFFERENTIAL/PLATELET
Abs Immature Granulocytes: 0.02 10*3/uL (ref 0.00–0.07)
Basophils Absolute: 0 10*3/uL (ref 0.0–0.1)
Basophils Relative: 0 %
Eosinophils Absolute: 0.1 10*3/uL (ref 0.0–0.5)
Eosinophils Relative: 1 %
HCT: 33.7 % — ABNORMAL LOW (ref 39.0–52.0)
Hemoglobin: 11 g/dL — ABNORMAL LOW (ref 13.0–17.0)
Immature Granulocytes: 0 %
Lymphocytes Relative: 24 %
Lymphs Abs: 1.3 10*3/uL (ref 0.7–4.0)
MCH: 25.9 pg — ABNORMAL LOW (ref 26.0–34.0)
MCHC: 32.6 g/dL (ref 30.0–36.0)
MCV: 79.5 fL — ABNORMAL LOW (ref 80.0–100.0)
Monocytes Absolute: 0.4 10*3/uL (ref 0.1–1.0)
Monocytes Relative: 8 %
Neutro Abs: 3.7 10*3/uL (ref 1.7–7.7)
Neutrophils Relative %: 67 %
Platelets: 181 10*3/uL (ref 150–400)
RBC: 4.24 MIL/uL (ref 4.22–5.81)
RDW: 16.2 % — ABNORMAL HIGH (ref 11.5–15.5)
WBC: 5.5 10*3/uL (ref 4.0–10.5)
nRBC: 0 % (ref 0.0–0.2)

## 2018-10-29 LAB — FERRITIN: Ferritin: 175 ng/mL (ref 24–336)

## 2018-10-30 LAB — ERYTHROPOIETIN: Erythropoietin: 36.6 m[IU]/mL — ABNORMAL HIGH (ref 2.6–18.5)

## 2018-11-01 ENCOUNTER — Telehealth: Payer: Self-pay | Admitting: Hematology

## 2018-11-01 NOTE — Telephone Encounter (Signed)
Per 10/16 los RTC with Dr Irene Limbo as needed

## 2018-11-03 DIAGNOSIS — M76899 Other specified enthesopathies of unspecified lower limb, excluding foot: Secondary | ICD-10-CM | POA: Diagnosis not present

## 2018-11-03 DIAGNOSIS — M25552 Pain in left hip: Secondary | ICD-10-CM | POA: Diagnosis not present

## 2018-11-11 DIAGNOSIS — M76892 Other specified enthesopathies of left lower limb, excluding foot: Secondary | ICD-10-CM | POA: Diagnosis not present

## 2018-11-11 DIAGNOSIS — M25552 Pain in left hip: Secondary | ICD-10-CM | POA: Diagnosis not present

## 2018-12-11 IMAGING — CT CT ABD-PELV W/ CM
2 of 5 series · 16 of 46 positions shown, 18 images · IV contrast (ISOVUE)
Comparison: CT abdomen pelvis 01/24/2009

CLINICAL DATA: Right-sided flank pain.

EXAM:
CT ABDOMEN AND PELVIS WITH CONTRAST
TECHNIQUE: Multidetector CT imaging of the abdomen and pelvis was performed
using the standard protocol following bolus administration of
intravenous contrast.
CONTRAST:  100mL AWCHEL-622 IOPAMIDOL (AWCHEL-622) INJECTION 61%

[Series 2: axial st · axial · 0.85mm/px · z∈[+1231,+1631]mm · 13 of 94 slices shown, 15 images]
[im 7/94  soft-tissue]
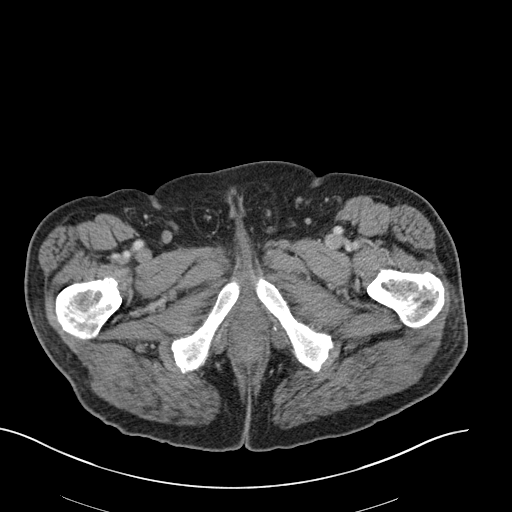
[im 7/94  bone]
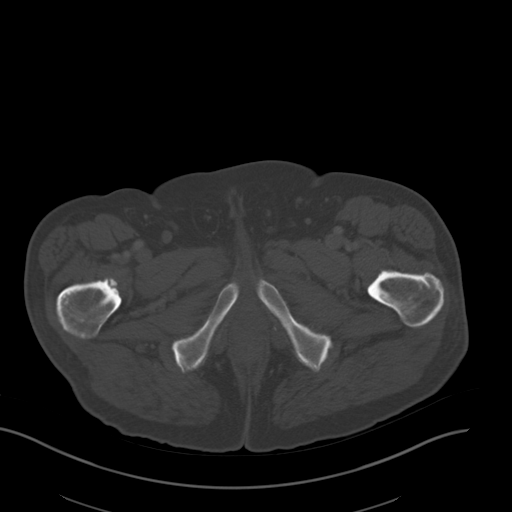
[im 14/94  soft-tissue]
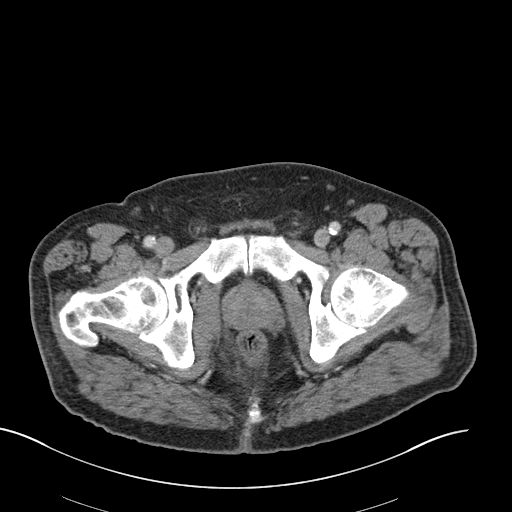
[im 20/94  soft-tissue]
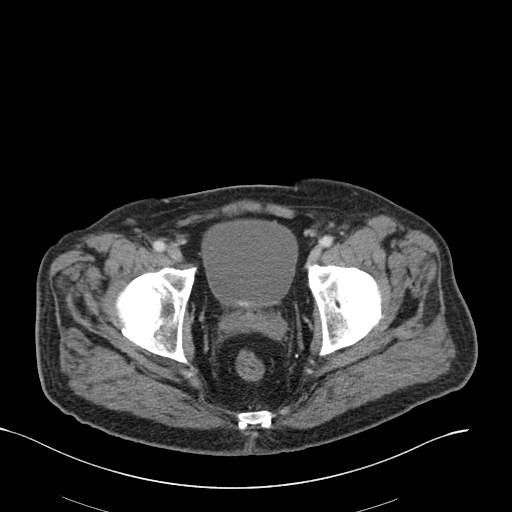
[im 27/94  soft-tissue]
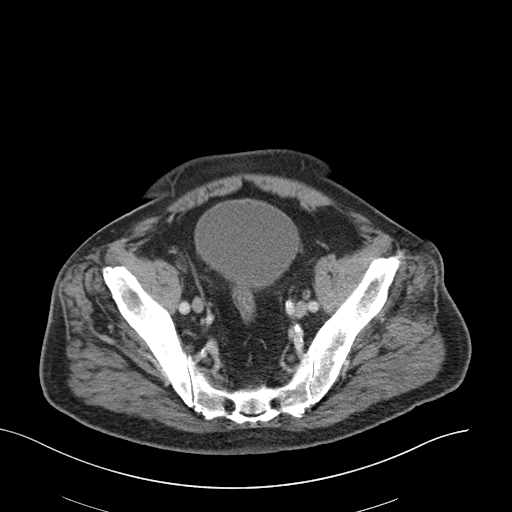
[im 34/94  soft-tissue]
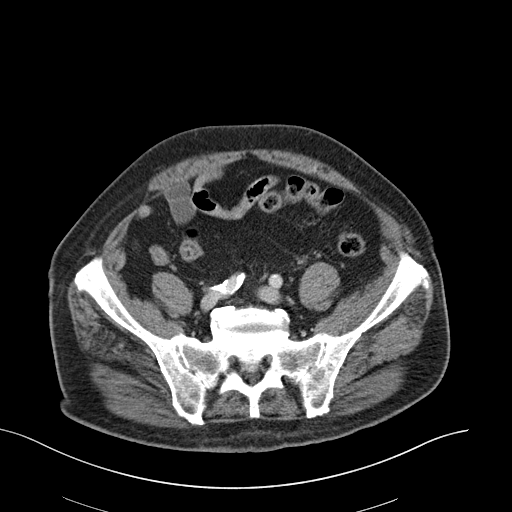
[im 40/94  soft-tissue]
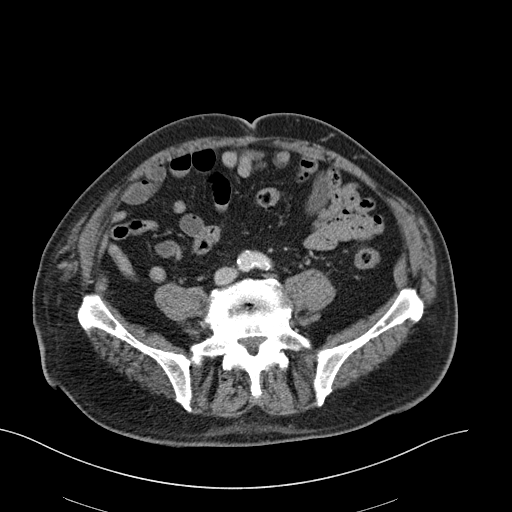
[im 47/94  soft-tissue]
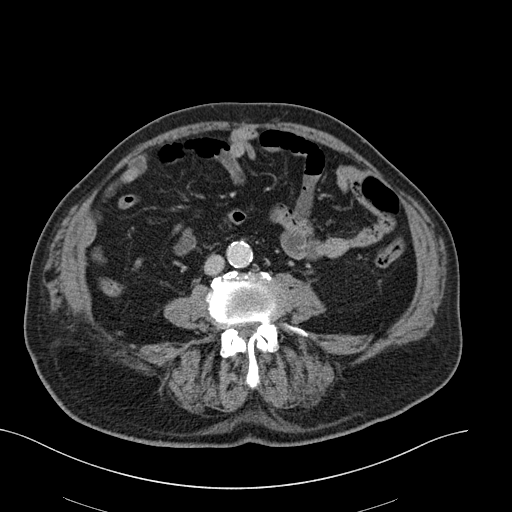
[im 54/94  soft-tissue]
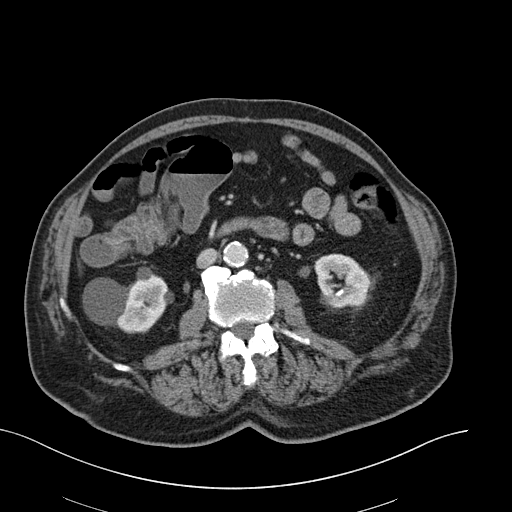
[im 60/94  soft-tissue]
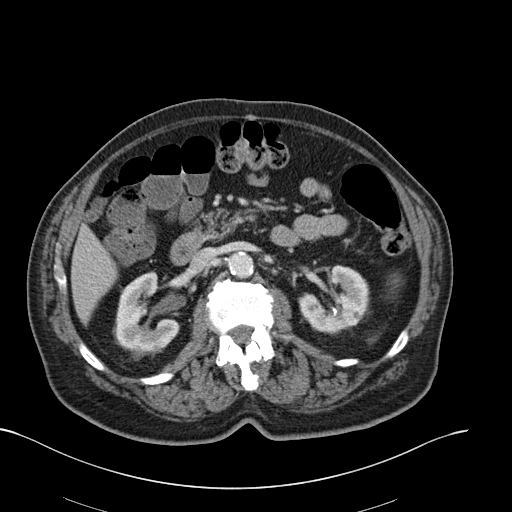
[im 60/94  bone]
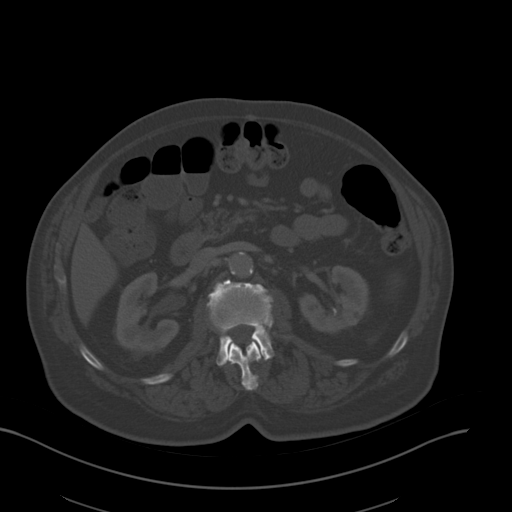
[im 67/94  soft-tissue]
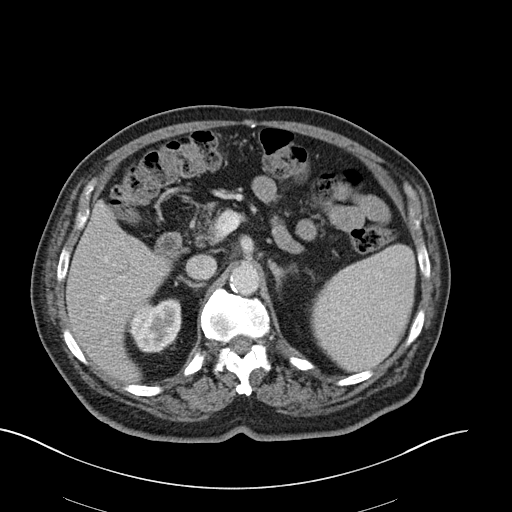
[im 74/94  soft-tissue]
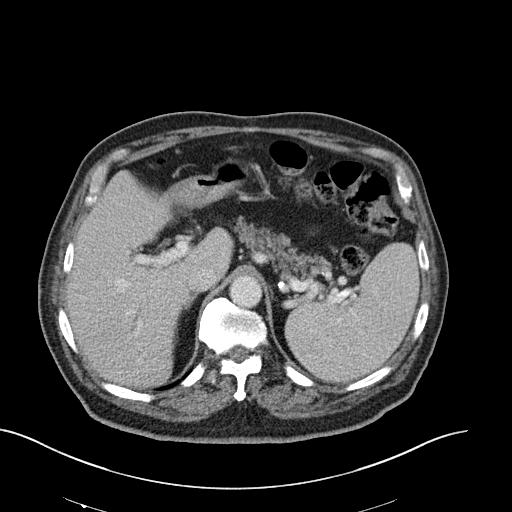
[im 80/94  soft-tissue]
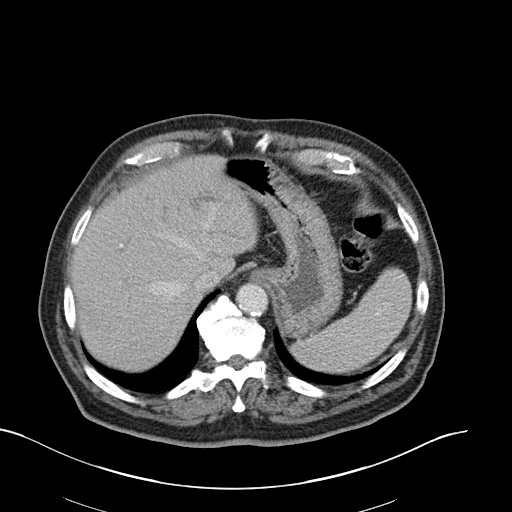
[im 87/94  soft-tissue]
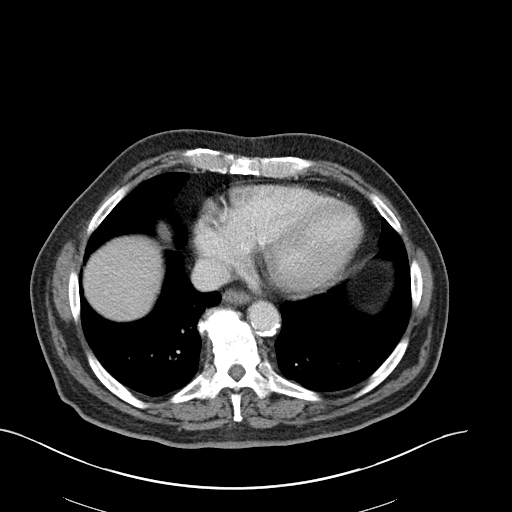

[Series 5: coronal st · coronal · 0.81mm/px · 3 of 101 slices shown]
[im 34/101  soft-tissue]
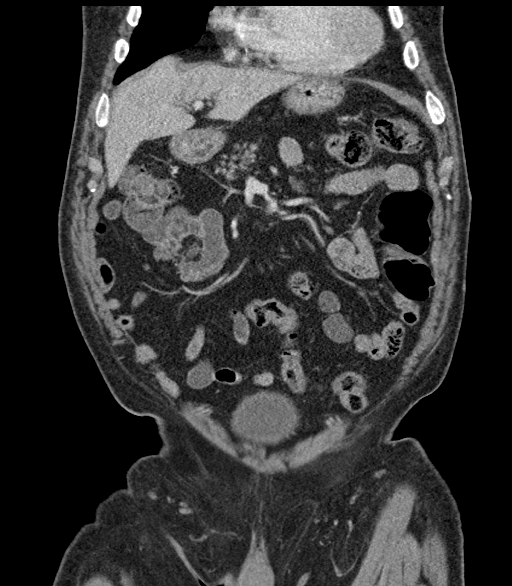
[im 45/101  soft-tissue]
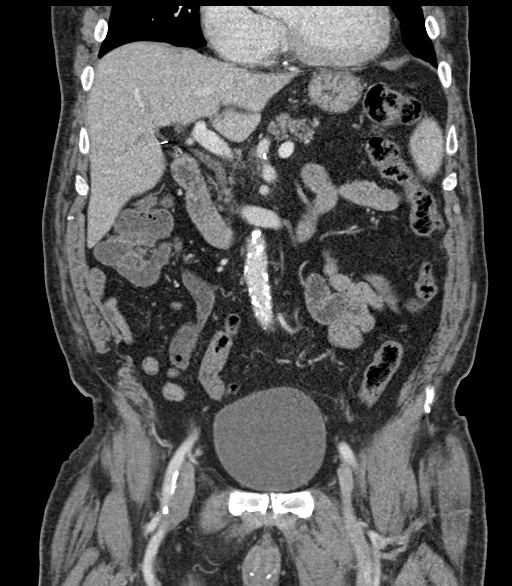
[im 56/101  soft-tissue]
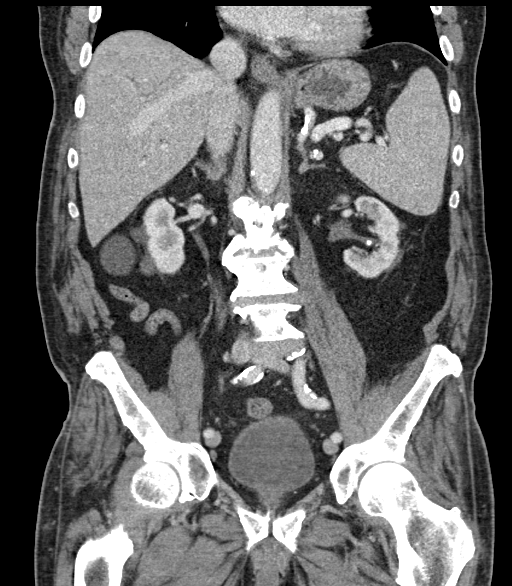

[16 of 46 positions shown; findings below may reference images not displayed]

FINDINGS: Lower chest: No basilar pulmonary nodules or pleural effusion. No
apical pericardial effusion.

Hepatobiliary: Normal hepatic contours and density. No visible
biliary dilatation. Normal gallbladder.

Pancreas: Normal parenchymal contours without ductal dilatation. No
peripancreatic fluid collection.

Spleen: Normal.

Adrenals/Urinary Tract:

--Adrenal glands: Normal.

--Right kidney/ureter: No hydronephrosis. Decreased number of renal
stones. Nonobstructive interpolar calculus measures 3 mm. Multiple
renal cysts, the largest of which measures 3.7 cm.

--Left kidney/ureter: Decreased burden of renal calculi. There are
nonobstructive stones the upper pole, lower pole and in the
interpolar region. The largest measures 8 mm. No hydronephrosis. The
ureter is unobstructed.

--Urinary bladder: There are multiple calculi lying dependently
within the urinary bladder, measuring up to 12 mm.

Stomach/Bowel:

--Stomach/Duodenum: No hiatal hernia or other gastric abnormality.
Normal duodenal course.

--Small bowel: No dilatation or inflammation.

--Colon: No focal abnormality.

--Appendix: Normal.

Vascular/Lymphatic: Atherosclerotic calcification is present within
the non-aneurysmal abdominal aorta, without hemodynamically
significant stenosis. The portal vein, splenic vein, superior
mesenteric vein and IVC are patent. No abdominal or pelvic
lymphadenopathy.

Reproductive: Heterogeneous prostate is upper limits of normal in
size.

Musculoskeletal. Multilevel degenerative disc disease and facet
arthrosis. No bony spinal canal stenosis.

Other: None.
IMPRESSION: 1. Decreased burden of nephrolithiasis.  No obstructive uropathy.
2. Stones resting dependently within the urinary bladder without
bladder outlet obstruction.
3. No acute abnormality of the abdomen or pelvis.
4.  Aortic Atherosclerosis ([id number]).

## 2018-12-17 ENCOUNTER — Emergency Department (HOSPITAL_BASED_OUTPATIENT_CLINIC_OR_DEPARTMENT_OTHER)
Admission: EM | Admit: 2018-12-17 | Discharge: 2018-12-17 | Disposition: A | Discharge status: Home / Self Care | Payer: Medicare Other | Attending: Emergency Medicine | Admitting: Emergency Medicine

## 2018-12-17 ENCOUNTER — Other Ambulatory Visit: Payer: Self-pay

## 2018-12-17 ENCOUNTER — Encounter (HOSPITAL_BASED_OUTPATIENT_CLINIC_OR_DEPARTMENT_OTHER): Payer: Self-pay

## 2018-12-17 ENCOUNTER — Emergency Department (HOSPITAL_BASED_OUTPATIENT_CLINIC_OR_DEPARTMENT_OTHER): Payer: Medicare Other

## 2018-12-17 DIAGNOSIS — Z79899 Other long term (current) drug therapy: Secondary | ICD-10-CM | POA: Insufficient documentation

## 2018-12-17 DIAGNOSIS — R1031 Right lower quadrant pain: Secondary | ICD-10-CM | POA: Insufficient documentation

## 2018-12-17 DIAGNOSIS — N189 Chronic kidney disease, unspecified: Secondary | ICD-10-CM | POA: Diagnosis not present

## 2018-12-17 DIAGNOSIS — N201 Calculus of ureter: Secondary | ICD-10-CM | POA: Diagnosis not present

## 2018-12-17 DIAGNOSIS — I2510 Atherosclerotic heart disease of native coronary artery without angina pectoris: Secondary | ICD-10-CM | POA: Diagnosis not present

## 2018-12-17 DIAGNOSIS — I129 Hypertensive chronic kidney disease with stage 1 through stage 4 chronic kidney disease, or unspecified chronic kidney disease: Secondary | ICD-10-CM | POA: Insufficient documentation

## 2018-12-17 DIAGNOSIS — Z87891 Personal history of nicotine dependence: Secondary | ICD-10-CM | POA: Insufficient documentation

## 2018-12-17 DIAGNOSIS — R197 Diarrhea, unspecified: Secondary | ICD-10-CM | POA: Diagnosis not present

## 2018-12-17 DIAGNOSIS — R109 Unspecified abdominal pain: Secondary | ICD-10-CM | POA: Diagnosis present

## 2018-12-17 LAB — URINALYSIS, ROUTINE W REFLEX MICROSCOPIC
Bilirubin Urine: NEGATIVE
Glucose, UA: NEGATIVE mg/dL
Ketones, ur: NEGATIVE mg/dL
Leukocytes,Ua: NEGATIVE
Nitrite: NEGATIVE
Protein, ur: 30 mg/dL — AB
Specific Gravity, Urine: >1.03 — ABNORMAL HIGH (ref 1.005–1.030)
pH: 6 (ref 5.0–8.0)

## 2018-12-17 LAB — COMPREHENSIVE METABOLIC PANEL
ALT: 18 U/L (ref 0–44)
AST: 16 U/L (ref 15–41)
Albumin: 4.2 g/dL (ref 3.5–5.0)
Alkaline Phosphatase: 132 U/L — ABNORMAL HIGH (ref 38–126)
Anion gap: 7 (ref 5–15)
BUN: 14 mg/dL (ref 8–23)
CO2: 26 mmol/L (ref 22–32)
Calcium: 8.8 mg/dL — ABNORMAL LOW (ref 8.9–10.3)
Chloride: 109 mmol/L (ref 98–111)
Creatinine, Ser: 1.39 mg/dL — ABNORMAL HIGH (ref 0.61–1.24)
GFR calc Af Amer: 54 mL/min — ABNORMAL LOW (ref >60)
GFR calc non Af Amer: 47 mL/min — ABNORMAL LOW (ref >60)
Glucose, Bld: 114 mg/dL — ABNORMAL HIGH (ref 70–99)
Potassium: 3.9 mmol/L (ref 3.5–5.1)
Sodium: 142 mmol/L (ref 135–145)
Total Bilirubin: 1.7 mg/dL — ABNORMAL HIGH (ref 0.3–1.2)
Total Protein: 6.7 g/dL (ref 6.5–8.1)

## 2018-12-17 LAB — URINALYSIS, MICROSCOPIC (REFLEX)

## 2018-12-17 LAB — CBC WITH DIFFERENTIAL/PLATELET
Abs Immature Granulocytes: 0.02 10*3/uL (ref 0.00–0.07)
Basophils Absolute: 0 10*3/uL (ref 0.0–0.1)
Basophils Relative: 0 %
Eosinophils Absolute: 0 10*3/uL (ref 0.0–0.5)
Eosinophils Relative: 1 %
HCT: 37.3 % — ABNORMAL LOW (ref 39.0–52.0)
Hemoglobin: 11.7 g/dL — ABNORMAL LOW (ref 13.0–17.0)
Immature Granulocytes: 0 %
Lymphocytes Relative: 20 %
Lymphs Abs: 1.3 10*3/uL (ref 0.7–4.0)
MCH: 25.4 pg — ABNORMAL LOW (ref 26.0–34.0)
MCHC: 31.4 g/dL (ref 30.0–36.0)
MCV: 80.9 fL (ref 80.0–100.0)
Monocytes Absolute: 0.5 10*3/uL (ref 0.1–1.0)
Monocytes Relative: 7 %
Neutro Abs: 4.6 10*3/uL (ref 1.7–7.7)
Neutrophils Relative %: 72 %
Platelets: 183 10*3/uL (ref 150–400)
RBC: 4.61 MIL/uL (ref 4.22–5.81)
RDW: 16.9 % — ABNORMAL HIGH (ref 11.5–15.5)
WBC: 6.4 10*3/uL (ref 4.0–10.5)
nRBC: 0 % (ref 0.0–0.2)

## 2018-12-17 MED ORDER — IOHEXOL 300 MG/ML  SOLN
100.0000 mL | Freq: Once | INTRAMUSCULAR | Status: AC | PRN
  Administered 2018-12-17: 80 mL via INTRAVENOUS

## 2018-12-17 MED ORDER — TAMSULOSIN HCL 0.4 MG PO CAPS: 0.4000 mg | ORAL_CAPSULE | Freq: Every day | ORAL | 0 refills | Status: DC

## 2018-12-17 MED ORDER — SODIUM CHLORIDE 0.9 % IV BOLUS
500.0000 mL | Freq: Once | INTRAVENOUS | Status: AC
  Administered 2018-12-17: 500 mL via INTRAVENOUS

## 2018-12-17 NOTE — ED Triage Notes (Signed)
Pt c/o RLQ pain x 1 week-states he was sent by PCP to r/o appendicitis-NAD-steady gait

## 2018-12-17 NOTE — ED Provider Notes (Signed)
[place] EMERGENCY DEPARTMENT Provider Note   CSN: [id number] Arrival date & time: 12/17/18  1245     History   Chief Complaint Chief Complaint  Patient presents with  . Abdominal Pain    HPI Brandon Fischer is a 85 y.o. male.     Patient with history of peripheral vascular disease, chronic kidney disease, previous cholecystectomy --sent by Dr. Shelia Media today to rule out appendicitis.  Patient reports right-sided abdominal pain ongoing for about a week.  Symptoms were more severe yesterday.  He denies any fevers, nausea, vomiting, constipation, diarrhea.  Pain is worse when he bends over.  No urinary symptoms, dark urine, or blood in the urine.  He is not taking any medications.  No anticoagulation.  Onset of symptoms acute.  Nothing makes symptoms better.     Past Medical History:  Diagnosis Date  . At risk for sleep apnea    STOP-BANG= 4    SENT TO PCP 02-06-2014  . Bilateral carotid artery stenosis without cerebral infarction    ASYMPTOMATIC--  BILATERAL ICA  50-69%  PER CARDIOLOGIST NOTE, DR Einar Gip  . Bilateral carotid bruits    LEFT > RIGHT  . CKD (chronic kidney disease)   . Coronary artery disease cardiologist-- dr Stevphen [place] CAD  per cath 08-06-2004 HX PALPITATIONS-AND -TACHYCARDIA-  . First degree heart block   . Full dentures   . GERD (gastroesophageal reflux disease)   . Heart palpitations    Not any more  . History of atrial fibrillation without current medication    EPISODE OF AFIB WITH RVR INTRAOPERATIVELY 02-09-2014 AT [facility]--  RESOLVED AND PT FOLLOWED UP WITH CARDIOLOGIST  DR Einar Gip  . History of kidney stones   . Hyperlipidemia   . Hypertension   . Nephrolithiasis    RIGHT  . Organic impotence   . Psoriasis    Clearing up  . PSVT (paroxysmal supraventricular tachycardia) ([place])   . Right ureteral calculus   . S/P radiofrequency ablation operation for arrhythmia-SVT 04/28/2014  . Simple renal cyst    right  . Wears glasses    . Wears hearing aid    BILATERAL    Patient Active Problem List   Diagnosis Date Noted  . Spinal stenosis at L4-L5 level 07/15/2018  . S/P radiofrequency ablation operation for arrhythmia-SVT 04/28/2014  . SVT (supraventricular tachycardia) ([place]) 03/15/2014  . Nephrolithiasis 01/17/2011  . Hypertension   . Dyslipidemia   . Palpitation     Past Surgical History:  Procedure Laterality Date  . CARDIAC CATHETERIZATION  08-06-2004 dr Verlon Setting   (abnormal stress test) Non-obstructive CAD, pLAD 20-30%,  LCX 30-40%, pRCA 20-30%, dRCA 40%, distal LM  mild diffuse calcifation 20-30% tapering stenosis,  preserved LVSF ef 55-60%  . CARPAL TUNNEL RELEASE Bilateral 2005  . CATARACT EXTRACTION, BILATERAL Bilateral 2011  . CHOLECYSTECTOMY OPEN  1982   and NEPHROLITHOTOMY  . COLONOSCOPY W/ POLYPECTOMY  last one 04-05-2008  . CYSTOSCOPY WITH LITHOLAPAXY Right 04/14/2017   Procedure: CYSTOSCOPY WITH LITHOLAPAXY/RIGHT RETROGRADE PLYOGRAM/RIGHT URETEROSCOPY;  Surgeon: [name], MD;  Location: [facility];  Service: Urology;  Laterality: Right;  . CYSTOSCOPY WITH RETROGRADE PYELOGRAM, URETEROSCOPY AND STENT PLACEMENT Right 03/14/2014   Procedure: CYSTO/RIGHT RETROGRADE PYELOGRAM/RIGHT URETEROSCOPY/STONE EXTRACTION/STENT PLACEMENT;  Surgeon: Malka So, MD;  Location[facility];  Service: Urology;  Laterality: Right;  . EYE SURGERY Bilateral    Cataracts removed  . HOLMIUM LASER APPLICATION Right [id number]   Procedure: RIGHT HOLMIUM LASER APPLICATION;  Surgeon: Malka So, MD;  Location: [facility];  Service: Urology;  Laterality: Right;  . KNEE ARTHROSCOPY W/ DEBRIDEMENT Right 11-23-2002   and Synovectomy  . LUMBAR LAMINECTOMY/DECOMPRESSION MICRODISCECTOMY N/A 07/15/2018   Procedure: Posterior lumbar decompression and fusion L4-5;  Surgeon: Melina Schools, MD;  Location: [facility];  Service: Orthopedics;  Laterality: N/A;  4 hrs  . NEPHROLITHOTOMY  01/16/2011   Procedure:  NEPHROLITHOTOMY PERCUTANEOUS;  Surgeon: Malka So;  Location: [facility];  Service: Urology;  Laterality: Left;  C-Arm  Holmium Laser  . NEPHROLITHOTOMY  1981  . SUPRAVENTRICULAR TACHYCARDIA ABLATION N/A 04/28/2014   Procedure: SUPRAVENTRICULAR TACHYCARDIA ABLATION;  Surgeon: Evans Lance, MD;  Location: [facility] C[facility];  Service: Cardiovascular;  Laterality: N/A;        Home Medications    Prior to Admission medications   Medication Sig Start Date End Date Taking? Authorizing Provider  amLODipine (NORVASC) 10 MG tablet Take 10 mg by mouth daily.  05/10/18   [provider]  atorvastatin (LIPITOR) 20 MG tablet Take 20 mg by mouth every evening.     [provider]  diphenhydramine-acetaminophen (TYLENOL PM) 25-500 MG TABS Take 2 tablets by mouth at bedtime.    [provider]  ondansetron (ZOFRAN) 4 MG tablet Take 1 tablet (4 mg total) by mouth every 8 (eight) hours as needed for nausea or vomiting. 07/15/18   Melina Schools, MD  pantoprazole (PROTONIX) 40 MG tablet Take 40 mg by mouth daily.     [provider]    Family History Family History  Problem Relation Age of Onset  . Cancer Mother        Ovarian or colon  . Heart attack Father   . Heart attack Brother     Social History Social History   Tobacco Use  . Smoking status: Former Smoker    Packs/day: 1.00    Years: 40.00    Pack years: 40.00    Types: Cigarettes    Quit date: 02/06/1998    Years since quitting: 20.8  . Smokeless tobacco: Never Used  Substance Use Topics  . Alcohol use: No  . Drug use: No     Allergies   Patient has no known allergies.   Review of Systems Review of Systems  Constitutional: Negative for fever.  HENT: Negative for rhinorrhea and sore throat.   Eyes: Negative for redness.  Respiratory: Negative for cough.   Cardiovascular: Negative for chest pain.  Gastrointestinal: Positive for abdominal pain. Negative for blood in stool, constipation, diarrhea,  nausea and vomiting.  Genitourinary: Positive for flank pain. Negative for dysuria and hematuria.  Musculoskeletal: Negative for myalgias.  Skin: Negative for rash.  Neurological: Negative for headaches.     Physical Exam Updated Vital Signs BP (!) 166/64 (BP Location: Left Arm)   Pulse 70   Temp 98.7 F (37.1 C) (Oral)   Resp 14   Ht 5\' 11"  (1.803 m)   Wt 90.3 kg   SpO2 100%   BMI 27.75 kg/m   Physical Exam Vitals signs and nursing note reviewed.  Constitutional:      Appearance: He is well-developed.  HENT:     Head: Normocephalic and atraumatic.  Eyes:     General:        Right eye: No discharge.        Left eye: No discharge.     Conjunctiva/sclera: Conjunctivae normal.  Neck:     Musculoskeletal: Normal range of motion and  neck supple.  Cardiovascular:     Rate and Rhythm: Normal rate and regular rhythm.     Heart sounds: Normal heart sounds.  Pulmonary:     Effort: Pulmonary effort is normal.     Breath sounds: Normal breath sounds.  Abdominal:     Palpations: Abdomen is soft.     Tenderness: There is abdominal tenderness in the right lower quadrant. There is no right CVA tenderness, left CVA tenderness, guarding or rebound. Positive signs include psoas sign.     Comments: Patient moderately uncomfortable with palpation over the right lower quadrant and right lateral abdominal wall.  Skin:    General: Skin is warm and dry.  Neurological:     Mental Status: He is alert.      ED Treatments / Results  Labs (all labs ordered are listed, but only abnormal results are displayed) Labs Reviewed  CBC WITH DIFFERENTIAL/PLATELET - Abnormal; Notable for the following components:      Result Value   Hemoglobin 11.7 (*)    HCT 37.3 (*)    MCH 25.4 (*)    RDW 16.9 (*)    All other components within normal limits  COMPREHENSIVE METABOLIC PANEL - Abnormal; Notable for the following components:   Glucose, Bld 114 (*)    Creatinine, Ser 1.39 (*)    Calcium 8.8 (*)     Alkaline Phosphatase 132 (*)    Total Bilirubin 1.7 (*)    GFR calc non Af Amer 47 (*)    GFR calc Af Amer 54 (*)    All other components within normal limits  URINALYSIS, ROUTINE W REFLEX MICROSCOPIC - Abnormal; Notable for the following components:   APPearance HAZY (*)    Specific Gravity, Urine >1.030 (*)    Hgb urine dipstick MODERATE (*)    Protein, ur 30 (*)    All other components within normal limits  URINALYSIS, MICROSCOPIC (REFLEX) - Abnormal; Notable for the following components:   Bacteria, UA RARE (*)    All other components within normal limits    EKG None  Radiology Ct Abdomen Pelvis W Contrast  Result Date: 12/17/2018 CLINICAL DATA:  Right lower quadrant abdominal pain for 1 week with diarrhea. History kidney stones. EXAM: CT ABDOMEN AND PELVIS WITH CONTRAST TECHNIQUE: Multidetector CT imaging of the abdomen and pelvis was performed using the standard protocol following bolus administration of intravenous contrast. CONTRAST:  6mL OMNIPAQUE IOHEXOL 300 MG/ML  SOLN COMPARISON:  Abdominopelvic CT 03/31/2017. FINDINGS: Lower chest: Clear lung bases. No significant pleural or pericardial effusion. There is atherosclerosis of the aorta and coronary arteries. There are probable aortic valvular calcifications. Hepatobiliary: The liver is normal in density without suspicious focal abnormality. No significant biliary dilatation post cholecystectomy. Pancreas: Unremarkable. No pancreatic ductal dilatation or surrounding inflammatory changes. Spleen: The spleen is stable in size without focal abnormality. Adrenals/Urinary Tract: Both adrenal glands appear normal. There are small nonobstructing bilateral renal calculi, largest in the lower pole of the left kidney, measuring 8 mm in diameter. There is a new minimally obstructing calculus at the left ureteral pelvic junction, measuring up to 9 mm on coronal image 50/5. No associated hydronephrosis or significant delay in contrast  excretion. Bilateral renal cysts are grossly stable. There is a small residual bladder calculus, measuring 5 mm on image 82/2. Stomach/Bowel: No evidence of bowel wall thickening, distention or surrounding inflammatory change. The appendix appears normal. Vascular/Lymphatic: There are no enlarged abdominal or pelvic lymph nodes. Aortic and branch vessel atherosclerosis. Reproductive:  Mild prosthetic enlargement. Other: Postsurgical changes in the anterior abdominal wall. No hernia or ascites. Musculoskeletal: No acute or significant osseous findings. Stable appearance of the lumbar spine status post L4-5 fusion. IMPRESSION: 1. New minimally obstructing 9 mm calculus at the left ureteropelvic junction. 2. Nonobstructing bilateral renal calculi. 3. Small residual bladder calculus. 4. Aortic Atherosclerosis (ICD10-I70.0). Electronically Signed   By: Richardean Sale M.D.   On: 12/17/2018 14:56    Procedures Procedures (including critical care time)  Medications Ordered in ED Medications  sodium chloride 0.9 % bolus 500 mL (0 mLs Intravenous Stopped 12/17/18 1501)  iohexol (OMNIPAQUE) 300 MG/ML solution 100 mL (80 mLs Intravenous Contrast Given 12/17/18 1430)     Initial Impression / Assessment and Plan / ED Course  I have reviewed the triage vital signs and the nursing notes.  Pertinent labs & imaging results that were available during my care of the patient were reviewed by me and considered in my medical decision making (see chart for details).  Clinical Course as of Dec 17 1531  Fri Dec 04, 461  [id number] 85 year old male complaining of right lower quadrant abdominal pain going on for a week but acutely worse since yesterday.  No fever no urinary symptoms.  No trauma.  He is soft although does have some focal tenderness in the right lower quadrant.  Getting labs urinalysis and a CAT scan abdomen and pelvis.  Disposition per results of testing.   [MB]    Clinical Course User Index [MB] Hayden Rasmussen, MD       Patient seen and examined. Work-up initiated.  Patient appears comfortable while lying still.  Imaging ordered to evaluate for appendicitis or other infectious etiology.    Vital signs reviewed and are as follows: BP (!) 166/64 (BP Location: Left Arm)   Pulse 70   Temp 98.7 F (37.1 C) (Oral)   Resp 14   Ht 5\' 11"  (1.803 m)   Wt 90.3 kg   SpO2 100%   BMI 27.75 kg/m   CT imaging reviewed.  Patient with a 5 mm stone in bladder.  Also with a proximal 9 mm stone on the left.  Patient is asymptomatic on the left side.  No signs of appendicitis, obstruction, perforation, or other emergent etiology.  I suspect that the stone in the bladder was previously on the right side which was causing his symptoms.  Kidney function at baseline.  Patient has been given a bolus of normal saline.  Will place patient on 7-day course of Flomax.  Patient follows with Dr. Jeffie Pollock of urology.  I encouraged him to call the office for an appointment as his left sided ureteral stone will need to be followed.  Discussed that this is rather large.  Patient counseled on kidney stone treatment. Urged patient to strain urine and save any stones. Urged urology follow-up and return to [facility] with any complications. Counseled patient to maintain good fluid intake.   Counseled patient on use of Flomax.   Final Clinical Impressions(s) / ED Diagnoses   Final diagnoses:  Right lower quadrant abdominal pain  Left ureteral stone   Patient with right-sided abdominal pain, history of kidney stones.  Patient does not have any right-sided stones however there is a kidney stone in the bladder which I suspect he recently passed.  There is a large stone on the left side however patient is asymptomatic.  No other concerning findings.  Chronic kidney cyst as noted.  No appendicitis.  Aortic atherosclerosis  noted without aneurysm or dissection.  Plan is for symptom control and outpatient follow-up at this time.  Return  instructions as above.  ED Discharge Orders         Ordered    tamsulosin (FLOMAX) 0.4 MG CAPS capsule  Daily     12/17/18 1531           Carlisle Cater, PA-C 12/17/18 1536    Hayden Rasmussen, MD 12/17/18 [phone]

## 2018-12-17 NOTE — ED Notes (Signed)
ED Provider at bedside. 

## 2018-12-17 NOTE — Discharge Instructions (Signed)
Please read and follow all provided instructions.  Your diagnoses today include:  1. Right lower quadrant abdominal pain   2. Left ureteral stone     Tests performed today include:  Urine test that showed blood in your urine and no infection  CT scan which showed a 57mm millimeter kidney stone on the left side and a 27mm stone in the bladder  Blood test that showed unchanged kidney function  Vital signs. See below for your results today.   Medications prescribed:   Flomax (tamsulosin) - relaxes smooth muscle to help kidney stones pass  Take any prescribed medications only as directed.  Home care instructions:  Follow any educational materials contained in this packet.  Please double your fluid intake for the next several days. Strain your urine and save any stones that may pass.   BE VERY CAREFUL not to take multiple medicines containing Tylenol (also called acetaminophen). Doing so can lead to an overdose which can damage your liver and cause liver failure and possibly death.   Follow-up instructions: Please follow-up with your urologist or the urologist referral (provided on front page) in the next 1 week for further evaluation of your symptoms.  Return instructions:  If you need to return to the Emergency Department, go to [facility] and not [facility]. The urologists are located at [facility] and can better care for you at this location.   Please return to the Emergency Department if you experience worsening symptoms.  Please return if you develop fever or uncontrolled pain or vomiting.  Please return if you have any other emergent concerns.  Additional Information:  Your vital signs today were: BP (!) 166/64 (BP Location: Left Arm)    Pulse 70    Temp 98.7 F (37.1 C) (Oral)    Resp 14    Ht 5\' 11"  (1.803 m)    Wt 90.3 kg    SpO2 100%    BMI 27.75 kg/m  If your blood pressure (BP) was elevated above 135/85 this visit, please have this repeated by  your doctor within one month. --------------

## 2018-12-17 NOTE — ED Notes (Signed)
Pt. Passed 30mm stone before leaving

## 2018-12-17 NOTE — ED Notes (Signed)
Patient transported to CT 

## 2018-12-28 DIAGNOSIS — Z981 Arthrodesis status: Secondary | ICD-10-CM | POA: Diagnosis not present

## 2019-02-08 DIAGNOSIS — N183 Chronic kidney disease, stage 3 unspecified: Secondary | ICD-10-CM | POA: Diagnosis not present

## 2019-02-16 DIAGNOSIS — I129 Hypertensive chronic kidney disease with stage 1 through stage 4 chronic kidney disease, or unspecified chronic kidney disease: Secondary | ICD-10-CM | POA: Diagnosis not present

## 2019-02-16 DIAGNOSIS — I2510 Atherosclerotic heart disease of native coronary artery without angina pectoris: Secondary | ICD-10-CM | POA: Diagnosis not present

## 2019-02-16 DIAGNOSIS — D631 Anemia in chronic kidney disease: Secondary | ICD-10-CM | POA: Diagnosis not present

## 2019-02-16 DIAGNOSIS — N1831 Chronic kidney disease, stage 3a: Secondary | ICD-10-CM | POA: Diagnosis not present

## 2019-02-16 DIAGNOSIS — R809 Proteinuria, unspecified: Secondary | ICD-10-CM | POA: Diagnosis not present

## 2019-02-16 DIAGNOSIS — N200 Calculus of kidney: Secondary | ICD-10-CM | POA: Diagnosis not present

## 2019-02-22 DIAGNOSIS — N202 Calculus of kidney with calculus of ureter: Secondary | ICD-10-CM | POA: Diagnosis not present

## 2019-03-10 DIAGNOSIS — N200 Calculus of kidney: Secondary | ICD-10-CM | POA: Diagnosis not present

## 2019-03-15 DIAGNOSIS — L570 Actinic keratosis: Secondary | ICD-10-CM | POA: Diagnosis not present

## 2019-03-15 DIAGNOSIS — L905 Scar conditions and fibrosis of skin: Secondary | ICD-10-CM | POA: Diagnosis not present

## 2019-03-15 DIAGNOSIS — D225 Melanocytic nevi of trunk: Secondary | ICD-10-CM | POA: Diagnosis not present

## 2019-03-15 DIAGNOSIS — L821 Other seborrheic keratosis: Secondary | ICD-10-CM | POA: Diagnosis not present

## 2019-03-15 DIAGNOSIS — L814 Other melanin hyperpigmentation: Secondary | ICD-10-CM | POA: Diagnosis not present

## 2019-03-15 DIAGNOSIS — Z85820 Personal history of malignant melanoma of skin: Secondary | ICD-10-CM | POA: Diagnosis not present

## 2019-04-06 DIAGNOSIS — N202 Calculus of kidney with calculus of ureter: Secondary | ICD-10-CM | POA: Diagnosis not present

## 2019-04-07 ENCOUNTER — Other Ambulatory Visit: Payer: Self-pay | Admitting: Urology

## 2019-04-12 ENCOUNTER — Other Ambulatory Visit: Payer: Self-pay

## 2019-04-12 ENCOUNTER — Encounter (HOSPITAL_BASED_OUTPATIENT_CLINIC_OR_DEPARTMENT_OTHER): Payer: Self-pay | Admitting: Urology

## 2019-04-12 DIAGNOSIS — Z981 Arthrodesis status: Secondary | ICD-10-CM | POA: Diagnosis not present

## 2019-04-12 NOTE — Progress Notes (Addendum)
Spoke with Janett Billow zanetto pa ok to proceed  Spoke w/ via phone for pre-op interview---patient Lab needs dos----   I stat 8           COVID test ------[id number] Arrive at -------530 am 04-19-2019 NPO after -----midnight- Medications to take morning of surgery -----amlodipine Diabetic medication -----n/a Patient Special Instructions -----none Pre-Op special Istructions -----none Patient verbalized understanding of instructions that were given at this phone interview. Patient denies shortness of breath, chest pain, fever, cough a this phone interview.  Anesthesia : ekg 07-08-2018 2nd degree heart block type 1 Chart to jessica zanetto pa for review  PCP:de walter pharr Cardiologist :last saw dr Einar Gip 07-24-2014 note says follow up in 6 months if clinically indicated Chest x-ray :none EKG :07-08-2018 Echo :none Cardiac Cath : 08-06-2004 Nephrology dr Cecille Rubin foster 02-16-2019 on chart  Sleep Study/ CPAP :n/a Fasting Blood Sugar :      / Checks Blood Sugar -- times a day:  n/a Blood Thinner/ Instructions /Last Dose: ASA / Instructions/ Last Dose : n/a  Patient denies shortness of breath, chest pain, fever, and cough at this phone interview.

## 2019-04-13 ENCOUNTER — Encounter (HOSPITAL_BASED_OUTPATIENT_CLINIC_OR_DEPARTMENT_OTHER): Payer: Self-pay | Admitting: Urology

## 2019-04-14 NOTE — H&P (Signed)
I have a history of kidney stones.  HPI: Brandon Fischer is a 86 year-old male established patient who is here for F/U due to a history of renal calculi.  The patient has had flank pain since they were last seen.   04/06/19: Brandon Fischer returns today in f/u. He has passed a right sided stone but has had no left flank pain and has not passed anything that would be consistent with the large left proximal stone seen on CT on 12/17/18. A renal US today shows no hydronephrosis but I felt a CT was indicated and there is still as large 7x5x52mm stone in the left proximal ureter but no obstruction. There are bilateral renal stones.   02/22/2019: Here today for f/u, referred from nephrology. Patient was evaluated in the emergency department in December for right-sided flank pain and discomfort. CT imaging performed at that time noted a minimally obstructing left UPJ as well as a small 5 mm stone noted within the dependent portion of the bladder. Bilateral nonobstructing calculi also noted with largest being in the left lower pole. Patient actually passed a stone during his ER visit with significant improvement overall in voiding symptoms. Interpretation of CT imaging performed at that time is annotated in today's chart.  He has hx of CKD labeled as stage 3a but renal function has been stable since at least December with creatinine holding steady at 1.39. He denies hematuria, dysuria, fevers, chills, nausea, vomiting, flank pain and interval passage of stone fragments. He has stable nocturia but otherwise no additional bothersome voiding complaints.   10/13/18: Brandon Fischer returns today in f/u for his history of stones. He has had no flank pain or hematuria. He had back pain and right sciatica that was treated with an L4-5 laminectomy in July and he is doing better with resolution of the pain. KUB today shows a 24mm LLP stone but no other stones. He is voiding well with an IPSS of 9. His primary complaint is a weak stream. He has  no other associated signs or symptoms.   April 2019: He underwent cystoscopy litholapaxy for removal of a 1.2 cm bladder stone as well as 2 smaller calculi earlier this month. He also had evaluation of the right ureter which was found to be free of obvious obstruction. He had some burning and increase irritating symptoms a few days after the procedure. The on-call urologist telephoned and cephalexin which the patient has since completed. His symptoms have now resolved. He states his stream is adequate with no increased frequency or urgency. Denies burning or painful urination. Denies gross hematuria. No longer having any right-sided flank or lower back pain. He remains afebrile.   Jun 10, 2017: Back today for renal ultrasound. He's not had any interval passage of stone material. Denies unilateral pain or discomfort. States voiding at his baseline with non-bothersome daytime frequency and nocturia 1-2 times per night. He states his stream is adequate and he feels empty with each void. Denies any dysuria or gross hematuria. No interval treatment for UTI.     ALLERGIES: No Allergies    MEDICATIONS: Amlodipine Besylate 10 mg tablet  Atorvastatin Calcium 20 mg tablet  Pantoprazole Sodium 40 mg tablet, delayed release  Tramadol Hcl 50 mg tablet Oral  Tylenol Pm Extra Strength 500 mg-25 mg tablet  Vitamin B Complex     GU PSH: Cysto Remove Stent FB Sim - 04/14/2017 Cysto Uretero Lithotripsy - 2016 Cystoscopy Insert Stent - 2016 Cystoscopy Ureteroscopy, Right - 04/14/2017 Percut Stone Removal >  2cm - 2013 Remove Kidney Stone - 2008       [facility] Notes: Catheter Ablation Atrial Fibrillation, Cystoscopy With Ureteroscopy With Lithotripsy, Cystoscopy With Insertion Of Ureteral Stent Right, Percutaneous Lithotomy For Stone Over 2cm., Cholecystectomy, Knee Surgery, Lithotomy   NON-GU PSH: Back surgery Cholecystectomy (open) - 2008     GU PMH: Renal and ureteral calculus, Questionable bladder calculi on  u/s today but far from definitive based on my review. No obvious hydro. An obvious ureteral calculi is not well seen on KUB. Pt remains without symptoms suggestive of acute obstruction and voiding at his baseline. His renal function remains stable. I'll send a message to Dr Jeffie Pollock for further review and then have the pt. contacted with recommended plan of care moving forward. - 02/22/2019 BPH w/LUTS (Stable), He has a reduced stream but is content with his voiding function. - 10/13/2018 Renal calculus (Improving), He has a small LLP stone and no recurrent bladder stones. F/U in 1 year for a KUB. - 10/13/2018, (Stable), - 06/10/2017, Bilateral, He is doing well without complaints. I will have him return in a year with a KUB and UA. , - 2018, Kidney stone on left side, - 2016, Kidney stone on right side, - 2016 Weak Urinary Stream (Stable) - 10/13/2018 Renal cyst (Stable) - 06/10/2017, Renal cyst, acquired, right, - 2016 Flank Pain, I think pain is more musculoskeletal possibly related to his back with DDD or his hip with arthritis. - 2019 Other microscopic hematuria, Microscopic hematuria - 2016 Ureteral calculus, Calculus of ureter - 2016 Bladder Stone, Bladder calculus - 2016 Proteinuria, Isolated proteinuria - 2016 ED due to arterial insufficiency, Erectile dysfunction due to arterial insufficiency - 2014 BPH w/o LUTS, Benign prostatic hypertrophy without lower urinary tract symptoms - 2014 Gross hematuria, Gross Hematuria - 2014 History of urolithiasis, Nephrolithiasis - 2014 Hydronephrosis Unspec, Hydronephrosis - 2014 Personal Hx Oth male genital organs diseases, History of balanitis - 2014      PMH Notes:  2011-04-23 12:49:49 - Note: Nephrolithiasis Of The Left Kidney  2009-12-28 14:37:10 - Note: Arthritis   NON-GU PMH: Personal history of other diseases of the circulatory system, History of atrial fibrillation - 2016, History of hypertension, - 2014 Encounter for general adult medical  examination without abnormal findings, Encounter for preventive health examination - 2014 Personal history of other endocrine, nutritional and metabolic disease, History of hypercholesterolemia - 2014    FAMILY HISTORY: Acute Myocardial Infarction - Father Colon Cancer - Mother Death In The Family Father - Runs In Family Death In The Family Mother - Runs In Family Family Health Status Number - Runs In Family   SOCIAL HISTORY: Marital Status: Married Preferred Language: English; Race: White Patient's occupation is/was retired.     Notes: Former smoker, Alcohol Use, Tobacco Use, Occupation:, Caffeine Use, Marital History - Currently Married   REVIEW OF SYSTEMS:    GU Review Male:   Patient denies frequent urination, hard to postpone urination, burning/ pain with urination, get up at night to urinate, leakage of urine, stream starts and stops, trouble starting your stream, have to strain to urinate , erection problems, and penile pain.  Gastrointestinal (Upper):   Patient denies nausea, vomiting, and indigestion/ heartburn.  Gastrointestinal (Lower):   Patient denies diarrhea and constipation.  Constitutional:   Patient denies fever, night sweats, weight loss, and fatigue.  Skin:   Patient denies skin rash/ lesion and itching.  Eyes:   Patient denies blurred vision and double vision.  Ears/ Nose/ Throat:  Patient denies sore throat and sinus problems.  Hematologic/Lymphatic:   Patient denies swollen glands and easy bruising.  Cardiovascular:   Patient denies leg swelling and chest pains.  Respiratory:   Patient denies cough and shortness of breath.  Endocrine:   Patient denies excessive thirst.  Musculoskeletal:   Patient denies back pain and joint pain.  Neurological:   Patient denies headaches and dizziness.  Psychologic:   Patient denies depression and anxiety.   VITAL SIGNS:      04/06/2019 10:51 AM  BP 147/72 mmHg  Pulse 72 /min  Temperature 98.0 F / 36.6 C   MULTI-SYSTEM  PHYSICAL EXAMINATION:    Constitutional: Well-nourished. No physical deformities. Normally developed. Good grooming.  Respiratory: No labored breathing, no use of accessory muscles.      PAST DATA REVIEWED:  Source Of History:  Patient  Records Review:   AUA Symptom Score  Urine Test Review:   Urinalysis  X-Ray Review: Renal Ultrasound (Limited): Reviewed Films. Discussed With Patient.  C.T. Stone Protocol: Reviewed Films. Discussed With Patient.     12/21/09 08/10/08 11/11/06  PSA  Total PSA 0.86  1.25  0.76     PROCEDURES:         C.T. Urogram - [id number]  there are bilateral renal stones noted. there is a 5x7x2mm left proximal stone without obstruction. Renal cysts are noted. A full report is pending.       Patient confirmed No Neulasta OnPro Device.          Renal Ultrasound (Limited) [id number]  Kidney:LEFT Length:9.7 cm Depth:4.7 cm Cortical Width:.98 cm Width: 5.3 cm    Left Kidney/Ureter:  There is a non obstructing lower pole calc seen 3.5 mm as well as a mid pole cortical simple appearing cyst 1.6 cm  Bladder:  PVR 19,1 ml      . Patient confirmed No Neulasta OnPro Device.           Urinalysis w/Scope Dipstick Dipstick Cont'd Micro  Color: Yellow Bilirubin: Neg mg/dL WBC/hpf: NS (Not Seen)  Appearance: Clear Ketones: Neg mg/dL RBC/hpf: 3 - 10/hpf  Specific Gravity: 1.025 Blood: Neg ery/uL Bacteria: NS (Not Seen)  pH: <=5.0 Protein: 2+ mg/dL Cystals: NS (Not Seen)  Glucose: Neg mg/dL Urobilinogen: 1.0 mg/dL Casts: NS (Not Seen)    Nitrites: Neg Trichomonas: Not Present    Leukocyte Esterase: Neg leu/uL Mucous: Not Present      Epithelial Cells: NS (Not Seen)      Yeast: NS (Not Seen)      Sperm: Not Present    ASSESSMENT:      ICD-10 Details  1 GU:   Renal and ureteral calculus - N20.2 Chronic, Stable - He has bilateral renal stones and a large non-obstructing left proximal stone that is radiolucent and likely uric acid. I discussed options and will get him  set up for left URS. I have reviewed the risks of ureteroscopy including bleeding, infection, ureteral injury, need for a stent or secondary procedures, thrombotic events and anesthetic complications.    PLAN:           Orders X-Rays: C.T. Stone Protocol Without Contrast  X-Ray Notes: History:   Hematuria: Yes/No  Patient to see MD after exam: Yes/No  Previous exam: CT / IVP/ US/ KUB/ None  When:  Where:  Diabetic: Yes/ No  High Blood Pressure: Yes/ No  BUN/ Creatine:  Date of last BUN Creatinine:  Weight in pounds:  Allergy- Contrasts/ Shellfish: Yes/ No  Conflicting  diabetic meds: Yes/ No  Oral contrast and instructions given to patient:   Prior Authorization #: no auth required per [employer] ref # [id number]           Schedule Return Visit/Planned Activity: Next Available Appointment - Schedule Surgery  Procedure: Unspecified Date - Cysto Uretero Lithotripsy - [phone], left Notes: Next available.

## 2019-04-15 ENCOUNTER — Other Ambulatory Visit (HOSPITAL_COMMUNITY)
Admission: RE | Admit: 2019-04-15 | Discharge: 2019-04-15 | Disposition: A | Discharge status: Home / Self Care | Payer: Medicare Other | Source: Ambulatory Visit | Attending: Urology | Admitting: Urology

## 2019-04-15 DIAGNOSIS — Z01812 Encounter for preprocedural laboratory examination: Secondary | ICD-10-CM | POA: Insufficient documentation

## 2019-04-15 DIAGNOSIS — Z20822 Contact with and (suspected) exposure to covid-19: Secondary | ICD-10-CM | POA: Insufficient documentation

## 2019-04-15 LAB — SARS CORONAVIRUS 2 (TAT 6-24 HRS): SARS Coronavirus 2: NEGATIVE

## 2019-04-18 NOTE — Anesthesia Preprocedure Evaluation (Addendum)
Anesthesia Evaluation  Patient identified by MRN, date of birth, ID band Patient awake    Reviewed: Allergy & Precautions, NPO status , Patient's Chart, lab work & pertinent test results  Airway Mallampati: II  TM Distance: >3 FB Neck ROM: Full    Dental  (+) Edentulous Upper, Edentulous Lower   Pulmonary former smoker,    Pulmonary exam normal breath sounds clear to auscultation       Cardiovascular hypertension, Pt. on medications + CAD  Normal cardiovascular exam+ dysrhythmias (s/p ablation ) Supra Ventricular Tachycardia  Rhythm:Regular Rate:Normal  ECG: SB, rate 57   Neuro/Psych negative neurological ROS  negative psych ROS   GI/Hepatic Neg liver ROS, GERD  Medicated and Controlled,  Endo/Other  negative endocrine ROS  Renal/GU Renal disease     Musculoskeletal negative musculoskeletal ROS (+)   Abdominal   Peds  Hematology HLD   Anesthesia Other Findings LEFT PROXIMAL URETERAL AND RENAL STONES  Reproductive/Obstetrics                            Anesthesia Physical Anesthesia Plan  ASA: III  Anesthesia Plan: General   Post-op Pain Management:    Induction: Intravenous  PONV Risk Score and Plan: 3 and Dexamethasone, Ondansetron and Treatment may vary due to age or medical condition  Airway Management Planned: LMA  Additional Equipment:   Intra-op Plan:   Post-operative Plan: Extubation in OR  Informed Consent: I have reviewed the patients History and Physical, chart, labs and discussed the procedure including the risks, benefits and alternatives for the proposed anesthesia with the patient or authorized representative who has indicated his/her understanding and acceptance.       Plan Discussed with: CRNA  Anesthesia Plan Comments:        Anesthesia Quick Evaluation

## 2019-04-19 ENCOUNTER — Encounter (HOSPITAL_BASED_OUTPATIENT_CLINIC_OR_DEPARTMENT_OTHER)
Admission: RE | Disposition: A | Discharge status: Home / Self Care | Payer: Self-pay | Source: Home / Self Care | Attending: Urology

## 2019-04-19 ENCOUNTER — Ambulatory Visit (HOSPITAL_BASED_OUTPATIENT_CLINIC_OR_DEPARTMENT_OTHER)
Admission: RE | Admit: 2019-04-19 | Discharge: 2019-04-19 | Disposition: A | Discharge status: Home / Self Care | Payer: Medicare Other | Attending: Urology | Admitting: Urology

## 2019-04-19 ENCOUNTER — Ambulatory Visit (HOSPITAL_BASED_OUTPATIENT_CLINIC_OR_DEPARTMENT_OTHER): Payer: Medicare Other | Admitting: Physician Assistant

## 2019-04-19 ENCOUNTER — Encounter (HOSPITAL_BASED_OUTPATIENT_CLINIC_OR_DEPARTMENT_OTHER): Payer: Self-pay | Admitting: Urology

## 2019-04-19 DIAGNOSIS — Z79899 Other long term (current) drug therapy: Secondary | ICD-10-CM | POA: Diagnosis not present

## 2019-04-19 DIAGNOSIS — I129 Hypertensive chronic kidney disease with stage 1 through stage 4 chronic kidney disease, or unspecified chronic kidney disease: Secondary | ICD-10-CM | POA: Insufficient documentation

## 2019-04-19 DIAGNOSIS — Z87891 Personal history of nicotine dependence: Secondary | ICD-10-CM | POA: Insufficient documentation

## 2019-04-19 DIAGNOSIS — N183 Chronic kidney disease, stage 3 unspecified: Secondary | ICD-10-CM | POA: Diagnosis not present

## 2019-04-19 DIAGNOSIS — I10 Essential (primary) hypertension: Secondary | ICD-10-CM | POA: Diagnosis not present

## 2019-04-19 DIAGNOSIS — Z87442 Personal history of urinary calculi: Secondary | ICD-10-CM | POA: Insufficient documentation

## 2019-04-19 DIAGNOSIS — E785 Hyperlipidemia, unspecified: Secondary | ICD-10-CM | POA: Insufficient documentation

## 2019-04-19 DIAGNOSIS — I471 Supraventricular tachycardia: Secondary | ICD-10-CM | POA: Diagnosis not present

## 2019-04-19 DIAGNOSIS — I2510 Atherosclerotic heart disease of native coronary artery without angina pectoris: Secondary | ICD-10-CM | POA: Insufficient documentation

## 2019-04-19 DIAGNOSIS — N202 Calculus of kidney with calculus of ureter: Secondary | ICD-10-CM | POA: Diagnosis not present

## 2019-04-19 DIAGNOSIS — K219 Gastro-esophageal reflux disease without esophagitis: Secondary | ICD-10-CM | POA: Diagnosis not present

## 2019-04-19 HISTORY — DX: Other complications of anesthesia, initial encounter: T88.59XA

## 2019-04-19 HISTORY — PX: CYSTOSCOPY WITH RETROGRADE PYELOGRAM, URETEROSCOPY AND STENT PLACEMENT: SHX5789

## 2019-04-19 LAB — POCT I-STAT, CHEM 8
BUN: 20 mg/dL (ref 8–23)
Calcium, Ion: 1.22 mmol/L (ref 1.15–1.40)
Chloride: 106 mmol/L (ref 98–111)
Creatinine, Ser: 1.4 mg/dL — ABNORMAL HIGH (ref 0.61–1.24)
Glucose, Bld: 101 mg/dL — ABNORMAL HIGH (ref 70–99)
HCT: 34 % — ABNORMAL LOW (ref 39.0–52.0)
Hemoglobin: 11.6 g/dL — ABNORMAL LOW (ref 13.0–17.0)
Potassium: 4.8 mmol/L (ref 3.5–5.1)
Sodium: 141 mmol/L (ref 135–145)
TCO2: 29 mmol/L (ref 22–32)

## 2019-04-19 SURGERY — CYSTOURETEROSCOPY, WITH RETROGRADE PYELOGRAM AND STENT INSERTION
Anesthesia: General | Site: Pelvis | Laterality: Left

## 2019-04-19 MED ORDER — EPHEDRINE SULFATE-NACL 50-0.9 MG/10ML-% IV SOSY
PREFILLED_SYRINGE | INTRAVENOUS | Status: DC | PRN
  Administered 2019-04-19: 10 mg via INTRAVENOUS

## 2019-04-19 MED ORDER — HYOSCYAMINE SULFATE SL 0.125 MG SL SUBL: 1.0000 | SUBLINGUAL_TABLET | Freq: Four times a day (QID) | SUBLINGUAL | 1 refills | Status: DC | PRN

## 2019-04-19 MED ORDER — ONDANSETRON HCL 4 MG/2ML IJ SOLN
INTRAMUSCULAR | Status: DC | PRN
  Administered 2019-04-19: 4 mg via INTRAVENOUS

## 2019-04-19 MED ORDER — EPHEDRINE 5 MG/ML INJ
INTRAVENOUS | Status: AC
  Filled 2019-04-19: qty 10

## 2019-04-19 MED ORDER — LIDOCAINE 2% (20 MG/ML) 5 ML SYRINGE
INTRAMUSCULAR | Status: DC | PRN
  Administered 2019-04-19: 60 mg via INTRAVENOUS

## 2019-04-19 MED ORDER — SODIUM CHLORIDE 0.9% FLUSH
3.0000 mL | Freq: Two times a day (BID) | INTRAVENOUS | Status: DC
  Filled 2019-04-19: qty 3

## 2019-04-19 MED ORDER — IOHEXOL 300 MG/ML  SOLN
INTRAMUSCULAR | Status: DC | PRN
  Administered 2019-04-19: 5 mL via URETHRAL

## 2019-04-19 MED ORDER — CEFAZOLIN SODIUM-DEXTROSE 2-4 GM/100ML-% IV SOLN
2.0000 g | INTRAVENOUS | Status: AC
  Administered 2019-04-19: 08:00:00 2 g via INTRAVENOUS
  Filled 2019-04-19: qty 100

## 2019-04-19 MED ORDER — FENTANYL CITRATE (PF) 100 MCG/2ML IJ SOLN
25.0000 ug | INTRAMUSCULAR | Status: DC | PRN
  Filled 2019-04-19: qty 1

## 2019-04-19 MED ORDER — SODIUM CHLORIDE 0.9 % IV SOLN
INTRAVENOUS | Status: DC
  Filled 2019-04-19: qty 1000

## 2019-04-19 MED ORDER — ONDANSETRON HCL 4 MG/2ML IJ SOLN
INTRAMUSCULAR | Status: AC
  Filled 2019-04-19: qty 2

## 2019-04-19 MED ORDER — FENTANYL CITRATE (PF) 100 MCG/2ML IJ SOLN
INTRAMUSCULAR | Status: AC
  Filled 2019-04-19: qty 2

## 2019-04-19 MED ORDER — KETOROLAC TROMETHAMINE 30 MG/ML IJ SOLN
INTRAMUSCULAR | Status: DC | PRN
  Administered 2019-04-19: 30 mg via INTRAVENOUS

## 2019-04-19 MED ORDER — LIDOCAINE 2% (20 MG/ML) 5 ML SYRINGE
INTRAMUSCULAR | Status: AC
  Filled 2019-04-19: qty 5

## 2019-04-19 MED ORDER — OXYCODONE-ACETAMINOPHEN 5-325 MG PO TABS: 1.0000 | ORAL_TABLET | Freq: Four times a day (QID) | ORAL | 0 refills | Status: DC | PRN

## 2019-04-19 MED ORDER — ACETAMINOPHEN 500 MG PO TABS
1000.0000 mg | ORAL_TABLET | Freq: Once | ORAL | Status: AC
  Administered 2019-04-19: 06:00:00 1000 mg via ORAL
  Filled 2019-04-19: qty 2

## 2019-04-19 MED ORDER — SODIUM CHLORIDE 0.9 % IR SOLN
Status: DC | PRN
  Administered 2019-04-19: 3000 mL via INTRAVESICAL

## 2019-04-19 MED ORDER — KETOROLAC TROMETHAMINE 30 MG/ML IJ SOLN
INTRAMUSCULAR | Status: AC
  Filled 2019-04-19: qty 1

## 2019-04-19 MED ORDER — DEXAMETHASONE SODIUM PHOSPHATE 10 MG/ML IJ SOLN
INTRAMUSCULAR | Status: DC | PRN
  Administered 2019-04-19: 5 mg via INTRAVENOUS

## 2019-04-19 MED ORDER — PROPOFOL 10 MG/ML IV BOLUS
INTRAVENOUS | Status: AC
  Filled 2019-04-19: qty 40

## 2019-04-19 MED ORDER — PROPOFOL 10 MG/ML IV BOLUS
INTRAVENOUS | Status: DC | PRN
  Administered 2019-04-19: 150 mg via INTRAVENOUS

## 2019-04-19 MED ORDER — FENTANYL CITRATE (PF) 100 MCG/2ML IJ SOLN
INTRAMUSCULAR | Status: DC | PRN
  Administered 2019-04-19: 50 ug via INTRAVENOUS

## 2019-04-19 MED ORDER — DEXAMETHASONE SODIUM PHOSPHATE 10 MG/ML IJ SOLN
INTRAMUSCULAR | Status: AC
  Filled 2019-04-19: qty 1

## 2019-04-19 MED ORDER — ACETAMINOPHEN 500 MG PO TABS
ORAL_TABLET | ORAL | Status: AC
  Filled 2019-04-19: qty 2

## 2019-04-19 MED ORDER — ONDANSETRON HCL 4 MG/2ML IJ SOLN
4.0000 mg | Freq: Once | INTRAMUSCULAR | Status: DC | PRN
  Filled 2019-04-19: qty 2

## 2019-04-19 MED ORDER — CEFAZOLIN SODIUM-DEXTROSE 2-4 GM/100ML-% IV SOLN
INTRAVENOUS | Status: AC
  Filled 2019-04-19: qty 100

## 2019-04-19 SURGICAL SUPPLY — 28 items
BAG DRAIN URO-CYSTO SKYTR STRL (DRAIN) ×3 IMPLANT
BAG DRN UROCATH (DRAIN) ×1
BASKET STONE 1.7 NGAGE (UROLOGICAL SUPPLIES) ×2 IMPLANT
BASKET ZERO TIP NITINOL 2.4FR (BASKET) IMPLANT
BSKT STON RTRVL ZERO TP 2.4FR (BASKET)
CATH URET 5FR 28IN CONE TIP (BALLOONS)
CATH URET 5FR 28IN OPEN ENDED (CATHETERS) ×2 IMPLANT
CATH URET 5FR 70CM CONE TIP (BALLOONS) IMPLANT
CLOTH BEACON ORANGE TIMEOUT ST (SAFETY) ×3 IMPLANT
DRSG TEGADERM 2-3/8X2-3/4 SM (GAUZE/BANDAGES/DRESSINGS) ×2 IMPLANT
ELECT REM PT RETURN 9FT ADLT (ELECTROSURGICAL)
ELECTRODE REM PT RTRN 9FT ADLT (ELECTROSURGICAL) IMPLANT
FIBER LASER FLEXIVA 365 (UROLOGICAL SUPPLIES) IMPLANT
FIBER LASER TRAC TIP (UROLOGICAL SUPPLIES) ×2 IMPLANT
GLOVE SURG SS PI 8.0 STRL IVOR (GLOVE) ×3 IMPLANT
GOWN STRL REUS W/TWL XL LVL3 (GOWN DISPOSABLE) ×3 IMPLANT
GUIDEWIRE ANG ZIPWIRE 038X150 (WIRE) IMPLANT
GUIDEWIRE STR DUAL SENSOR (WIRE) ×3 IMPLANT
IV NS IRRIG 3000ML ARTHROMATIC (IV SOLUTION) ×3 IMPLANT
KIT TURNOVER CYSTO (KITS) ×3 IMPLANT
MANIFOLD NEPTUNE II (INSTRUMENTS) ×3 IMPLANT
NS IRRIG 500ML POUR BTL (IV SOLUTION) ×3 IMPLANT
PACK CYSTO (CUSTOM PROCEDURE TRAY) ×3 IMPLANT
SHEATH URETERAL 12FRX35CM (MISCELLANEOUS) ×2 IMPLANT
STENT URET 6FRX24 CONTOUR (STENTS) ×2 IMPLANT
TUBE CONNECTING 12'X1/4 (SUCTIONS) ×1
TUBE CONNECTING 12X1/4 (SUCTIONS) ×2 IMPLANT
TUBING UROLOGY SET (TUBING) ×2 IMPLANT

## 2019-04-19 NOTE — Anesthesia Postprocedure Evaluation (Signed)
Anesthesia Post Note  Patient: Brandon Fischer  Procedure(s) Performed: CYSTOSCOPY WITH LEFT  RETROGRADE LEFT , URETEROSCOPY WITH HOLMIUM LASER, BASKET EXTRACTION AND STENT PLACEMENT (Left Pelvis)     Patient location during evaluation: PACU Anesthesia Type: General Level of consciousness: awake and alert Pain management: pain level controlled Vital Signs Assessment: post-procedure vital signs reviewed and stable Respiratory status: spontaneous breathing, nonlabored ventilation, respiratory function stable and patient connected to nasal cannula oxygen Cardiovascular status: blood pressure returned to baseline and stable Postop Assessment: no apparent nausea or vomiting Anesthetic complications: no    Last Vitals:  Vitals:   04/19/19 0915 04/19/19 0920  BP: (!) 152/66 (!) 155/68  Pulse: 60 61  Resp: 13 14  Temp:    SpO2: 98% 99%    Last Pain:  Vitals:   04/19/19 0945  TempSrc:   PainSc: 0-No pain                 [name]

## 2019-04-19 NOTE — Interval H&P Note (Signed)
History and Physical Interval Note:  04/19/2019 7:19 AM  Brandon Fischer  has presented today for surgery, with the diagnosis of LEFT PROXIMAL URETERAL AND RENAL STONES.  The various methods of treatment have been discussed with the patient and family. After consideration of risks, benefits and other options for treatment, the patient has consented to  Procedure(s): CYSTOSCOPY WITH LEFT  RETROGRADE LEFT , URETEROSCOPY WITH HILMIUM LASER AND STENT PLACEMENT (Left) as a surgical intervention.  The patient's history has been reviewed, patient examined, no change in status, stable for surgery.  I have reviewed the patient's chart and labs.  Questions were answered to the patient's satisfaction.     [name]

## 2019-04-19 NOTE — Discharge Instructions (Addendum)
Ureteral Stent Implantation, Care After This sheet gives you information about how to care for yourself after your procedure. Your health care provider may also give you more specific instructions. If you have problems or questions, contact your health care provider. What can I expect after the procedure? After the procedure, it is common to have:  Nausea.  Mild pain when you urinate. You may feel this pain in your lower back or lower abdomen. The pain should stop within a few minutes after you urinate. This may last for up to 1 week.  A small amount of blood in your urine for several days. Follow these instructions at home: Medicines  Take over-the-counter and prescription medicines only as told by your health care provider.  If you were prescribed an antibiotic medicine, take it as told by your health care provider. Do not stop taking the antibiotic even if you start to feel better.  Do not drive for 24 hours if you were given a sedative during your procedure.  Ask your health care provider if the medicine prescribed to you requires you to avoid driving or using heavy machinery. Activity  Rest as told by your health care provider.  Avoid sitting for a long time without moving. Get up to take short walks every 1-2 hours. This is important to improve blood flow and breathing. Ask for help if you feel weak or unsteady.  Return to your normal activities as told by your health care provider. Ask your health care provider what activities are safe for you. General instructions   Watch for any blood in your urine. Call your health care provider if the amount of blood in your urine increases.  If you have a catheter: ? Follow instructions from your health care provider about taking care of your catheter and collection bag. ? Do not take baths, swim, or use a hot tub until your health care provider approves. Ask your health care provider if you may take showers. You may only be allowed to  take sponge baths.  Drink enough fluid to keep your urine pale yellow.  Do not use any products that contain nicotine or tobacco, such as cigarettes, e-cigarettes, and chewing tobacco. These can delay healing after surgery. If you need help quitting, ask your health care provider.  Keep all follow-up visits as told by your health care provider. This is important. Contact a health care provider if:  You have pain that gets worse or does not get better with medicine, especially pain when you urinate.  You have difficulty urinating.  You feel nauseous or you vomit repeatedly during a period of more than 2 days after the procedure. Get help right away if:  Your urine is dark red or has blood clots in it.  You are leaking urine (have incontinence).  The end of the stent comes out of your urethra.  You cannot urinate.  You have sudden, sharp, or severe pain in your abdomen or lower back.  You have a fever.  You have swelling or pain in your legs.  You have difficulty breathing. Summary  After the procedure, it is common to have mild pain when you urinate that goes away within a few minutes after you urinate. This may last for up to 1 week.  Watch for any blood in your urine. Call your health care provider if the amount of blood in your urine increases.  Take over-the-counter and prescription medicines only as told by your health care provider.  Drink   enough fluid to keep your urine pale yellow.  You may remove the stent by pulling the attached string on Friday morning.  If you don't feel you can do that you may call the office to come have it removed or if you are tolerating it well, wait until your follow up on 4/14.   This information is not intended to replace advice given to you by your health care provider. Make sure you discuss any questions you have with your health care provider. Document Revised: 10/06/2017 Document Reviewed: 10/07/2017 Elsevier Patient Education   [place] Instructions  Activity: Get plenty of rest for the remainder of the day. A responsible individual must stay with you for 24 hours following the procedure.  For the next 24 hours, DO NOT: -Drive a car -Paediatric nurse -Drink alcoholic beverages -Take any medication unless instructed by your physician -Make any legal decisions or sign important papers.  Meals: Start with liquid foods such as gelatin or soup. Progress to regular foods as tolerated. Avoid greasy, spicy, heavy foods. If nausea and/or vomiting occur, drink only clear liquids until the nausea and/or vomiting subsides. Call your physician if vomiting continues.  Special Instructions/Symptoms: Your throat may feel dry or sore from the anesthesia or the breathing tube placed in your throat during surgery. If this causes discomfort, gargle with warm salt water. The discomfort should disappear within 24 hours.  Do not take any Tylenol until after 12:00 pm today. Do not take any nonsteroidal anti inflammatories until after 2:30 pm today.  Indwelling Urinary Catheter Care, Adult An indwelling urinary catheter is a thin tube that is put into your bladder. The tube helps to drain pee (urine) out of your body. The tube goes in through your urethra. Your urethra is where pee comes out of your body. Your pee will come out through the catheter, then it will go into a bag (drainage bag). Take good care of your catheter so it will work well. How to wear your catheter and bag Supplies needed  Sticky tape (adhesive tape) or a leg strap.  Alcohol wipe or soap and water (if you use tape).  A clean towel (if you use tape).  Large overnight bag.  Smaller bag (leg bag). Wearing your catheter Attach your catheter to your leg with tape or a leg strap.  Make sure the catheter is not pulled tight.  If a leg strap gets wet, take it off and put on a dry strap.  If you use tape to hold the bag  on your leg: 1. Use an alcohol wipe or soap and water to wash your skin where the tape made it sticky before. 2. Use a clean towel to pat-dry that skin. 3. Use new tape to make the bag stay on your leg. Wearing your bags You should have been given a large overnight bag.  You may wear the overnight bag in the day or night.  Always have the overnight bag lower than your bladder.  Do not let the bag touch the floor.  Before you go to sleep, put a clean plastic bag in a wastebasket. Then hang the overnight bag inside the wastebasket. You should also have a smaller leg bag that fits under your clothes.  Always wear the leg bag below your knee.  Do not wear your leg bag at night. How to care for your skin and catheter Supplies needed  A clean washcloth.  Water and mild soap.  A clean towel. Caring for your skin and catheter      Clean the skin around your catheter every day: 1. Wash your hands with soap and water. 2. Wet a clean washcloth in warm water and mild soap. 3. Clean the skin around your urethra.  If you are male:  Gently spread the folds of skin around your vagina (labia).  With the washcloth in your other hand, wipe the inner side of your labia on each side. Wipe from front to back.  If you are male:  Pull back any skin that covers the end of your penis (foreskin).  With the washcloth in your other hand, wipe your penis in small circles. Start wiping at the tip of your penis, then move away from the catheter.  Move the foreskin back in place, if needed. 4. With your free hand, hold the catheter close to where it goes into your body.  Keep holding the catheter during cleaning so it does not get pulled out. 5. With the washcloth in your other hand, clean the catheter.  Only wipe downward on the catheter.  Do not wipe upward toward your body. Doing this may push germs into your urethra and cause infection. 6. Use a clean towel to pat-dry the catheter and  the skin around it. Make sure to wipe off all soap. 7. Wash your hands with soap and water.  Shower every day. Do not take baths.  Do not use cream, ointment, or lotion on the area where the catheter goes into your body, unless your doctor tells you to.  Do not use powders, sprays, or lotions on your genital area.  Check your skin around the catheter every day for signs of infection. Check for: ? Redness, swelling, or pain. ? Fluid or blood. ? Warmth. ? Pus or a bad smell. How to empty the bag Supplies needed  Rubbing alcohol.  Gauze pad or cotton ball.  Tape or a leg strap. Emptying the bag Pour the pee out of your bag when it is ?- full, or at least 2-3 times a day. Do this for your overnight bag and your leg bag. 1. Wash your hands with soap and water. 2. Separate (detach) the bag from your leg. 3. Hold the bag over the toilet or a clean pail. Keep the bag lower than your hips and bladder. This is so the pee (urine) does not go back into the tube. 4. Open the pour spout. It is at the bottom of the bag. 5. Empty the pee into the toilet or pail. Do not let the pour spout touch any surface. 6. Put rubbing alcohol on a gauze pad or cotton ball. 7. Use the gauze pad or cotton ball to clean the pour spout. 8. Close the pour spout. 9. Attach the bag to your leg with tape or a leg strap. 10. Wash your hands with soap and water. Follow instructions for cleaning the drainage bag:  From the product maker.  As told by your doctor. How to change the bag Supplies needed  Alcohol wipes.  A clean bag.  Tape or a leg strap. Changing the bag Replace your bag when it starts to leak, smell bad, or look dirty. 1. Wash your hands with soap and water. 2. Separate the dirty bag from your leg. 3. Pinch the catheter with your fingers so that pee does not spill out. 4. Separate the catheter tube from the bag tube where these tubes connect (at the connection valve).  Do not let the tubes  touch any surface. 5. Clean the end of the catheter tube with an alcohol wipe. Use a different alcohol wipe to clean the end of the bag tube. 6. Connect the catheter tube to the tube of the clean bag. 7. Attach the clean bag to your leg with tape or a leg strap. Do not make the bag tight on your leg. 8. Wash your hands with soap and water. General rules   Never pull on your catheter. Never try to take it out. Doing that can hurt you.  Always wash your hands before and after you touch your catheter or bag. Use a mild, fragrance-free soap. If you do not have soap and water, use hand sanitizer.  Always make sure there are no twists or bends (kinks) in the catheter tube.  Always make sure there are no leaks in the catheter or bag.  Drink enough fluid to keep your pee pale yellow.  Do not take baths, swim, or use a hot tub.  If you are male, wipe from front to back after you poop (have a bowel movement). Contact a doctor if:  Your pee is cloudy.  Your pee smells worse than usual.  Your catheter gets clogged.  Your catheter leaks.  Your bladder feels full. Get help right away if:  You have redness, swelling, or pain where the catheter goes into your body.  You have fluid, blood, pus, or a bad smell coming from the area where the catheter goes into your body.  Your skin feels warm where the catheter goes into your body.  You have a fever.  You have pain in your: ? Belly (abdomen). ? Legs. ? Lower back. ? Bladder.  You see blood in the catheter.  Your pee is pink or red.  You feel sick to your stomach (nauseous).  You throw up (vomit).  You have chills.  Your pee is not draining into the bag.  Your catheter gets pulled out. Summary  An indwelling urinary catheter is a thin tube that is placed into the bladder to help drain pee (urine) out of the body.  The catheter is placed into the part of the body that drains pee from the bladder (urethra).  Taking good  care of your catheter will keep it working properly and help prevent problems.  Always wash your hands before and after touching your catheter or bag.  Never pull on your catheter or try to take it out. This information is not intended to replace advice given to you by your health care provider. Make sure you discuss any questions you have with your health care provider. Document Revised: 04/23/2018 Document Reviewed: 08/15/2016 Elsevier Patient Education  [place] (04/20/19). DO NOT REMOVE STENT UNTIL Friday MORNING (04/22/19).

## 2019-04-19 NOTE — Anesthesia Procedure Notes (Signed)
Procedure Name: LMA Insertion Date/Time: 04/19/2019 7:32 AM Performed by: Suan Halter, CRNA Pre-anesthesia Checklist: Patient identified, Emergency Drugs available, Suction available and Patient being monitored Patient Re-evaluated:Patient Re-evaluated prior to induction Oxygen Delivery Method: Circle system utilized Preoxygenation: Pre-oxygenation with 100% oxygen Induction Type: IV induction Ventilation: Mask ventilation without difficulty LMA: LMA inserted LMA Size: 5.0 Number of attempts: 1 Airway Equipment and Method: Bite block Placement Confirmation: positive ETCO2 Tube secured with: Tape Dental Injury: Teeth and Oropharynx as per pre-operative assessment

## 2019-04-19 NOTE — Transfer of Care (Signed)
Immediate Anesthesia Transfer of Care Note  Patient: Brandon Fischer  Procedure(s) Performed: Procedure(s) (LRB): CYSTOSCOPY WITH LEFT  RETROGRADE LEFT , URETEROSCOPY WITH HOLMIUM LASER, BASKET EXTRACTION AND STENT PLACEMENT (Left)  Patient Location: PACU  Anesthesia Type: General  Level of Consciousness: awake, oriented, sedated and patient cooperative  Airway & Oxygen Therapy: Patient Spontanous Breathing and Patient connected to face mask oxygen  Post-op Assessment: Report given to PACU RN and Post -op Vital signs reviewed and stable  Post vital signs: Reviewed and stable  Complications: No apparent anesthesia complications Last Vitals:  Vitals Value Taken Time  BP 158/65 04/19/19 0845  Temp 36.4 C 04/19/19 0843  Pulse 58 04/19/19 0856  Resp 12 04/19/19 0856  SpO2 99 % 04/19/19 0856  Vitals shown include unvalidated device data.  Last Pain:  Vitals:   04/19/19 0843  TempSrc:   PainSc: 0-No pain      Patients Stated Pain Goal: 5 (04/19/19 0843)

## 2019-04-19 NOTE — Op Note (Signed)
Procedure: 1.  Cystoscopy with left retrograde pyelogram and interpretation. 2.  Left ureteroscopy with holmium laser application, stone retrieval and insertion of left double-J stent. 3.  Application of fluoroscopy.  Preop diagnosis: Left proximal ureteral and renal stones.  Postop diagnosis: Same.  Surgeon: Dr. [name].  Anesthesia: General.  Specimen: Stone fragments.  Drains: 6 [place] by 24 cm left contour double-J stent with tether.  EBL: None.  Complications: None.  Indications: Brandon Fischer is an 86 year old male with approximately a 9 mm left proximal ureteral stone with mild obstruction and small renal stones.  The stones are poorly radiopaque on plain imaging it was felt the ureteroscopy was indicated.  Procedure: He was taken the operating room where general anesthetic was induced.  He was given 2 g of Ancef.  He was placed in lithotomy position and fitted with PAS hose.  His perineum and genitalia were prepped with Betadine solution he was draped in usual sterile fashion.  Cystoscopy was performed using a 23 [place] scope and 30 degree lens.  Examination revealed a normal urethra.  The external sphincter was intact.  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  Examination of the bladder demonstrated moderate trabeculation with some cellules.  The ureteral orifices were unremarkable.  No mucosal lesions were identified.  A left retrograde pyelogram was performed with a 5 [place] open-ended catheter and Omnipaque.  The left retrograde pyelogram revealed a normal caliber ureter to the level of the stone in the proximal ureter with some dilation around the stone but minimal hydronephrosis.  There was a filling defect associated with the stone.  A sensor wire was advanced to the kidney and the cystoscope was removed.  The inner core of a 35 cm digital access sheath was then advanced to the kidney without difficulty.  The  assembled 12/14 [place] access sheath was then advanced over the wire to the kidney.  The inner core and wire were removed.  The dual-lumen digital flexible scope was then passed to the kidney and several smaller intrarenal stones were identified and removed with the engage basket.  The larger stone was then the upper pole calyx along with second stone that was too large to remove intact.  There was an additional stone in the lower pole that was too large to remove intact and it was relocated to the upper pole to ease laser therapy.  The larger stones were then fragmented with a 200 m tract tip laser initially set on 0.5 J and 20 Hz with an increase in the frequency to 53 Hz to aid fragmentation.  Once the stone was adequately fragmented all significant fragments were removed with the engage basket.  Final inspection of all the calyces demonstrated only dust and small fragments that were not large enough to retrieve with the basket.  The ureteroscope was then backed out after the wire had been replaced to the kidney and the ureter was inspected as the sheath was removed.  No ureteral injury was identified.  The cystoscope was then reinserted over the wire and a 6 [place] by 24 cm contour double-J stent was advanced the left kidney under fluoroscopic guidance.  The wire was removed, leaving a good coil in the kidney and a good coil in the bladder.  The bladder was drained and the cystoscope was removed leaving the stent string exiting the urethra.  The string was secured to the patient's penis and final fluoroscopy demonstrated good position of the  stent.  He was then taken down from lithotomy position, his anesthetic was reversed and he was moved to recovery in stable condition.  There were no complications.  The stone fragments were given to his wife to bring to the office at follow-up.

## 2019-04-27 DIAGNOSIS — N202 Calculus of kidney with calculus of ureter: Secondary | ICD-10-CM | POA: Diagnosis not present

## 2019-04-27 DIAGNOSIS — R3915 Urgency of urination: Secondary | ICD-10-CM | POA: Diagnosis not present

## 2019-04-27 DIAGNOSIS — R3912 Poor urinary stream: Secondary | ICD-10-CM | POA: Diagnosis not present

## 2019-05-03 DIAGNOSIS — I10 Essential (primary) hypertension: Secondary | ICD-10-CM | POA: Diagnosis not present

## 2019-05-03 DIAGNOSIS — E7800 Pure hypercholesterolemia, unspecified: Secondary | ICD-10-CM | POA: Diagnosis not present

## 2019-05-03 DIAGNOSIS — D509 Iron deficiency anemia, unspecified: Secondary | ICD-10-CM | POA: Diagnosis not present

## 2019-05-06 DIAGNOSIS — K219 Gastro-esophageal reflux disease without esophagitis: Secondary | ICD-10-CM | POA: Diagnosis not present

## 2019-05-06 DIAGNOSIS — L858 Other specified epidermal thickening: Secondary | ICD-10-CM | POA: Diagnosis not present

## 2019-05-06 DIAGNOSIS — I10 Essential (primary) hypertension: Secondary | ICD-10-CM | POA: Diagnosis not present

## 2019-05-06 DIAGNOSIS — Z1212 Encounter for screening for malignant neoplasm of rectum: Secondary | ICD-10-CM | POA: Diagnosis not present

## 2019-05-06 DIAGNOSIS — N400 Enlarged prostate without lower urinary tract symptoms: Secondary | ICD-10-CM | POA: Diagnosis not present

## 2019-05-06 DIAGNOSIS — I2510 Atherosclerotic heart disease of native coronary artery without angina pectoris: Secondary | ICD-10-CM | POA: Diagnosis not present

## 2019-05-06 DIAGNOSIS — Z0001 Encounter for general adult medical examination with abnormal findings: Secondary | ICD-10-CM | POA: Diagnosis not present

## 2019-05-06 DIAGNOSIS — G4733 Obstructive sleep apnea (adult) (pediatric): Secondary | ICD-10-CM | POA: Diagnosis not present

## 2019-05-06 DIAGNOSIS — R161 Splenomegaly, not elsewhere classified: Secondary | ICD-10-CM | POA: Diagnosis not present

## 2019-05-06 DIAGNOSIS — D649 Anemia, unspecified: Secondary | ICD-10-CM | POA: Diagnosis not present

## 2019-05-06 DIAGNOSIS — Z87891 Personal history of nicotine dependence: Secondary | ICD-10-CM | POA: Diagnosis not present

## 2019-05-06 DIAGNOSIS — Z8679 Personal history of other diseases of the circulatory system: Secondary | ICD-10-CM | POA: Diagnosis not present

## 2019-05-10 DIAGNOSIS — C44629 Squamous cell carcinoma of skin of left upper limb, including shoulder: Secondary | ICD-10-CM | POA: Diagnosis not present

## 2019-05-26 DIAGNOSIS — N200 Calculus of kidney: Secondary | ICD-10-CM | POA: Diagnosis not present

## 2019-06-30 DIAGNOSIS — Z961 Presence of intraocular lens: Secondary | ICD-10-CM | POA: Diagnosis not present

## 2019-06-30 DIAGNOSIS — H349 Unspecified retinal vascular occlusion: Secondary | ICD-10-CM | POA: Diagnosis not present

## 2019-06-30 DIAGNOSIS — H5201 Hypermetropia, right eye: Secondary | ICD-10-CM | POA: Diagnosis not present

## 2019-06-30 DIAGNOSIS — H35373 Puckering of macula, bilateral: Secondary | ICD-10-CM | POA: Diagnosis not present

## 2019-07-11 DIAGNOSIS — F329 Major depressive disorder, single episode, unspecified: Secondary | ICD-10-CM | POA: Diagnosis not present

## 2019-07-11 DIAGNOSIS — F5104 Psychophysiologic insomnia: Secondary | ICD-10-CM | POA: Diagnosis not present

## 2019-07-11 DIAGNOSIS — F4321 Adjustment disorder with depressed mood: Secondary | ICD-10-CM | POA: Diagnosis not present

## 2019-07-17 ENCOUNTER — Other Ambulatory Visit: Payer: Self-pay

## 2019-07-17 ENCOUNTER — Emergency Department (HOSPITAL_COMMUNITY): Payer: Medicare Other

## 2019-07-17 ENCOUNTER — Emergency Department (HOSPITAL_COMMUNITY)
Admission: EM | Admit: 2019-07-17 | Discharge: 2019-07-18 | Disposition: A | Discharge status: Home / Self Care | Payer: Medicare Other | Attending: Emergency Medicine | Admitting: Emergency Medicine

## 2019-07-17 DIAGNOSIS — I2510 Atherosclerotic heart disease of native coronary artery without angina pectoris: Secondary | ICD-10-CM | POA: Diagnosis not present

## 2019-07-17 DIAGNOSIS — M4802 Spinal stenosis, cervical region: Secondary | ICD-10-CM | POA: Diagnosis not present

## 2019-07-17 DIAGNOSIS — Z87891 Personal history of nicotine dependence: Secondary | ICD-10-CM | POA: Diagnosis not present

## 2019-07-17 DIAGNOSIS — Z79899 Other long term (current) drug therapy: Secondary | ICD-10-CM | POA: Diagnosis not present

## 2019-07-17 DIAGNOSIS — I6782 Cerebral ischemia: Secondary | ICD-10-CM | POA: Diagnosis not present

## 2019-07-17 DIAGNOSIS — I10 Essential (primary) hypertension: Secondary | ICD-10-CM | POA: Diagnosis not present

## 2019-07-17 DIAGNOSIS — F4321 Adjustment disorder with depressed mood: Secondary | ICD-10-CM

## 2019-07-17 DIAGNOSIS — W19XXXA Unspecified fall, initial encounter: Secondary | ICD-10-CM

## 2019-07-17 DIAGNOSIS — R531 Weakness: Secondary | ICD-10-CM | POA: Diagnosis present

## 2019-07-17 MED ORDER — SODIUM CHLORIDE 0.9% FLUSH
3.0000 mL | Freq: Once | INTRAVENOUS | Status: AC
  Administered 2019-07-17: 3 mL via INTRAVENOUS

## 2019-07-17 MED ORDER — SODIUM CHLORIDE 0.9 % IV SOLN: INTRAVENOUS | Status: DC

## 2019-07-17 NOTE — ED Provider Notes (Signed)
TIME SEEN: 11:55 PM  CHIEF COMPLAINT: Generalized weakness, fall  HPI: Patient is an 86 year old male with history of first-degree AV block, CAD, CKD, hypertension, hyperlipidemia who presents to the emergency department after he fell at home.  He states he is not sure why he fell but denies hitting his head.  States he was on the floor for an hour before his daughter helped get him up.  He usually ambulates with a cane.  He states that he has been feeling weak since his wife died on June 28, 2022.  He denies fevers, cough, nausea, vomiting, diarrhea, chest pain, shortness of breath, headache, numbness, focal weakness, bloody stools, melena, dysuria.  ROS: See HPI Constitutional: no fever  Eyes: no drainage  ENT: no runny nose   Cardiovascular:  no chest pain  Resp: no SOB  GI: no vomiting GU: no dysuria Integumentary: no rash  Allergy: no hives  Musculoskeletal: no leg swelling  Neurological: no slurred speech ROS otherwise negative  PAST MEDICAL HISTORY/PAST SURGICAL HISTORY:  Past Medical History:  Diagnosis Date  . Bilateral carotid artery stenosis without cerebral infarction    ASYMPTOMATIC--  BILATERAL ICA  50-69%  PER CARDIOLOGIST NOTE, DR Einar Gip  . Bilateral carotid bruits    LEFT  soft bruit, right no no carotid bruit per 07-24-2014 dr Einar Gip note  . CKD (chronic kidney disease)    dr foster [place] kidney, ckd stage 3 per lov 02-16-2019  . Complication of anesthesia    1 episode atrial fib 02-09-2014 [facility] and followed up with cardiology, not further issue  . Coronary artery disease    NON-OBSTRUCTIVE CAD  per cath 08-06-2004 HX PALPITATIONS-AND -TACHYCARDIA-  . First degree heart block   . Full dentures   . GERD (gastroesophageal reflux disease)   . Heart palpitations    Not any more  . History of atrial fibrillation without current medication    EPISODE OF AFIB WITH RVR INTRAOPERATIVELY 02-09-2014 AT [facility]--  RESOLVED AND PT FOLLOWED UP WITH CARDIOLOGIST  DR Einar Gip  . History  of kidney stones   . Hyperlipidemia   . Hypertension   . Nephrolithiasis    RIGHT  . Organic impotence   . Psoriasis    Clearing up  . PSVT (paroxysmal supraventricular tachycardia) ([place])   . Right ureteral calculus   . S/P radiofrequency ablation operation for arrhythmia-SVT 04/28/2014  . Simple renal cyst    right  . Wears glasses   . Wears hearing aid    BILATERAL    MEDICATIONS:  Prior to Admission medications   Medication Sig Start Date End Date Taking? Authorizing Provider  amLODipine (NORVASC) 10 MG tablet Take 10 mg by mouth daily.  05/10/18   [provider]  Ascorbic Acid (VITAMIN C) 100 MG tablet Take 100 mg by mouth daily.    [provider]  atorvastatin (LIPITOR) 20 MG tablet Take 20 mg by mouth every evening.     [provider]  b complex vitamins tablet Take 1 tablet by mouth daily.    [provider]  diphenhydramine-acetaminophen (TYLENOL PM) 25-500 MG TABS Take 2 tablets by mouth at bedtime as needed.     [provider]  Hyoscyamine Sulfate SL (LEVSIN/SL) 0.125 MG SUBL Place 1 tablet under the tongue every 6 (six) hours as needed (spasms and bladder pain). 04/19/19   [name], MD  oxyCODONE-acetaminophen (PERCOCET) 5-325 MG tablet Take 1 tablet by mouth every 6 (six) hours as needed for severe pain. 04/19/19 04/18/20  [name], MD  pantoprazole (PROTONIX) 40 MG tablet Take 40 mg by mouth at bedtime.     [provider]  UNABLE TO FIND Med Name: vitamin d 3 1000 units daily    [provider]    ALLERGIES:  No Known Allergies  SOCIAL HISTORY:  Social History   Tobacco Use  . Smoking status: Former Smoker    Packs/day: 1.00    Years: 40.00    Pack years: 40.00    Types: Cigarettes    Quit date: 02/06/1998    Years since quitting: 21.4  . Smokeless tobacco: Never Used  Substance Use Topics  . Alcohol use: No    FAMILY HISTORY: Family History  Problem Relation Age of Onset  . Cancer  Mother        Ovarian or colon  . Heart attack Father   . Heart attack Brother     EXAM: BP (!) 160/67 (BP Location: Left Arm)   Pulse 68   Temp 98 F (36.7 C) (Oral)   Resp 17   Ht 5' 10.5" (1.791 m)   Wt 88.5 kg   SpO2 100%   BMI 27.58 kg/m  CONSTITUTIONAL: Alert and oriented and responds appropriately to questions. Well-appearing; well-nourished; GCS 15 HEAD: Normocephalic; atraumatic EYES: Conjunctivae clear, PERRL, EOMI ENT: normal nose; no rhinorrhea; moist mucous membranes; pharynx without lesions noted; no dental injury; no septal hematoma NECK: Supple, no meningismus, no LAD; no midline spinal tenderness, step-off or deformity; trachea midline CARD: RRR; S1 and S2 appreciated; no murmurs, no clicks, no rubs, no gallops RESP: Normal chest excursion without splinting or tachypnea; breath sounds clear and equal bilaterally; no wheezes, no rhonchi, no rales; no hypoxia or respiratory distress CHEST:  chest wall stable, no crepitus or ecchymosis or deformity, nontender to palpation; no flail chest ABD/GI: Normal bowel sounds; non-distended; soft, non-tender, no rebound, no guarding; no ecchymosis or other lesions noted PELVIS:  stable, nontender to palpation BACK:  The back appears normal and is non-tender to palpation, there is no CVA tenderness; no midline spinal tenderness, step-off or deformity EXT: Normal ROM in all joints; non-tender to palpation; no edema; normal capillary refill; no cyanosis, no bony tenderness or bony deformity of patient's extremities, no joint effusion, compartments are soft, extremities are warm and well-perfused, no ecchymosis SKIN: Normal color for age and race; warm NEURO: Moves all extremities equally, slightly diminished strength in all 4 extremities, normal sensation diffusely, no drift, cranial nerves II through XII intact, normal speech PSYCH: The patient's mood and manner are appropriate. Grooming and personal hygiene are  appropriate.  MEDICAL DECISION MAKING: Patient here with generalized weakness since his wife died.  I suspect that he has a prolonged grief reaction but will check labs, urine, head CT, chest x-ray today.  EKG shows first-degree AV block which she has a history of but no other arrhythmia or interval abnormality.  Give gentle IV hydration here in the ED.  ED PROGRESS: Patient's work-up has been reassuring.  He does have blood in his urine but this was a catheterized urine specimen.  No other sign of infection.  He does have calcium oxalate crystals but denies flank pain, abdominal pain or other urinary symptoms.  Chest x-ray and head CT showed no acute abnormality.  Patient able to ambulate here with quick and steady gait using a walker.  Daughter reports he uses a cane at home but she agrees that he would benefit from a walker.  We were able to  provide him with a walker here from the ED to take home and also provided him with a prescription for the same.  Discussed with daughter that I suspect that this is a grief reaction causing him to feel poorly for the past couple of months and she agrees.  I have urged him to follow-up with his primary care provider.  He denies SI or HI.  I do not feel he needs emergent psychiatric evaluation.  Patient reports feeling better, smiling upon discharge.  At this time, I do not feel there is any life-threatening condition present. I have reviewed, interpreted and discussed all results (EKG, imaging, lab, urine as appropriate) and exam findings with patient/family. I have reviewed nursing notes and appropriate previous records.  I feel the patient is safe to be discharged home without further emergent workup and can continue workup as an outpatient as needed. Discussed usual and customary return precautions. Patient/family verbalize understanding and are comfortable with this plan.  Outpatient follow-up has been provided as needed. All questions have been answered.     EKG  Interpretation  Date/Time:  Monday July 18 2019 00:16:40 EDT Ventricular Rate:  70 PR Interval:    QRS Duration: 103 QT Interval:  433 QTC Calculation: 468 R Axis:   -16 Text Interpretation: Sinus rhythm with 1st degree A-V block Borderline left axis deviation Confirmed by Pryor Curia [id number]) on 07/18/2019 1:02:55 AM        Mallie Mussel was evaluated in Emergency Department on 07/17/2019 for the symptoms described in the history of present illness. He was evaluated in the context of the global COVID-19 pandemic, which necessitated consideration that the patient might be at risk for infection with the SARS-CoV-2 virus that causes COVID-19. Institutional protocols and algorithms that pertain to the evaluation of patients at risk for COVID-19 are in a state of rapid change based on information released by regulatory bodies including the CDC and federal and state organizations. These policies and algorithms were followed during the patient's care in the ED.       Zaylen Susman, Delice Bison, DO 07/18/19 [phone]

## 2019-07-17 NOTE — ED Triage Notes (Signed)
Patient states that he fell and was on the floor for about an hour. His daughter helped him up and brought him to the ED. Patient speech was slow not slurred. Patient had trouble getting up and could not hardly walk.

## 2019-07-18 ENCOUNTER — Encounter (HOSPITAL_COMMUNITY): Payer: Self-pay | Admitting: Emergency Medicine

## 2019-07-18 ENCOUNTER — Emergency Department (HOSPITAL_COMMUNITY): Payer: Medicare Other

## 2019-07-18 DIAGNOSIS — I6782 Cerebral ischemia: Secondary | ICD-10-CM | POA: Diagnosis not present

## 2019-07-18 DIAGNOSIS — M4802 Spinal stenosis, cervical region: Secondary | ICD-10-CM | POA: Diagnosis not present

## 2019-07-18 DIAGNOSIS — R531 Weakness: Secondary | ICD-10-CM | POA: Diagnosis not present

## 2019-07-18 LAB — URINALYSIS, ROUTINE W REFLEX MICROSCOPIC
Bacteria, UA: NONE SEEN
Bilirubin Urine: NEGATIVE
Glucose, UA: NEGATIVE mg/dL
Ketones, ur: NEGATIVE mg/dL
Leukocytes,Ua: NEGATIVE
Nitrite: NEGATIVE
Protein, ur: 30 mg/dL — AB
RBC / HPF: >50 RBC/hpf — ABNORMAL HIGH (ref 0–5)
Specific Gravity, Urine: 1.015 (ref 1.005–1.030)
pH: 5 (ref 5.0–8.0)

## 2019-07-18 LAB — DIFFERENTIAL
Abs Immature Granulocytes: 0.04 10*3/uL (ref 0.00–0.07)
Basophils Absolute: 0 10*3/uL (ref 0.0–0.1)
Basophils Relative: 0 %
Eosinophils Absolute: 0 10*3/uL (ref 0.0–0.5)
Eosinophils Relative: 0 %
Immature Granulocytes: 1 %
Lymphocytes Relative: 5 %
Lymphs Abs: 0.4 10*3/uL — ABNORMAL LOW (ref 0.7–4.0)
Monocytes Absolute: 0.5 10*3/uL (ref 0.1–1.0)
Monocytes Relative: 6 %
Neutro Abs: 7.2 10*3/uL (ref 1.7–7.7)
Neutrophils Relative %: 88 %

## 2019-07-18 LAB — COMPREHENSIVE METABOLIC PANEL
ALT: 15 U/L (ref 0–44)
AST: 18 U/L (ref 15–41)
Albumin: 4.4 g/dL (ref 3.5–5.0)
Alkaline Phosphatase: 108 U/L (ref 38–126)
Anion gap: 12 (ref 5–15)
BUN: 20 mg/dL (ref 8–23)
CO2: 25 mmol/L (ref 22–32)
Calcium: 9 mg/dL (ref 8.9–10.3)
Chloride: 102 mmol/L (ref 98–111)
Creatinine, Ser: 1.7 mg/dL — ABNORMAL HIGH (ref 0.61–1.24)
GFR calc Af Amer: 42 mL/min — ABNORMAL LOW (ref >60)
GFR calc non Af Amer: 36 mL/min — ABNORMAL LOW (ref >60)
Glucose, Bld: 135 mg/dL — ABNORMAL HIGH (ref 70–99)
Potassium: 3.6 mmol/L (ref 3.5–5.1)
Sodium: 139 mmol/L (ref 135–145)
Total Bilirubin: 2.1 mg/dL — ABNORMAL HIGH (ref 0.3–1.2)
Total Protein: 7 g/dL (ref 6.5–8.1)

## 2019-07-18 LAB — PROTIME-INR
INR: 1 (ref 0.8–1.2)
Prothrombin Time: 13.2 seconds (ref 11.4–15.2)

## 2019-07-18 LAB — I-STAT CHEM 8, ED
BUN: 17 mg/dL (ref 8–23)
Calcium, Ion: 1.18 mmol/L (ref 1.15–1.40)
Chloride: 100 mmol/L (ref 98–111)
Creatinine, Ser: 1.8 mg/dL — ABNORMAL HIGH (ref 0.61–1.24)
Glucose, Bld: 135 mg/dL — ABNORMAL HIGH (ref 70–99)
HCT: 30 % — ABNORMAL LOW (ref 39.0–52.0)
Hemoglobin: 10.2 g/dL — ABNORMAL LOW (ref 13.0–17.0)
Potassium: 3.6 mmol/L (ref 3.5–5.1)
Sodium: 139 mmol/L (ref 135–145)
TCO2: 26 mmol/L (ref 22–32)

## 2019-07-18 LAB — CBC
HCT: 31.5 % — ABNORMAL LOW (ref 39.0–52.0)
Hemoglobin: 10.5 g/dL — ABNORMAL LOW (ref 13.0–17.0)
MCH: 26.5 pg (ref 26.0–34.0)
MCHC: 33.3 g/dL (ref 30.0–36.0)
MCV: 79.5 fL — ABNORMAL LOW (ref 80.0–100.0)
Platelets: 172 10*3/uL (ref 150–400)
RBC: 3.96 MIL/uL — ABNORMAL LOW (ref 4.22–5.81)
RDW: 16.2 % — ABNORMAL HIGH (ref 11.5–15.5)
WBC: 8.1 10*3/uL (ref 4.0–10.5)
nRBC: 0 % (ref 0.0–0.2)

## 2019-07-18 LAB — CBG MONITORING, ED: Glucose-Capillary: 124 mg/dL — ABNORMAL HIGH (ref 70–99)

## 2019-07-18 LAB — APTT: aPTT: 28 seconds (ref 24–36)

## 2019-07-18 LAB — T4, FREE: Free T4: 0.99 ng/dL (ref 0.61–1.12)

## 2019-07-18 LAB — MAGNESIUM: Magnesium: 1.7 mg/dL (ref 1.7–2.4)

## 2019-07-18 LAB — TSH: TSH: 5.133 u[IU]/mL — ABNORMAL HIGH (ref 0.350–4.500)

## 2019-07-18 LAB — CK: Total CK: 70 U/L (ref 49–397)

## 2019-07-18 NOTE — ED Notes (Signed)
Pt was able to ambulate without difficulty with walker. Dr.Ward made aware.

## 2019-07-18 NOTE — ED Notes (Signed)
Pt and family instructed on need for urine sample.

## 2019-07-18 NOTE — Discharge Instructions (Signed)
Your labs, imaging, urine, EKG today were reassuring.  I recommend that you use your walker at all times to get around.  I recommend follow-up with your primary care physician.

## 2019-07-20 LAB — T3, FREE: T3, Free: 3.2 pg/mL (ref 2.0–4.4)

## 2019-07-23 ENCOUNTER — Encounter (HOSPITAL_BASED_OUTPATIENT_CLINIC_OR_DEPARTMENT_OTHER): Payer: Self-pay | Admitting: Emergency Medicine

## 2019-07-23 ENCOUNTER — Emergency Department (HOSPITAL_BASED_OUTPATIENT_CLINIC_OR_DEPARTMENT_OTHER)
Admission: EM | Admit: 2019-07-23 | Discharge: 2019-07-23 | Disposition: A | Discharge status: Home / Self Care | Payer: Medicare Other | Attending: Emergency Medicine | Admitting: Emergency Medicine

## 2019-07-23 ENCOUNTER — Emergency Department (HOSPITAL_BASED_OUTPATIENT_CLINIC_OR_DEPARTMENT_OTHER): Payer: Medicare Other

## 2019-07-23 ENCOUNTER — Other Ambulatory Visit: Payer: Self-pay

## 2019-07-23 DIAGNOSIS — Z87891 Personal history of nicotine dependence: Secondary | ICD-10-CM | POA: Diagnosis not present

## 2019-07-23 DIAGNOSIS — N183 Chronic kidney disease, stage 3 unspecified: Secondary | ICD-10-CM | POA: Diagnosis not present

## 2019-07-23 DIAGNOSIS — R05 Cough: Secondary | ICD-10-CM | POA: Insufficient documentation

## 2019-07-23 DIAGNOSIS — Z79899 Other long term (current) drug therapy: Secondary | ICD-10-CM | POA: Insufficient documentation

## 2019-07-23 DIAGNOSIS — R0602 Shortness of breath: Secondary | ICD-10-CM | POA: Diagnosis not present

## 2019-07-23 DIAGNOSIS — R1310 Dysphagia, unspecified: Secondary | ICD-10-CM | POA: Insufficient documentation

## 2019-07-23 DIAGNOSIS — R059 Cough, unspecified: Secondary | ICD-10-CM

## 2019-07-23 DIAGNOSIS — I129 Hypertensive chronic kidney disease with stage 1 through stage 4 chronic kidney disease, or unspecified chronic kidney disease: Secondary | ICD-10-CM | POA: Insufficient documentation

## 2019-07-23 DIAGNOSIS — I7090 Unspecified atherosclerosis: Secondary | ICD-10-CM | POA: Diagnosis not present

## 2019-07-23 LAB — CBC WITH DIFFERENTIAL/PLATELET
Abs Immature Granulocytes: 0.03 10*3/uL (ref 0.00–0.07)
Basophils Absolute: 0 10*3/uL (ref 0.0–0.1)
Basophils Relative: 0 %
Eosinophils Absolute: 0 10*3/uL (ref 0.0–0.5)
Eosinophils Relative: 1 %
HCT: 32.9 % — ABNORMAL LOW (ref 39.0–52.0)
Hemoglobin: 10.9 g/dL — ABNORMAL LOW (ref 13.0–17.0)
Immature Granulocytes: 0 %
Lymphocytes Relative: 12 %
Lymphs Abs: 0.9 10*3/uL (ref 0.7–4.0)
MCH: 26 pg (ref 26.0–34.0)
MCHC: 33.1 g/dL (ref 30.0–36.0)
MCV: 78.5 fL — ABNORMAL LOW (ref 80.0–100.0)
Monocytes Absolute: 0.7 10*3/uL (ref 0.1–1.0)
Monocytes Relative: 10 %
Neutro Abs: 6.1 10*3/uL (ref 1.7–7.7)
Neutrophils Relative %: 77 %
Platelets: 190 10*3/uL (ref 150–400)
RBC: 4.19 MIL/uL — ABNORMAL LOW (ref 4.22–5.81)
RDW: 16.8 % — ABNORMAL HIGH (ref 11.5–15.5)
WBC: 7.8 10*3/uL (ref 4.0–10.5)
nRBC: 0 % (ref 0.0–0.2)

## 2019-07-23 LAB — BASIC METABOLIC PANEL
Anion gap: 11 (ref 5–15)
BUN: 16 mg/dL (ref 8–23)
CO2: 28 mmol/L (ref 22–32)
Calcium: 8.8 mg/dL — ABNORMAL LOW (ref 8.9–10.3)
Chloride: 96 mmol/L — ABNORMAL LOW (ref 98–111)
Creatinine, Ser: 1.37 mg/dL — ABNORMAL HIGH (ref 0.61–1.24)
GFR calc Af Amer: 55 mL/min — ABNORMAL LOW (ref >60)
GFR calc non Af Amer: 47 mL/min — ABNORMAL LOW (ref >60)
Glucose, Bld: 119 mg/dL — ABNORMAL HIGH (ref 70–99)
Potassium: 3.8 mmol/L (ref 3.5–5.1)
Sodium: 135 mmol/L (ref 135–145)

## 2019-07-23 MED ORDER — BENZONATATE 100 MG PO CAPS
100.0000 mg | ORAL_CAPSULE | Freq: Three times a day (TID) | ORAL | 0 refills | Status: DC | PRN
Start: 2019-07-23 — End: 2020-03-28

## 2019-07-23 MED ORDER — FAMOTIDINE 20 MG PO TABS
20.0000 mg | ORAL_TABLET | Freq: Two times a day (BID) | ORAL | 0 refills | Status: DC
Start: 2019-07-23 — End: 2020-03-28

## 2019-07-23 MED ORDER — ALUM & MAG HYDROXIDE-SIMETH 200-200-20 MG/5ML PO SUSP
30.0000 mL | Freq: Once | ORAL | Status: AC
  Administered 2019-07-23: 30 mL via ORAL
  Filled 2019-07-23: qty 30

## 2019-07-23 MED ORDER — IOHEXOL 300 MG/ML  SOLN
100.0000 mL | Freq: Once | INTRAMUSCULAR | Status: AC | PRN
  Administered 2019-07-23: 75 mL via INTRAVENOUS

## 2019-07-23 MED ORDER — LIDOCAINE VISCOUS HCL 2 % MT SOLN
15.0000 mL | Freq: Once | OROMUCOSAL | Status: AC
  Administered 2019-07-23: 15 mL via ORAL
  Filled 2019-07-23: qty 15

## 2019-07-23 NOTE — ED Triage Notes (Signed)
Difficulty swallowing x 1 week. States it feels like something is stuck in his throat and he is constantly coughing to clear it. States he has been seen and dx with reflux but the medications are not helping.

## 2019-07-23 NOTE — ED Provider Notes (Signed)
[facility] EMERGENCY DEPARTMENT Provider Note   CSN: [id number] Arrival date & time: 07/23/19  1734     History Chief Complaint  Patient presents with  . Dysphagia    Brandon Fischer is a 86 y.o. male with history of CKD, GERD, hypertension, hyperlipidemia, nephrolithiasis, paroxysmal SVT, hard of hearing presents for evaluation of acute onset, persistent dysphagia for 1.5 to 2 weeks.  He is accompanied by his daughter who helps provide supplemental history.  Patient states that he has been having difficulty swallowing for almost 2 weeks.  He contacted his PCP who evaluated him and noted that he could have acid reflux so he encouraged him to increase his pantoprazole from 40 mg once daily to twice daily which he has been doing for at least 1 week with no improvement.  He feels as though there is something stuck in his throat.  Denies drooling.  He feels short of breath when he lays flat.  Denies chest pain, fevers, abdominal pain, nausea, vomiting.  Daughter notes that he is constantly trying to clear his throat.  He has also tried Mylanta, Tums and other over-the-counter medications with no relief.  The history is provided by the patient.       Past Medical History:  Diagnosis Date  . Bilateral carotid artery stenosis without cerebral infarction    ASYMPTOMATIC--  BILATERAL ICA  50-69%  PER CARDIOLOGIST NOTE, DR Einar Gip  . Bilateral carotid bruits    LEFT  soft bruit, right no no carotid bruit per 07-24-2014 dr Einar Gip note  . CKD (chronic kidney disease)    dr foster [place] kidney, ckd stage 3 per lov 02-16-2019  . Complication of anesthesia    1 episode atrial fib 02-09-2014 [facility] and followed up with cardiology, not further issue  . Coronary artery disease    NON-OBSTRUCTIVE CAD  per cath 08-06-2004 HX PALPITATIONS-AND -TACHYCARDIA-  . First degree heart block   . Full dentures   . GERD (gastroesophageal reflux disease)   . Heart palpitations    Not any more  .  History of atrial fibrillation without current medication    EPISODE OF AFIB WITH RVR INTRAOPERATIVELY 02-09-2014 AT [facility]--  RESOLVED AND PT FOLLOWED UP WITH CARDIOLOGIST  DR Einar Gip  . History of kidney stones   . Hyperlipidemia   . Hypertension   . Nephrolithiasis    RIGHT  . Organic impotence   . Psoriasis    Clearing up  . PSVT (paroxysmal supraventricular tachycardia) ([place])   . Right ureteral calculus   . S/P radiofrequency ablation operation for arrhythmia-SVT 04/28/2014  . Simple renal cyst    right  . Wears glasses   . Wears hearing aid    BILATERAL    Patient Active Problem List   Diagnosis Date Noted  . Spinal stenosis at L4-L5 level 07/15/2018  . S/P radiofrequency ablation operation for arrhythmia-SVT 04/28/2014  . SVT (supraventricular tachycardia) ([place]) 03/15/2014  . Nephrolithiasis 01/17/2011  . Hypertension   . Dyslipidemia   . Palpitation     Past Surgical History:  Procedure Laterality Date  . CARDIAC CATHETERIZATION  08-06-2004 dr Verlon Setting   (abnormal stress test) Non-obstructive CAD, pLAD 20-30%,  LCX 30-40%, pRCA 20-30%, dRCA 40%, distal LM  mild diffuse calcifation 20-30% tapering stenosis,  preserved LVSF ef 55-60%  . CARPAL TUNNEL RELEASE Bilateral 2005  . CATARACT EXTRACTION, BILATERAL Bilateral 2011  . CHOLECYSTECTOMY OPEN  1982   and NEPHROLITHOTOMY  . COLONOSCOPY W/ POLYPECTOMY  last one  04-05-2008  . CYSTOSCOPY WITH LITHOLAPAXY Right 04/14/2017   Procedure: CYSTOSCOPY WITH LITHOLAPAXY/RIGHT RETROGRADE PLYOGRAM/RIGHT URETEROSCOPY;  Surgeon: [name], MD;  Location: [facility];  Service: Urology;  Laterality: Right;  . CYSTOSCOPY WITH RETROGRADE PYELOGRAM, URETEROSCOPY AND STENT PLACEMENT Right 03/14/2014   Procedure: CYSTO/RIGHT RETROGRADE PYELOGRAM/RIGHT URETEROSCOPY/STONE EXTRACTION/STENT PLACEMENT;  Surgeon: Malka So, MD;  Location: [facility];  Service: Urology;  Laterality: Right;  . CYSTOSCOPY WITH RETROGRADE PYELOGRAM,  URETEROSCOPY AND STENT PLACEMENT Left 04/19/2019   Procedure: CYSTOSCOPY WITH LEFT  RETROGRADE LEFT , URETEROSCOPY WITH HOLMIUM LASER, BASKET EXTRACTION AND STENT PLACEMENT;  Surgeon: [name], MD;  Location: [facility];  Service: Urology;  Laterality: Left;  . EYE SURGERY Bilateral    Cataracts removed  . HOLMIUM LASER APPLICATION Right 03/21/298   Procedure: RIGHT HOLMIUM LASER APPLICATION;  Surgeon: Malka So, MD;  Location: [facility];  Service: Urology;  Laterality: Right;  . KNEE ARTHROSCOPY W/ DEBRIDEMENT Right 11-23-2002   and Synovectomy  . LUMBAR LAMINECTOMY/DECOMPRESSION MICRODISCECTOMY N/A 07/15/2018   Procedure: Posterior lumbar decompression and fusion L4-5;  Surgeon: Melina Schools, MD;  Location: [facility];  Service: Orthopedics;  Laterality: N/A;  4 hrs  . NEPHROLITHOTOMY  01/16/2011   Procedure: NEPHROLITHOTOMY PERCUTANEOUS;  Surgeon: Malka So;  Location: [facility];  Service: Urology;  Laterality: Left;  C-Arm  Holmium Laser  . NEPHROLITHOTOMY  1981  . SUPRAVENTRICULAR TACHYCARDIA ABLATION N/A 04/28/2014   Procedure: SUPRAVENTRICULAR TACHYCARDIA ABLATION;  Surgeon: Evans Lance, MD;  Location: [facility];  Service: Cardiovascular;  Laterality: N/A;       Family History  Problem Relation Age of Onset  . Cancer Mother        Ovarian or colon  . Heart attack Father   . Heart attack Brother     Social History   Tobacco Use  . Smoking status: Former Smoker    Packs/day: 1.00    Years: 40.00    Pack years: 40.00    Types: Cigarettes    Quit date: 02/06/1998    Years since quitting: 21.4  . Smokeless tobacco: Never Used  Vaping Use  . Vaping Use: Never used  Substance Use Topics  . Alcohol use: No  . Drug use: No    Home Medications Prior to Admission medications   Medication Sig Start Date End Date Taking? Authorizing Provider  amLODipine (NORVASC) 10 MG tablet Take 10 mg by mouth daily.  05/10/18   [provider]    Ascorbic Acid (VITAMIN C) 100 MG tablet Take 100 mg by mouth daily.    [provider]  aspirin EC 81 MG tablet Take 81 mg by mouth every other day. Swallow whole.    [provider]  atorvastatin (LIPITOR) 20 MG tablet Take 20 mg by mouth every evening.     [provider]  b complex vitamins tablet Take 1 tablet by mouth daily.    [provider]  benzonatate (TESSALON) 100 MG capsule Take 1 capsule (100 mg total) by mouth 3 (three) times daily as needed for cough. 07/23/19   Edy Mcbane A, PA-C  buPROPion (WELLBUTRIN XL) 150 MG 24 hr tablet Take 150 mg by mouth daily. 07/11/19   [provider]  diphenhydramine-acetaminophen (TYLENOL PM) 25-500 MG TABS Take 2 tablets by mouth at bedtime as needed (sleep).     [provider]  famotidine (PEPCID) 20 MG tablet Take 1 tablet (20 mg total) by mouth 2 (  two) times daily. 07/23/19   Dejuan Elman A, PA-C  Hyoscyamine Sulfate SL (LEVSIN/SL) 0.125 MG SUBL Place 1 tablet under the tongue every 6 (six) hours as needed (spasms and bladder pain). Patient not taking: Reported on 07/18/2019 04/19/19   [name], MD  oxyCODONE-acetaminophen (PERCOCET) 5-325 MG tablet Take 1 tablet by mouth every 6 (six) hours as needed for severe pain. Patient not taking: Reported on 07/18/2019 04/19/19 04/18/20  [name], MD  pantoprazole (PROTONIX) 40 MG tablet Take 40 mg by mouth 2 (two) times daily.     [provider]  traZODone (DESYREL) 50 MG tablet Take 100 mg by mouth at bedtime. 07/11/19   [provider]  UNABLE TO FIND Med Name: vitamin d 3 1000 units daily    [provider]    Allergies    Patient has no known allergies.  Review of Systems   Review of Systems  Constitutional: Negative for chills and fever.  HENT: Positive for trouble swallowing. Negative for sore throat and voice change.   Respiratory: Positive for shortness of breath.   Cardiovascular: Negative for chest pain.   Musculoskeletal: Negative for neck pain.  All other systems reviewed and are negative.   Physical Exam Updated Vital Signs BP (!) 173/78 (BP Location: Right Arm)   Pulse 83   Temp 98.5 F (36.9 C) (Oral)   Resp 18   Ht 5' 10.5" (1.791 m)   Wt 87.1 kg   SpO2 100%   BMI 27.16 kg/m   Physical Exam Vitals and nursing note reviewed.  Constitutional:      General: He is not in acute distress.    Appearance: He is well-developed.  HENT:     Head: Normocephalic and atraumatic.     Mouth/Throat:     Mouth: Mucous membranes are moist.     Pharynx: No posterior oropharyngeal erythema.     Comments: No uvular edema, tonsillar hypertrophy or exudates.  Tolerating secretions without difficulty.  No abnormal phonation.  Patient is constantly clearing his throat. Eyes:     General:        Right eye: No discharge.        Left eye: No discharge.     Conjunctiva/sclera: Conjunctivae normal.  Neck:     Vascular: No JVD.     Trachea: No tracheal deviation.     Comments: No palpable mass.  No upper airway stridor.  No carotid bruit. Cardiovascular:     Rate and Rhythm: Normal rate and regular rhythm.  Pulmonary:     Effort: Pulmonary effort is normal.     Breath sounds: Normal breath sounds.  Abdominal:     General: Bowel sounds are normal. There is no distension.     Palpations: Abdomen is soft.     Tenderness: There is no abdominal tenderness. There is no guarding or rebound.  Musculoskeletal:     Cervical back: Normal range of motion and neck supple.  Skin:    General: Skin is warm and dry.     Findings: No erythema.  Neurological:     Mental Status: He is alert.  Psychiatric:        Behavior: Behavior normal.     ED Results / Procedures / Treatments   Labs (all labs ordered are listed, but only abnormal results are displayed) Labs Reviewed  BASIC METABOLIC PANEL - Abnormal; Notable for the following components:      Result Value   Chloride 96 (*)    Glucose,  Bld 119  (*)    Creatinine, Ser 1.37 (*)    Calcium 8.8 (*)    GFR calc non Af Amer 47 (*)    GFR calc Af Amer 55 (*)    All other components within normal limits  CBC WITH DIFFERENTIAL/PLATELET - Abnormal; Notable for the following components:   RBC 4.19 (*)    Hemoglobin 10.9 (*)    HCT 32.9 (*)    MCV 78.5 (*)    RDW 16.8 (*)    All other components within normal limits    EKG None  Radiology CT Soft Tissue Neck W Contrast  Result Date: 07/23/2019 CLINICAL DATA:  Dysphagia. Feels like something is stuck in the throat. EXAM: CT NECK WITH CONTRAST TECHNIQUE: Multidetector CT imaging of the neck was performed using the standard protocol following the bolus administration of intravenous contrast. CONTRAST:  60mL OMNIPAQUE IOHEXOL 300 MG/ML  SOLN COMPARISON:  None. FINDINGS: Pharynx and larynx: No mucosal or submucosal lesion. No abnormality seen in the region of the cervical or upper thoracic esophagus by CT. Salivary glands: Parotid and submandibular glands are normal. Thyroid: Normal Lymph nodes: No enlarged or low-density nodes on either side of the neck. Vascular: Ordinary atherosclerotic. No other vascular finding. Calcification at the carotid bifurcations Limited intracranial: Normal Visualized orbits: Normal Mastoids and visualized paranasal sinuses: No significant sinus disease. Mastoids negative. Skeleton: Ordinary cervical spondylosis. Small anterior osteophytes, probably not large enough to cause dysphagia. Upper chest: Normal Other: None IMPRESSION: No abnormality seen to explain dysphagia. No sign of mass lesion by CT or other obstructing process. Electronically Signed   By: Nelson Chimes M.D.   On: 07/23/2019 20:16    Procedures Procedures (including critical care time)  Medications Ordered in ED Medications  alum & mag hydroxide-simeth (MAALOX/MYLANTA) 200-200-20 MG/5ML suspension 30 mL (30 mLs Oral Given 07/23/19 1845)    And  lidocaine (XYLOCAINE) 2 % viscous mouth solution 15 mL  (15 mLs Oral Given 07/23/19 1845)  iohexol (OMNIPAQUE) 300 MG/ML solution 100 mL (75 mLs Intravenous Contrast Given 07/23/19 1959)    ED Course  I have reviewed the triage vital signs and the nursing notes.  Pertinent labs & imaging results that were available during my care of the patient were reviewed by me and considered in my medical decision making (see chart for details).    MDM Rules/Calculators/A&P                          Patient presents for evaluation of 1 to 2 weeks of globus sensation/difficulty swallowing.  He is afebrile, a little hypertensive in the ED.  Vital signs otherwise stable.  He has a normal neurologic examination with no focal deficits, moving all extremities spontaneously without difficulty.  He is able to swallow sips of fluid and soft foods.  No upper airway stridor or evidence of angioedema.  He is not on an ACE or an ARB.  No nuchal rigidity or fever noted.  Lab work reviewed and interpreted by myself shows no leukocytosis, stable anemia, no metabolic derangements.  Renal insufficiency at patient's baseline.  We obtained a CT soft tissue neck to rule out structural abnormality or goiter which showed no evidence of mass or other obstructive process to explain the patient's symptoms.  He was given a GI cocktail in the ED with no relief.  His airway overall appears quite patent.  The lungs are clear and I doubt aspiration pneumonia or pneumonia of  other etiology.  He does have a frequent cough in the ED but appears to be clearing his throat.  We discussed continuing with pantoprazole and adding Pepcid.  They will try Tessalon as well for symptomatic management as he finds the cough bothersome.  Ultimately I think that he needs to follow-up with PCP or GI for additional testing, potential endoscopy.  We discussed transitioning to soft diet and bite-size foods.  Discussed strict ED return precautions.  Patient and daughter verbalized understanding of and agreement with plan and  patient is stable for discharge at this time.  Discussed with Dr. Laverta Baltimore who agrees with assessment and plan at this time.   Final Clinical Impression(s) / ED Diagnoses Final diagnoses:  Dysphagia, unspecified type  Cough    Rx / DC Orders ED Discharge Orders         Ordered    benzonatate (TESSALON) 100 MG capsule  3 times daily PRN     Discontinue  Reprint     07/23/19 2110    famotidine (PEPCID) 20 MG tablet  2 times daily     Discontinue  Reprint     07/23/19 2110           Renita Papa, PA-C 07/24/19 1457    Margette Fast, MD 07/30/19 [phone]

## 2019-07-23 NOTE — ED Notes (Signed)
CT tech notified RN of pt getting a skin tear to R elbow while laying in stretcher. Gauze applied with no bleed through. Pt walking to CT with staff.

## 2019-07-23 NOTE — ED Notes (Signed)
ED Provider at bedside. 

## 2019-07-23 NOTE — Discharge Instructions (Addendum)
Can try tessalon to suppress cough. Can also try over the counter allergy medicines, warm water salt gargles, humidifier.  Start taking Pepcid twice daily with meals in addition to pantoprazole.  Your imaging today was reassuring. I think the next step is to follow up with GI or your PCP for further studies such as a Barium swallow or endoscopy to visualize the structures.   I have attached information regarding dietary modifications that can be helpful.  Please call your PCP and GI to set up follow-up first thing Monday morning.  Return to the emergency department if any concerning signs or symptoms develop such as inability to swallow, fevers, shortness of breath, loss of consciousness.

## 2019-07-25 DIAGNOSIS — D509 Iron deficiency anemia, unspecified: Secondary | ICD-10-CM | POA: Diagnosis not present

## 2019-07-25 DIAGNOSIS — Z130 Encounter for screening for diseases of the blood and blood-forming organs and certain disorders involving the immune mechanism: Secondary | ICD-10-CM | POA: Diagnosis not present

## 2019-07-27 ENCOUNTER — Encounter (HOSPITAL_COMMUNITY): Payer: Self-pay | Admitting: Emergency Medicine

## 2019-07-27 ENCOUNTER — Emergency Department (HOSPITAL_COMMUNITY)
Admission: EM | Admit: 2019-07-27 | Discharge: 2019-07-27 | Disposition: A | Discharge status: Home / Self Care | Payer: Medicare Other | Attending: Emergency Medicine | Admitting: Emergency Medicine

## 2019-07-27 DIAGNOSIS — R531 Weakness: Secondary | ICD-10-CM | POA: Diagnosis not present

## 2019-07-27 DIAGNOSIS — R42 Dizziness and giddiness: Secondary | ICD-10-CM | POA: Diagnosis not present

## 2019-07-27 DIAGNOSIS — I959 Hypotension, unspecified: Secondary | ICD-10-CM | POA: Diagnosis not present

## 2019-07-27 DIAGNOSIS — Z87891 Personal history of nicotine dependence: Secondary | ICD-10-CM | POA: Diagnosis not present

## 2019-07-27 DIAGNOSIS — N189 Chronic kidney disease, unspecified: Secondary | ICD-10-CM | POA: Diagnosis not present

## 2019-07-27 DIAGNOSIS — I2510 Atherosclerotic heart disease of native coronary artery without angina pectoris: Secondary | ICD-10-CM | POA: Insufficient documentation

## 2019-07-27 DIAGNOSIS — I129 Hypertensive chronic kidney disease with stage 1 through stage 4 chronic kidney disease, or unspecified chronic kidney disease: Secondary | ICD-10-CM | POA: Insufficient documentation

## 2019-07-27 DIAGNOSIS — Z79899 Other long term (current) drug therapy: Secondary | ICD-10-CM | POA: Insufficient documentation

## 2019-07-27 DIAGNOSIS — R5381 Other malaise: Secondary | ICD-10-CM

## 2019-07-27 DIAGNOSIS — Z7982 Long term (current) use of aspirin: Secondary | ICD-10-CM | POA: Diagnosis not present

## 2019-07-27 DIAGNOSIS — I440 Atrioventricular block, first degree: Secondary | ICD-10-CM | POA: Diagnosis not present

## 2019-07-27 DIAGNOSIS — I10 Essential (primary) hypertension: Secondary | ICD-10-CM | POA: Diagnosis not present

## 2019-07-27 LAB — URINALYSIS, ROUTINE W REFLEX MICROSCOPIC
Bacteria, UA: NONE SEEN
Bilirubin Urine: NEGATIVE
Glucose, UA: NEGATIVE mg/dL
Hgb urine dipstick: NEGATIVE
Ketones, ur: NEGATIVE mg/dL
Leukocytes,Ua: NEGATIVE
Nitrite: NEGATIVE
Protein, ur: 100 mg/dL — AB
Specific Gravity, Urine: 1.016 (ref 1.005–1.030)
pH: 7 (ref 5.0–8.0)

## 2019-07-27 LAB — CBC
HCT: 34 % — ABNORMAL LOW (ref 39.0–52.0)
Hemoglobin: 10.9 g/dL — ABNORMAL LOW (ref 13.0–17.0)
MCH: 26 pg (ref 26.0–34.0)
MCHC: 32.1 g/dL (ref 30.0–36.0)
MCV: 81.1 fL (ref 80.0–100.0)
Platelets: 234 10*3/uL (ref 150–400)
RBC: 4.19 MIL/uL — ABNORMAL LOW (ref 4.22–5.81)
RDW: 17.1 % — ABNORMAL HIGH (ref 11.5–15.5)
WBC: 6.4 10*3/uL (ref 4.0–10.5)
nRBC: 0 % (ref 0.0–0.2)

## 2019-07-27 LAB — BASIC METABOLIC PANEL
Anion gap: 10 (ref 5–15)
BUN: 15 mg/dL (ref 8–23)
CO2: 26 mmol/L (ref 22–32)
Calcium: 8.7 mg/dL — ABNORMAL LOW (ref 8.9–10.3)
Chloride: 100 mmol/L (ref 98–111)
Creatinine, Ser: 1.37 mg/dL — ABNORMAL HIGH (ref 0.61–1.24)
GFR calc Af Amer: 55 mL/min — ABNORMAL LOW (ref >60)
GFR calc non Af Amer: 47 mL/min — ABNORMAL LOW (ref >60)
Glucose, Bld: 149 mg/dL — ABNORMAL HIGH (ref 70–99)
Potassium: 4.3 mmol/L (ref 3.5–5.1)
Sodium: 136 mmol/L (ref 135–145)

## 2019-07-27 MED ORDER — SODIUM CHLORIDE 0.9% FLUSH: 3.0000 mL | Freq: Once | INTRAVENOUS | Status: DC

## 2019-07-27 NOTE — Discharge Instructions (Addendum)
The testing today did not show any acute problems that require intervention.  Continue the current recommended treatment.  It is probably not necessary to to use the Pepcid that was previously prescribed.  Call the GI doctor to reschedule the appointment to be evaluated for trouble swallowing.  See your primary care doctor as needed for further management and treatment.

## 2019-07-27 NOTE — ED Provider Notes (Signed)
[facility] EMERGENCY DEPARTMENT Provider Note   CSN: [id number] Arrival date & time: 07/27/19  1043     History Chief Complaint  Patient presents with  . Weakness    Brandon Fischer is a 86 y.o. male.  HPI Patient presents by EMS for evaluation of weakness which he noticed this morning.  Is able to discuss things with his daughter, however.  Later in the visit, at 6:05 PM.  At this time she reports the patient has been doing better, she is encouraged him to eat and drink and she is staying with him some.  Unfortunately he was due to see GI today to be evaluated for dysphagia, but ended up in the ED, so he missed the appointment.  There is been no vomiting, fever, cough, shortness of breath, focal weakness or reported numbness.  He is able to ambulate normally.  There are no other known modifying factors.    Past Medical History:  Diagnosis Date  . Bilateral carotid artery stenosis without cerebral infarction    ASYMPTOMATIC--  BILATERAL ICA  50-69%  PER CARDIOLOGIST NOTE, DR Einar Gip  . Bilateral carotid bruits    LEFT  soft bruit, right no no carotid bruit per 07-24-2014 dr Einar Gip note  . CKD (chronic kidney disease)    dr foster [place] kidney, ckd stage 3 per lov 02-16-2019  . Complication of anesthesia    1 episode atrial fib 02-09-2014 [facility] and followed up with cardiology, not further issue  . Coronary artery disease    NON-OBSTRUCTIVE CAD  per cath 08-06-2004 HX PALPITATIONS-AND -TACHYCARDIA-  . First degree heart block   . Full dentures   . GERD (gastroesophageal reflux disease)   . Heart palpitations    Not any more  . History of atrial fibrillation without current medication    EPISODE OF AFIB WITH RVR INTRAOPERATIVELY 02-09-2014 AT [facility]--  RESOLVED AND PT FOLLOWED UP WITH CARDIOLOGIST  DR Einar Gip  . History of kidney stones   . Hyperlipidemia   . Hypertension   . Nephrolithiasis    RIGHT  . Organic impotence   . Psoriasis    Clearing up  . PSVT  (paroxysmal supraventricular tachycardia) ([place])   . Right ureteral calculus   . S/P radiofrequency ablation operation for arrhythmia-SVT 04/28/2014  . Simple renal cyst    right  . Wears glasses   . Wears hearing aid    BILATERAL    Patient Active Problem List   Diagnosis Date Noted  . Spinal stenosis at L4-L5 level 07/15/2018  . S/P radiofrequency ablation operation for arrhythmia-SVT 04/28/2014  . SVT (supraventricular tachycardia) ([place]) 03/15/2014  . Nephrolithiasis 01/17/2011  . Hypertension   . Dyslipidemia   . Palpitation     Past Surgical History:  Procedure Laterality Date  . CARDIAC CATHETERIZATION  08-06-2004 dr Verlon Setting   (abnormal stress test) Non-obstructive CAD, pLAD 20-30%,  LCX 30-40%, pRCA 20-30%, dRCA 40%, distal LM  mild diffuse calcifation 20-30% tapering stenosis,  preserved LVSF ef 55-60%  . CARPAL TUNNEL RELEASE Bilateral 2005  . CATARACT EXTRACTION, BILATERAL Bilateral 2011  . CHOLECYSTECTOMY OPEN  1982   and NEPHROLITHOTOMY  . COLONOSCOPY W/ POLYPECTOMY  last one 04-05-2008  . CYSTOSCOPY WITH LITHOLAPAXY Right 04/14/2017   Procedure: CYSTOSCOPY WITH LITHOLAPAXY/RIGHT RETROGRADE PLYOGRAM/RIGHT URETEROSCOPY;  Surgeon: [name], MD;  Location[facility];  Service: Urology;  Laterality: Right;  . CYSTOSCOPY WITH RETROGRADE PYELOGRAM, URETEROSCOPY AND STENT PLACEMENT Right 03/14/2014   Procedure: CYSTO/RIGHT RETROGRADE PYELOGRAM/RIGHT URETEROSCOPY/STONE  EXTRACTION/STENT PLACEMENT;  Surgeon: Malka So, MD;  Location: [facility];  Service: Urology;  Laterality: Right;  . CYSTOSCOPY WITH RETROGRADE PYELOGRAM, URETEROSCOPY AND STENT PLACEMENT Left 04/19/2019   Procedure: CYSTOSCOPY WITH LEFT  RETROGRADE LEFT , URETEROSCOPY WITH HOLMIUM LASER, BASKET EXTRACTION AND STENT PLACEMENT;  Surgeon: [name], MD;  [facility]: [facility];  Service: Urology;  Laterality: Left;  . EYE SURGERY Bilateral    Cataracts removed  . HOLMIUM LASER  APPLICATION Right 01/22/3788   Procedure: RIGHT HOLMIUM LASER APPLICATION;  Surgeon: Malka So, MD;  Location: [facility];  Service: Urology;  Laterality: Right;  . KNEE ARTHROSCOPY W/ DEBRIDEMENT Right 11-23-2002   and Synovectomy  . LUMBAR LAMINECTOMY/DECOMPRESSION MICRODISCECTOMY N/A 07/15/2018   Procedure: Posterior lumbar decompression and fusion L4-5;  Surgeon: Melina Schools, MD;  Location: [facility];  Service: Orthopedics;  Laterality: N/A;  4 hrs  . NEPHROLITHOTOMY  01/16/2011   Procedure: NEPHROLITHOTOMY PERCUTANEOUS;  Surgeon: Malka So;  [facility];  Service: Urology;  Laterality: Left;  C-Arm  Holmium Laser  . NEPHROLITHOTOMY  1981  . SUPRAVENTRICULAR TACHYCARDIA ABLATION N/A 04/28/2014   Procedure: SUPRAVENTRICULAR TACHYCARDIA ABLATION;  Surgeon: Evans Lance, MD;  Location: [facility] CATH LAB;  Service: Cardiovascular;  Laterality: N/A;       Family History  Problem Relation Age of Onset  . Cancer Mother        Ovarian or colon  . Heart attack Father   . Heart attack Brother     Social History   Tobacco Use  . Smoking status: Former Smoker    Packs/day: 1.00    Years: 40.00    Pack years: 40.00    Types: Cigarettes    Quit date: 02/06/1998    Years since quitting: 21.4  . Smokeless tobacco: Never Used  Vaping Use  . Vaping Use: Never used  Substance Use Topics  . Alcohol use: No  . Drug use: No    Home Medications Prior to Admission medications   Medication Sig Start Date End Date Taking? Authorizing Provider  amLODipine (NORVASC) 10 MG tablet Take 10 mg by mouth daily.  05/10/18  Yes [provider]  Ascorbic Acid (VITAMIN C) 100 MG tablet Take 100 mg by mouth daily.   Yes [provider]  aspirin EC 81 MG tablet Take 81 mg by mouth every other day. Swallow whole.   Yes [provider]  atorvastatin (LIPITOR) 20 MG tablet Take 20 mg by mouth daily.    Yes [provider]  b complex vitamins tablet Take 1  tablet by mouth daily.   Yes [provider]  Cholecalciferol (VITAMIN D-3) 25 MCG (1000 UT) CAPS Take 1,000 Units by mouth daily with breakfast.   Yes [provider]  pantoprazole (PROTONIX) 40 MG tablet Take 40 mg by mouth in the morning and at bedtime.    Yes [provider]  zinc gluconate 50 MG tablet Take 50 mg by mouth daily.   Yes [provider]  benzonatate (TESSALON) 100 MG capsule Take 1 capsule (100 mg total) by mouth 3 (three) times daily as needed for cough. Patient not taking: Reported on 07/27/2019 07/23/19   Rodell Perna A, PA-C  buPROPion (WELLBUTRIN XL) 150 MG 24 hr tablet Take 150 mg by mouth daily. 07/11/19   [provider]  diphenhydramine-acetaminophen (TYLENOL PM) 25-500 MG TABS Take 2 tablets by mouth at bedtime as needed (sleep).  Patient not taking:  Reported on 07/27/2019    [provider]  famotidine (PEPCID) 20 MG tablet Take 1 tablet (20 mg total) by mouth 2 (two) times daily. Patient not taking: Reported on 07/27/2019 07/23/19   Rodell Perna A, PA-C  Hyoscyamine Sulfate SL (LEVSIN/SL) 0.125 MG SUBL Place 1 tablet under the tongue every 6 (six) hours as needed (spasms and bladder pain). Patient not taking: Reported on 07/27/2019 04/19/19   [name], MD  oxyCODONE-acetaminophen (PERCOCET) 5-325 MG tablet Take 1 tablet by mouth every 6 (six) hours as needed for severe pain. Patient not taking: Reported on 07/27/2019 04/19/19 04/18/20  [name], MD  traZODone (DESYREL) 50 MG tablet Take 100 mg by mouth at bedtime. Patient not taking: Reported on 07/27/2019 07/11/19   [provider]    Allergies    Patient has no known allergies.  Review of Systems   Review of Systems  All other systems reviewed and are negative.   Physical Exam Updated Vital Signs BP (!) 166/65   Pulse 82   Temp 98.5 F (36.9 C) (Oral)   Resp (!) 24   Ht 5' 10.5" (1.791 m)   Wt 87.1 kg   SpO2 100%   BMI 27.16 kg/m   Physical  Exam Vitals and nursing note reviewed.  Constitutional:      General: He is not in acute distress.    Appearance: He is well-developed. He is not ill-appearing, toxic-appearing or diaphoretic.  HENT:     Head: Normocephalic and atraumatic.     Right Ear: External ear normal.     Left Ear: External ear normal.  Eyes:     Conjunctiva/sclera: Conjunctivae normal.     Pupils: Pupils are equal, round, and reactive to light.  Neck:     Trachea: Phonation normal.  Cardiovascular:     Rate and Rhythm: Normal rate and regular rhythm.     Heart sounds: Normal heart sounds.  Pulmonary:     Effort: Pulmonary effort is normal.     Breath sounds: Normal breath sounds.  Abdominal:     General: There is no distension.     Palpations: Abdomen is soft.     Tenderness: There is no abdominal tenderness.  Musculoskeletal:        General: Normal range of motion.     Cervical back: Normal range of motion and neck supple.  Skin:    General: Skin is warm and dry.  Neurological:     Mental Status: He is alert and oriented to person, place, and time.     Cranial Nerves: No cranial nerve deficit.     Sensory: No sensory deficit.     Motor: No abnormal muscle tone.     Coordination: Coordination normal.  Psychiatric:        Mood and Affect: Mood normal.        Behavior: Behavior normal.     ED Results / Procedures / Treatments   Labs (all labs ordered are listed, but only abnormal results are displayed) Labs Reviewed  BASIC METABOLIC PANEL - Abnormal; Notable for the following components:      Result Value   Glucose, Bld 149 (*)    Creatinine, Ser 1.37 (*)    Calcium 8.7 (*)    GFR calc non Af Amer 47 (*)    GFR calc Af Amer 55 (*)    All other components within normal limits  CBC - Abnormal; Notable for the following components:   RBC 4.19 (*)  Hemoglobin 10.9 (*)    HCT 34.0 (*)    RDW 17.1 (*)    All other components within normal limits  URINALYSIS, ROUTINE W REFLEX MICROSCOPIC -  Abnormal; Notable for the following components:   Color, Urine AMBER (*)    APPearance HAZY (*)    Protein, ur 100 (*)    All other components within normal limits  CBG MONITORING, ED    EKG None  Radiology No results found.  Procedures Procedures (including critical care time)  Medications Ordered in ED Medications  sodium chloride flush (NS) 0.9 % injection 3 mL (has no administration in time range)    ED Course  I have reviewed the triage vital signs and the nursing notes.  Pertinent labs & imaging results that were available during my care of the patient were reviewed by me and considered in my medical decision making (see chart for details).  Clinical Course as of Jul 27 1814  Wed Jul 27, 2019  1636 Normal except small amount of protein in few white cells present.  Urinalysis, Routine w reflex microscopic(!) [EW]  1636 Normal except glucose high, creatinine high, calcium low, GFR low  Basic metabolic panel(!) [EW]  9528 Normal except hemoglobin low  CBC(!) [EW]    Clinical Course User Index [EW] Daleen Bo, MD   MDM Rules/Calculators/A&P                           Patient Vitals for the past 24 hrs:  BP Temp Temp src Pulse Resp SpO2 Height Weight  07/27/19 1645 (!) 166/65 -- -- 82 (!) 24 100 % -- --  07/27/19 1630 (!) 162/66 -- -- 77 (!) 21 100 % -- --  07/27/19 1601 (!) 173/69 98.5 F (36.9 C) Oral 78 (!) 21 100 % -- --  07/27/19 1513 (!) 145/75 -- -- 75 15 99 % -- --  07/27/19 1320 (!) 147/61 -- -- 74 16 98 % -- --  07/27/19 1155 (!) 145/71 98.5 F (36.9 C) Oral 73 18 100 % -- --  07/27/19 1051 (!) 149/79 98.9 F (37.2 C) Oral 68 14 99 % 5' 10.5" (1.791 m) 87.1 kg    6:13 PM Reevaluation with update and discussion. After initial assessment and treatment, an updated evaluation reveals he is calm comfortable with reassuring vital signs, he is stable for discharge.  Findings discussed with patient and daughter, at the bedside.  Patient is hungry.  All  questions answered. Daleen Bo   Medical Decision Making:  This patient is presenting for evaluation of nonspecific weakness, which does require a range of treatment options, and is a complaint that involves a moderate risk of morbidity and mortality. The differential diagnoses include acute illness, cardiovascular instability, nutritional deficiency. I decided to review old records, and in summary patient with 3 visits to the ED this month with nonspecific symptoms, and some trouble swallowing.  He missed GI appointment today.  I obtained additional historical information from his daughter at the bedside.  Clinical Laboratory Tests Ordered, included CBC, Metabolic panel and Urinalysis. Review indicates reassuringly normal/unchanged labs from recent baseline..   Critical Interventions-clinical evaluation, laboratory testing, observation, reassessment with daughter at bedside.  After These Interventions, the Patient was reevaluated and was found stable for discharge.  Ongoing symptoms related to recent death of wife in 07/02/19, likely related to malaise and decreased oral intake.  No indication for hospitalization at this time.  Ulcers bacterial infection,  metabolic instability or impending vascular collapse.  CRITICAL CARE-no Performed by: Daleen Bo  Nursing Notes Reviewed/ Care Coordinated Applicable Imaging Reviewed Interpretation of Laboratory Data incorporated into ED treatment  The patient appears reasonably screened and/or stabilized for discharge and I doubt any other medical condition or other [facility] requiring further screening, evaluation, or treatment in the ED at this time prior to discharge.  Plan: Home Medications-continue usual; Home Treatments-rest, fluids; return here if the recommended treatment, does not improve the symptoms; Recommended follow up-PCP and GI as previously recommended.     Final Clinical Impression(s) / ED Diagnoses Final diagnoses:  Weakness   Malaise    Rx / DC Orders ED Discharge Orders    None       Daleen Bo, MD 07/27/19 1816

## 2019-07-27 NOTE — ED Triage Notes (Signed)
Pt arrives via [facility] from home, pt c/o generalized weakness and dizziness for the past few weeks, states wife passed away in June 21, 2022, pt is not eating and drinking regularly since then. Pt placed on buproprion and trazadone a few weeks ago, both meds were stopped over the past few days. Pt orthostatic with EMS. Received 351ml NS. EMS 12 lead shows possible 1st degree block. CBG 167. VS 100% on ra, BP 104/59, HR 102. A/ox4. 22g IV L hand. Speech clear, face symmetrical, moves all limbs equally.

## 2019-08-03 DIAGNOSIS — K219 Gastro-esophageal reflux disease without esophagitis: Secondary | ICD-10-CM | POA: Diagnosis not present

## 2019-08-03 DIAGNOSIS — R1310 Dysphagia, unspecified: Secondary | ICD-10-CM | POA: Diagnosis not present

## 2019-08-03 DIAGNOSIS — R634 Abnormal weight loss: Secondary | ICD-10-CM | POA: Diagnosis not present

## 2019-08-22 DIAGNOSIS — I10 Essential (primary) hypertension: Secondary | ICD-10-CM | POA: Diagnosis not present

## 2019-08-22 DIAGNOSIS — N189 Chronic kidney disease, unspecified: Secondary | ICD-10-CM | POA: Diagnosis not present

## 2019-08-22 DIAGNOSIS — N401 Enlarged prostate with lower urinary tract symptoms: Secondary | ICD-10-CM | POA: Diagnosis not present

## 2019-08-22 DIAGNOSIS — K219 Gastro-esophageal reflux disease without esophagitis: Secondary | ICD-10-CM | POA: Diagnosis not present

## 2019-08-25 DIAGNOSIS — R1310 Dysphagia, unspecified: Secondary | ICD-10-CM | POA: Diagnosis not present

## 2019-08-25 DIAGNOSIS — K228 Other specified diseases of esophagus: Secondary | ICD-10-CM | POA: Diagnosis not present

## 2019-08-25 DIAGNOSIS — K219 Gastro-esophageal reflux disease without esophagitis: Secondary | ICD-10-CM | POA: Diagnosis not present

## 2019-08-30 DIAGNOSIS — R809 Proteinuria, unspecified: Secondary | ICD-10-CM | POA: Diagnosis not present

## 2019-08-30 DIAGNOSIS — D631 Anemia in chronic kidney disease: Secondary | ICD-10-CM | POA: Diagnosis not present

## 2019-08-30 DIAGNOSIS — N1831 Chronic kidney disease, stage 3a: Secondary | ICD-10-CM | POA: Diagnosis not present

## 2019-08-30 DIAGNOSIS — I129 Hypertensive chronic kidney disease with stage 1 through stage 4 chronic kidney disease, or unspecified chronic kidney disease: Secondary | ICD-10-CM | POA: Diagnosis not present

## 2019-08-30 DIAGNOSIS — N200 Calculus of kidney: Secondary | ICD-10-CM | POA: Diagnosis not present

## 2019-08-30 DIAGNOSIS — I2510 Atherosclerotic heart disease of native coronary artery without angina pectoris: Secondary | ICD-10-CM | POA: Diagnosis not present

## 2019-09-20 DIAGNOSIS — I10 Essential (primary) hypertension: Secondary | ICD-10-CM | POA: Diagnosis not present

## 2019-09-20 DIAGNOSIS — F5104 Psychophysiologic insomnia: Secondary | ICD-10-CM | POA: Diagnosis not present

## 2019-09-21 DIAGNOSIS — K219 Gastro-esophageal reflux disease without esophagitis: Secondary | ICD-10-CM | POA: Diagnosis not present

## 2019-09-21 DIAGNOSIS — R1310 Dysphagia, unspecified: Secondary | ICD-10-CM | POA: Diagnosis not present

## 2019-09-29 DIAGNOSIS — D485 Neoplasm of uncertain behavior of skin: Secondary | ICD-10-CM | POA: Diagnosis not present

## 2019-09-29 DIAGNOSIS — R21 Rash and other nonspecific skin eruption: Secondary | ICD-10-CM | POA: Diagnosis not present

## 2019-09-29 DIAGNOSIS — L989 Disorder of the skin and subcutaneous tissue, unspecified: Secondary | ICD-10-CM | POA: Diagnosis not present

## 2019-10-06 DIAGNOSIS — L111 Transient acantholytic dermatosis [Grover]: Secondary | ICD-10-CM | POA: Diagnosis not present

## 2019-10-25 DIAGNOSIS — I10 Essential (primary) hypertension: Secondary | ICD-10-CM | POA: Diagnosis not present

## 2019-10-25 DIAGNOSIS — Z23 Encounter for immunization: Secondary | ICD-10-CM | POA: Diagnosis not present

## 2019-10-25 DIAGNOSIS — F5104 Psychophysiologic insomnia: Secondary | ICD-10-CM | POA: Diagnosis not present

## 2019-12-27 DIAGNOSIS — E7800 Pure hypercholesterolemia, unspecified: Secondary | ICD-10-CM | POA: Diagnosis not present

## 2019-12-27 DIAGNOSIS — I10 Essential (primary) hypertension: Secondary | ICD-10-CM | POA: Diagnosis not present

## 2020-03-15 DIAGNOSIS — Z85828 Personal history of other malignant neoplasm of skin: Secondary | ICD-10-CM | POA: Diagnosis not present

## 2020-03-15 DIAGNOSIS — D1801 Hemangioma of skin and subcutaneous tissue: Secondary | ICD-10-CM | POA: Diagnosis not present

## 2020-03-15 DIAGNOSIS — D225 Melanocytic nevi of trunk: Secondary | ICD-10-CM | POA: Diagnosis not present

## 2020-03-15 DIAGNOSIS — L728 Other follicular cysts of the skin and subcutaneous tissue: Secondary | ICD-10-CM | POA: Diagnosis not present

## 2020-03-15 DIAGNOSIS — Z85820 Personal history of malignant melanoma of skin: Secondary | ICD-10-CM | POA: Diagnosis not present

## 2020-03-15 DIAGNOSIS — L821 Other seborrheic keratosis: Secondary | ICD-10-CM | POA: Diagnosis not present

## 2020-03-15 DIAGNOSIS — L570 Actinic keratosis: Secondary | ICD-10-CM | POA: Diagnosis not present

## 2020-03-15 DIAGNOSIS — L905 Scar conditions and fibrosis of skin: Secondary | ICD-10-CM | POA: Diagnosis not present

## 2020-03-27 DIAGNOSIS — N202 Calculus of kidney with calculus of ureter: Secondary | ICD-10-CM | POA: Diagnosis not present

## 2020-03-27 DIAGNOSIS — N134 Hydroureter: Secondary | ICD-10-CM | POA: Diagnosis not present

## 2020-03-27 DIAGNOSIS — R1084 Generalized abdominal pain: Secondary | ICD-10-CM | POA: Diagnosis not present

## 2020-03-27 DIAGNOSIS — M47816 Spondylosis without myelopathy or radiculopathy, lumbar region: Secondary | ICD-10-CM | POA: Diagnosis not present

## 2020-03-27 DIAGNOSIS — N132 Hydronephrosis with renal and ureteral calculous obstruction: Secondary | ICD-10-CM | POA: Diagnosis not present

## 2020-03-27 DIAGNOSIS — M47814 Spondylosis without myelopathy or radiculopathy, thoracic region: Secondary | ICD-10-CM | POA: Diagnosis not present

## 2020-03-28 ENCOUNTER — Other Ambulatory Visit: Payer: Self-pay | Admitting: Urology

## 2020-03-28 ENCOUNTER — Other Ambulatory Visit: Payer: Self-pay

## 2020-03-28 ENCOUNTER — Encounter (HOSPITAL_BASED_OUTPATIENT_CLINIC_OR_DEPARTMENT_OTHER): Payer: Self-pay | Admitting: Urology

## 2020-03-28 NOTE — Progress Notes (Addendum)
Spoke w/ via phone for pre-op interview---patient daughter Brandon Fischer per pt request cell [phone] Lab needs dos----    I stat            Lab results------[facility] 08-06-2019 epic COVID test ------03-29-2020 1350 pm Arrive at -------1215 pm NPO after MN NO Solid Food.  Clear liquids from MN until---1115 am then npo Med rec completed Medications to take morning of surgery -----amlodipine, pantaprazole, atorvastatin, tramadol prn Diabetic medication -----n/a Patient instructed to bring photo id and insurance card day of surgery Patient aware to have Driver (ride ) / caregiver daughter Brandon Fischer will stay cell [phone]    for 24 hours after surgery  Patient Special Instructions -----none Pre-Op special Istructions -----none  Daughter of Patient verbalized understanding of instructions that were given at this phone interview. Patient denies shortness of breath, chest pain, fever, cough at this phone interview.  lov dr Cecille Rubin [facility] 08-30-2019 on chart   Daughter Brandon Fischer need to go to pre op to help with meds etc

## 2020-03-29 ENCOUNTER — Other Ambulatory Visit (HOSPITAL_COMMUNITY)
Admission: RE | Admit: 2020-03-29 | Discharge: 2020-03-29 | Disposition: A | Discharge status: Home / Self Care | Payer: Medicare Other | Source: Ambulatory Visit | Attending: Urology | Admitting: Urology

## 2020-03-29 DIAGNOSIS — Z01812 Encounter for preprocedural laboratory examination: Secondary | ICD-10-CM | POA: Insufficient documentation

## 2020-03-29 DIAGNOSIS — Z20822 Contact with and (suspected) exposure to covid-19: Secondary | ICD-10-CM | POA: Diagnosis not present

## 2020-03-29 LAB — SARS CORONAVIRUS 2 (TAT 6-24 HRS): SARS Coronavirus 2: NEGATIVE

## 2020-03-30 ENCOUNTER — Other Ambulatory Visit: Payer: Self-pay

## 2020-03-30 ENCOUNTER — Encounter (HOSPITAL_BASED_OUTPATIENT_CLINIC_OR_DEPARTMENT_OTHER)
Admission: RE | Disposition: A | Discharge status: Home / Self Care | Payer: Self-pay | Source: Home / Self Care | Attending: Urology

## 2020-03-30 ENCOUNTER — Encounter (HOSPITAL_BASED_OUTPATIENT_CLINIC_OR_DEPARTMENT_OTHER): Payer: Self-pay | Admitting: Urology

## 2020-03-30 ENCOUNTER — Ambulatory Visit (HOSPITAL_BASED_OUTPATIENT_CLINIC_OR_DEPARTMENT_OTHER): Payer: Medicare Other | Admitting: Anesthesiology

## 2020-03-30 ENCOUNTER — Ambulatory Visit (HOSPITAL_BASED_OUTPATIENT_CLINIC_OR_DEPARTMENT_OTHER)
Admission: RE | Admit: 2020-03-30 | Discharge: 2020-03-30 | Disposition: A | Discharge status: Home / Self Care | Payer: Medicare Other | Attending: Urology | Admitting: Urology

## 2020-03-30 DIAGNOSIS — N183 Chronic kidney disease, stage 3 unspecified: Secondary | ICD-10-CM | POA: Insufficient documentation

## 2020-03-30 DIAGNOSIS — Z87891 Personal history of nicotine dependence: Secondary | ICD-10-CM | POA: Diagnosis not present

## 2020-03-30 DIAGNOSIS — I129 Hypertensive chronic kidney disease with stage 1 through stage 4 chronic kidney disease, or unspecified chronic kidney disease: Secondary | ICD-10-CM | POA: Insufficient documentation

## 2020-03-30 DIAGNOSIS — N201 Calculus of ureter: Secondary | ICD-10-CM | POA: Insufficient documentation

## 2020-03-30 DIAGNOSIS — Z8041 Family history of malignant neoplasm of ovary: Secondary | ICD-10-CM | POA: Diagnosis not present

## 2020-03-30 DIAGNOSIS — N210 Calculus in bladder: Secondary | ICD-10-CM | POA: Diagnosis not present

## 2020-03-30 DIAGNOSIS — Z87442 Personal history of urinary calculi: Secondary | ICD-10-CM | POA: Diagnosis not present

## 2020-03-30 DIAGNOSIS — Z8249 Family history of ischemic heart disease and other diseases of the circulatory system: Secondary | ICD-10-CM | POA: Insufficient documentation

## 2020-03-30 DIAGNOSIS — I471 Supraventricular tachycardia: Secondary | ICD-10-CM | POA: Diagnosis not present

## 2020-03-30 DIAGNOSIS — E785 Hyperlipidemia, unspecified: Secondary | ICD-10-CM | POA: Diagnosis not present

## 2020-03-30 HISTORY — PX: CYSTOSCOPY WITH RETROGRADE PYELOGRAM, URETEROSCOPY AND STENT PLACEMENT: SHX5789

## 2020-03-30 LAB — POCT I-STAT, CHEM 8
BUN: 23 mg/dL (ref 8–23)
Calcium, Ion: 1.26 mmol/L (ref 1.15–1.40)
Chloride: 103 mmol/L (ref 98–111)
Creatinine, Ser: 1.6 mg/dL — ABNORMAL HIGH (ref 0.61–1.24)
Glucose, Bld: 117 mg/dL — ABNORMAL HIGH (ref 70–99)
HCT: 32 % — ABNORMAL LOW (ref 39.0–52.0)
Hemoglobin: 10.9 g/dL — ABNORMAL LOW (ref 13.0–17.0)
Potassium: 3.7 mmol/L (ref 3.5–5.1)
Sodium: 142 mmol/L (ref 135–145)
TCO2: 26 mmol/L (ref 22–32)

## 2020-03-30 SURGERY — CYSTOURETEROSCOPY, WITH RETROGRADE PYELOGRAM AND STENT INSERTION
Anesthesia: General | Site: Renal | Laterality: Left

## 2020-03-30 MED ORDER — PROPOFOL 10 MG/ML IV BOLUS
INTRAVENOUS | Status: AC
  Filled 2020-03-30: qty 20

## 2020-03-30 MED ORDER — LIDOCAINE HCL (CARDIAC) PF 100 MG/5ML IV SOSY
PREFILLED_SYRINGE | INTRAVENOUS | Status: DC | PRN
  Administered 2020-03-30: 50 mg via INTRAVENOUS

## 2020-03-30 MED ORDER — ONDANSETRON HCL 4 MG/2ML IJ SOLN
INTRAMUSCULAR | Status: DC | PRN
  Administered 2020-03-30: 4 mg via INTRAVENOUS

## 2020-03-30 MED ORDER — TRAMADOL HCL 50 MG PO TABS: 50.0000 mg | ORAL_TABLET | Freq: Four times a day (QID) | ORAL | 0 refills | Status: DC | PRN

## 2020-03-30 MED ORDER — EPHEDRINE 5 MG/ML INJ
INTRAVENOUS | Status: AC
  Filled 2020-03-30: qty 10

## 2020-03-30 MED ORDER — DEXAMETHASONE SODIUM PHOSPHATE 4 MG/ML IJ SOLN
INTRAMUSCULAR | Status: DC | PRN
  Administered 2020-03-30: 8 mg via INTRAVENOUS

## 2020-03-30 MED ORDER — PROPOFOL 10 MG/ML IV BOLUS
INTRAVENOUS | Status: DC | PRN
  Administered 2020-03-30: 150 mg via INTRAVENOUS

## 2020-03-30 MED ORDER — FENTANYL CITRATE (PF) 100 MCG/2ML IJ SOLN
INTRAMUSCULAR | Status: DC | PRN
  Administered 2020-03-30 (×2): 25 ug via INTRAVENOUS

## 2020-03-30 MED ORDER — IOHEXOL 300 MG/ML  SOLN
INTRAMUSCULAR | Status: DC | PRN
  Administered 2020-03-30: 10 mL

## 2020-03-30 MED ORDER — FENTANYL CITRATE (PF) 100 MCG/2ML IJ SOLN
INTRAMUSCULAR | Status: AC
  Filled 2020-03-30: qty 2

## 2020-03-30 MED ORDER — CEPHALEXIN 500 MG PO CAPS: 500.0000 mg | ORAL_CAPSULE | Freq: Two times a day (BID) | ORAL | 0 refills | Status: AC

## 2020-03-30 MED ORDER — GENTAMICIN SULFATE 40 MG/ML IJ SOLN
5.0000 mg/kg | INTRAVENOUS | Status: AC
  Administered 2020-03-30: 420 mg via INTRAVENOUS
  Filled 2020-03-30 (×3): qty 10.5

## 2020-03-30 MED ORDER — SODIUM CHLORIDE 0.9 % IR SOLN
Status: DC | PRN
  Administered 2020-03-30: 3000 mL

## 2020-03-30 MED ORDER — ONDANSETRON HCL 4 MG/2ML IJ SOLN
INTRAMUSCULAR | Status: AC
  Filled 2020-03-30: qty 2

## 2020-03-30 MED ORDER — EPHEDRINE SULFATE 50 MG/ML IJ SOLN
INTRAMUSCULAR | Status: DC | PRN
  Administered 2020-03-30: 15 mg via INTRAVENOUS
  Administered 2020-03-30 (×2): 10 mg via INTRAVENOUS

## 2020-03-30 MED ORDER — SODIUM CHLORIDE 0.9 % IV SOLN: INTRAVENOUS | Status: DC

## 2020-03-30 SURGICAL SUPPLY — 27 items
BAG DRAIN URO-CYSTO SKYTR STRL (DRAIN) ×3 IMPLANT
BAG DRN UROCATH (DRAIN) ×2
BASKET LASER NITINOL 1.9FR (BASKET) ×2 IMPLANT
BSKT STON RTRVL 120 1.9FR (BASKET) ×2
CATH INTERMIT  6FR 70CM (CATHETERS) ×2 IMPLANT
CLOTH BEACON ORANGE TIMEOUT ST (SAFETY) ×3 IMPLANT
FIBER LASER FLEXIVA 365 (UROLOGICAL SUPPLIES) IMPLANT
GLOVE SURG ENC MOIS LTX SZ7.5 (GLOVE) ×3 IMPLANT
GOWN STRL REUS W/ TWL LRG LVL3 (GOWN DISPOSABLE) ×2 IMPLANT
GOWN STRL REUS W/TWL LRG LVL3 (GOWN DISPOSABLE) ×9 IMPLANT
GUIDEWIRE ANG ZIPWIRE 038X150 (WIRE) ×3 IMPLANT
GUIDEWIRE STR DUAL SENSOR (WIRE) ×3 IMPLANT
IV NS 1000ML (IV SOLUTION)
IV NS 1000ML BAXH (IV SOLUTION) ×1 IMPLANT
IV NS IRRIG 3000ML ARTHROMATIC (IV SOLUTION) ×3 IMPLANT
KIT TURNOVER CYSTO (KITS) ×3 IMPLANT
MANIFOLD NEPTUNE II (INSTRUMENTS) ×3 IMPLANT
NS IRRIG 500ML POUR BTL (IV SOLUTION) ×3 IMPLANT
PACK CYSTO (CUSTOM PROCEDURE TRAY) ×3 IMPLANT
SHEATH URET ACCESS 12FR/35CM (UROLOGICAL SUPPLIES) ×2 IMPLANT
STENT POLARIS 5FRX26 (STENTS) ×2 IMPLANT
SYR 10ML LL (SYRINGE) ×3 IMPLANT
TRACTIP FLEXIVA PULS ID 200XHI (Laser) IMPLANT
TRACTIP FLEXIVA PULSE ID 200 (Laser)
TUBE CONNECTING 12X1/4 (SUCTIONS) ×3 IMPLANT
TUBE FEEDING 8FR 16IN STR KANG (MISCELLANEOUS) IMPLANT
TUBING UROLOGY SET (TUBING) ×3 IMPLANT

## 2020-03-30 NOTE — H&P (Signed)
Brandon Fischer is an 87 y.o. male.    Chief Complaint: Pre-OP LEFT Ureteroscopic Stone Manipulation  HPI:   1 -LEFT Ureteral STones - cluster of left distal stones by CT 03/2020 on eval flank pain, long h/o recurrent stones. No fevers. Most recnet UCX negative.  Today "Brandon Fischer" is seen to proceed with LEFT ureteroscopic stone manipulation.   Past Medical History:  Diagnosis Date  . Bilateral carotid artery stenosis without cerebral infarction    ASYMPTOMATIC--  BILATERAL ICA  50-69%  PER CARDIOLOGIST NOTE, DR Einar Gip  . Bilateral carotid bruits    LEFT  soft bruit, right no no carotid bruit per 07-24-2014 dr Einar Gip note  . CKD (chronic kidney disease)    dr foster [place] kidney, ckd stage 3 per lov 02-16-2019  . Complication of anesthesia    1 episode atrial fib 02-09-2014 [facility] and followed up with cardiology, not further issue  . Coronary artery disease    NON-OBSTRUCTIVE CAD  per cath 08-06-2004 HX PALPITATIONS-AND -TACHYCARDIA-  . First degree heart block   . Full dentures   . GERD (gastroesophageal reflux disease)   . Heart palpitations    Not any more  . History of atrial fibrillation without current medication    EPISODE OF AFIB WITH RVR INTRAOPERATIVELY 02-09-2014 AT [facility]--  RESOLVED AND PT FOLLOWED UP WITH CARDIOLOGIST  DR Einar Gip  . History of kidney stones   . Hyperlipidemia   . Hypertension   . Nephrolithiasis    RIGHT  . Organic impotence   . Psoriasis    Clearing up  . PSVT (paroxysmal supraventricular tachycardia) ([place])   . Right ureteral calculus   . S/P radiofrequency ablation operation for arrhythmia-SVT 04/28/2014  . Simple renal cyst    right  . Wears glasses   . Wears hearing aid    BILATERAL    Past Surgical History:  Procedure Laterality Date  . CARDIAC CATHETERIZATION  08-06-2004 dr Verlon Setting   (abnormal stress test) Non-obstructive CAD, pLAD 20-30%,  LCX 30-40%, pRCA 20-30%, dRCA 40%, distal LM  mild diffuse calcifation 20-30% tapering stenosis,   preserved LVSF ef 55-60%  . CARPAL TUNNEL RELEASE Bilateral 2005  . CATARACT EXTRACTION, BILATERAL Bilateral 2011  . CHOLECYSTECTOMY OPEN  1982   and NEPHROLITHOTOMY  . COLONOSCOPY W/ POLYPECTOMY  last one 04-05-2008  . CYSTOSCOPY WITH LITHOLAPAXY Right 04/14/2017   Procedure: CYSTOSCOPY WITH LITHOLAPAXY/RIGHT RETROGRADE PLYOGRAM/RIGHT URETEROSCOPY;  Surgeon: [name], MD;  [facility];  Service: Urology;  Laterality: Right;  . CYSTOSCOPY WITH RETROGRADE PYELOGRAM, URETEROSCOPY AND STENT PLACEMENT Right 03/14/2014   Procedure: CYSTO/RIGHT RETROGRADE PYELOGRAM/RIGHT URETEROSCOPY/STONE EXTRACTION/STENT PLACEMENT;  Surgeon: Malka So, MD;  Location[facility];  Service: Urology;  Laterality: Right;  . CYSTOSCOPY WITH RETROGRADE PYELOGRAM, URETEROSCOPY AND STENT PLACEMENT Left 04/19/2019   Procedure: CYSTOSCOPY WITH LEFT  RETROGRADE LEFT , URETEROSCOPY WITH HOLMIUM LASER, BASKET EXTRACTION AND STENT PLACEMENT;  Surgeon: [name], MD;  Location: [facility];  Service: Urology;  Laterality: Left;  . EYE SURGERY Bilateral    Cataracts removed  . HOLMIUM LASER APPLICATION Right 07/14/945   Procedure: RIGHT HOLMIUM LASER APPLICATION;  Surgeon: Malka So, MD;  Location: [facility];  Service: Urology;  Laterality: Right;  . KNEE ARTHROSCOPY W/ DEBRIDEMENT Right 11-23-2002   and Synovectomy  . LUMBAR LAMINECTOMY/DECOMPRESSION MICRODISCECTOMY N/A 07/15/2018   Procedure: Posterior lumbar decompression and fusion L4-5;  Surgeon: Melina Schools, MD;  Location: [place];  Service: Orthopedics;  Laterality:  N/A;  4 hrs  . NEPHROLITHOTOMY  01/16/2011   Procedure: NEPHROLITHOTOMY PERCUTANEOUS;  Surgeon: Malka So;  [facility];  Service: Urology;  Laterality: Left;  C-Arm  Holmium Laser  . NEPHROLITHOTOMY  1981  . SUPRAVENTRICULAR TACHYCARDIA ABLATION N/A 04/28/2014   Procedure: SUPRAVENTRICULAR TACHYCARDIA ABLATION;  Surgeon: Evans Lance, MD;   Location: [facility] C[facility] LAB;  Service: Cardiovascular;  Laterality: N/A;    Family History  Problem Relation Age of Onset  . Cancer Mother        Ovarian or colon  . Heart attack Father   . Heart attack Brother    Social History:  reports that he quit smoking about 22 years ago. His smoking use included cigarettes. He has a 40.00 pack-year smoking history. He has never used smokeless tobacco. He reports that he does not drink alcohol and does not use drugs.  Allergies: No Known Allergies  No medications prior to admission.    Results for orders placed or performed during the hospital encounter of 03/29/20 (from the past 48 hour(s))  SARS CORONAVIRUS 2 (TAT 6-24 HRS) Nasopharyngeal Nasopharyngeal Swab     Status: None   Collection Time: 03/29/20  1:50 PM   Specimen: Nasopharyngeal Swab  Result Value Ref Range   SARS Coronavirus 2 NEGATIVE NEGATIVE    Comment: (NOTE) SARS-CoV-2 target nucleic acids are NOT DETECTED.  The SARS-CoV-2 RNA is generally detectable in upper and lower respiratory specimens during the acute phase of infection. Negative results do not preclude SARS-CoV-2 infection, do not rule out co-infections with other pathogens, and should not be used as the sole basis for treatment or other patient management decisions. Negative results must be combined with clinical observations, patient history, and epidemiological information. The expected result is Negative.  Fact Sheet for Patients: SugarRoll.be  Fact Sheet for Healthcare Providers: https://www.woods-mathews.com/  This test is not yet approved or cleared by the [place] FDA and  has been authorized for detection and/or diagnosis of SARS-CoV-2 by FDA under an Emergency Use Authorization (EUA). This EUA will remain  in effect (meaning this test can be used) for the duration of the COVID-19 declaration under Se ction 564(b)(1) of the Act, 21 U.S.C. section  360bbb-3(b)(1), unless the authorization is terminated or revoked sooner.  Performed at [facility], [place] [address] [place], [place] [address]    No results found.  Review of Systems  Constitutional: Negative for chills and fever.  Genitourinary: Positive for flank pain.  All other systems reviewed and are negative.   Height 5' 10.5" (1.791 m), weight 88.5 kg. Physical Exam Vitals reviewed.  HENT:     Head: Normocephalic.     Nose: Nose normal.  Eyes:     Pupils: Pupils are equal, round, and reactive to light.  Cardiovascular:     Rate and Rhythm: Normal rate.     Pulses: Normal pulses.  Abdominal:     General: Abdomen is flat.     Comments: Prior scars w/o hernias.   Genitourinary:    Comments: Mild left CVAT at present.  Musculoskeletal:        General: Normal range of motion.     Cervical back: Normal range of motion.  Skin:    General: Skin is warm.  Neurological:     General: No focal deficit present.     Mental Status: He is alert.      Assessment/Plan  Proceed as planned with LEFT Ureteroscopic stone manipulation. Risks, benefits, alternatives, expected  peri-op course including need for possible temporary stent, discussed previously and reiterated today.   Alexis Frock, MD 03/30/2020, 5:48 AM

## 2020-03-30 NOTE — Discharge Instructions (Signed)
1 - You may have urinary urgency (bladder spasms) and bloody urine on / off with stent in place. This is normal.  2 - Remove tethered stent on Monday morning at home by pulling on string, then blue-white plastic tubing, and discarding. Office is open Monday if any problems arise.   3 - Call MD or go to ER for fever >102, severe pain / nausea / vomiting not relieved by medications, or acute change in medical status    [facility] [phone] Post Ureteroscopy With or Without Stent Instructions  Definitions:  Ureter: The duct that transports urine from the kidney to the bladder. Stent:   A plastic hollow tube that is placed into the ureter, from the kidney to the bladder to prevent the ureter from swelling shut.  GENERAL INSTRUCTIONS:  Despite the fact that no skin incisions were used, the area around the ureter and bladder is raw and irritated. The stent is a foreign body which will further irritate the bladder wall. This irritation is manifested by increased frequency of urination, both day and night, and by an increase in the urge to urinate. In some, the urge to urinate is present almost always. Sometimes the urge is strong enough that you may not be able to stop yourself from urinating. The only real cure is to remove the stent and then give time for the bladder wall to heal which can't be done until the danger of the ureter swelling shut has passed, which varies.  You may see some blood in your urine while the stent is in place and a few days afterwards. Do not be alarmed, even if the urine was clear for a while. Get off your feet and drink lots of fluids until clearing occurs. If you start to pass clots or don't improve, call us.  DIET: You may return to your normal diet immediately. Because of the raw surface of your bladder, alcohol, spicy foods, acid type foods and drinks with caffeine may cause irritation or frequency and should be used in moderation. To keep your  urine flowing freely and to avoid constipation, drink plenty of fluids during the day ( 8-10 glasses ). Tip: Avoid cranberry juice because it is very acidic.  ACTIVITY: Your physical activity doesn't need to be restricted. However, if you are very active, you may see some blood in your urine. We suggest that you reduce your activity under these circumstances until the bleeding has stopped.  BOWELS: It is important to keep your bowels regular during the postoperative period. Straining with bowel movements can cause bleeding. A bowel movement every other day is reasonable. Use a mild laxative if needed, such as Milk of Magnesia 2-3 tablespoons, or 2 Dulcolax tablets. Call if you continue to have problems. If you have been taking narcotics for pain, before, during or after your surgery, you may be constipated. Take a laxative if necessary.   MEDICATION: You should resume your pre-surgery medications unless told not to. In addition you will often be given an antibiotic to prevent infection. These should be taken as prescribed until the bottles are finished unless you are having an unusual reaction to one of the drugs.  PROBLEMS YOU SHOULD REPORT TO [place]:  Fevers over 100.5 Fahrenheit.  Heavy bleeding, or clots ( See above notes about blood in urine ).  Inability to urinate.  Drug reactions ( hives, rash, nausea, vomiting, diarrhea ).  Severe burning or pain with urination that is not improving.  FOLLOW-UP: You will  need a follow-up appointment to monitor your progress. Call for this appointment at the number listed above. Usually the first appointment will be about three to fourteen days after your surgery.    Post Anesthesia Home Care Instructions  Activity: Get plenty of rest for the remainder of the day. A responsible individual must stay with you for 24 hours following the procedure.  For the next 24 hours, DO NOT: -Drive a car -Paediatric nurse -Drink alcoholic beverages -Take  any medication unless instructed by your physician -Make any legal decisions or sign important papers.  Meals: Start with liquid foods such as gelatin or soup. Progress to regular foods as tolerated. Avoid greasy, spicy, heavy foods. If nausea and/or vomiting occur, drink only clear liquids until the nausea and/or vomiting subsides. Call your physician if vomiting continues.  Special Instructions/Symptoms: Your throat may feel dry or sore from the anesthesia or the breathing tube placed in your throat during surgery. If this causes discomfort, gargle with warm salt water. The discomfort should disappear within 24 hours.

## 2020-03-30 NOTE — Anesthesia Preprocedure Evaluation (Addendum)
Anesthesia Evaluation  Patient identified by MRN, date of birth, ID band Patient awake    Reviewed: Allergy & Precautions, NPO status , Patient's Chart, lab work & pertinent test results  History of Anesthesia Complications Negative for: history of anesthetic complications  Airway Mallampati: II  TM Distance: >3 FB Neck ROM: Full    Dental  (+) Edentulous Upper, Edentulous Lower, Lower Dentures, Upper Dentures   Pulmonary former smoker,  03/29/2020 SARS coronavirus NEG   breath sounds clear to auscultation       Cardiovascular hypertension, Pt. on medications (-) angina+ CAD (non-obstructive by cath '06) and + Peripheral Vascular Disease  + dysrhythmias Atrial Fibrillation and Supra Ventricular Tachycardia  Rhythm:Regular Rate:Normal     Neuro/Psych negative neurological ROS     GI/Hepatic Neg liver ROS, GERD  Medicated and Controlled,  Endo/Other  negative endocrine ROS  Renal/GU Renal InsufficiencyRenal diseasestones     Musculoskeletal   Abdominal   Peds  Hematology  (+) Blood dyscrasia (Hb 10.9), anemia ,   Anesthesia Other Findings   Reproductive/Obstetrics                            Anesthesia Physical Anesthesia Plan  ASA: III  Anesthesia Plan: General   Post-op Pain Management:    Induction: Intravenous  PONV Risk Score and Plan: 2 and Ondansetron and Dexamethasone  Airway Management Planned: LMA  Additional Equipment: None  Intra-op Plan:   Post-operative Plan:   Informed Consent: I have reviewed the patients History and Physical, chart, labs and discussed the procedure including the risks, benefits and alternatives for the proposed anesthesia with the patient or authorized representative who has indicated his/her understanding and acceptance.       Plan Discussed with: CRNA and Surgeon  Anesthesia Plan Comments:        Anesthesia Quick Evaluation

## 2020-03-30 NOTE — Brief Op Note (Signed)
03/30/2020  1:36 PM  PATIENT:  Mallie Mussel  87 y.o. male  PRE-OPERATIVE DIAGNOSIS:  LEFT URETERAL STONES  POST-OPERATIVE DIAGNOSIS:  LEFT URETERAL STONES  PROCEDURE:  Procedure(s): CYSTOSCOPY WITH RETROGRADE PYELOGRAM, URETEROSCOPY, BASKETTING OF STONE AND STENT PLACEMENT (Left)  SURGEON:  Surgeon(s) and Role:    * Alexis Frock, MD - Primary  PHYSICIAN ASSISTANT:   ASSISTANTS: none   ANESTHESIA:   general  EBL:  5 mL   BLOOD ADMINISTERED:none  DRAINS: none   LOCAL MEDICATIONS USED:  NONE  SPECIMEN:  Source of Specimen:  left ureteral stone fragments  DISPOSITION OF SPECIMEN:  [facility] for compositional analysis  COUNTS:  YES  TOURNIQUET:  * No tourniquets in log *  DICTATION: .Other Dictation: Dictation Number [id number]  PLAN OF CARE: Discharge to home after PACU  PATIENT DISPOSITION:  PACU - hemodynamically stable.   Delay start of Pharmacological VTE agent (>24hrs) due to surgical blood loss or risk of bleeding: not applicable

## 2020-03-30 NOTE — Anesthesia Procedure Notes (Signed)
Procedure Name: LMA Insertion Date/Time: 03/30/2020 1:06 PM Performed by: Bufford Spikes, CRNA Pre-anesthesia Checklist: Patient identified, Emergency Drugs available, Suction available and Patient being monitored Patient Re-evaluated:Patient Re-evaluated prior to induction Oxygen Delivery Method: Circle system utilized Preoxygenation: Pre-oxygenation with 100% oxygen Induction Type: IV induction Ventilation: Mask ventilation without difficulty LMA: LMA inserted LMA Size: 4.0 Number of attempts: 1 Placement Confirmation: positive ETCO2 Tube secured with: Tape Dental Injury: Teeth and Oropharynx as per pre-operative assessment

## 2020-03-30 NOTE — Transfer of Care (Signed)
Immediate Anesthesia Transfer of Care Note  Patient: Brandon Fischer  Procedure(s) Performed: CYSTOSCOPY WITH RETROGRADE PYELOGRAM, URETEROSCOPY, BASKETTING OF STONE AND STENT PLACEMENT (Left Renal)  Patient Location: PACU  Anesthesia Type:General  Level of Consciousness: awake, alert  and oriented  Airway & Oxygen Therapy: Patient Spontanous Breathing and Patient connected to nasal cannula oxygen  Post-op Assessment: Report given to RN and Post -op Vital signs reviewed and stable  Post vital signs: Reviewed and stable  Last Vitals:  Vitals Value Taken Time  BP 148/66 03/30/20 1342  Temp    Pulse 66 03/30/20 1345  Resp 14 03/30/20 1345  SpO2 100 % 03/30/20 1345  Vitals shown include unvalidated device data.  Last Pain:  Vitals:   03/30/20 1128  TempSrc: Oral  PainSc: 0-No pain         Complications: No complications documented.

## 2020-03-30 NOTE — Anesthesia Postprocedure Evaluation (Signed)
Anesthesia Post Note  Patient: Brandon Fischer  Procedure(s) Performed: CYSTOSCOPY WITH RETROGRADE PYELOGRAM, URETEROSCOPY, BASKETTING OF STONE AND STENT PLACEMENT (Left Renal)     Patient location during evaluation: Phase II Anesthesia Type: General Level of consciousness: awake and alert, patient cooperative and oriented Pain management: pain level controlled Vital Signs Assessment: post-procedure vital signs reviewed and stable Respiratory status: spontaneous breathing, nonlabored ventilation and respiratory function stable Cardiovascular status: blood pressure returned to baseline and stable Postop Assessment: no apparent nausea or vomiting, able to ambulate and adequate PO intake Anesthetic complications: no   No complications documented.  Last Vitals:  Vitals:   03/30/20 1400 03/30/20 1413  BP: (!) 143/58 133/60  Pulse: (!) 49 67  Resp: 15 16  Temp:    SpO2: 99% 100%    Last Pain:  Vitals:   03/30/20 1128  TempSrc: Oral  PainSc: 0-No pain                 JACKSON,E. CARSWELL

## 2020-04-01 NOTE — Op Note (Signed)
NAME: Brandon Fischer, Brandon Fischer MEDICAL RECORD NO: [id number] ACCOUNT NO: [id number] DATE OF BIRTH: [birth year] FACILITY: [facility] LOCATION: [facility] PHYSICIAN: Alexis Frock, MD  Operative Report   DATE OF PROCEDURE: 03/30/2020  PREOPERATIVE DIAGNOSIS:  Left ureteral stones, small bladder stones.  PROCEDURE PERFORMED:   1.  Cystoscopy, left retrograde pyelogram and interpretation. 2.  Left ureteroscopy with basketing of stones. 3.  Insertion of left ureteral stent.  ESTIMATED BLOOD LOSS:  Nil.  COMPLICATIONS:  None.  SPECIMEN:  Left ureteral stone for analysis.  FINDINGS:   1.  Interval passage of approximately 70% of left ureteral stone burden in the bladder.   2.  Residual small volume left intrarenal stones. 3.  Complete resolution of stones larger than 1/4mm following basket extraction on the left side. 4.  Placement of left ureteral stent, proximal and renal pelvis, distal end in the urinary bladder.  INDICATIONS:  The patient is a very pleasant 87 year old man with longstanding history of recurrent urolithiasis.  He was found on a workup of colicky flank pain to have a fairly significant volume of recurrent left proximal ureteral stone for management.   Given the multifocality of his stone disease, he wishes to proceed with a left ureteroscopy with goal of left side stone free.  Informed consent was obtained and placed in medical record.  PROCEDURE IN DETAIL:  The patient is being verified, the procedure being left ureteroscopic stone manipulation was confirmed.  Procedure timeout was performed.  Intravenous antibiotics were administered.  General LMA anesthesia induced.  The patient was placed in the low lithotomy  position.  Sterile field was created, prepped and draped base of his penis, perineum and proximal thighs using iodine.  Cystourethroscopy was performed with a 21-French rigid cystoscope with offset lens.  Both anterior and posterior urethra revealed only  some age  appropriate bilobar prostatic hypertrophy.  Inspection of the urinary bladder revealed some moderate trabeculation.  Again, age appropriate, there were several calcifications in the left bladder neck area, more so than anticipated by his  previous CT scan.  He has some possibility of interval antegrade stone passage to the level of the urinary bladder.  All the stone fragments were irrigated and set aside; however, given the multifocality of stones it was clearly felt that interrogation  of his left ureter directly with ureteroscopy was clearly still warranted.  Left ureteral orifice was then cannulated with a 6-French renal catheter and left retrograde pyelogram was obtained.  Left retrograde pyelogram demonstrated a single system left kidney.  There is questionable filling defect in the proximal ureter consistent with stone.  There is mild-to-moderate hydronephrosis.  It actually appeared quite improved compared to his  recent prior CAT scan.  A 0.03 ZIPwire was then slowly pulled aside as a safety wire.  An 8-French feeding tube placed in the urinary bladder for pressure release and semi-rigid ureteroscopy was performed of the distal 4/5 of the left ureter alongside a sensor   working wire.  There were some areas of mucosal edema and erythema in the proximal ureter, likely consistent with a prior site of stone impaction, but no visualized urolithiasis in the distal 4/5 left ureter.  The semirigid scope was then exchanged for a  12/14 medium length ureteral access sheath to the level of the proximal ureter.  Using continuous fluoroscopic guidance and flexible digital ureteroscopy was performed in the proximal left ureter, and systematic inspection of the left kidney including  all calices x3.  There was multifocal relatively small volume intraluminal  stones in the left kidney.  There were two foci, each approximately 3 mm.  These were amenable to simple basketing alone using the escape basket and they were  retrieved in this  fashion.  Following this, there was complete resolution of all accessible stone very much larger than 1-2 mm within the left kidney.  Hemostasis was excellent.  There was no evidence of any perforation.  The access sheath was removed under continuous  vision.  No additional calcifications or significant mucosal abnormalities were noted.  There was overall quite favorable, given access sheath usage it was felt that a brief interval stenting with a tether stent will be warranted. As such, a new 5 x 26  Polaris type stent was placed over the safety wire using fluoroscopic guidance.  Good proximal and distal planes were noted.  Tether was left in place and fashioned into the dorsum of the penis and the procedure was terminated.  The patient tolerated the  procedure well.  There was no immediate periprocedural complications.  INDICATIONS:  The patient was taken to [place] in stable condition.  Plan for discharge home.   SUJ/SUN D: 03/30/2020 1:41:43 pm T: 03/31/2020 8:12:00 am  JOB: [id number]/ [id number]

## 2020-04-02 ENCOUNTER — Encounter (HOSPITAL_BASED_OUTPATIENT_CLINIC_OR_DEPARTMENT_OTHER): Payer: Self-pay | Admitting: Urology

## 2020-04-24 DIAGNOSIS — N202 Calculus of kidney with calculus of ureter: Secondary | ICD-10-CM | POA: Diagnosis not present

## 2020-04-26 DIAGNOSIS — L570 Actinic keratosis: Secondary | ICD-10-CM | POA: Diagnosis not present

## 2020-05-05 DIAGNOSIS — N200 Calculus of kidney: Secondary | ICD-10-CM | POA: Diagnosis not present

## 2020-05-06 DIAGNOSIS — N200 Calculus of kidney: Secondary | ICD-10-CM | POA: Diagnosis not present

## 2020-05-07 DIAGNOSIS — I10 Essential (primary) hypertension: Secondary | ICD-10-CM | POA: Diagnosis not present

## 2020-05-07 DIAGNOSIS — E7800 Pure hypercholesterolemia, unspecified: Secondary | ICD-10-CM | POA: Diagnosis not present

## 2020-05-07 DIAGNOSIS — R413 Other amnesia: Secondary | ICD-10-CM | POA: Diagnosis not present

## 2020-05-10 DIAGNOSIS — I6523 Occlusion and stenosis of bilateral carotid arteries: Secondary | ICD-10-CM | POA: Diagnosis not present

## 2020-05-10 DIAGNOSIS — M545 Low back pain, unspecified: Secondary | ICD-10-CM | POA: Diagnosis not present

## 2020-05-10 DIAGNOSIS — E7800 Pure hypercholesterolemia, unspecified: Secondary | ICD-10-CM | POA: Diagnosis not present

## 2020-05-10 DIAGNOSIS — G4733 Obstructive sleep apnea (adult) (pediatric): Secondary | ICD-10-CM | POA: Diagnosis not present

## 2020-05-10 DIAGNOSIS — I440 Atrioventricular block, first degree: Secondary | ICD-10-CM | POA: Diagnosis not present

## 2020-05-10 DIAGNOSIS — Z0001 Encounter for general adult medical examination with abnormal findings: Secondary | ICD-10-CM | POA: Diagnosis not present

## 2020-05-10 DIAGNOSIS — K219 Gastro-esophageal reflux disease without esophagitis: Secondary | ICD-10-CM | POA: Diagnosis not present

## 2020-05-10 DIAGNOSIS — R161 Splenomegaly, not elsewhere classified: Secondary | ICD-10-CM | POA: Diagnosis not present

## 2020-05-10 DIAGNOSIS — N1831 Chronic kidney disease, stage 3a: Secondary | ICD-10-CM | POA: Diagnosis not present

## 2020-05-10 DIAGNOSIS — R413 Other amnesia: Secondary | ICD-10-CM | POA: Diagnosis not present

## 2020-05-10 DIAGNOSIS — N400 Enlarged prostate without lower urinary tract symptoms: Secondary | ICD-10-CM | POA: Diagnosis not present

## 2020-05-10 DIAGNOSIS — I10 Essential (primary) hypertension: Secondary | ICD-10-CM | POA: Diagnosis not present

## 2020-05-29 DIAGNOSIS — R82998 Other abnormal findings in urine: Secondary | ICD-10-CM | POA: Diagnosis not present

## 2020-05-29 DIAGNOSIS — N202 Calculus of kidney with calculus of ureter: Secondary | ICD-10-CM | POA: Diagnosis not present

## 2020-05-29 DIAGNOSIS — R34 Anuria and oliguria: Secondary | ICD-10-CM | POA: Diagnosis not present

## 2020-06-06 ENCOUNTER — Other Ambulatory Visit: Payer: Self-pay

## 2020-06-06 ENCOUNTER — Encounter: Payer: Self-pay | Admitting: Cardiology

## 2020-06-06 ENCOUNTER — Ambulatory Visit: Payer: Medicare Other | Admitting: Cardiology

## 2020-06-06 VITALS — BP 137/54 | HR 79 | Temp 98.9°F | Resp 17 | Ht 70.0 in | Wt 184.8 lb

## 2020-06-06 DIAGNOSIS — I440 Atrioventricular block, first degree: Secondary | ICD-10-CM

## 2020-06-06 DIAGNOSIS — R012 Other cardiac sounds: Secondary | ICD-10-CM

## 2020-06-06 DIAGNOSIS — R9431 Abnormal electrocardiogram [ECG] [EKG]: Secondary | ICD-10-CM

## 2020-06-06 DIAGNOSIS — E7800 Pure hypercholesterolemia, unspecified: Secondary | ICD-10-CM

## 2020-06-06 DIAGNOSIS — R0989 Other specified symptoms and signs involving the circulatory and respiratory systems: Secondary | ICD-10-CM

## 2020-06-06 DIAGNOSIS — I10 Essential (primary) hypertension: Secondary | ICD-10-CM

## 2020-06-06 NOTE — Progress Notes (Signed)
Primary Physician/Referring:  Deland Pretty, MD  Patient ID: Brandon Fischer, male    DOB: [birth year], 87 y.o.   MRN: [id number]  Chief Complaint  Patient presents with  . New Patient (Initial Visit)  . Hypertension    Ref by Dr. Deland Pretty  . Abnormal ECG   HPI:    BENJIMIN Fischer  is a 87 y.o. Caucasian male with hypertension, hyperlipidemia, SVT ablation in 2016, stage IIIa chronic kidney disease, first-degree AV block by EKG, bilateral carotid bruit, referred to me due to abnormal EKG.  I used to take care of his wife Brandon Fischer, who passed away a year ago and patient is still grieving.  Since then he has noticed fatigue.  Patient denies chest pain, shortness of breath, dizziness, near-syncope.  He has been using stationary bicycle 3 times a week and goes for a walk around his house on a daily basis and has not noticed anything different.  Past Medical History:  Diagnosis Date  . Bilateral carotid artery stenosis without cerebral infarction    ASYMPTOMATIC--  BILATERAL ICA  50-69%  PER CARDIOLOGIST NOTE, DR Einar Gip  . Bilateral carotid bruits    LEFT  soft bruit, right no no carotid bruit per 07-24-2014 dr Einar Gip note  . CKD (chronic kidney disease)    dr foster [place] kidney, ckd stage 3 per lov 02-16-2019  . Complication of anesthesia    1 episode atrial fib 02-09-2014 st [facility] and followed up with cardiology, not further issue  . Coronary artery disease    NON-OBSTRUCTIVE CAD  per cath 08-06-2004 HX PALPITATIONS-AND -TACHYCARDIA-  . First degree heart block   . Full dentures   . GERD (gastroesophageal reflux disease)   . Heart palpitations    Not any more  . History of atrial fibrillation without current medication    EPISODE OF AFIB WITH RVR INTRAOPERATIVELY 02-09-2014 AT [facility]--  RESOLVED AND PT FOLLOWED UP WITH CARDIOLOGIST  DR Einar Gip  . History of kidney stones   . Hyperlipidemia   . Hypertension   . Nephrolithiasis    RIGHT  . Organic impotence   .  Psoriasis    Clearing up  . PSVT (paroxysmal supraventricular tachycardia) ([place])   . Right ureteral calculus   . S/P radiofrequency ablation operation for arrhythmia-SVT 04/28/2014  . Simple renal cyst    right  . Wears glasses   . Wears hearing aid    BILATERAL   Past Surgical History:  Procedure Laterality Date  . CARDIAC CATHETERIZATION  08-06-2004 dr Verlon Setting   (abnormal stress test) Non-obstructive CAD, pLAD 20-30%,  LCX 30-40%, pRCA 20-30%, dRCA 40%, distal LM  mild diffuse calcifation 20-30% tapering stenosis,  preserved LVSF ef 55-60%  . CARPAL TUNNEL RELEASE Bilateral 2005  . CATARACT EXTRACTION, BILATERAL Bilateral 2011  . CHOLECYSTECTOMY OPEN  1982   and NEPHROLITHOTOMY  . COLONOSCOPY W/ POLYPECTOMY  last one 04-05-2008  . CYSTOSCOPY WITH LITHOLAPAXY Right 04/14/2017   Procedure: CYSTOSCOPY WITH LITHOLAPAXY/RIGHT RETROGRADE PLYOGRAM/RIGHT URETEROSCOPY;  Surgeon: [name], MD;  Location: [facility];  Service: Urology;  Laterality: Right;  . CYSTOSCOPY WITH RETROGRADE PYELOGRAM, URETEROSCOPY AND STENT PLACEMENT Right 03/14/2014   Procedure: CYSTO/RIGHT RETROGRADE PYELOGRAM/RIGHT URETEROSCOPY/STONE EXTRACTION/STENT PLACEMENT;  Surgeon: Malka So, MD;  Location: [facility];  Service: Urology;  Laterality: Right;  . CYSTOSCOPY WITH RETROGRADE PYELOGRAM, URETEROSCOPY AND STENT PLACEMENT Left 04/19/2019   Procedure: CYSTOSCOPY WITH LEFT  RETROGRADE LEFT , URETEROSCOPY WITH HOLMIUM LASER, BASKET EXTRACTION AND STENT  PLACEMENT;  Surgeon: [name], MD;  Location: [facility];  Service: Urology;  Laterality: Left;  . CYSTOSCOPY WITH RETROGRADE PYELOGRAM, URETEROSCOPY AND STENT PLACEMENT Left 03/30/2020   Procedure: CYSTOSCOPY WITH RETROGRADE PYELOGRAM, URETEROSCOPY, BASKETTING OF STONE AND STENT PLACEMENT;  Surgeon: Alexis Frock, MD;  Location: [facility];  Service: Urology;  Laterality: Left;  . EYE SURGERY Bilateral    Cataracts removed  .  HOLMIUM LASER APPLICATION Right 02/20/784   Procedure: RIGHT HOLMIUM LASER APPLICATION;  Surgeon: Malka So, MD;  Location: [facility];  Service: Urology;  Laterality: Right;  . KNEE ARTHROSCOPY W/ DEBRIDEMENT Right 11-23-2002   and Synovectomy  . LUMBAR LAMINECTOMY/DECOMPRESSION MICRODISCECTOMY N/A 07/15/2018   Procedure: Posterior lumbar decompression and fusion L4-5;  Surgeon: Melina Schools, MD;  Location: [place];  Service: Orthopedics;  Laterality: N/A;  4 hrs  . NEPHROLITHOTOMY  01/16/2011   Procedure: NEPHROLITHOTOMY PERCUTANEOUS;  Surgeon: Malka So;  Location: [facility];  Service: Urology;  Laterality: Left;  C-Arm  Holmium Laser  . NEPHROLITHOTOMY  1981  . SUPRAVENTRICULAR TACHYCARDIA ABLATION N/A 04/28/2014   Procedure: SUPRAVENTRICULAR TACHYCARDIA ABLATION;  Surgeon: Evans Lance, MD;  Location[facility];  Service: Cardiovascular;  Laterality: N/A;   Family History  Problem Relation Age of Onset  . Cancer Mother        Ovarian or colon  . Heart attack Father   . Heart attack Brother     Social History   Tobacco Use  . Smoking status: Former Smoker    Packs/day: 1.00    Years: 40.00    Pack years: 40.00    Types: Cigarettes    Quit date: 02/06/1998    Years since quitting: 22.3  . Smokeless tobacco: Never Used  Substance Use Topics  . Alcohol use: No   Marital Status: Widowed  ROS  Review of Systems  HENT: Positive for hearing loss.   Cardiovascular: Negative for chest pain, dyspnea on exertion and leg swelling.  Gastrointestinal: Negative for melena.   Objective  Blood pressure (!) 137/54, pulse 79, temperature 98.9 F (37.2 C), temperature source Temporal, resp. rate 17, height 5' 10" (1.778 m), weight 184 lb 12.8 oz (83.8 kg), SpO2 98 %. Body mass index is 26.52 kg/m.   Vitals with BMI 06/06/2020 03/30/2020 03/30/2020  Height 5' 10" - -  Weight 184 lbs 13 oz - -  BMI 75.44 - -  Systolic 920 100 712  Diastolic 54 78 60  Pulse 79 56 67      Physical Exam HENT:     Head: Atraumatic.  Neck:     Vascular: Carotid bruit (Bilateral) present. No JVD.  Cardiovascular:     Rate and Rhythm: Normal rate and regular rhythm.     Pulses: Normal pulses and intact distal pulses.     Heart sounds: S1 normal and S2 normal. Murmur heard.   Crescendo systolic murmur is present with a grade of 2/6 at the upper right sternal border.  Early diastolic murmur is present with a grade of 2/4 at the upper right sternal border. No gallop.   Pulmonary:     Effort: Pulmonary effort is normal.     Breath sounds: Normal breath sounds.  Abdominal:     General: Bowel sounds are normal.     Palpations: Abdomen is soft.    Laboratory examination:   Recent Labs    07/17/19 2358 07/18/19 0007 07/23/19 1854 07/27/19 1054 03/30/20 1134  NA  139   < > 135 136 142  K 3.6   < > 3.8 4.3 3.7  CL 102   < > 96* 100 103  CO2 25  --  28 26  --   GLUCOSE 135*   < > 119* 149* 117*  BUN 20   < > _0 CREATININE 1.70*   < > 1.37* 1.37* 1.60*  CALCIUM 9.0  --  8.8* 8.7*  --   GFRNONAA 36*  --  47* 47*  --   GFRAA 42*  --  55* 55*  --    < > = values in this interval not displayed.   CrCl cannot be calculated (Patient's most recent lab result is older than the maximum 21 days allowed.).  CMP Latest Ref Rng & Units 03/30/2020 07/27/2019 07/23/2019  Glucose 70 - 99 mg/dL 117(H) 149(H) 119(H)  BUN 8 - 23 mg/dL _1 Creatinine 0.61 - 1.24 mg/dL 1.60(H) 1.37(H) 1.37(H)  Sodium 135 - 145 mmol/L 142 136 135  Potassium 3.5 - 5.1 mmol/L 3.7 4.3 3.8  Chloride 98 - 111 mmol/L 103 100 96(L)  CO2 22 - 32 mmol/L - 26 28  Calcium 8.9 - 10.3 mg/dL - 8.7(L) 8.8(L)  Total Protein 6.5 - 8.1 g/dL - - -  Total Bilirubin 0.3 - 1.2 mg/dL - - -  Alkaline Phos 38 - 126 U/L - - -  AST 15 - 41 U/L - - -  ALT 0 - 44 U/L - - -   CBC Latest Ref Rng & Units 03/30/2020 07/27/2019 07/23/2019  WBC 4.0 - 10.5 K/uL - 6.4 7.8  Hemoglobin 13.0 - 17.0 g/dL 10.9(L) 10.9(L) 10.9(L)   Hematocrit 39.0 - 52.0 % 32.0(L) 34.0(L) 32.9(L)  Platelets 150 - 400 K/uL - 234 190    Recent Labs    07/18/19 0017  TSH 5.133*    External labs:   Lab 05/12/2020:  TSH mildly elevated at 7.140.  Vitamin B12 866.  Hb 10.7/HCT 33.6, platelets 143 (140-400).  Microcytic indicis.  Serum glucose 104 mg, BUN 17, creatinine 1.47, EGFR 46 mL, potassium 4.6, CMP otherwise normal.  Total cholesterol 121, triglycerides 63, HDL 47, LDL 61.  Medications and allergies   Allergies  Allergen Reactions  . Bupropion     Other reaction(s): disoriented  . Olmesartan     Other reaction(s): rash  . Trazodone     Other reaction(s): unsteady gait     Outpatient Medications Prior to Visit  Medication Sig Dispense Refill  . amLODipine (NORVASC) 10 MG tablet Take 10 mg by mouth daily.     . Ascorbic Acid (VITAMIN C PO) Take by mouth daily.    [name] atorvastatin (LIPITOR) 20 MG tablet Take 20 mg by mouth daily.     . Cholecalciferol (VITAMIN D-3) 25 MCG (1000 UT) CAPS Take 1,000 Units by mouth daily with breakfast.    . levocetirizine (XYZAL) 5 MG tablet Take 1 tablet by mouth daily.    [name] losartan (COZAAR) 50 MG tablet Take 1 tablet by mouth daily.    . Melatonin 1 MG CAPS Take 1 capsule by mouth at bedtime.    . pantoprazole (PROTONIX) 40 MG tablet Take 40 mg by mouth in the morning and at bedtime.     . tamsulosin (FLOMAX) 0.4 MG CAPS capsule Take 0.4 mg by mouth daily after supper.    . traMADol (ULTRAM) 50 MG tablet Take 1 tablet (50 mg total) by mouth every 6 (  six) hours as needed for moderate pain or severe pain. Post-operatively 15 tablet 0  . zinc gluconate 50 MG tablet Take 50 mg by mouth daily.    . Ascorbic Acid (VITAMIN C) 100 MG tablet Take 100 mg by mouth daily.    . Ascorbic Acid (VITAMIN C) 500 MG CHEW Chew 1 tablet by mouth daily.     No facility-administered medications prior to visit.    Medications as of current encounter Current Meds  Medication Sig  . amLODipine  (NORVASC) 10 MG tablet Take 10 mg by mouth daily.   . Ascorbic Acid (VITAMIN C PO) Take by mouth daily.  [name] atorvastatin (LIPITOR) 20 MG tablet Take 20 mg by mouth daily.   . Cholecalciferol (VITAMIN D-3) 25 MCG (1000 UT) CAPS Take 1,000 Units by mouth daily with breakfast.  . levocetirizine (XYZAL) 5 MG tablet Take 1 tablet by mouth daily.  [name] losartan (COZAAR) 50 MG tablet Take 1 tablet by mouth daily.  . Melatonin 1 MG CAPS Take 1 capsule by mouth at bedtime.  . pantoprazole (PROTONIX) 40 MG tablet Take 40 mg by mouth in the morning and at bedtime.   . tamsulosin (FLOMAX) 0.4 MG CAPS capsule Take 0.4 mg by mouth daily after supper.  . traMADol (ULTRAM) 50 MG tablet Take 1 tablet (50 mg total) by mouth every 6 (six) hours as needed for moderate pain or severe pain. Post-operatively  . zinc gluconate 50 MG tablet Take 50 mg by mouth daily.     Radiology:   No results found.  Cardiac Studies:   None  EKG:     EKG 06/06/2020: Sinus rhythm with first-degree AV block (400 ms), left axis deviation, left anterior fascicular block, incomplete right bundle branch block.  Low-voltage complexes.  No significant change from EKG 05/10/2020: Sinus rhythm with first-degree AV block at rate of 71 bpm, inferior infarct old.  Low-voltage complexes.  No evidence of ischemia.  Assessment     ICD-10-CM   1. Primary hypertension  I10 EKG 12-Lead    PCV ECHOCARDIOGRAM COMPLETE  2. Nonspecific abnormal electrocardiogram (ECG) (EKG)  R94.31   3. 1st degree AV block  I44.0   4. Hypercholesteremia  E78.00   5. Bilateral carotid bruits  R09.89 PCV CAROTID DUPLEX (BILATERAL)  6. Abnormal heart sounds  R01.2 PCV ECHOCARDIOGRAM COMPLETE    Medications Discontinued During This Encounter  Medication Reason  . Ascorbic Acid (VITAMIN C) 100 MG tablet Error  . Ascorbic Acid (VITAMIN C) 500 MG CHEW Error    No orders of the defined types were placed in this encounter.  Orders Placed This Encounter   Procedures  . EKG 12-Lead  . PCV ECHOCARDIOGRAM COMPLETE    Standing Status:   Future    Standing Expiration Date:   06/06/2021   Recommendations:   OLIN GURSKI is a 87 y.o. Caucasian male with hypertension, hyperlipidemia, SVT ablation in 2016, stage IIIa chronic kidney disease, first-degree AV block by EKG, bilateral carotid bruit, referred to me due to abnormal EKG.  I used to take care of his wife Brandon Fischer, who passed away a year ago and patient is still grieving.  Since then he has noticed fatigue.   Review of his EKG reveals sinus rhythm with very prolonged first-degree AV block at 400 ms.  He is asymptomatic with regard to conduction system disease.  Hence we will continue observation.  Hyperlipidemia is well controlled and LDL is at goal. He does have bilateral prominent  bruit, needs carotid artery duplex. Cardiac examination is suggestive of aortic regurgitation and mild aortic sclerosis.  A competent of mild mitral regurgitation cannot be excluded as well.  He needs an echocardiogram to evaluate abnormal heart sounds.  I do not think he needs any stress testing as he remains asymptomatic and has been walking around his house without any chest pain or dyspnea or exercise limitations.  I would like to see him back after the above test and make further recommendations.  He is presently not on any negative chronotropic agents.  If carotid stenosis is confirmed, he may need to be on aspirin for primary prevention.    Adrian Prows, MD, [facility] 06/06/2020, 11:40 PM Office: [phone]

## 2020-06-19 ENCOUNTER — Other Ambulatory Visit: Payer: Self-pay

## 2020-06-19 ENCOUNTER — Ambulatory Visit: Payer: Medicare Other

## 2020-06-19 DIAGNOSIS — R0989 Other specified symptoms and signs involving the circulatory and respiratory systems: Secondary | ICD-10-CM | POA: Diagnosis not present

## 2020-06-19 DIAGNOSIS — I10 Essential (primary) hypertension: Secondary | ICD-10-CM

## 2020-06-19 DIAGNOSIS — I6523 Occlusion and stenosis of bilateral carotid arteries: Secondary | ICD-10-CM | POA: Diagnosis not present

## 2020-06-19 DIAGNOSIS — R012 Other cardiac sounds: Secondary | ICD-10-CM

## 2020-06-24 NOTE — Progress Notes (Signed)
Carotid artery duplex 06/19/2020: Stenosis in the right internal carotid artery (1-15%). Stenosis in the right carotid bulb of <50%. Stenosis in the right external carotid artery (<50%). Stenosis in the left internal carotid artery (16-49%). Stenosis in the left external carotid artery (<50%). There is diffuse heterogeneous plaque in the bilateral carotid arteries. Antegrade right vertebral artery flow. Antegrade left vertebral artery flow. Compared to 11/02/2014, there was minimal plaque bilaterally. Follow up in one year is appropriate if clinically indicated.  Will discuss on OV soon

## 2020-06-24 NOTE — Progress Notes (Signed)
Echocardiogram 06/19/2020: Normal LV systolic function with visual EF 55-60%. Left ventricle cavity is normal in size. Moderate left ventricular hypertrophy. Normal global wall motion. Doppler evidence of grade II (pseudonormal) diastolic dysfunction, elevated LAP. Left atrial cavity is severely dilated. Mild (Grade I) aortic regurgitation. Mild (Grade I) mitral regurgitation. Mild tricuspid regurgitation. Mild pulmonary hypertension. RVSP measures 42 mmHg. IVC is dilated with a respiratory response of <50%. No prior study for comparison.  Will discuss on OV soon

## 2020-06-28 DIAGNOSIS — I471 Supraventricular tachycardia: Secondary | ICD-10-CM | POA: Diagnosis not present

## 2020-06-28 DIAGNOSIS — I6523 Occlusion and stenosis of bilateral carotid arteries: Secondary | ICD-10-CM | POA: Diagnosis not present

## 2020-06-28 DIAGNOSIS — Z6826 Body mass index (BMI) 26.0-26.9, adult: Secondary | ICD-10-CM | POA: Diagnosis not present

## 2020-06-28 DIAGNOSIS — R7989 Other specified abnormal findings of blood chemistry: Secondary | ICD-10-CM | POA: Diagnosis not present

## 2020-06-28 DIAGNOSIS — I2510 Atherosclerotic heart disease of native coronary artery without angina pectoris: Secondary | ICD-10-CM | POA: Diagnosis not present

## 2020-07-11 DIAGNOSIS — Z961 Presence of intraocular lens: Secondary | ICD-10-CM | POA: Diagnosis not present

## 2020-07-11 DIAGNOSIS — H43823 Vitreomacular adhesion, bilateral: Secondary | ICD-10-CM | POA: Diagnosis not present

## 2020-07-11 DIAGNOSIS — H524 Presbyopia: Secondary | ICD-10-CM | POA: Diagnosis not present

## 2020-07-11 DIAGNOSIS — H52203 Unspecified astigmatism, bilateral: Secondary | ICD-10-CM | POA: Diagnosis not present

## 2020-07-11 DIAGNOSIS — H5310 Unspecified subjective visual disturbances: Secondary | ICD-10-CM | POA: Diagnosis not present

## 2020-07-20 ENCOUNTER — Ambulatory Visit: Payer: Medicare Other | Admitting: Cardiology

## 2020-07-20 ENCOUNTER — Other Ambulatory Visit: Payer: Self-pay

## 2020-07-20 ENCOUNTER — Encounter: Payer: Self-pay | Admitting: Cardiology

## 2020-07-20 VITALS — BP 137/89 | HR 81 | Temp 98.3°F | Resp 16 | Ht 70.0 in | Wt 183.2 lb

## 2020-07-20 DIAGNOSIS — I10 Essential (primary) hypertension: Secondary | ICD-10-CM

## 2020-07-20 DIAGNOSIS — I6523 Occlusion and stenosis of bilateral carotid arteries: Secondary | ICD-10-CM | POA: Diagnosis not present

## 2020-07-20 DIAGNOSIS — E7800 Pure hypercholesterolemia, unspecified: Secondary | ICD-10-CM

## 2020-07-20 DIAGNOSIS — I440 Atrioventricular block, first degree: Secondary | ICD-10-CM | POA: Diagnosis not present

## 2020-07-20 MED ORDER — ASPIRIN 81 MG PO CHEW: 81.0000 mg | CHEWABLE_TABLET | Freq: Every day | ORAL | Status: DC

## 2020-07-20 NOTE — Progress Notes (Signed)
Primary Physician/Referring:  Deland Pretty, MD  Patient ID: Brandon Fischer, male    DOB: 22-Oct-1933, 87 y.o.   MRN: [id number]  Chief Complaint  Patient presents with   Abnormal ECG   Hypertension   Follow-up   HPI:    Brandon Fischer  is a 87 y.o. Caucasian male with hypertension, hyperlipidemia, SVT ablation in 2016, stage IIIa chronic kidney disease, first-degree AV block by EKG, bilateral carotid bruit, referred to me due to abnormal EKG.  I used to take care of his wife Mrs. Tula Nakayama, who passed away a year ago and patient is still grieving.  Since then he has noticed fatigue.  I had seen him on 06/06/2020 for abnormal EKG and had recommended an echocardiogram and carotid duplex due to carotid bruit and first-degree block on the EKG.  He now presents for follow-up.  He remains asymptomatic and continues to remain active around the house.  Past Medical History:  Diagnosis Date   Bilateral carotid artery stenosis without cerebral infarction    ASYMPTOMATIC--  BILATERAL ICA  50-69%  PER CARDIOLOGIST NOTE, DR Einar Gip   Bilateral carotid bruits    LEFT  soft bruit, right no no carotid bruit per 07-24-2014 dr Einar Gip note   CKD (chronic kidney disease)    dr foster [place] kidney, ckd stage 3 per lov 08-14-5001   Complication of anesthesia    1 episode atrial fib 02-09-2014 [facility] and followed up with cardiology, not further issue   Coronary artery disease    NON-OBSTRUCTIVE CAD  per cath 08-06-2004 HX PALPITATIONS-AND -TACHYCARDIA-   First degree heart block    Full dentures    GERD (gastroesophageal reflux disease)    Heart palpitations    Not any more   History of atrial fibrillation without current medication    EPISODE OF AFIB WITH RVR INTRAOPERATIVELY 02-09-2014 AT [facility]--  RESOLVED AND PT FOLLOWED UP WITH CARDIOLOGIST  DR Einar Gip   History of kidney stones    Hyperlipidemia    Hypertension    Nephrolithiasis    RIGHT   Organic impotence    Psoriasis    Clearing up    PSVT (paroxysmal supraventricular tachycardia) ([place])    Right ureteral calculus    S/P radiofrequency ablation operation for arrhythmia-SVT 04/28/2014   Simple renal cyst    right   Wears glasses    Wears hearing aid    BILATERAL   Past Surgical History:  Procedure Laterality Date   CARDIAC CATHETERIZATION  08-06-2004 dr Verlon Setting   (abnormal stress test) Non-obstructive CAD, pLAD 20-30%,  LCX 30-40%, pRCA 20-30%, dRCA 40%, distal LM  mild diffuse calcifation 20-30% tapering stenosis,  preserved LVSF ef 55-60%   CARPAL TUNNEL RELEASE Bilateral 2005   CATARACT EXTRACTION, BILATERAL Bilateral 2011   CHOLECYSTECTOMY OPEN  1982   and NEPHROLITHOTOMY   COLONOSCOPY W/ POLYPECTOMY  last one 04-05-2008   CYSTOSCOPY WITH LITHOLAPAXY Right 04/14/2017   Procedure: CYSTOSCOPY WITH LITHOLAPAXY/RIGHT RETROGRADE PLYOGRAM/RIGHT URETEROSCOPY;  Surgeon: [name], MD;  Location: [facility];  Service: Urology;  Laterality: Right;   CYSTOSCOPY WITH RETROGRADE PYELOGRAM, URETEROSCOPY AND STENT PLACEMENT Right 03/14/2014   Procedure: CYSTO/RIGHT RETROGRADE PYELOGRAM/RIGHT URETEROSCOPY/STONE EXTRACTION/STENT PLACEMENT;  Surgeon: Malka So, MD;  Location: [facility];  Service: Urology;  Laterality: Right;   CYSTOSCOPY WITH RETROGRADE PYELOGRAM, URETEROSCOPY AND STENT PLACEMENT Left 04/19/2019   Procedure: CYSTOSCOPY WITH LEFT  RETROGRADE LEFT , URETEROSCOPY WITH HOLMIUM LASER, BASKET EXTRACTION AND STENT PLACEMENT;  Surgeon: Jeffie Pollock,  Jenny Reichmann, MD;  Location: [facility];  Service: Urology;  Laterality: Left;   CYSTOSCOPY WITH RETROGRADE PYELOGRAM, URETEROSCOPY AND STENT PLACEMENT Left 03/30/2020   Procedure: CYSTOSCOPY WITH RETROGRADE PYELOGRAM, URETEROSCOPY, BASKETTING OF STONE AND STENT PLACEMENT;  Surgeon: Alexis Frock, MD;  Location: [facility];  Service: Urology;  Laterality: Left;   EYE SURGERY Bilateral    Cataracts removed   HOLMIUM LASER APPLICATION Right 07/15/2200    Procedure: RIGHT HOLMIUM LASER APPLICATION;  Surgeon: Malka So, MD;  Location: [facility];  Service: Urology;  Laterality: Right;   KNEE ARTHROSCOPY W/ DEBRIDEMENT Right 11-23-2002   and Synovectomy   LUMBAR LAMINECTOMY/DECOMPRESSION MICRODISCECTOMY N/A 07/15/2018   Procedure: Posterior lumbar decompression and fusion L4-5;  Surgeon: Melina Schools, MD;  Location: [place];  Service: Orthopedics;  Laterality: N/A;  4 hrs   NEPHROLITHOTOMY  01/16/2011   Procedure: NEPHROLITHOTOMY PERCUTANEOUS;  Surgeon: Malka So;  Location: [facility];  Service: Urology;  Laterality: Left;  C-Arm  Holmium Laser   NEPHROLITHOTOMY  1981   SUPRAVENTRICULAR TACHYCARDIA ABLATION N/A 04/28/2014   Procedure: SUPRAVENTRICULAR TACHYCARDIA ABLATION;  Surgeon: Evans Lance, MD;  Location: [facility];  Service: Cardiovascular;  Laterality: N/A;   Family History  Problem Relation Age of Onset   Cancer Mother        Ovarian or colon   Heart attack Father    Heart attack Brother     Social History   Tobacco Use   Smoking status: Former    Packs/day: 1.00    Years: 40.00    Pack years: 40.00    Types: Cigarettes    Quit date: 02/06/1998    Years since quitting: 22.4   Smokeless tobacco: Never  Substance Use Topics   Alcohol use: No   Marital Status: Widowed  ROS  Review of Systems  HENT:  Positive for hearing loss.   Cardiovascular:  Negative for chest pain, dyspnea on exertion and leg swelling.  Gastrointestinal:  Negative for melena.  Objective  Blood pressure 137/89, pulse 81, temperature 98.3 F (36.8 C), temperature source Temporal, resp. rate 16, height 5' 10"  (1.778 m), weight 183 lb 3.2 oz (83.1 kg), SpO2 98 %. Body mass index is 26.29 kg/m.   Vitals with BMI 07/20/2020 06/06/2020 03/30/2020  Height 5' 10"  5' 10"  -  Weight 183 lbs 3 oz 184 lbs 13 oz -  BMI 54.27 06.23 -  Systolic 762 831 517  Diastolic 89 54 78  Pulse 81 79 56     Physical Exam HENT:     Head: Atraumatic.  Neck:      Vascular: Carotid bruit (Bilateral) present. No JVD.  Cardiovascular:     Rate and Rhythm: Normal rate and regular rhythm.     Pulses: Normal pulses and intact distal pulses.     Heart sounds: S1 normal and S2 normal. Murmur heard.  Crescendo systolic murmur is present with a grade of 2/6 at the upper right sternal border.  Early diastolic murmur is present with a grade of 2/4 at the upper right sternal border.    No gallop.  Pulmonary:     Effort: Pulmonary effort is normal.     Breath sounds: Normal breath sounds.  Abdominal:     General: Bowel sounds are normal.     Palpations: Abdomen is soft.   Laboratory examination:   Recent Labs    07/23/19 1854 07/27/19 1054 03/30/20 1134  NA 135 136 142  K  3.8 4.3 3.7  CL 96* 100 103  CO2 28 26  --   GLUCOSE 119* 149* 117*  BUN 16 15 23   CREATININE 1.37* 1.37* 1.60*  CALCIUM 8.8* 8.7*  --   GFRNONAA 47* 47*  --   GFRAA 55* 55*  --    CrCl cannot be calculated (Patient's most recent lab result is older than the maximum 21 days allowed.).  CMP Latest Ref Rng & Units 03/30/2020 07/27/2019 07/23/2019  Glucose 70 - 99 mg/dL 117(H) 149(H) 119(H)  BUN 8 - 23 mg/dL 23 15 16   Creatinine 0.61 - 1.24 mg/dL 1.60(H) 1.37(H) 1.37(H)  Sodium 135 - 145 mmol/L 142 136 135  Potassium 3.5 - 5.1 mmol/L 3.7 4.3 3.8  Chloride 98 - 111 mmol/L 103 100 96(L)  CO2 22 - 32 mmol/L - 26 28  Calcium 8.9 - 10.3 mg/dL - 8.7(L) 8.8(L)  Total Protein 6.5 - 8.1 g/dL - - -  Total Bilirubin 0.3 - 1.2 mg/dL - - -  Alkaline Phos 38 - 126 U/L - - -  AST 15 - 41 U/L - - -  ALT 0 - 44 U/L - - -   CBC Latest Ref Rng & Units 03/30/2020 07/27/2019 07/23/2019  WBC 4.0 - 10.5 K/uL - 6.4 7.8  Hemoglobin 13.0 - 17.0 g/dL 10.9(L) 10.9(L) 10.9(L)  Hematocrit 39.0 - 52.0 % 32.0(L) 34.0(L) 32.9(L)  Platelets 150 - 400 K/uL - 234 190    No results for input(s): TSH in the last 8760 hours.   External labs:   Lab 05/12/2020:  TSH mildly elevated at 7.140.  Vitamin  B12 866.  Hb 10.7/HCT 33.6, platelets 143 (140-400).  Microcytic indicis.  Serum glucose 104 mg, BUN 17, creatinine 1.47, EGFR 46 mL, potassium 4.6, CMP otherwise normal.  Total cholesterol 121, triglycerides 63, HDL 47, LDL 61.  Medications and allergies   Allergies  Allergen Reactions   Bupropion     Other reaction(s): disoriented   Olmesartan     Other reaction(s): rash   Trazodone     Other reaction(s): unsteady gait     Outpatient Medications Prior to Visit  Medication Sig Dispense Refill   amLODipine (NORVASC) 10 MG tablet Take 10 mg by mouth daily.      Ascorbic Acid (VITAMIN C PO) Take by mouth daily.     atorvastatin (LIPITOR) 20 MG tablet Take 20 mg by mouth daily.      Cholecalciferol (VITAMIN D-3) 25 MCG (1000 UT) CAPS Take 1,000 Units by mouth daily with breakfast.     losartan (COZAAR) 50 MG tablet Take 1 tablet by mouth daily.     Melatonin 1 MG CAPS Take 1 capsule by mouth at bedtime.     pantoprazole (PROTONIX) 40 MG tablet Take 40 mg by mouth in the morning and at bedtime.      tamsulosin (FLOMAX) 0.4 MG CAPS capsule Take 0.4 mg by mouth daily after supper.     traMADol (ULTRAM) 50 MG tablet Take 1 tablet (50 mg total) by mouth every 6 (six) hours as needed for moderate pain or severe pain. Post-operatively 15 tablet 0   zinc gluconate 50 MG tablet Take 50 mg by mouth daily.     levocetirizine (XYZAL) 5 MG tablet Take 1 tablet by mouth daily.     No facility-administered medications prior to visit.    Medications as of current encounter Current Meds  Medication Sig   amLODipine (NORVASC) 10 MG tablet Take 10 mg by mouth daily.  Ascorbic Acid (VITAMIN C PO) Take by mouth daily.   aspirin (ASPIRIN CHILDRENS) 81 MG chewable tablet Chew 1 tablet (81 mg total) by mouth daily.   atorvastatin (LIPITOR) 20 MG tablet Take 20 mg by mouth daily.    Cholecalciferol (VITAMIN D-3) 25 MCG (1000 UT) CAPS Take 1,000 Units by mouth daily with breakfast.   losartan  (COZAAR) 50 MG tablet Take 1 tablet by mouth daily.   Melatonin 1 MG CAPS Take 1 capsule by mouth at bedtime.   pantoprazole (PROTONIX) 40 MG tablet Take 40 mg by mouth in the morning and at bedtime.    tamsulosin (FLOMAX) 0.4 MG CAPS capsule Take 0.4 mg by mouth daily after supper.   traMADol (ULTRAM) 50 MG tablet Take 1 tablet (50 mg total) by mouth every 6 (six) hours as needed for moderate pain or severe pain. Post-operatively   zinc gluconate 50 MG tablet Take 50 mg by mouth daily.     Radiology:   No results found.  Cardiac Studies:   Echocardiogram 06/19/2020: Normal LV systolic function with visual EF 55-60%. Left ventricle cavity is normal in size. Moderate left ventricular hypertrophy. Normal global wall motion. Doppler evidence of grade II (pseudonormal) diastolic dysfunction, elevated LAP. Left atrial cavity is severely dilated. Mild (Grade I) aortic regurgitation. Mild (Grade I) mitral regurgitation. Mild tricuspid regurgitation. Mild pulmonary hypertension. RVSP measures 42 mmHg. IVC is dilated with a respiratory response of <50%. No prior study for comparison.  Carotid artery duplex 06/19/2020: Stenosis in the right internal carotid artery (1-15%). Stenosis in the right carotid bulb of <50%. Stenosis in the right external carotid artery (<50%). Stenosis in the left internal carotid artery (16-49%). Stenosis in the left external carotid artery (<50%). There is diffuse heterogeneous plaque in the bilateral carotid arteries. Antegrade right vertebral artery flow. Antegrade left vertebral artery flow. Compared to 11/02/2014, there was minimal plaque bilaterally. Follow up in one year is appropriate if clinically indicated.  EKG:   EKG 06/06/2020: Sinus rhythm with first-degree AV block (400 ms), left axis deviation, left anterior fascicular block, incomplete right bundle branch block.  Low-voltage complexes.  No significant change from EKG 05/10/2020: Sinus rhythm with  first-degree AV block at rate of 71 bpm, inferior infarct old.  Low-voltage complexes.  No evidence of ischemia.  Assessment     ICD-10-CM   1. 1st degree AV block  I44.0     2. Hypercholesteremia  E78.00     3. Carotid atherosclerosis, bilateral  I65.23 aspirin (ASPIRIN CHILDRENS) 81 MG chewable tablet    4. Primary hypertension  I10       Medications Discontinued During This Encounter  Medication Reason   levocetirizine (XYZAL) 5 MG tablet Error    Meds ordered this encounter  Medications   aspirin (ASPIRIN CHILDRENS) 81 MG chewable tablet    Sig: Chew 1 tablet (81 mg total) by mouth daily.    No orders of the defined types were placed in this encounter.  Recommendations:   Brandon Fischer is a 87 y.o. Caucasian male with hypertension, hyperlipidemia, SVT ablation in 2016, stage IIIa chronic kidney disease, first-degree AV block by EKG, bilateral carotid bruit, referred to me due to abnormal EKG.  I used to take care of his wife Mrs. Tula Nakayama, who passed away a year ago and patient is still grieving.  Since then he has noticed fatigue.  I had seen him on 06/06/2020 for abnormal EKG revealing first-degree AV block and had recommended an echocardiogram and  carotid duplex due to carotid bruit and first-degree block on the EKG.  He now presents for follow-up.  He remains asymptomatic and continues to remain active around the house.  No dizziness or syncope.  No change in physical exam, I reviewed the results of the echocardiogram and also the carotid artery duplex.  He will need carotid artery surveillance with repeat duplex in 1 year.  In view of mild carotid atherosclerosis and asymptomatic left carotid stenosis, I started him on aspirin 81 mg daily.  Blood pressure is well controlled, lipids are also under excellent control.  I encouraged him to continue to remain active.  I will see him back in a year.   Adrian Prows, MD, [facility] 07/20/2020, 11:10 AM Office: [phone]

## 2020-08-06 DIAGNOSIS — H5310 Unspecified subjective visual disturbances: Secondary | ICD-10-CM | POA: Diagnosis not present

## 2020-08-09 DIAGNOSIS — R634 Abnormal weight loss: Secondary | ICD-10-CM | POA: Diagnosis not present

## 2020-08-09 DIAGNOSIS — R7989 Other specified abnormal findings of blood chemistry: Secondary | ICD-10-CM | POA: Diagnosis not present

## 2020-08-16 DIAGNOSIS — D509 Iron deficiency anemia, unspecified: Secondary | ICD-10-CM | POA: Diagnosis not present

## 2020-10-01 DIAGNOSIS — R7989 Other specified abnormal findings of blood chemistry: Secondary | ICD-10-CM | POA: Diagnosis not present

## 2020-10-09 DIAGNOSIS — Z6826 Body mass index (BMI) 26.0-26.9, adult: Secondary | ICD-10-CM | POA: Diagnosis not present

## 2020-10-09 DIAGNOSIS — I6523 Occlusion and stenosis of bilateral carotid arteries: Secondary | ICD-10-CM | POA: Diagnosis not present

## 2020-10-09 DIAGNOSIS — I471 Supraventricular tachycardia: Secondary | ICD-10-CM | POA: Diagnosis not present

## 2020-10-09 DIAGNOSIS — R7989 Other specified abnormal findings of blood chemistry: Secondary | ICD-10-CM | POA: Diagnosis not present

## 2020-10-09 DIAGNOSIS — I2510 Atherosclerotic heart disease of native coronary artery without angina pectoris: Secondary | ICD-10-CM | POA: Diagnosis not present

## 2020-10-11 DIAGNOSIS — I10 Essential (primary) hypertension: Secondary | ICD-10-CM | POA: Diagnosis not present

## 2020-10-11 DIAGNOSIS — R634 Abnormal weight loss: Secondary | ICD-10-CM | POA: Diagnosis not present

## 2020-10-11 DIAGNOSIS — Z23 Encounter for immunization: Secondary | ICD-10-CM | POA: Diagnosis not present

## 2020-12-11 DIAGNOSIS — R1310 Dysphagia, unspecified: Secondary | ICD-10-CM | POA: Diagnosis not present

## 2020-12-11 DIAGNOSIS — D649 Anemia, unspecified: Secondary | ICD-10-CM | POA: Diagnosis not present

## 2020-12-11 DIAGNOSIS — K219 Gastro-esophageal reflux disease without esophagitis: Secondary | ICD-10-CM | POA: Diagnosis not present

## 2020-12-12 DIAGNOSIS — K219 Gastro-esophageal reflux disease without esophagitis: Secondary | ICD-10-CM | POA: Diagnosis not present

## 2020-12-12 DIAGNOSIS — E7800 Pure hypercholesterolemia, unspecified: Secondary | ICD-10-CM | POA: Diagnosis not present

## 2020-12-12 DIAGNOSIS — M1990 Unspecified osteoarthritis, unspecified site: Secondary | ICD-10-CM | POA: Diagnosis not present

## 2020-12-12 DIAGNOSIS — N400 Enlarged prostate without lower urinary tract symptoms: Secondary | ICD-10-CM | POA: Diagnosis not present

## 2020-12-17 DIAGNOSIS — R0602 Shortness of breath: Secondary | ICD-10-CM | POA: Diagnosis not present

## 2020-12-17 DIAGNOSIS — K219 Gastro-esophageal reflux disease without esophagitis: Secondary | ICD-10-CM | POA: Diagnosis not present

## 2020-12-20 DIAGNOSIS — F4321 Adjustment disorder with depressed mood: Secondary | ICD-10-CM | POA: Diagnosis not present

## 2020-12-20 DIAGNOSIS — K219 Gastro-esophageal reflux disease without esophagitis: Secondary | ICD-10-CM | POA: Diagnosis not present

## 2020-12-23 DIAGNOSIS — Z20822 Contact with and (suspected) exposure to covid-19: Secondary | ICD-10-CM | POA: Diagnosis not present

## 2020-12-24 DIAGNOSIS — Z20822 Contact with and (suspected) exposure to covid-19: Secondary | ICD-10-CM | POA: Diagnosis not present

## 2021-01-04 DIAGNOSIS — M25552 Pain in left hip: Secondary | ICD-10-CM | POA: Diagnosis not present

## 2021-01-09 ENCOUNTER — Other Ambulatory Visit: Payer: Self-pay

## 2021-01-09 ENCOUNTER — Ambulatory Visit: Payer: Self-pay

## 2021-01-09 ENCOUNTER — Ambulatory Visit: Payer: Medicare Other

## 2021-01-09 ENCOUNTER — Ambulatory Visit (INDEPENDENT_AMBULATORY_CARE_PROVIDER_SITE_OTHER): Payer: Medicare Other | Admitting: Family Medicine

## 2021-01-09 VITALS — BP 150/88 | HR 80 | Ht 70.0 in | Wt 180.4 lb

## 2021-01-09 DIAGNOSIS — I6523 Occlusion and stenosis of bilateral carotid arteries: Secondary | ICD-10-CM

## 2021-01-09 DIAGNOSIS — M25552 Pain in left hip: Secondary | ICD-10-CM | POA: Diagnosis not present

## 2021-01-09 DIAGNOSIS — G8929 Other chronic pain: Secondary | ICD-10-CM

## 2021-01-09 NOTE — Progress Notes (Signed)
Subjective:    CC: L hip and low back pain  I, Molly Weber, LAT, ATC, am serving as scribe for Dr. Lynne Leader.  HPI: Pt is an 87 y/o male presenting w/ L hip and low back pain x approximately 3 weeks w/ no known MOI.  He locates his pain to the lateral aspect of his L hip and deep within the hip joint.  He has previously seen Dr. Rolena Infante in July 2020 and had an L4-5 lumbar decompression and fusion. Pt c/o pain waking him up at night, starting last night.   Low back pain: no Radiating pain: no LE numbness/tingling: no Aggravating factors: walking, transitioning to stand Treatments tried: Tylenol, Tramadol  Diagnostic imaging: L-spine XR- 07/15/18  Pertinent review of Systems: No fevers or chills  Relevant historical information: Heart disease.  Spinal stenosis.  Hypertension.   Objective:    Vitals:   01/09/21 1433  BP: (!) 150/88  Pulse: 80  SpO2: 99%   General: Well Developed, well nourished, and in no acute distress.   MSK: Left hip decreased muscle bulk otherwise normal-appearing Tender palpation greater trochanter. Decreased range of motion of flexion and internal rotation. Hip abduction strength significantly diminished 3/5.  External rotation strength diminished 3/5.    Lab and Radiology Results  Procedure: Real-time Ultrasound Guided Injection of left hip greater trochanter bursa Device: Philips Affiniti 50G Images permanently stored and available for review in PACS Verbal informed consent obtained.  Discussed risks and benefits of procedure. Warned about infection bleeding damage to structures skin hypopigmentation and fat atrophy among others. Patient expresses understanding and agreement Time-out conducted.   Noted no overlying erythema, induration, or other signs of local infection.   Skin prepped in a sterile fashion.   Local anesthesia: Topical Ethyl chloride.   With sterile technique and under real time ultrasound guidance: Milligrams of Kenalog  and 2 mL of Marcaine injected into greater trochanter bursa left hip. Fluid seen entering the bursa.   Completed without difficulty   Pain immediately resolved suggesting accurate placement of the medication.   Advised to call if fevers/chills, erythema, induration, drainage, or persistent bleeding.   Images permanently stored and available for review in the ultrasound unit.  Impression: Technically successful ultrasound guided injection.        Impression and Recommendations:    Assessment and Plan: 87 y.o. male with left lateral hip pain thought to be due to greater trochanteric bursitis/hip abductor tendinopathy due to weakness.  Pain only started a few weeks ago without fall or injury.  Plan for trial of physical therapy and injection.  Check back in about 6 weeks.  Return sooner if needed.[name] He lives in [place] and still drives a little bit.  He thinks he can make it to [facility] so we will use [facility] PT.  PDMP not reviewed this encounter. Orders Placed This Encounter  Procedures   [place] LIMITED JOINT SPACE STRUCTURES LOW LEFT(NO LINKED CHARGES)    Standing Status:   Future    Number of Occurrences:   1    Standing Expiration Date:   07/10/2021    Order Specific Question:   Reason for Exam (SYMPTOM  OR DIAGNOSIS REQUIRED)    Answer:   left hip pain    Order Specific Question:   Preferred imaging location?    Answer:   [place]   Ambulatory referral to Physical Therapy    Referral Priority:   Routine    Referral Type:  Physical Medicine    Referral Reason:   Specialty Services Required    Requested Specialty:   Physical Therapy    Number of Visits Requested:   1   No orders of the defined types were placed in this encounter.   Discussed warning signs or symptoms. Please see discharge instructions. Patient expresses understanding.   The above documentation has been reviewed and is accurate and complete Lynne Leader, M.D.

## 2021-01-09 NOTE — Patient Instructions (Addendum)
Thank you for coming in today.   You received a steroid injection today. Seek immediate medical attention if the joint becomes red, extremely painful, or is oozing fluid.   I've referred you to Physical Therapy.  Let us know if you don't hear from them in one week.   Recheck back in 8 weeks.

## 2021-01-11 ENCOUNTER — Ambulatory Visit: Payer: Medicare Other | Admitting: Family Medicine

## 2021-01-17 DIAGNOSIS — H5310 Unspecified subjective visual disturbances: Secondary | ICD-10-CM | POA: Diagnosis not present

## 2021-01-18 DIAGNOSIS — R262 Difficulty in walking, not elsewhere classified: Secondary | ICD-10-CM | POA: Diagnosis not present

## 2021-01-18 DIAGNOSIS — M25552 Pain in left hip: Secondary | ICD-10-CM | POA: Diagnosis not present

## 2021-03-08 ENCOUNTER — Encounter: Payer: Self-pay | Admitting: Family Medicine

## 2021-03-08 ENCOUNTER — Ambulatory Visit (INDEPENDENT_AMBULATORY_CARE_PROVIDER_SITE_OTHER): Payer: Medicare Other | Admitting: Family Medicine

## 2021-03-08 ENCOUNTER — Other Ambulatory Visit: Payer: Self-pay

## 2021-03-08 VITALS — BP 138/62 | HR 74 | Ht 70.0 in | Wt 179.8 lb

## 2021-03-08 DIAGNOSIS — G8929 Other chronic pain: Secondary | ICD-10-CM | POA: Diagnosis not present

## 2021-03-08 DIAGNOSIS — M25552 Pain in left hip: Secondary | ICD-10-CM

## 2021-03-08 NOTE — Progress Notes (Signed)
° °  I, Brandon Fischer, LAT, ATC, am serving as scribe for Dr. Lynne Leader.  Brandon Fischer is a 88 y.o. male who presents to [place] at [facility] today for f/u L hip pain thought to be due to greater trochanteric bursitis/hip abductor tendinopathy . Pt was last seen by Dr. Georgina Snell on 01/09/21 and was given a L GT steroid injection and was referred to [facility] PT. Today, pt reports that his L hip pain has resolved.  He went to one PT session and they d/c him.  Dx imaging: 07/15/18 L-spine XR    Pertinent review of systems: No fevers or chills  Relevant historical information: SVT.  Spinal stenosis.  Dyslipidemia.   Exam:  BP 138/62 (BP Location: Left Arm, Patient Position: Sitting, Cuff Size: Normal)    Pulse 74    Ht 5\' 10"  (1.778 m)    Wt 179 lb 12.8 oz (81.6 kg)    SpO2 98%    BMI 25.80 kg/m  General: Well Developed, well nourished, and in no acute distress.   MSK: Left hip normal-appearing. Mild antalgic gait using a cane to ambulate.     Assessment and Plan: 88 y.o. male with left hip pain thought to be due to trochanteric bursitis.  He did great with a cortisone shot and effectively has not needed any further treatment over the last 2 months.  Fundamentally I think we should basically get out of Rossi's way and check back with him as needed.  Do think if the pain starts creeping back physical therapy would be a good idea but for now watchful waiting.    Discussed warning signs or symptoms. Please see discharge instructions. Patient expresses understanding.   The above documentation has been reviewed and is accurate and complete Lynne Leader, M.D.

## 2021-03-08 NOTE — Patient Instructions (Signed)
Good to see you today.  Follow-up as needed.  I can repeat the hip injection as frequently as every 3 months if needed.

## 2021-03-12 DIAGNOSIS — K219 Gastro-esophageal reflux disease without esophagitis: Secondary | ICD-10-CM | POA: Diagnosis not present

## 2021-03-12 DIAGNOSIS — M1990 Unspecified osteoarthritis, unspecified site: Secondary | ICD-10-CM | POA: Diagnosis not present

## 2021-03-12 DIAGNOSIS — N400 Enlarged prostate without lower urinary tract symptoms: Secondary | ICD-10-CM | POA: Diagnosis not present

## 2021-03-12 DIAGNOSIS — E7800 Pure hypercholesterolemia, unspecified: Secondary | ICD-10-CM | POA: Diagnosis not present

## 2021-04-07 ENCOUNTER — Emergency Department (HOSPITAL_COMMUNITY): Payer: Medicare Other

## 2021-04-07 ENCOUNTER — Telehealth: Payer: Self-pay | Admitting: Cardiology

## 2021-04-07 ENCOUNTER — Emergency Department (HOSPITAL_COMMUNITY)
Admission: EM | Admit: 2021-04-07 | Discharge: 2021-04-07 | Disposition: A | Discharge status: Home / Self Care | Payer: Medicare Other | Attending: Emergency Medicine | Admitting: Emergency Medicine

## 2021-04-07 DIAGNOSIS — S0993XA Unspecified injury of face, initial encounter: Secondary | ICD-10-CM | POA: Diagnosis not present

## 2021-04-07 DIAGNOSIS — G4700 Insomnia, unspecified: Secondary | ICD-10-CM | POA: Insufficient documentation

## 2021-04-07 DIAGNOSIS — S51012A Laceration without foreign body of left elbow, initial encounter: Secondary | ICD-10-CM | POA: Diagnosis not present

## 2021-04-07 DIAGNOSIS — M25511 Pain in right shoulder: Secondary | ICD-10-CM | POA: Insufficient documentation

## 2021-04-07 DIAGNOSIS — E876 Hypokalemia: Secondary | ICD-10-CM | POA: Diagnosis not present

## 2021-04-07 DIAGNOSIS — R42 Dizziness and giddiness: Secondary | ICD-10-CM | POA: Insufficient documentation

## 2021-04-07 DIAGNOSIS — Z043 Encounter for examination and observation following other accident: Secondary | ICD-10-CM | POA: Diagnosis not present

## 2021-04-07 DIAGNOSIS — S01111A Laceration without foreign body of right eyelid and periocular area, initial encounter: Secondary | ICD-10-CM | POA: Insufficient documentation

## 2021-04-07 DIAGNOSIS — H11421 Conjunctival edema, right eye: Secondary | ICD-10-CM | POA: Insufficient documentation

## 2021-04-07 DIAGNOSIS — Z23 Encounter for immunization: Secondary | ICD-10-CM | POA: Insufficient documentation

## 2021-04-07 DIAGNOSIS — S5002XA Contusion of left elbow, initial encounter: Secondary | ICD-10-CM | POA: Insufficient documentation

## 2021-04-07 DIAGNOSIS — S02831A Fracture of medial orbital wall, right side, initial encounter for closed fracture: Secondary | ICD-10-CM | POA: Diagnosis not present

## 2021-04-07 DIAGNOSIS — S80212A Abrasion, left knee, initial encounter: Secondary | ICD-10-CM | POA: Insufficient documentation

## 2021-04-07 DIAGNOSIS — S61411A Laceration without foreign body of right hand, initial encounter: Secondary | ICD-10-CM | POA: Diagnosis not present

## 2021-04-07 DIAGNOSIS — H1131 Conjunctival hemorrhage, right eye: Secondary | ICD-10-CM | POA: Diagnosis not present

## 2021-04-07 DIAGNOSIS — S0181XA Laceration without foreign body of other part of head, initial encounter: Secondary | ICD-10-CM | POA: Diagnosis present

## 2021-04-07 DIAGNOSIS — W19XXXA Unspecified fall, initial encounter: Secondary | ICD-10-CM

## 2021-04-07 DIAGNOSIS — S0285XB Fracture of orbit, unspecified, initial encounter for open fracture: Secondary | ICD-10-CM

## 2021-04-07 DIAGNOSIS — W01198A Fall on same level from slipping, tripping and stumbling with subsequent striking against other object, initial encounter: Secondary | ICD-10-CM | POA: Insufficient documentation

## 2021-04-07 DIAGNOSIS — R58 Hemorrhage, not elsewhere classified: Secondary | ICD-10-CM | POA: Diagnosis not present

## 2021-04-07 DIAGNOSIS — S199XXA Unspecified injury of neck, initial encounter: Secondary | ICD-10-CM | POA: Diagnosis not present

## 2021-04-07 DIAGNOSIS — Z961 Presence of intraocular lens: Secondary | ICD-10-CM | POA: Diagnosis not present

## 2021-04-07 DIAGNOSIS — D72829 Elevated white blood cell count, unspecified: Secondary | ICD-10-CM | POA: Diagnosis not present

## 2021-04-07 DIAGNOSIS — Z7982 Long term (current) use of aspirin: Secondary | ICD-10-CM | POA: Diagnosis not present

## 2021-04-07 DIAGNOSIS — Y92009 Unspecified place in unspecified non-institutional (private) residence as the place of occurrence of the external cause: Secondary | ICD-10-CM | POA: Insufficient documentation

## 2021-04-07 DIAGNOSIS — S81012A Laceration without foreign body, left knee, initial encounter: Secondary | ICD-10-CM | POA: Diagnosis not present

## 2021-04-07 LAB — CBC WITH DIFFERENTIAL/PLATELET
Abs Immature Granulocytes: 0.04 10*3/uL (ref 0.00–0.07)
Basophils Absolute: 0 10*3/uL (ref 0.0–0.1)
Basophils Relative: 0 %
Eosinophils Absolute: 0 10*3/uL (ref 0.0–0.5)
Eosinophils Relative: 0 %
HCT: 30 % — ABNORMAL LOW (ref 39.0–52.0)
Hemoglobin: 9.6 g/dL — ABNORMAL LOW (ref 13.0–17.0)
Immature Granulocytes: 0 %
Lymphocytes Relative: 3 %
Lymphs Abs: 0.4 10*3/uL — ABNORMAL LOW (ref 0.7–4.0)
MCH: 25.6 pg — ABNORMAL LOW (ref 26.0–34.0)
MCHC: 32 g/dL (ref 30.0–36.0)
MCV: 80 fL (ref 80.0–100.0)
Monocytes Absolute: 0.8 10*3/uL (ref 0.1–1.0)
Monocytes Relative: 7 %
Neutro Abs: 10.1 10*3/uL — ABNORMAL HIGH (ref 1.7–7.7)
Neutrophils Relative %: 90 %
Platelets: 170 10*3/uL (ref 150–400)
RBC: 3.75 MIL/uL — ABNORMAL LOW (ref 4.22–5.81)
RDW: 15.3 % (ref 11.5–15.5)
WBC: 11.3 10*3/uL — ABNORMAL HIGH (ref 4.0–10.5)
nRBC: 0 % (ref 0.0–0.2)

## 2021-04-07 LAB — TROPONIN I (HIGH SENSITIVITY): Troponin I (High Sensitivity): 16 ng/L (ref <18)

## 2021-04-07 LAB — BASIC METABOLIC PANEL
Anion gap: 7 (ref 5–15)
BUN: 24 mg/dL — ABNORMAL HIGH (ref 8–23)
CO2: 24 mmol/L (ref 22–32)
Calcium: 8.4 mg/dL — ABNORMAL LOW (ref 8.9–10.3)
Chloride: 109 mmol/L (ref 98–111)
Creatinine, Ser: 1.45 mg/dL — ABNORMAL HIGH (ref 0.61–1.24)
GFR, Estimated: 47 mL/min — ABNORMAL LOW (ref >60)
Glucose, Bld: 135 mg/dL — ABNORMAL HIGH (ref 70–99)
Potassium: 3.4 mmol/L — ABNORMAL LOW (ref 3.5–5.1)
Sodium: 140 mmol/L (ref 135–145)

## 2021-04-07 LAB — TSH: TSH: 2.92 u[IU]/mL (ref 0.350–4.500)

## 2021-04-07 MED ORDER — LIDOCAINE HCL (PF) 1 % IJ SOLN
10.0000 mL | Freq: Once | INTRAMUSCULAR | Status: AC
  Administered 2021-04-07: 10 mL
  Filled 2021-04-07: qty 30

## 2021-04-07 MED ORDER — TETRACAINE HCL 0.5 % OP SOLN
2.0000 [drp] | Freq: Once | OPHTHALMIC | Status: AC
  Administered 2021-04-07: 2 [drp] via OPHTHALMIC
  Filled 2021-04-07: qty 4

## 2021-04-07 MED ORDER — CEPHALEXIN 500 MG PO CAPS: 500.0000 mg | ORAL_CAPSULE | Freq: Two times a day (BID) | ORAL | 0 refills | Status: AC

## 2021-04-07 MED ORDER — TETANUS-DIPHTH-ACELL PERTUSSIS 5-2.5-18.5 LF-MCG/0.5 IM SUSY
0.5000 mL | PREFILLED_SYRINGE | Freq: Once | INTRAMUSCULAR | Status: AC
  Administered 2021-04-07: 0.5 mL via INTRAMUSCULAR
  Filled 2021-04-07: qty 0.5

## 2021-04-07 MED ORDER — HYDRALAZINE HCL 10 MG PO TABS: 10.0000 mg | ORAL_TABLET | Freq: Three times a day (TID) | ORAL | 0 refills | Status: DC

## 2021-04-07 MED ORDER — FLUORESCEIN SODIUM 1 MG OP STRP
1.0000 | ORAL_STRIP | Freq: Once | OPHTHALMIC | Status: AC
Start: 2021-04-07 — End: 2021-04-07
  Administered 2021-04-07: 1 via OPHTHALMIC
  Filled 2021-04-07: qty 1

## 2021-04-07 NOTE — ED Provider Notes (Signed)
?[facility] DEPT ?Provider Note ? ? ?CSN: [id number] ?Arrival date & time: 04/07/21  6269 ? ?  ? ?History ? ?Chief Complaint  ?Patient presents with  ? Fall  ? ? ?Brandon Fischer is a 88 y.o. male who presents to the Emergency Department complaining of a fall onset PTA. Pt had a mechanical fall which caused him to hit his head on the counter.  Patient notes that he felt dizzy prior to the fall.  Per patient's son, patient was able to crawl to the chair to sit in a chair to call EMS while awaiting EMS arrival.  Per family who is at bedside, family notes that patient took a trazodone the night before to aid with his insomnia.  Pt has associated laceration above right eye and right shoulder pain. He hasn't tried any medications for his symptoms. Denies LOC, HA, vision changes, chest pain, shortness of breath, abdominal pain. Denies anticoagulant use.  ? ? ?The history is provided by the patient and a relative. No language interpreter was used.  ? ?  ? ?Home Medications ?Prior to Admission medications   ?Medication Sig Start Date End Date Taking? Authorizing Provider  ?amLODipine (NORVASC) 10 MG tablet Take 10 mg by mouth daily.  05/10/18   [provider]  ?Ascorbic Acid (VITAMIN C PO) Take by mouth daily.    [provider]  ?aspirin (ASPIRIN CHILDRENS) 81 MG chewable tablet Chew 1 tablet (81 mg total) by mouth daily. 07/20/20   Adrian Prows, MD  ?atorvastatin (LIPITOR) 20 MG tablet Take 20 mg by mouth daily.     [provider]  ?Cholecalciferol (VITAMIN D-3) 25 MCG (1000 UT) CAPS Take 1,000 Units by mouth daily with breakfast.    [provider]  ?losartan (COZAAR) 50 MG tablet Take 1 tablet by mouth daily. 04/23/20   [provider]  ?Melatonin 1 MG CAPS Take 1 capsule by mouth at bedtime.    [provider]  ?pantoprazole (PROTONIX) 40 MG tablet Take 40 mg by mouth in the morning and at bedtime.     [provider]  ?tamsulosin  (FLOMAX) 0.4 MG CAPS capsule Take 0.4 mg by mouth daily after supper.    [provider]  ?traMADol (ULTRAM) 50 MG tablet Take 1 tablet (50 mg total) by mouth every 6 (six) hours as needed for moderate pain or severe pain. Post-operatively 03/30/20   Alexis Frock, MD  ?zinc gluconate 50 MG tablet Take 50 mg by mouth daily.    [provider]  ?   ? ?Allergies    ?Bupropion, Olmesartan, and Trazodone   ? ?Review of Systems   ?Review of Systems  ?Eyes:  Negative for visual disturbance.  ?Respiratory:  Negative for shortness of breath.   ?Cardiovascular:  Negative for chest pain.  ?Gastrointestinal:  Negative for abdominal pain.  ?Neurological:  Positive for dizziness. Negative for syncope and headaches.  ?All other systems reviewed and are negative. ? ?Physical Exam ?Updated Vital Signs ?BP (!) 146/78 (BP Location: Right Arm)   Pulse 64   Temp 97.7 ?F (36.5 ?C) (Oral)   Resp 16   SpO2 100%  ?Physical Exam ?Vitals and nursing note reviewed.  ?Constitutional:   ?   General: He is not in acute distress. ?   Appearance: He is not diaphoretic.  ?HENT:  ?   Head: Normocephalic and atraumatic.  ?   Comments: 1 cm laceration noted through right eyebrow.  1.5 cm x 2 cm  L-shaped laceration noted to right eyelid.  Bruising noted to right eyelid.   ?   Right Ear: External ear normal.  ?   Left Ear: External ear normal.  ?   Mouth/Throat:  ?   Pharynx: No oropharyngeal exudate.  ?Eyes:  ?   General: Vision grossly intact. No scleral icterus. ?   Intraocular pressure: Right eye pressure is 23 mmHg. Measurements were taken using a handheld tonometer. ?   Extraocular Movements: Extraocular movements intact.  ?   Conjunctiva/sclera:  ?   Right eye: Chemosis and hemorrhage present.  ?   Left eye: No hemorrhage. ?   Pupils: Pupils are equal, round, and reactive to light.  ?   Right eye: No corneal abrasion or fluorescein uptake. Seidel exam negative.  ?   Comments: Chemosis and subconjunctival hemorrhage noted to  right eye.  Ecchymosis noted to right eyelid. PERRL.  EOMI. no fluorescein uptake noted.  Negative for corneal abrasion.  Negative Seidel exam.  ?Neck:  ?   Comments: C-collar in place ?Cardiovascular:  ?   Rate and Rhythm: Normal rate and regular rhythm.  ?   Pulses: Normal pulses.  ?   Heart sounds: Normal heart sounds.  ?Pulmonary:  ?   Effort: Pulmonary effort is normal. No respiratory distress.  ?   Breath sounds: Normal breath sounds. No wheezing.  ?Chest:  ?   Chest wall: No tenderness.  ?   Comments: No chest wall TTP.  ?Abdominal:  ?   General: Bowel sounds are normal.  ?   Palpations: Abdomen is soft. There is no mass.  ?   Tenderness: There is no abdominal tenderness. There is no guarding or rebound.  ?Musculoskeletal:     ?   General: Normal range of motion.  ?   Cervical back: Normal range of motion and neck supple.  ?   Comments: Skin tears noted to right MCPs without TTP. Ecchymosis noted to left elbow without TTP. Full ROM of left elbow. Abrasions noted to left knee without TTP. Full ROM of left knee. Pedal pulses intact.  Full active range of motion of right shoulder, no obvious deformity appreciated.  Strength and sensation intact to bilateral upper and lower extremities.   ?Skin: ?   General: Skin is warm and dry.  ?   Findings: Laceration present.  ?Neurological:  ?   Mental Status: He is alert.  ?Psychiatric:     ?   Behavior: Behavior normal.  ? ? ?ED Results / Procedures / Treatments   ?Labs ?(all labs ordered are listed, but only abnormal results are displayed) ?Labs Reviewed  ?BASIC METABOLIC PANEL - Abnormal; Notable for the following components:  ?    Result Value  ? Potassium 3.4 (*)   ? Glucose, Bld 135 (*)   ? BUN 24 (*)   ? Creatinine, Ser 1.45 (*)   ? Calcium 8.4 (*)   ? GFR, Estimated 47 (*)   ? All other components within normal limits  ?CBC WITH DIFFERENTIAL/PLATELET - Abnormal; Notable for the following components:  ? WBC 11.3 (*)   ? RBC 3.75 (*)   ? Hemoglobin 9.6 (*)   ? HCT  30.0 (*)   ? MCH 25.6 (*)   ? Neutro Abs 10.1 (*)   ? Lymphs Abs 0.4 (*)   ? All other components within normal limits  ?TSH  ?TROPONIN I (HIGH SENSITIVITY)  ?TROPONIN I (HIGH SENSITIVITY)  ? ? ?EKG ?None ? ?Radiology ?DG Chest 1  View ? ?Result Date: 04/07/2021 ?CLINICAL DATA:  Pt BIB EMS from home after a fall and hit his head on the counter. Pt had a 1 inch laceration over his right eye. Skin tears noted to right knuckles, right lateral hand, left lateral elbow, and left anterior knee. Pt complained of right shoulder pain. EXAM: CHEST  1 VIEW COMPARISON:  08/09/2020 FINDINGS: Cardiac silhouette is normal in size. No mediastinal or hilar masses. Clear lungs. No pleural effusion or pneumothorax. Skeletal structures are grossly intact. IMPRESSION: No active disease. Electronically Signed   By: Lajean Manes M.D.   On: 04/07/2021 08:36  ? ?DG Shoulder Right ? ?Result Date: 04/07/2021 ?CLINICAL DATA:  Pt BIB EMS from home after a fall and hit his head on the counter. Pt had a 1 inch laceration over his right eye. Skin tears noted to right knuckles, right lateral hand, left lateral elbow, and left anterior knee. Pt complained of right shoulder pain. EXAM: RIGHT SHOULDER - 2+ VIEW COMPARISON:  None. FINDINGS: No fracture or bone lesion. Glenohumeral and AC joints are normally aligned. Mild AC joint osteoarthritis. Visualized underlying ribs appear intact. Soft tissues are unremarkable. IMPRESSION: 1. No fracture, dislocation or acute finding. Electronically Signed   By: Lajean Manes M.D.   On: 04/07/2021 08:37  ? ?DG ELBOW COMPLETE LEFT (3+VIEW) ? ?Result Date: 04/07/2021 ?CLINICAL DATA:  Pt BIB EMS from home after a fall and hit his head on the counter. Pt had a 1 inch laceration over his right eye. Skin tears noted to right knuckles, right lateral hand, left lateral elbow, and left anterior knee. Pt complained of right shoulder pain. EXAM: LEFT ELBOW - COMPLETE 3+ VIEW COMPARISON:  None. FINDINGS: No fracture or bone  lesion. Elbow joint normally spaced and aligned.  No joint effusion. Medial soft tissue swelling.  No radiopaque foreign body. IMPRESSION: No fracture or dislocation. Electronically Signed   By: Lajean Manes

## 2021-04-07 NOTE — ED Triage Notes (Signed)
Pt BIBA from home for mechanical fall. No LOC, no thinners. Hit head on counter. 1in lac over R eye, bleeding controlled. 227m estimated loss. Denies pain. Arrives in c-collar. ? ?112 palp ?HR 70 RR 18 ?96% RA ?CBG 195 ?

## 2021-04-07 NOTE — ED Notes (Signed)
Patient transported to CT 

## 2021-04-07 NOTE — Discharge Instructions (Addendum)
It was a pleasure taking care of you today!  ? ?Your CT scan was notable for medial orbital fracture, otherwise there were no other acute findings on your CT scans or x-rays.  You will be sent a prescription for Keflex, and sure to take the entire prescription as prescribed. ? ?You have an appointment with the ophthalmologist, Dr. Talbert Forest today at 1 PM.  His office address is attached to this information.  Ensure to call his cell phone number when you get there so that way he can open the door. ? ?You may follow-up with your primary care provider as needed.  Return to the emergency department if you are experiencing increasing/worsening headache, vision changes, worsening symptoms. ?

## 2021-04-07 NOTE — ED Triage Notes (Signed)
Pt reports feeling dizzy before falling. No previous episodes of dizziness. ?

## 2021-04-07 NOTE — Telephone Encounter (Signed)
ON-CALL CARDIOLOGY ?04/07/21 ? ?Patient's name: Brandon Fischer.   ?MRN: [id number].    ?DOB: [birth year] ?Primary care provider: Deland Pretty, MD. ?Primary cardiologist: Dr. Adrian Prows ? ?Interaction regarding this patient's care today: ?Patient presents to the hospital status post fall.  ED provider paged to review the EKGs performed at [facility] long. ? ?Review of electronic medical record notes that he does have normal sinus rhythm with first-degree AV block and intermittent second-degree type I AV block. ? ?The clinical question was if the fall today was due to underlying conduction disease. ? ?EKGs independently reviewed.  Underlying rhythm is sinus with second-degree type I AV block.  Patient has had similar EKGs back in 2020. ? ?Patient denies near-syncope or syncopal event.  Confounding factors include questionable mechanical fall, takes trazodone and amlodipine. ? ?Recommend checking orthostatic vital signs, TSH, CBC, BMP. ? ?Given the history provided by the ED physician low likelihood that today's event was due to cardiac arrhythmia.  However, he does have a substrate for progressive conduction disease.  I recommended that if patient agrees to be admitted given his recent fall cardiology will follow and will monitor him on telemetry.  ? ?ED provider reached out saying that the patient would like to go home. ? ?We will schedule an outpatient follow-up visit with primary cardiologist Dr. Einar Gip for further evaluation and management. ? ?Rex Kras, DO, FACC ? ?Pager: [phone] ?Office: [phone] ? ?

## 2021-04-07 NOTE — ED Notes (Addendum)
Skin tears noted to right knuckles, right lateral hand, left lateral elbow, and left knee. Bleeding controlled. ?

## 2021-04-12 DIAGNOSIS — Z20822 Contact with and (suspected) exposure to covid-19: Secondary | ICD-10-CM | POA: Diagnosis not present

## 2021-04-15 DIAGNOSIS — D649 Anemia, unspecified: Secondary | ICD-10-CM | POA: Diagnosis not present

## 2021-04-15 DIAGNOSIS — S4991XA Unspecified injury of right shoulder and upper arm, initial encounter: Secondary | ICD-10-CM | POA: Diagnosis not present

## 2021-04-15 DIAGNOSIS — S5001XA Contusion of right elbow, initial encounter: Secondary | ICD-10-CM | POA: Diagnosis not present

## 2021-04-15 DIAGNOSIS — E876 Hypokalemia: Secondary | ICD-10-CM | POA: Diagnosis not present

## 2021-04-15 DIAGNOSIS — Z9181 History of falling: Secondary | ICD-10-CM | POA: Diagnosis not present

## 2021-04-16 DIAGNOSIS — I10 Essential (primary) hypertension: Secondary | ICD-10-CM | POA: Diagnosis not present

## 2021-04-16 DIAGNOSIS — M25511 Pain in right shoulder: Secondary | ICD-10-CM | POA: Diagnosis not present

## 2021-04-16 DIAGNOSIS — Z4802 Encounter for removal of sutures: Secondary | ICD-10-CM | POA: Diagnosis not present

## 2021-04-17 DIAGNOSIS — M25511 Pain in right shoulder: Secondary | ICD-10-CM | POA: Diagnosis not present

## 2021-04-22 DIAGNOSIS — D649 Anemia, unspecified: Secondary | ICD-10-CM | POA: Diagnosis not present

## 2021-04-24 DIAGNOSIS — M25511 Pain in right shoulder: Secondary | ICD-10-CM | POA: Diagnosis not present

## 2021-04-25 ENCOUNTER — Encounter: Payer: Self-pay | Admitting: Cardiology

## 2021-04-25 ENCOUNTER — Ambulatory Visit: Payer: Medicare Other | Admitting: Cardiology

## 2021-04-25 ENCOUNTER — Other Ambulatory Visit: Payer: Self-pay | Admitting: Cardiology

## 2021-04-25 ENCOUNTER — Encounter (HOSPITAL_COMMUNITY): Payer: Self-pay

## 2021-04-25 ENCOUNTER — Inpatient Hospital Stay (HOSPITAL_COMMUNITY)
Admission: EM | Admit: 2021-04-25 | Discharge: 2021-04-27 | DRG: 244 | Disposition: A | Discharge status: Home / Self Care | Payer: Medicare Other | Attending: Cardiology | Admitting: Cardiology

## 2021-04-25 ENCOUNTER — Other Ambulatory Visit: Payer: Self-pay

## 2021-04-25 VITALS — BP 178/76 | HR 63 | Temp 98.0°F | Resp 16 | Ht 70.0 in | Wt 197.0 lb

## 2021-04-25 DIAGNOSIS — R55 Syncope and collapse: Secondary | ICD-10-CM | POA: Diagnosis present

## 2021-04-25 DIAGNOSIS — Z888 Allergy status to other drugs, medicaments and biological substances status: Secondary | ICD-10-CM

## 2021-04-25 DIAGNOSIS — W1830XA Fall on same level, unspecified, initial encounter: Secondary | ICD-10-CM | POA: Diagnosis present

## 2021-04-25 DIAGNOSIS — I2510 Atherosclerotic heart disease of native coronary artery without angina pectoris: Secondary | ICD-10-CM | POA: Diagnosis present

## 2021-04-25 DIAGNOSIS — I442 Atrioventricular block, complete: Secondary | ICD-10-CM | POA: Diagnosis not present

## 2021-04-25 DIAGNOSIS — Z7982 Long term (current) use of aspirin: Secondary | ICD-10-CM | POA: Diagnosis not present

## 2021-04-25 DIAGNOSIS — R001 Bradycardia, unspecified: Secondary | ICD-10-CM | POA: Diagnosis not present

## 2021-04-25 DIAGNOSIS — E785 Hyperlipidemia, unspecified: Secondary | ICD-10-CM | POA: Diagnosis present

## 2021-04-25 DIAGNOSIS — S01111A Laceration without foreign body of right eyelid and periocular area, initial encounter: Secondary | ICD-10-CM | POA: Diagnosis present

## 2021-04-25 DIAGNOSIS — Z950 Presence of cardiac pacemaker: Secondary | ICD-10-CM | POA: Diagnosis not present

## 2021-04-25 DIAGNOSIS — I129 Hypertensive chronic kidney disease with stage 1 through stage 4 chronic kidney disease, or unspecified chronic kidney disease: Secondary | ICD-10-CM | POA: Diagnosis present

## 2021-04-25 DIAGNOSIS — I700 Atherosclerosis of aorta: Secondary | ICD-10-CM | POA: Diagnosis not present

## 2021-04-25 DIAGNOSIS — N1831 Chronic kidney disease, stage 3a: Secondary | ICD-10-CM | POA: Diagnosis present

## 2021-04-25 DIAGNOSIS — K219 Gastro-esophageal reflux disease without esophagitis: Secondary | ICD-10-CM | POA: Diagnosis present

## 2021-04-25 DIAGNOSIS — I10 Essential (primary) hypertension: Secondary | ICD-10-CM

## 2021-04-25 DIAGNOSIS — Y92009 Unspecified place in unspecified non-institutional (private) residence as the place of occurrence of the external cause: Secondary | ICD-10-CM | POA: Diagnosis not present

## 2021-04-25 DIAGNOSIS — Z79899 Other long term (current) drug therapy: Secondary | ICD-10-CM

## 2021-04-25 HISTORY — DX: Atrioventricular block, complete: I44.2

## 2021-04-25 LAB — COMPREHENSIVE METABOLIC PANEL
ALT: 16 U/L (ref 0–44)
ALT: 18 U/L (ref 0–44)
AST: 13 U/L — ABNORMAL LOW (ref 15–41)
AST: 16 U/L (ref 15–41)
Albumin: 3.4 g/dL — ABNORMAL LOW (ref 3.5–5.0)
Albumin: 3.5 g/dL (ref 3.5–5.0)
Alkaline Phosphatase: 118 U/L (ref 38–126)
Alkaline Phosphatase: 120 U/L (ref 38–126)
Anion gap: 6 (ref 5–15)
Anion gap: 8 (ref 5–15)
BUN: 13 mg/dL (ref 8–23)
BUN: 14 mg/dL (ref 8–23)
CO2: 27 mmol/L (ref 22–32)
CO2: 24 mmol/L (ref 22–32)
Calcium: 8.2 mg/dL — ABNORMAL LOW (ref 8.9–10.3)
Calcium: 8.4 mg/dL — ABNORMAL LOW (ref 8.9–10.3)
Chloride: 110 mmol/L (ref 98–111)
Chloride: 111 mmol/L (ref 98–111)
Creatinine, Ser: 1.39 mg/dL — ABNORMAL HIGH (ref 0.61–1.24)
Creatinine, Ser: 1.39 mg/dL — ABNORMAL HIGH (ref 0.61–1.24)
GFR, Estimated: 49 mL/min — ABNORMAL LOW (ref >60)
GFR, Estimated: 49 mL/min — ABNORMAL LOW (ref >60)
Glucose, Bld: 112 mg/dL — ABNORMAL HIGH (ref 70–99)
Glucose, Bld: 108 mg/dL — ABNORMAL HIGH (ref 70–99)
Potassium: 4 mmol/L (ref 3.5–5.1)
Potassium: 3.9 mmol/L (ref 3.5–5.1)
Sodium: 143 mmol/L (ref 135–145)
Sodium: 143 mmol/L (ref 135–145)
Total Bilirubin: 1.5 mg/dL — ABNORMAL HIGH (ref 0.3–1.2)
Total Bilirubin: 1.5 mg/dL — ABNORMAL HIGH (ref 0.3–1.2)
Total Protein: 5.5 g/dL — ABNORMAL LOW (ref 6.5–8.1)
Total Protein: 5.9 g/dL — ABNORMAL LOW (ref 6.5–8.1)

## 2021-04-25 LAB — CBC
HCT: 29.8 % — ABNORMAL LOW (ref 39.0–52.0)
HCT: 30.3 % — ABNORMAL LOW (ref 39.0–52.0)
Hemoglobin: 9 g/dL — ABNORMAL LOW (ref 13.0–17.0)
Hemoglobin: 9.1 g/dL — ABNORMAL LOW (ref 13.0–17.0)
MCH: 25.4 pg — ABNORMAL LOW (ref 26.0–34.0)
MCH: 25.5 pg — ABNORMAL LOW (ref 26.0–34.0)
MCHC: 30.2 g/dL (ref 30.0–36.0)
MCHC: 30 g/dL (ref 30.0–36.0)
MCV: 84.2 fL (ref 80.0–100.0)
MCV: 84.9 fL (ref 80.0–100.0)
Platelets: 264 10*3/uL (ref 150–400)
Platelets: 248 10*3/uL (ref 150–400)
RBC: 3.54 MIL/uL — ABNORMAL LOW (ref 4.22–5.81)
RBC: 3.57 MIL/uL — ABNORMAL LOW (ref 4.22–5.81)
RDW: 17.6 % — ABNORMAL HIGH (ref 11.5–15.5)
RDW: 17.5 % — ABNORMAL HIGH (ref 11.5–15.5)
WBC: 5.6 10*3/uL (ref 4.0–10.5)
WBC: 5.4 10*3/uL (ref 4.0–10.5)
nRBC: 0 % (ref 0.0–0.2)
nRBC: 0 % (ref 0.0–0.2)

## 2021-04-25 LAB — TSH: TSH: 3.659 u[IU]/mL (ref 0.350–4.500)

## 2021-04-25 MED ORDER — SODIUM CHLORIDE 0.9% FLUSH: 3.0000 mL | INTRAVENOUS | Status: DC | PRN

## 2021-04-25 MED ORDER — ATORVASTATIN CALCIUM 10 MG PO TABS
20.0000 mg | ORAL_TABLET | Freq: Every day | ORAL | Status: DC
  Administered 2021-04-25 – 2021-04-27 (×3): 20 mg via ORAL
  Filled 2021-04-25 (×3): qty 2

## 2021-04-25 MED ORDER — CEFAZOLIN SODIUM-DEXTROSE 2-4 GM/100ML-% IV SOLN
2.0000 g | INTRAVENOUS | Status: AC
  Administered 2021-04-26: 2 g via INTRAVENOUS
  Filled 2021-04-25: qty 100

## 2021-04-25 MED ORDER — PANTOPRAZOLE SODIUM 40 MG PO TBEC
40.0000 mg | DELAYED_RELEASE_TABLET | Freq: Every day | ORAL | Status: DC
  Administered 2021-04-25 – 2021-04-27 (×3): 40 mg via ORAL
  Filled 2021-04-25 (×3): qty 1

## 2021-04-25 MED ORDER — SODIUM CHLORIDE 0.9 % IV SOLN
80.0000 mg | INTRAVENOUS | Status: AC
  Administered 2021-04-26: 80 mg
  Filled 2021-04-25: qty 2

## 2021-04-25 MED ORDER — TAMSULOSIN HCL 0.4 MG PO CAPS
0.4000 mg | ORAL_CAPSULE | Freq: Every day | ORAL | Status: DC
  Administered 2021-04-25 – 2021-04-26 (×2): 0.4 mg via ORAL
  Filled 2021-04-25 (×2): qty 1

## 2021-04-25 MED ORDER — HYDRALAZINE HCL 10 MG PO TABS: 10.0000 mg | ORAL_TABLET | Freq: Three times a day (TID) | ORAL | Status: DC

## 2021-04-25 MED ORDER — ZINC SULFATE 220 (50 ZN) MG PO CAPS
220.0000 mg | ORAL_CAPSULE | Freq: Every day | ORAL | Status: DC
  Administered 2021-04-25 – 2021-04-27 (×3): 220 mg via ORAL
  Filled 2021-04-25 (×3): qty 1

## 2021-04-25 MED ORDER — HYDRALAZINE HCL 10 MG PO TABS
10.0000 mg | ORAL_TABLET | Freq: Three times a day (TID) | ORAL | Status: DC
  Administered 2021-04-25 – 2021-04-27 (×5): 10 mg via ORAL
  Filled 2021-04-25 (×5): qty 1

## 2021-04-25 MED ORDER — SODIUM CHLORIDE 0.9 % IV SOLN: INTRAVENOUS | Status: DC

## 2021-04-25 MED ORDER — CHLORHEXIDINE GLUCONATE 4 % EX LIQD
60.0000 mL | Freq: Once | CUTANEOUS | Status: AC
  Administered 2021-04-26: 4 via TOPICAL
  Filled 2021-04-25: qty 60

## 2021-04-25 MED ORDER — CHLORHEXIDINE GLUCONATE 4 % EX LIQD
60.0000 mL | Freq: Once | CUTANEOUS | Status: AC
  Administered 2021-04-25: 4 via TOPICAL
  Filled 2021-04-25: qty 60

## 2021-04-25 MED ORDER — TRAMADOL HCL 50 MG PO TABS: 50.0000 mg | ORAL_TABLET | Freq: Four times a day (QID) | ORAL | Status: DC | PRN

## 2021-04-25 MED ORDER — SODIUM CHLORIDE 0.9% FLUSH
3.0000 mL | Freq: Two times a day (BID) | INTRAVENOUS | Status: DC
  Administered 2021-04-25 – 2021-04-26 (×2): 3 mL via INTRAVENOUS

## 2021-04-25 MED ORDER — ASCORBIC ACID 500 MG PO TABS
250.0000 mg | ORAL_TABLET | Freq: Every day | ORAL | Status: DC
  Administered 2021-04-25 – 2021-04-27 (×3): 250 mg via ORAL
  Filled 2021-04-25 (×3): qty 1

## 2021-04-25 MED ORDER — VITAMIN D 25 MCG (1000 UNIT) PO TABS
1000.0000 [IU] | ORAL_TABLET | Freq: Every day | ORAL | Status: DC
  Administered 2021-04-26 – 2021-04-27 (×2): 1000 [IU] via ORAL
  Filled 2021-04-25 (×3): qty 1

## 2021-04-25 MED ORDER — SODIUM CHLORIDE 0.9 % IV SOLN
250.0000 mL | INTRAVENOUS | Status: DC | PRN
  Administered 2021-04-25: 250 mL via INTRAVENOUS

## 2021-04-25 MED ORDER — SODIUM CHLORIDE 0.9 % IV SOLN
250.0000 mL | INTRAVENOUS | Status: DC
  Administered 2021-04-26: 250 mL via INTRAVENOUS

## 2021-04-25 NOTE — Consult Note (Addendum)
?Cardiology Consultation:  ? ?Patient ID: Brandon Fischer ?MRN: [id number]; DOB: 1933-07-27 ? ?Admit date: 04/25/2021 ?Date of Consult: 04/25/2021 ? ?PCP:  Deland Pretty, MD ?  ?[facility] Providers ?Cardiologist:  Dr. Einar Gip ? ? ? ?Patient Profile:  ? ?Brandon Fischer is a 88 y.o. male with a hx of HTN, HLD, SVT (ablated 2016), CKD (IIIa),  who is being seen 04/25/2021 for the evaluation of CHB at the request of Dr. Einar Gip. ? ?History of Present Illness:  ? ?Mr. Freeney had a fall 2 weeks ago hitting his head/face on the counter, he was evaluated in the ER, notes report a mechanical fall, though did feel dizzy just before.  Some question/mention of having taken a trazodone the evening prior for sleep ?CT noted  ?1. Extensive soft tissue contusion and scalp hematoma in the right  ?frontal scalp and periorbital region where there is also small  ?amount of gas in the preseptal soft tissues, presumably related to  ?the reported laceration. Right globe and retro-orbital soft tissues  ?are grossly normal in appearance.  ?2. Acute fracture of the medial wall of the right orbit. No other  ?acute displaced facial bone fractures are noted.  ?3. No other acute skull fracture or signs of significant acute  ?traumatic injury to the brain.  ?4. Mild-to-moderate cerebral and mild cerebellar atrophy with ex  ?vacuo dilatation of the ventricular system and chronic microvascular  ?ischemic changes in the cerebral white matter.  ?5. No evidence of significant acute traumatic injury to the cervical  ?spine.  ?6. Multilevel degenerative disc disease and cervical spondylosis, as  ?above.  ? ?EKG noted findings of Mobitz one, d/w attending cardiology team, his amlodipine was stopped and follow up in the office ?Opthalmology also recommended out patient follow up and discharged from the ER ? ?At his follow up today with Dr. Einar Gip reported feeling well without recurrent symptoms or syncope.  His EKG though looked suspect for CHB. ?He was  referred to the ER to be admitted and EP consultation.  He has baseline known marked 1st degree AVBlock, again discussed Mobitz I a couple weeks ago, and further advancement of conduction system disease despite mo nodal blocking agents. ? ?Pt reports his fall probably a brief fainting episode, declines today having any warning with the event. ?He denies any cp today or otherwise. ?No SOB ? ?In the ER his EKG noted with advanced heart  block, narrow QRS  ? ?LABS ?K+ 4.0 ?BUN/Creat 13/1.39 ?WBC 5.6 ?H/H 9.0/29.8 ?Plts 264 ?TSH 2.920 ? ?Past Medical History:  ?Diagnosis Date  ? Bilateral carotid artery stenosis without cerebral infarction   ? ASYMPTOMATIC--  BILATERAL ICA  50-69%  PER CARDIOLOGIST NOTE, DR Einar Gip  ? Bilateral carotid bruits   ? LEFT  soft bruit, right no no carotid bruit per 07-24-2014 dr Einar Gip note  ? CKD (chronic kidney disease)   ? dr foster [place] kidney, ckd stage 3 per lov 02-16-2019  ? Complication of anesthesia   ? 1 episode atrial fib 02-09-2014 [facility] and followed up with cardiology, not further issue  ? Coronary artery disease   ? NON-OBSTRUCTIVE CAD  per cath 08-06-2004 HX PALPITATIONS-AND -TACHYCARDIA-  ? First degree heart block   ? Full dentures   ? GERD (gastroesophageal reflux disease)   ? Heart palpitations   ? Not any more  ? History of atrial fibrillation without current medication   ? EPISODE OF AFIB WITH RVR INTRAOPERATIVELY 02-09-2014 AT [facility]--  RESOLVED AND PT  FOLLOWED UP WITH CARDIOLOGIST  DR Einar Gip  ? History of kidney stones   ? Hyperlipidemia   ? Hypertension   ? Nephrolithiasis   ? RIGHT  ? Organic impotence   ? Psoriasis   ? Clearing up  ? PSVT (paroxysmal supraventricular tachycardia) ([place])   ? Right ureteral calculus   ? S/P radiofrequency ablation operation for arrhythmia-SVT 04/28/2014  ? Simple renal cyst   ? right  ? Wears glasses   ? Wears hearing aid   ? BILATERAL  ? ? ?Past Surgical History:  ?Procedure Laterality Date  ? CARDIAC CATHETERIZATION  08-06-2004 dr Verlon Setting   ? (abnormal stress test) Non-obstructive CAD, pLAD 20-30%,  LCX 30-40%, pRCA 20-30%, dRCA 40%, distal LM  mild diffuse calcifation 20-30% tapering stenosis,  preserved LVSF ef 55-60%  ? CARPAL TUNNEL RELEASE Bilateral 2005  ? CATARACT EXTRACTION, BILATERAL Bilateral 2011  ? CHOLECYSTECTOMY OPEN  1982  ? and NEPHROLITHOTOMY  ? COLONOSCOPY W/ POLYPECTOMY  last one 04-05-2008  ? CYSTOSCOPY WITH LITHOLAPAXY Right 04/14/2017  ? Procedure: CYSTOSCOPY WITH LITHOLAPAXY/RIGHT RETROGRADE PLYOGRAM/RIGHT URETEROSCOPY;  Surgeon: [name], MD;  Location: [facility];  Service: Urology;  Laterality: Right;  ? CYSTOSCOPY WITH RETROGRADE PYELOGRAM, URETEROSCOPY AND STENT PLACEMENT Right 03/14/2014  ? Procedure: CYSTO/RIGHT RETROGRADE PYELOGRAM/RIGHT URETEROSCOPY/STONE EXTRACTION/STENT PLACEMENT;  Surgeon: Malka So, MD;  Location[facility];  Service: Urology;  Laterality: Right;  ? CYSTOSCOPY WITH RETROGRADE PYELOGRAM, URETEROSCOPY AND STENT PLACEMENT Left 04/19/2019  ? Procedure: CYSTOSCOPY WITH LEFT  RETROGRADE LEFT , URETEROSCOPY WITH HOLMIUM LASER, BASKET EXTRACTION AND STENT PLACEMENT;  Surgeon: [name], MD;  Location: [facility];  Service: Urology;  Laterality: Left;  ? CYSTOSCOPY WITH RETROGRADE PYELOGRAM, URETEROSCOPY AND STENT PLACEMENT Left 03/30/2020  ? Procedure: CYSTOSCOPY WITH RETROGRADE PYELOGRAM, URETEROSCOPY, BASKETTING OF STONE AND STENT PLACEMENT;  Surgeon: Alexis Frock, MD;  Location: [facility];  Service: Urology;  Laterality: Left;  ? EYE SURGERY Bilateral   ? Cataracts removed  ? HOLMIUM LASER APPLICATION Right 07/16/5327  ? Procedure: RIGHT HOLMIUM LASER APPLICATION;  Surgeon: Malka So, MD;  Location: [facility];  Service: Urology;  Laterality: Right;  ? KNEE ARTHROSCOPY W/ DEBRIDEMENT Right 11-23-2002  ? and Synovectomy  ? LUMBAR LAMINECTOMY/DECOMPRESSION MICRODISCECTOMY N/A 07/15/2018  ? Procedure: Posterior lumbar decompression and fusion  L4-5;  Surgeon: Melina Schools, MD;  Location: [place] [facility];  Service: Orthopedics;  Laterality: N/A;  4 hrs  ? NEPHROLITHOTOMY  01/16/2011  ? Procedure: NEPHROLITHOTOMY PERCUTANEOUS;  Surgeon: Malka So;  Location: [facility];  Service: Urology;  Laterality: Left;  C-Arm  Holmium Laser  ? NEPHROLITHOTOMY  1981  ? SUPRAVENTRICULAR TACHYCARDIA ABLATION N/A 04/28/2014  ? Procedure: SUPRAVENTRICULAR TACHYCARDIA ABLATION;  Surgeon: Evans Lance, MD;  Location: [facility] CATH LAB;  Service: Cardiovascular;  Laterality: N/A;  ?  ? ?Home Medications:  ?Prior to Admission medications   ?Medication Sig Start Date End Date Taking? Authorizing Provider  ?Ascorbic Acid (VITAMIN C PO) Take 1 tablet by mouth daily.   Yes [provider]  ?aspirin (ASPIRIN CHILDRENS) 81 MG chewable tablet Chew 1 tablet (81 mg total) by mouth daily. 07/20/20  Yes Adrian Prows, MD  ?atorvastatin (LIPITOR) 20 MG tablet Take 20 mg by mouth daily.    Yes [provider]  ?Cholecalciferol (VITAMIN D-3) 25 MCG (1000 UT) CAPS Take 1,000 Units by mouth daily with breakfast.   Yes [provider]  ?hydrALAZINE (APRESOLINE) 10 MG tablet Take 1 tablet (10  mg total) by mouth 3 (three) times daily. 04/07/21 05/07/21 Yes Blue, Soijett A, PA-C  ?losartan (COZAAR) 50 MG tablet Take 50 mg by mouth daily. 04/23/20  Yes [provider]  ?pantoprazole (PROTONIX) 40 MG tablet Take 40 mg by mouth in the morning and at bedtime.    Yes [provider]  ?tamsulosin (FLOMAX) 0.4 MG CAPS capsule Take 0.4 mg by mouth daily after supper.   Yes [provider]  ?traMADol (ULTRAM) 50 MG tablet Take 1 tablet (50 mg total) by mouth every 6 (six) hours as needed for moderate pain or severe pain. Post-operatively 03/30/20  Yes Alexis Frock, MD  ?zinc gluconate 50 MG tablet Take 50 mg by mouth daily.   Yes [provider]  ? ? ?Inpatient Medications: ?Scheduled Meds: ? ?Continuous Infusions: ? sodium chloride 250 mL (04/25/21 1456)  ? ?PRN  Meds: ?sodium chloride ? ?Allergies:    ?Allergies  ?Allergen Reactions  ? Bupropion   ?  Other reaction(s): disoriented  ? Olmesartan   ?  Other reaction(s): rash  ? Trazodone   ?  Other reaction(s): un

## 2021-04-25 NOTE — Progress Notes (Addendum)
? ?Primary Physician/Referring:  Deland Pretty, MD ? ?Patient ID: Brandon Fischer, male    DOB: 1933/04/23, 88 y.o.   MRN: [id number] ? ?Chief Complaint  ?Patient presents with  ? 1st degree AV block  ? Hypertension  ? Follow-up  ? Loss of Consciousness  ? ?HPI:   ? ?Brandon Fischer  is a 88 y.o. Caucasian male with hypertension, hyperlipidemia, SVT ablation in 2016, stage IIIa chronic kidney disease, first-degree AV block by EKG, bilateral carotid bruit, first-degree block on the EKG.   ? ?Patient was seen in the emergency room on 04/07/2021 with a fall, felt to be mechanical, EKG activated first-degree AV block and also intermittent Mobitz 1 AV block.  He now presents to the office for follow-up.  Fortunately he has not had any further episodes of fall or near syncope.  He is accompanied by his son-in-law.  Denies chest pain or dyspnea. ? ? ?Past Medical History:  ?Diagnosis Date  ? Bilateral carotid artery stenosis without cerebral infarction   ? ASYMPTOMATIC--  BILATERAL ICA  50-69%  PER CARDIOLOGIST NOTE, DR Einar Gip  ? Bilateral carotid bruits   ? LEFT  soft bruit, right no no carotid bruit per 07-24-2014 dr Einar Gip note  ? CKD (chronic kidney disease)   ? dr foster [place] kidney, ckd stage 3 per lov 02-16-2019  ? Complication of anesthesia   ? 1 episode atrial fib 02-09-2014 [facility] and followed up with cardiology, not further issue  ? Coronary artery disease   ? NON-OBSTRUCTIVE CAD  per cath 08-06-2004 HX PALPITATIONS-AND -TACHYCARDIA-  ? First degree heart block   ? Full dentures   ? GERD (gastroesophageal reflux disease)   ? Heart palpitations   ? Not any more  ? History of atrial fibrillation without current medication   ? EPISODE OF AFIB WITH RVR INTRAOPERATIVELY 02-09-2014 AT [facility]--  RESOLVED AND PT FOLLOWED UP WITH CARDIOLOGIST  DR Einar Gip  ? History of kidney stones   ? Hyperlipidemia   ? Hypertension   ? Nephrolithiasis   ? RIGHT  ? Organic impotence   ? Psoriasis   ? Clearing up  ? PSVT (paroxysmal  supraventricular tachycardia) ([place])   ? Right ureteral calculus   ? S/P radiofrequency ablation operation for arrhythmia-SVT 04/28/2014  ? Simple renal cyst   ? right  ? Wears glasses   ? Wears hearing aid   ? BILATERAL  ? ?Past Surgical History:  ?Procedure Laterality Date  ? CARDIAC CATHETERIZATION  08-06-2004 dr Verlon Setting  ? (abnormal stress test) Non-obstructive CAD, pLAD 20-30%,  LCX 30-40%, pRCA 20-30%, dRCA 40%, distal LM  mild diffuse calcifation 20-30% tapering stenosis,  preserved LVSF ef 55-60%  ? CARPAL TUNNEL RELEASE Bilateral 2005  ? CATARACT EXTRACTION, BILATERAL Bilateral 2011  ? CHOLECYSTECTOMY OPEN  1982  ? and NEPHROLITHOTOMY  ? COLONOSCOPY W/ POLYPECTOMY  last one 04-05-2008  ? CYSTOSCOPY WITH LITHOLAPAXY Right 04/14/2017  ? Procedure: CYSTOSCOPY WITH LITHOLAPAXY/RIGHT RETROGRADE PLYOGRAM/RIGHT URETEROSCOPY;  Surgeon: [name], MD;  [facility];  Service: Urology;  Laterality: Right;  ? CYSTOSCOPY WITH RETROGRADE PYELOGRAM, URETEROSCOPY AND STENT PLACEMENT Right 03/14/2014  ? Procedure: CYSTO/RIGHT RETROGRADE PYELOGRAM/RIGHT URETEROSCOPY/STONE EXTRACTION/STENT PLACEMENT;  Surgeon: Malka So, MD;  Location: [facility];  Service: Urology;  Laterality: Right;  ? CYSTOSCOPY WITH RETROGRADE PYELOGRAM, URETEROSCOPY AND STENT PLACEMENT Left 04/19/2019  ? Procedure: CYSTOSCOPY WITH LEFT  RETROGRADE LEFT , URETEROSCOPY WITH HOLMIUM LASER, BASKET EXTRACTION AND STENT PLACEMENT;  Surgeon: [name], MD;  Location: [place];  Service: Urology;  Laterality: Left;  ? CYSTOSCOPY WITH RETROGRADE PYELOGRAM, URETEROSCOPY AND STENT PLACEMENT Left 03/30/2020  ? Procedure: CYSTOSCOPY WITH RETROGRADE PYELOGRAM, URETEROSCOPY, BASKETTING OF STONE AND STENT PLACEMENT;  Surgeon: Alexis Frock, MD;  Location[facility];  Service: Urology;  Laterality: Left;  ? EYE SURGERY Bilateral   ? Cataracts removed  ? HOLMIUM LASER APPLICATION Right 07/14/5364  ? Procedure: RIGHT  HOLMIUM LASER APPLICATION;  Surgeon: Malka So, MD;  Location: [facility];  Service: Urology;  Laterality: Right;  ? KNEE ARTHROSCOPY W/ DEBRIDEMENT Right 11-23-2002  ? and Synovectomy  ? LUMBAR LAMINECTOMY/DECOMPRESSION MICRODISCECTOMY N/A 07/15/2018  ? Procedure: Posterior lumbar decompression and fusion L4-5;  Surgeon: Melina Schools, MD;  Location: [place];  Service: Orthopedics;  Laterality: N/A;  4 hrs  ? NEPHROLITHOTOMY  01/16/2011  ? Procedure: NEPHROLITHOTOMY PERCUTANEOUS;  Surgeon: Malka So;  [facility];  Service: Urology;  Laterality: Left;  C-Arm  Holmium Laser  ? NEPHROLITHOTOMY  1981  ? SUPRAVENTRICULAR TACHYCARDIA ABLATION N/A 04/28/2014  ? Procedure: SUPRAVENTRICULAR TACHYCARDIA ABLATION;  Surgeon: Evans Lance, MD;  Location: [facility] CATH LAB;  Service: Cardiovascular;  Laterality: N/A;  ? ?Family History  ?Problem Relation Age of Onset  ? Cancer Mother   ?     Ovarian or colon  ? Heart attack Father   ? Heart attack Brother   ?  ?Social History  ? ?Tobacco Use  ? Smoking status: Former  ?  Packs/day: 1.00  ?  Years: 40.00  ?  Pack years: 40.00  ?  Types: Cigarettes  ?  Quit date: 02/06/1998  ?  Years since quitting: 23.2  ? Smokeless tobacco: Never  ?Substance Use Topics  ? Alcohol use: No  ? ?Marital Status: Widowed  ?ROS  ?Review of Systems  ?HENT:  Positive for hearing loss.   ?Cardiovascular:  Positive for syncope. Negative for chest pain, dyspnea on exertion and leg swelling.  ?Gastrointestinal:  Negative for melena.  ?Objective  ?Blood pressure (!) 178/76, pulse 63, temperature 98 ?F (36.7 ?C), resp. rate 16, height 5' 10"  (1.778 m), weight 197 lb (89.4 kg), SpO2 99 %. Body mass index is 28.27 kg/m?.  ? ? ?  04/25/2021  ? 11:20 AM 04/07/2021  ? 11:30 AM 04/07/2021  ? 11:00 AM  ?Vitals with BMI  ?Height 5' 10"     ?Weight 197 lbs    ?BMI 28.27    ?Systolic 440 347 425  ?Diastolic 76 59 63  ?Pulse 63 57 74  ?  ? Physical Exam ?Neck:  ?   Vascular: No JVD.  ?Cardiovascular:  ?    Rate and Rhythm: Bradycardia present. Rhythm irregular.  ?   Pulses: Normal pulses and intact distal pulses.     ?     Carotid pulses are  on the right side with bruit and  on the left side with bruit. ?   Heart sounds: S1 normal and S2 normal. Murmur heard.  ?Crescendo systolic murmur is present with a grade of 2/6 at the upper right sternal border.  ?Early diastolic murmur is present with a grade of 2/4 at the upper right sternal border.  ?  No gallop.  ?Pulmonary:  ?   Effort: Pulmonary effort is normal.  ?   Breath sounds: Normal breath sounds.  ?Abdominal:  ?   General: Bowel sounds are normal.  ?   Palpations: Abdomen is soft.  ?Musculoskeletal:  ?  Right lower leg: No edema.  ?   Left lower leg: No edema.  ? ?Laboratory examination:  ? ?Recent Labs  ?  04/07/21 ?0809  ?NA 140  ?K 3.4*  ?CL 109  ?CO2 24  ?GLUCOSE 135*  ?BUN 24*  ?CREATININE 1.45*  ?CALCIUM 8.4*  ?GFRNONAA 47*  ? ?estimated creatinine clearance is 41.2 mL/min (A) (by C-G formula based on SCr of 1.45 mg/dL (H)).  ? ?  Latest Ref Rng & Units 04/07/2021  ?  8:09 AM 03/30/2020  ? 11:34 AM 07/27/2019  ? 10:54 AM  ?CMP  ?Glucose 70 - 99 mg/dL 135   117   149    ?BUN 8 - 23 mg/dL 24   23   15     ?Creatinine 0.61 - 1.24 mg/dL 1.45   1.60   1.37    ?Sodium 135 - 145 mmol/L 140   142   136    ?Potassium 3.5 - 5.1 mmol/L 3.4   3.7   4.3    ?Chloride 98 - 111 mmol/L 109   103   100    ?CO2 22 - 32 mmol/L 24    26    ?Calcium 8.9 - 10.3 mg/dL 8.4    8.7    ? ? ?  Latest Ref Rng & Units 04/07/2021  ?  8:09 AM 03/30/2020  ? 11:34 AM 07/27/2019  ? 10:54 AM  ?CBC  ?WBC 4.0 - 10.5 K/uL 11.3    6.4    ?Hemoglobin 13.0 - 17.0 g/dL 9.6   10.9   10.9    ?Hematocrit 39.0 - 52.0 % 30.0   32.0   34.0    ?Platelets 150 - 400 K/uL 170    234    ? ? ?Recent Labs  ?  04/07/21 ?0922  ?TSH 2.920  ? ? ? ?External labs:  ? ?Lab 05/12/2020: ? ?TSH mildly elevated at 7.140. ? ?Vitamin B12 866. ? ?Hb 10.7/HCT 33.6, platelets 143 (140-400).  Microcytic indicis. ? ?Serum glucose 104 mg,  BUN 17, creatinine 1.47, EGFR 46 mL, potassium 4.6, CMP otherwise normal. ? ?Total cholesterol 121, triglycerides 63, HDL 47, LDL 61. ? ?Medications and allergies  ? ?Allergies  ?Allergen Reactions  ? Bupropion

## 2021-04-25 NOTE — Progress Notes (Signed)
Page oncall cardiology for Dr. Irven Shelling office due to elevated blood pressure.  ?

## 2021-04-25 NOTE — ED Provider Notes (Signed)
?Brandon Fischer ?Provider Note ? ? ?CSN: [id number] ?Arrival date & time: 04/25/21  1259 ? ?  ? ?History ? ?No chief complaint on file. ? ? ?Brandon Fischer is a 88 y.o. male. ? ?HPI ?Patient presents sent in for complete heart block.  Sent in by Dr. Irven Shelling office.  Had syncope a couple weeks ago.  Followed up in office today and found to have a complete heart block on EKG.  No further syncope.  No chest pain.  Ate breakfast today but has not otherwise eaten.  Dr. Amanda Cockayne had discussed with electrophysiology here. ?  ? ?Home Medications ?Prior to Admission medications   ?Medication Sig Start Date End Date Taking? Authorizing Provider  ?Ascorbic Acid (VITAMIN C PO) Take by mouth daily.    [provider]  ?aspirin (ASPIRIN CHILDRENS) 81 MG chewable tablet Chew 1 tablet (81 mg total) by mouth daily. 07/20/20   Adrian Prows, MD  ?atorvastatin (LIPITOR) 20 MG tablet Take 20 mg by mouth daily.     [provider]  ?Cholecalciferol (VITAMIN D-3) 25 MCG (1000 UT) CAPS Take 1,000 Units by mouth daily with breakfast.    [provider]  ?hydrALAZINE (APRESOLINE) 10 MG tablet Take 1 tablet (10 mg total) by mouth 3 (three) times daily. 04/07/21 05/07/21  Blue, Soijett A, PA-C  ?losartan (COZAAR) 50 MG tablet Take 1 tablet by mouth daily. 04/23/20   [provider]  ?Melatonin 1 MG CAPS Take 1 capsule by mouth at bedtime.    [provider]  ?pantoprazole (PROTONIX) 40 MG tablet Take 40 mg by mouth in the morning and at bedtime.     [provider]  ?tamsulosin (FLOMAX) 0.4 MG CAPS capsule Take 0.4 mg by mouth daily after supper.    [provider]  ?traMADol (ULTRAM) 50 MG tablet Take 1 tablet (50 mg total) by mouth every 6 (six) hours as needed for moderate pain or severe pain. Post-operatively 03/30/20   Alexis Frock, MD  ?zinc gluconate 50 MG tablet Take 50 mg by mouth daily.    [provider]  ?   ? ?Allergies     ?Bupropion, Olmesartan, and Trazodone   ? ?Review of Systems   ?Review of Systems  ?Constitutional:  Negative for appetite change.  ?Respiratory:  Negative for shortness of breath.   ?Neurological:  Positive for syncope.  ? ?Physical Exam ?Updated Vital Signs ?BP (!) 188/58 (BP Location: Right Arm)   Pulse (!) 59   Temp 98.4 ?F (36.9 ?C) (Oral)   Resp 20   SpO2 100%  ?Physical Exam ?Vitals and nursing note reviewed.  ?Cardiovascular:  ?   Rate and Rhythm: Regular rhythm.  ?Abdominal:  ?   Tenderness: There is no abdominal tenderness.  ?Musculoskeletal:     ?   General: No tenderness.  ?   Cervical back: Neck supple.  ?Skin: ?   Capillary Refill: Capillary refill takes less than 2 seconds.  ?Neurological:  ?   Mental Status: He is alert and oriented to person, place, and time.  ?   Comments: Somewhat hard of hearing but is awake and appropriate.  ? ? ?ED Results / Procedures / Treatments   ?Labs ?(all labs ordered are listed, but only abnormal results are displayed) ?Labs Reviewed  ?CBC  ?COMPREHENSIVE METABOLIC PANEL  ? ? ?EKG ?None ? ?Radiology ?No results found. ? ?Procedures ?Procedures  ? ? ?Medications Ordered in ED ?Medications - No data to display ? ?  ED Course/ Medical Decision Making/ A&P ?  ?                        ?Medical Decision Making ?Amount and/or Complexity of Data Reviewed ?Labs: ordered. ? ?Risk ?Decision regarding hospitalization. ? ? ?Patient sent from cardiology with complete heart block.  Reviewed cardiology note by Dr. Nadyne Coombes.  EKG here does show what appears to be a heart block.  Lab work ordered.  Awake and appropriate this time.  ?Reviewing records it appears as if Dr. Rosalia Hammers will admit patient with EP consultation. ? ? ? ? ? ? ? ?Final Clinical Impression(s) / ED Diagnoses ?Final diagnoses:  ?Heart block AV third degree ([place])  ? ? ?Rx / DC Orders ?ED Discharge Orders   ? ? None  ? ?  ? ? ?  ?Davonna Belling, MD ?04/25/21 1509 ? ?

## 2021-04-25 NOTE — ED Triage Notes (Signed)
Pt was sent by cardiologist to get a pacemaker placed. Pt denies chest pain and SOB at this time. ?

## 2021-04-25 NOTE — H&P (Signed)
? ?Primary Physician/Referring:  Deland Pretty, MD ? ?Patient ID: Brandon Fischer, male    DOB: March 10, 1933, 88 y.o.   MRN: [id number] ? ?CC: Syncope ? ?HPI:   ? ?Brandon Fischer  is a 88 y.o. Caucasian male with hypertension, hyperlipidemia, SVT ablation in 2016, stage IIIa chronic kidney disease, first-degree AV block by EKG, bilateral carotid bruit, first-degree block on the EKG.   ? ?Patient was seen in the emergency room on 04/07/2021 with a fall, felt to be mechanical, EKG activated first-degree AV block and also intermittent Mobitz 1 AV block.  He now presents to the office for follow-up.  Fortunately he has not had any further episodes of fall or near syncope.  He is accompanied by his son-in-law.  Denies chest pain or dyspnea. ? ? ?Past Medical History:  ?Diagnosis Date  ? Bilateral carotid artery stenosis without cerebral infarction   ? ASYMPTOMATIC--  BILATERAL ICA  50-69%  PER CARDIOLOGIST NOTE, DR Einar Gip  ? Bilateral carotid bruits   ? LEFT  soft bruit, right no no carotid bruit per 07-24-2014 dr Einar Gip note  ? CKD (chronic kidney disease)   ? dr foster [place] kidney, ckd stage 3 per lov 02-16-2019  ? Complication of anesthesia   ? 1 episode atrial fib 02-09-2014 [facility] and followed up with cardiology, not further issue  ? Coronary artery disease   ? NON-OBSTRUCTIVE CAD  per cath 08-06-2004 HX PALPITATIONS-AND -TACHYCARDIA-  ? First degree heart block   ? Full dentures   ? GERD (gastroesophageal reflux disease)   ? Heart palpitations   ? Not any more  ? History of atrial fibrillation without current medication   ? EPISODE OF AFIB WITH RVR INTRAOPERATIVELY 02-09-2014 AT [facility]--  RESOLVED AND PT FOLLOWED UP WITH CARDIOLOGIST  DR Einar Gip  ? History of kidney stones   ? Hyperlipidemia   ? Hypertension   ? Nephrolithiasis   ? RIGHT  ? Organic impotence   ? Psoriasis   ? Clearing up  ? PSVT (paroxysmal supraventricular tachycardia) ([place])   ? Right ureteral calculus   ? S/P radiofrequency ablation operation for  arrhythmia-SVT 04/28/2014  ? Simple renal cyst   ? right  ? Wears glasses   ? Wears hearing aid   ? BILATERAL  ? ?Past Surgical History:  ?Procedure Laterality Date  ? CARDIAC CATHETERIZATION  08-06-2004 dr Verlon Setting  ? (abnormal stress test) Non-obstructive CAD, pLAD 20-30%,  LCX 30-40%, pRCA 20-30%, dRCA 40%, distal LM  mild diffuse calcifation 20-30% tapering stenosis,  preserved LVSF ef 55-60%  ? CARPAL TUNNEL RELEASE Bilateral 2005  ? CATARACT EXTRACTION, BILATERAL Bilateral 2011  ? CHOLECYSTECTOMY OPEN  1982  ? and NEPHROLITHOTOMY  ? COLONOSCOPY W/ POLYPECTOMY  last one 04-05-2008  ? CYSTOSCOPY WITH LITHOLAPAXY Right 04/14/2017  ? Procedure: CYSTOSCOPY WITH LITHOLAPAXY/RIGHT RETROGRADE PLYOGRAM/RIGHT URETEROSCOPY;  Surgeon: [name], MD;  [facility];  Service: Urology;  Laterality: Right;  ? CYSTOSCOPY WITH RETROGRADE PYELOGRAM, URETEROSCOPY AND STENT PLACEMENT Right 03/14/2014  ? Procedure: CYSTO/RIGHT RETROGRADE PYELOGRAM/RIGHT URETEROSCOPY/STONE EXTRACTION/STENT PLACEMENT;  Surgeon: Malka So, MD;  Location: [facility];  Service: Urology;  Laterality: Right;  ? CYSTOSCOPY WITH RETROGRADE PYELOGRAM, URETEROSCOPY AND STENT PLACEMENT Left 04/19/2019  ? Procedure: CYSTOSCOPY WITH LEFT  RETROGRADE LEFT , URETEROSCOPY WITH HOLMIUM LASER, BASKET EXTRACTION AND STENT PLACEMENT;  Surgeon: [name], MD;  Location: [facility];  Service: Urology;  Laterality: Left;  ? CYSTOSCOPY WITH RETROGRADE PYELOGRAM, URETEROSCOPY AND STENT PLACEMENT Left  03/30/2020  ? Procedure: CYSTOSCOPY WITH RETROGRADE PYELOGRAM, URETEROSCOPY, BASKETTING OF STONE AND STENT PLACEMENT;  Surgeon: Alexis Frock, MD;  Location: [facility];  Service: Urology;  Laterality: Left;  ? EYE SURGERY Bilateral   ? Cataracts removed  ? HOLMIUM LASER APPLICATION Right 02/18/4268  ? Procedure: RIGHT HOLMIUM LASER APPLICATION;  Surgeon: Malka So, MD;  Location: [facility];  Service: Urology;   Laterality: Right;  ? KNEE ARTHROSCOPY W/ DEBRIDEMENT Right 11-23-2002  ? and Synovectomy  ? LUMBAR LAMINECTOMY/DECOMPRESSION MICRODISCECTOMY N/A 07/15/2018  ? Procedure: Posterior lumbar decompression and fusion L4-5;  Surgeon: Melina Schools, MD;  Location: [place];  Service: Orthopedics;  Laterality: N/A;  4 hrs  ? NEPHROLITHOTOMY  01/16/2011  ? Procedure: NEPHROLITHOTOMY PERCUTANEOUS;  Surgeon: Malka So;  [facility];  Service: Urology;  Laterality: Left;  C-Arm  Holmium Laser  ? NEPHROLITHOTOMY  1981  ? SUPRAVENTRICULAR TACHYCARDIA ABLATION N/A 04/28/2014  ? Procedure: SUPRAVENTRICULAR TACHYCARDIA ABLATION;  Surgeon: Evans Lance, MD;  Location: [facility] CATH LAB;  Service: Cardiovascular;  Laterality: N/A;  ? ?Family History  ?Problem Relation Age of Onset  ? Cancer Mother   ?     Ovarian or colon  ? Heart attack Father   ? Heart attack Brother   ?  ?Social History  ? ?Tobacco Use  ? Smoking status: Former  ?  Packs/day: 1.00  ?  Years: 40.00  ?  Pack years: 40.00  ?  Types: Cigarettes  ?  Quit date: 02/06/1998  ?  Years since quitting: 23.2  ? Smokeless tobacco: Never  ?Substance Use Topics  ? Alcohol use: No  ? ?Marital Status: Widowed  ?ROS  ?Review of Systems  ?HENT:  Positive for hearing loss.   ?Cardiovascular:  Positive for syncope. Negative for chest pain, dyspnea on exertion and leg swelling.  ?Gastrointestinal:  Negative for melena.  ?Objective  ?There were no vitals taken for this visit. There is no height or weight on file to calculate BMI.  ? ? ?  04/25/2021  ? 11:20 AM 04/07/2021  ? 11:30 AM 04/07/2021  ? 11:00 AM  ?Vitals with BMI  ?Height 5' 10"    ?Weight 197 lbs    ?BMI 28.27    ?Systolic 623 762 831  ?Diastolic 76 59 63  ?Pulse 63 57 74  ?  ? Physical Exam ?Neck:  ?   Vascular: No JVD.  ?Cardiovascular:  ?   Rate and Rhythm: Bradycardia present. Rhythm irregular.  ?   Pulses: Normal pulses and intact distal pulses.     ?     Carotid pulses are  on the right side with bruit and  on the left side  with bruit. ?   Heart sounds: S1 normal and S2 normal. Murmur heard.  ?Crescendo systolic murmur is present with a grade of 2/6 at the upper right sternal border.  ?Early diastolic murmur is present with a grade of 2/4 at the upper right sternal border.  ?  No gallop.  ?Pulmonary:  ?   Effort: Pulmonary effort is normal.  ?   Breath sounds: Normal breath sounds.  ?Abdominal:  ?   General: Bowel sounds are normal.  ?   Palpations: Abdomen is soft.  ?Musculoskeletal:  ?   Right lower leg: No edema.  ?   Left lower leg: No edema.  ? ?Laboratory examination:  ? ?Recent Labs  ?  04/07/21 ?0809  ?NA 140  ?K 3.4*  ?CL  109  ?CO2 24  ?GLUCOSE 135*  ?BUN 24*  ?CREATININE 1.45*  ?CALCIUM 8.4*  ?GFRNONAA 47*  ? ?estimated creatinine clearance is 41.2 mL/min (A) (by C-G formula based on SCr of 1.45 mg/dL (H)).  ? ?  Latest Ref Rng & Units 04/07/2021  ?  8:09 AM 03/30/2020  ? 11:34 AM 07/27/2019  ? 10:54 AM  ?CMP  ?Glucose 70 - 99 mg/dL 135   117   149    ?BUN 8 - 23 mg/dL _0 ?Creatinine 0.61 - 1.24 mg/dL 1.45   1.60   1.37    ?Sodium 135 - 145 mmol/L 140   142   136    ?Potassium 3.5 - 5.1 mmol/L 3.4   3.7   4.3    ?Chloride 98 - 111 mmol/L 109   103   100    ?CO2 22 - 32 mmol/L 24    26    ?Calcium 8.9 - 10.3 mg/dL 8.4    8.7    ? ? ?  Latest Ref Rng & Units 04/07/2021  ?  8:09 AM 03/30/2020  ? 11:34 AM 07/27/2019  ? 10:54 AM  ?CBC  ?WBC 4.0 - 10.5 K/uL 11.3    6.4    ?Hemoglobin 13.0 - 17.0 g/dL 9.6   10.9   10.9    ?Hematocrit 39.0 - 52.0 % 30.0   32.0   34.0    ?Platelets 150 - 400 K/uL 170    234    ? ? ?Recent Labs  ?  04/07/21 ?0922  ?TSH 2.920  ? ?External labs:  ? ?Lab 05/12/2020: ? ?TSH mildly elevated at 7.140. ? ?Vitamin B12 866. ? ?Hb 10.7/HCT 33.6, platelets 143 (140-400).  Microcytic indicis. ? ?Serum glucose 104 mg, BUN 17, creatinine 1.47, EGFR 46 mL, potassium 4.6, CMP otherwise normal. ? ?Total cholesterol 121, triglycerides 63, HDL 47, LDL 61. ? ?Medications and allergies  ? ?Allergies  ?Allergen  Reactions  ? Bupropion   ?  Other reaction(s): disoriented  ? Olmesartan   ?  Other reaction(s): rash  ? Trazodone   ?  Other reaction(s): unsteady gait  ?  ? ?Medications  ? ? ?Current Outpatient Medications:

## 2021-04-25 NOTE — TOC Progression Note (Signed)
Transition of Care (TOC) - Progression Note  ? ? ?Patient Details  ?Name: Brandon Fischer ?MRN: [id number] ?Date of Birth: July 17, 1933 ? ?Transition of Care (TOC) CM/SW Contact  ?Zenon Mayo, RN ?Phone Number: ?04/25/2021, 5:13 PM ? ?Clinical Narrative:    ? ?Transition of Care (TOC) Screening Note ? ? ?Patient Details  ?Name: Brandon Fischer ?Date of Birth: 1933-08-18 ? ? ?Transition of Care (TOC) CM/SW Contact:    ?Zenon Mayo, RN ?Phone Number: ?04/25/2021, 5:14 PM ? ? ? ?Transition of Care Department [facility]) has reviewed patient and no TOC needs have been identified at this time. We will continue to monitor patient advancement through interdisciplinary progression rounds. If new patient transition needs arise, please place a TOC consult. ?  ? ? ?  ?  ? ?Expected Discharge Plan and Services ?  ?  ?  ?  ?  ?                ?  ?  ?  ?  ?  ?  ?  ?  ?  ?  ? ? ?Social Determinants of Health (SDOH) Interventions ?  ? ?Readmission Risk Interventions ?   ? View : No data to display.  ?  ?  ?  ? ? ?

## 2021-04-26 ENCOUNTER — Encounter (HOSPITAL_COMMUNITY)
Admission: EM | Disposition: A | Discharge status: Home / Self Care | Payer: Self-pay | Source: Home / Self Care | Attending: Cardiology

## 2021-04-26 ENCOUNTER — Encounter (HOSPITAL_COMMUNITY): Payer: Self-pay | Admitting: Cardiology

## 2021-04-26 ENCOUNTER — Other Ambulatory Visit (HOSPITAL_COMMUNITY): Payer: Medicare Other

## 2021-04-26 ENCOUNTER — Encounter: Payer: Self-pay | Admitting: Cardiology

## 2021-04-26 DIAGNOSIS — Z950 Presence of cardiac pacemaker: Secondary | ICD-10-CM

## 2021-04-26 DIAGNOSIS — Z45018 Encounter for adjustment and management of other part of cardiac pacemaker: Secondary | ICD-10-CM | POA: Insufficient documentation

## 2021-04-26 HISTORY — PX: PACEMAKER IMPLANT: EP1218

## 2021-04-26 HISTORY — DX: Encounter for adjustment and management of other part of cardiac pacemaker: Z45.018

## 2021-04-26 HISTORY — DX: Presence of cardiac pacemaker: Z95.0

## 2021-04-26 LAB — SURGICAL PCR SCREEN
MRSA, PCR: NEGATIVE
Staphylococcus aureus: NEGATIVE

## 2021-04-26 LAB — GLUCOSE, CAPILLARY: Glucose-Capillary: 104 mg/dL — ABNORMAL HIGH (ref 70–99)

## 2021-04-26 SURGERY — PACEMAKER IMPLANT

## 2021-04-26 MED ORDER — LIDOCAINE HCL (PF) 1 % IJ SOLN
INTRAMUSCULAR | Status: AC
  Filled 2021-04-26: qty 30

## 2021-04-26 MED ORDER — CEFAZOLIN SODIUM-DEXTROSE 1-4 GM/50ML-% IV SOLN
1.0000 g | Freq: Four times a day (QID) | INTRAVENOUS | Status: AC
  Administered 2021-04-26 – 2021-04-27 (×3): 1 g via INTRAVENOUS
  Filled 2021-04-26 (×4): qty 50

## 2021-04-26 MED ORDER — ACETAMINOPHEN 325 MG PO TABS
325.0000 mg | ORAL_TABLET | ORAL | Status: DC | PRN
  Administered 2021-04-26: 650 mg via ORAL
  Filled 2021-04-26: qty 2

## 2021-04-26 MED ORDER — HEPARIN (PORCINE) IN NACL 1000-0.9 UT/500ML-% IV SOLN
INTRAVENOUS | Status: DC | PRN
  Administered 2021-04-26: 500 mL

## 2021-04-26 MED ORDER — SODIUM CHLORIDE 0.9% FLUSH
3.0000 mL | Freq: Two times a day (BID) | INTRAVENOUS | Status: DC
  Administered 2021-04-26 (×2): 3 mL via INTRAVENOUS

## 2021-04-26 MED ORDER — SODIUM CHLORIDE 0.9 % IV SOLN
INTRAVENOUS | Status: AC
Start: 2021-04-26 — End: ?
  Filled 2021-04-26: qty 2

## 2021-04-26 MED ORDER — HEPARIN (PORCINE) IN NACL 1000-0.9 UT/500ML-% IV SOLN
INTRAVENOUS | Status: AC
  Filled 2021-04-26: qty 500

## 2021-04-26 MED ORDER — OFF THE BEAT BOOK
Freq: Once | Status: AC
  Filled 2021-04-26: qty 1

## 2021-04-26 MED ORDER — SODIUM CHLORIDE 0.9% FLUSH: 3.0000 mL | Freq: Two times a day (BID) | INTRAVENOUS | Status: DC

## 2021-04-26 MED ORDER — HYDRALAZINE HCL 20 MG/ML IJ SOLN
INTRAMUSCULAR | Status: DC | PRN
Start: 2021-04-26 — End: 2021-04-26
  Administered 2021-04-26: 10 mg via INTRAVENOUS

## 2021-04-26 MED ORDER — SODIUM CHLORIDE 0.9% FLUSH: 3.0000 mL | INTRAVENOUS | Status: DC | PRN

## 2021-04-26 MED ORDER — CEFAZOLIN SODIUM-DEXTROSE 2-4 GM/100ML-% IV SOLN
INTRAVENOUS | Status: AC
  Filled 2021-04-26: qty 100

## 2021-04-26 MED ORDER — ONDANSETRON HCL 4 MG/2ML IJ SOLN: 4.0000 mg | Freq: Four times a day (QID) | INTRAMUSCULAR | Status: DC | PRN

## 2021-04-26 MED ORDER — HYDRALAZINE HCL 20 MG/ML IJ SOLN
INTRAMUSCULAR | Status: AC
  Filled 2021-04-26: qty 1

## 2021-04-26 MED ORDER — LIDOCAINE HCL (PF) 1 % IJ SOLN
INTRAMUSCULAR | Status: DC | PRN
  Administered 2021-04-26: 60 mL

## 2021-04-26 SURGICAL SUPPLY — 14 items
CABLE SURGICAL S-101-97-12 (CABLE) ×2 IMPLANT
CATH CPS LOCATOR 3D MED (CATHETERS) ×1 IMPLANT
CPS IMPLANT KIT 410190 (MISCELLANEOUS) ×1 IMPLANT
HELIX LOCKING TOOL (MISCELLANEOUS) ×2
LEAD TENDRIL MRI 52CM LPA1200M (Lead) ×1 IMPLANT
LEAD TENDRIL SDX 2088TC-58CM (Lead) ×1 IMPLANT
PACEMAKER ASSURITY DR-RF (Pacemaker) ×1 IMPLANT
PAD DEFIB RADIO PHYSIO CONN (PAD) ×2 IMPLANT
SHEATH 8FR PRELUDE SNAP 13 (SHEATH) ×1 IMPLANT
SHEATH 9FR PRELUDE SNAP 13 (SHEATH) ×1 IMPLANT
SHEATH PROBE COVER 6X72 (BAG) ×1 IMPLANT
TOOL HELIX LOCKING (MISCELLANEOUS) IMPLANT
TRAY PACEMAKER INSERTION (PACKS) ×2 IMPLANT
WIRE HI TORQ VERSACORE-J 145CM (WIRE) ×1 IMPLANT

## 2021-04-26 NOTE — Discharge Instructions (Signed)
? ? ?  Supplemental Discharge Instructions for  ?Pacemaker/Defibrillator Patients ? ? ?Activity ?No heavy lifting or vigorous activity with your left/right arm for 6 to 8 weeks.  Do not raise your left/right arm above your head for one week.  Gradually raise your affected arm as drawn below. ? ?        ?   05/01/21                    05/02/21                   05/03/21                   05/04/21 ?__ ? ?NO DRIVING until cleared to at your wound check visit ? ?WOUND CARE ?Keep the wound area clean and dry.  Do not get this area wet , no showers until cleared to at your wound check visit ?The tape/steri-strips on your wound will fall off; do not pull them off.  No bandage is needed on the site.  DO  NOT apply any creams, oils, or ointments to the wound area. ?If you notice any drainage or discharge from the wound, any swelling or bruising at the site, or you develop a fever > 101? F after you are discharged home, call the office at once. ? ?Special Instructions ?You are still able to use cellular telephones; use the ear opposite the side where you have your pacemaker/defibrillator.  Avoid carrying your cellular phone near your device. ?When traveling through airports, show security personnel your identification card to avoid being screened in the metal detectors.  Ask the security personnel to use the hand wand. ?Avoid arc welding equipment, MRI testing (magnetic resonance imaging), TENS units (transcutaneous nerve stimulators).  Call the office for questions about other devices. ?Avoid electrical appliances that are in poor condition or are not properly grounded. ?Microwave ovens are safe to be near or to operate. ? ? ?

## 2021-04-26 NOTE — Progress Notes (Addendum)
? ?Progress Note ? ?Patient Name: Brandon Fischer ?Date of Encounter: 04/26/2021 ? ?[facility] Cardiologist: Dr. Einar Gip ? ?Subjective  ? ?Hard to sleep last night, otherwise feels well. ? ?Inpatient Medications  ?  ?Scheduled Meds: ? vitamin C  250 mg Oral Daily  ? atorvastatin  20 mg Oral Daily  ? chlorhexidine  60 mL Topical Once  ? cholecalciferol  1,000 Units Oral Q breakfast  ? gentamicin irrigation  80 mg Irrigation On Call  ? hydrALAZINE  10 mg Oral Q8H  ? off the beat book   Does not apply Once  ? pantoprazole  40 mg Oral Daily  ? sodium chloride flush  3 mL Intravenous Q12H  ? tamsulosin  0.4 mg Oral QPC supper  ? zinc sulfate  220 mg Oral Daily  ? ?Continuous Infusions: ? sodium chloride Stopped (04/25/21 2016)  ? sodium chloride 50 mL/hr at 04/26/21 0646  ? sodium chloride 250 mL (04/26/21 0647)  ?  ceFAZolin (ANCEF) IV    ? ?PRN Meds: ?sodium chloride, sodium chloride flush, traMADol  ? ?Vital Signs  ?  ?Vitals:  ? 04/26/21 0019 04/26/21 0053 04/26/21 0413 04/26/21 0414  ?BP: (!) 201/66 (!) 175/75 (!) 183/74 (!) 183/74  ?Pulse: 77 (!) 59 69   ?Resp: '16 18 19   '$ ?Temp: 98.4 ?F (36.9 ?C) 98.4 ?F (36.9 ?C) 98 ?F (36.7 ?C)   ?TempSrc: Oral Oral Oral   ?SpO2:  99% 99%   ?Weight:      ?Height:      ? ? ?Intake/Output Summary (Last 24 hours) at 04/26/2021 0651 ?Last data filed at 04/25/2021 2359 ?Gross per 24 hour  ?Intake 45.51 ml  ?Output --  ?Net 45.51 ml  ? ? ?  04/25/2021  ?  4:55 PM 04/25/2021  ?  1:44 PM 04/25/2021  ? 11:20 AM  ?Last 3 Weights  ?Weight (lbs) 197 lb 4.8 oz 197 lb 1.5 oz 197 lb  ?Weight (kg) 89.495 kg 89.4 kg 89.359 kg  ?   ? ?Telemetry  ?  ?SR long 1st degree AVblock, Mobitz I and intermittent loss of AV synchrony with CHB, rates though stable 50's-60's - Personally Reviewed ? ?ECG  ?  ?No new EKGs - Personally Reviewed ? ?Physical Exam  ? ?GEN: No acute distress.   ?Neck: No JVD ?Cardiac: RRR, 1-2/6 SM, no rubs, or gallops.  ?Respiratory: CTA b/l. ?GI: Soft, nontender, non-distended  ?MS:  No edema; No deformity. ?Neuro:  Nonfocal  ?Psych: Normal affect  ? ?Labs  ?  ?High Sensitivity Troponin:   ?Recent Labs  ?Lab 04/07/21 ?0809  ?TROPONINIHS 16  ?   ?Chemistry ?Recent Labs  ?Lab 04/25/21 ?1343 04/25/21 ?1440  ?NA 143 143  ?K 4.0 3.9  ?CL 110 111  ?CO2 27 24  ?GLUCOSE 112* 108*  ?BUN 13 14  ?CREATININE 1.39* 1.39*  ?CALCIUM 8.2* 8.4*  ?PROT 5.5* 5.9*  ?ALBUMIN 3.4* 3.5  ?AST 13* 16  ?ALT 16 18  ?ALKPHOS 118 120  ?BILITOT 1.5* 1.5*  ?GFRNONAA 49* 49*  ?ANIONGAP 6 8  ?  ?Lipids No results for input(s): CHOL, TRIG, HDL, LABVLDL, LDLCALC, CHOLHDL in the last 168 hours.  ?Hematology ?Recent Labs  ?Lab 04/25/21 ?1343 04/25/21 ?1440  ?WBC 5.6 5.4  ?RBC 3.54* 3.57*  ?HGB 9.0* 9.1*  ?HCT 29.8* 30.3*  ?MCV 84.2 84.9  ?MCH 25.4* 25.5*  ?MCHC 30.2 30.0  ?RDW 17.6* 17.5*  ?PLT 264 248  ? ?Thyroid  ?Recent Labs  ?Lab 04/25/21 ?  1455  ?TSH 3.659  ?  ?BNPNo results for input(s): BNP, PROBNP in the last 168 hours.  ?DDimer No results for input(s): DDIMER in the last 168 hours.  ? ?Radiology  ?  ?No results found. ? ?Cardiac Studies  ? ?Echocardiogram 06/19/2020: ?Normal LV systolic function with visual EF 55-60%. Left ventricle cavity is normal in size. Moderate left ventricular hypertrophy. Normal global wall motion. Doppler evidence of grade II (pseudonormal) diastolic dysfunction, elevated LAP. ?Left atrial cavity is severely dilated. ?Mild (Grade I) aortic regurgitation. ?Mild (Grade I) mitral regurgitation. ?Mild tricuspid regurgitation. Mild pulmonary hypertension. RVSP measures 42 mmHg. ?IVC is dilated with a respiratory response of <50%. ?No prior study for comparison. ?  ?Carotid artery duplex 06/19/2020: ?Stenosis in the right internal carotid artery (1-15%). Stenosis in the right carotid bulb of <50%. Stenosis in the right external carotid artery (<50%). ?Stenosis in the left internal carotid artery (16-49%). Stenosis in the left external carotid artery (<50%). ?There is diffuse heterogeneous plaque in the  bilateral carotid arteries. ?Antegrade right vertebral artery flow. Antegrade left vertebral artery flow. ?Compared to 11/02/2014, there was minimal plaque bilaterally. Follow up in one year is appropriate if clinically indicated. ? ?Patient Profile  ?   ?88 y.o. male HTN, HLD, SVT (ablated 2016), CKD (IIIa),  w/unexplained abrupt syncope 2 weeks ago and noted advancing conduction system disease in the office yesterday by Dr. Einar Gip, including CHB, admitted for EP evaluation and likely PPM ? ?Assessment & Plan  ?  ?Advanced heart block ?Symptomatic bradycardia with a syncopal episode a couple weeks ago ?No reversible causes ? ? ?Planned for PPM today ?Discussed yesterday with daughter as well ?Re-discussed procedure, potential risks/benefits, he remains agreeable ? ?3.  HTN ?Attending cardiology team is addressing ? ? ?For questions or updates, please contact [place] ?Please consult [email or web address] for contact info under  ? ?  ?   ?Signed, ?Baldwin Jamaica, PA-C  ?04/26/2021, 6:51 AM   ? ?I have seen and examined this patient with Tommye Standard.  Agree with above, note added to reflect my findings.  Feeling well though didn't sleep well last night. No further episodes of syncope. ? ?GEN: Well nourished, well developed, in no acute distress  ?HEENT: normal  ?Neck: no JVD, carotid bruits, or masses ?Cardiac: RRR; no murmurs, rubs, or gallops,no edema  ?Respiratory:  clear to auscultation bilaterally, normal work of breathing ?GI: soft, nontender, nondistended, + BS ?MS: no deformity or atrophy  ?Skin: warm and dry ?Neuro:  Strength and sensation are intact ?Psych: euthymic mood, full affect  ? ?Syncope, heart block: potentially the cause of his episode of syncope. Due to that Brandon Fischer plan for pacemaker implant. Risks and benefits discussed. The patient understand the risks and has agreed to the procedure. ? ?Brandon Fischer has presented today for surgery, with the diagnosis of syncope, intermittent complete heart  block.  The various methods of treatment have been discussed with the patient and family. After consideration of risks, benefits and other options for treatment, the patient has consented to  Procedure(s): ?Pacemaker implant as a surgical intervention .  Risks include but not limited to bleeding, infection, pneumothorax, perforation, tamponade, vascular damage, renal failure, MI, stroke, death, and lead dislodgement . The patient's history has been reviewed, patient examined, no change in status, stable for surgery.  I have reviewed the patient's chart and labs.  Questions were answered to the patient's satisfaction.   ? ?Allegra Lai, MD ?04/26/2021 ?7:57 AM   ? ?

## 2021-04-26 NOTE — H&P (View-Only) (Signed)
? ?Progress Note ? ?Patient Name: Brandon Fischer ?Date of Encounter: 04/26/2021 ? ?[facility] Cardiologist: Dr. Einar Gip ? ?Subjective  ? ?Hard to sleep last night, otherwise feels well. ? ?Inpatient Medications  ?  ?Scheduled Meds: ? vitamin C  250 mg Oral Daily  ? atorvastatin  20 mg Oral Daily  ? chlorhexidine  60 mL Topical Once  ? cholecalciferol  1,000 Units Oral Q breakfast  ? gentamicin irrigation  80 mg Irrigation On Call  ? hydrALAZINE  10 mg Oral Q8H  ? off the beat book   Does not apply Once  ? pantoprazole  40 mg Oral Daily  ? sodium chloride flush  3 mL Intravenous Q12H  ? tamsulosin  0.4 mg Oral QPC supper  ? zinc sulfate  220 mg Oral Daily  ? ?Continuous Infusions: ? sodium chloride Stopped (04/25/21 2016)  ? sodium chloride 50 mL/hr at 04/26/21 0646  ? sodium chloride 250 mL (04/26/21 0647)  ?  ceFAZolin (ANCEF) IV    ? ?PRN Meds: ?sodium chloride, sodium chloride flush, traMADol  ? ?Vital Signs  ?  ?Vitals:  ? 04/26/21 0019 04/26/21 0053 04/26/21 0413 04/26/21 0414  ?BP: (!) 201/66 (!) 175/75 (!) 183/74 (!) 183/74  ?Pulse: 77 (!) 59 69   ?Resp: '16 18 19   '$ ?Temp: 98.4 ?F (36.9 ?C) 98.4 ?F (36.9 ?C) 98 ?F (36.7 ?C)   ?TempSrc: Oral Oral Oral   ?SpO2:  99% 99%   ?Weight:      ?Height:      ? ? ?Intake/Output Summary (Last 24 hours) at 04/26/2021 0651 ?Last data filed at 04/25/2021 2359 ?Gross per 24 hour  ?Intake 45.51 ml  ?Output --  ?Net 45.51 ml  ? ? ?  04/25/2021  ?  4:55 PM 04/25/2021  ?  1:44 PM 04/25/2021  ? 11:20 AM  ?Last 3 Weights  ?Weight (lbs) 197 lb 4.8 oz 197 lb 1.5 oz 197 lb  ?Weight (kg) 89.495 kg 89.4 kg 89.359 kg  ?   ? ?Telemetry  ?  ?SR long 1st degree AVblock, Mobitz I and intermittent loss of AV synchrony with CHB, rates though stable 50's-60's - Personally Reviewed ? ?ECG  ?  ?No new EKGs - Personally Reviewed ? ?Physical Exam  ? ?GEN: No acute distress.   ?Neck: No JVD ?Cardiac: RRR, 1-2/6 SM, no rubs, or gallops.  ?Respiratory: CTA b/l. ?GI: Soft, nontender, non-distended  ?MS:  No edema; No deformity. ?Neuro:  Nonfocal  ?Psych: Normal affect  ? ?Labs  ?  ?High Sensitivity Troponin:   ?Recent Labs  ?Lab 04/07/21 ?0809  ?TROPONINIHS 16  ?   ?Chemistry ?Recent Labs  ?Lab 04/25/21 ?1343 04/25/21 ?1440  ?NA 143 143  ?K 4.0 3.9  ?CL 110 111  ?CO2 27 24  ?GLUCOSE 112* 108*  ?BUN 13 14  ?CREATININE 1.39* 1.39*  ?CALCIUM 8.2* 8.4*  ?PROT 5.5* 5.9*  ?ALBUMIN 3.4* 3.5  ?AST 13* 16  ?ALT 16 18  ?ALKPHOS 118 120  ?BILITOT 1.5* 1.5*  ?GFRNONAA 49* 49*  ?ANIONGAP 6 8  ?  ?Lipids No results for input(s): CHOL, TRIG, HDL, LABVLDL, LDLCALC, CHOLHDL in the last 168 hours.  ?Hematology ?Recent Labs  ?Lab 04/25/21 ?1343 04/25/21 ?1440  ?WBC 5.6 5.4  ?RBC 3.54* 3.57*  ?HGB 9.0* 9.1*  ?HCT 29.8* 30.3*  ?MCV 84.2 84.9  ?MCH 25.4* 25.5*  ?MCHC 30.2 30.0  ?RDW 17.6* 17.5*  ?PLT 264 248  ? ?Thyroid  ?Recent Labs  ?Lab 04/25/21 ?  1455  ?TSH 3.659  ?  ?BNPNo results for input(s): BNP, PROBNP in the last 168 hours.  ?DDimer No results for input(s): DDIMER in the last 168 hours.  ? ?Radiology  ?  ?No results found. ? ?Cardiac Studies  ? ?Echocardiogram 06/19/2020: ?Normal LV systolic function with visual EF 55-60%. Left ventricle cavity is normal in size. Moderate left ventricular hypertrophy. Normal global wall motion. Doppler evidence of grade II (pseudonormal) diastolic dysfunction, elevated LAP. ?Left atrial cavity is severely dilated. ?Mild (Grade I) aortic regurgitation. ?Mild (Grade I) mitral regurgitation. ?Mild tricuspid regurgitation. Mild pulmonary hypertension. RVSP measures 42 mmHg. ?IVC is dilated with a respiratory response of <50%. ?No prior study for comparison. ?  ?Carotid artery duplex 06/19/2020: ?Stenosis in the right internal carotid artery (1-15%). Stenosis in the right carotid bulb of <50%. Stenosis in the right external carotid artery (<50%). ?Stenosis in the left internal carotid artery (16-49%). Stenosis in the left external carotid artery (<50%). ?There is diffuse heterogeneous plaque in the  bilateral carotid arteries. ?Antegrade right vertebral artery flow. Antegrade left vertebral artery flow. ?Compared to 11/02/2014, there was minimal plaque bilaterally. Follow up in one year is appropriate if clinically indicated. ? ?Patient Profile  ?   ?88 y.o. male HTN, HLD, SVT (ablated 2016), CKD (IIIa),  w/unexplained abrupt syncope 2 weeks ago and noted advancing conduction system disease in the office yesterday by Dr. Einar Gip, including CHB, admitted for EP evaluation and likely PPM ? ?Assessment & Plan  ?  ?Advanced heart block ?Symptomatic bradycardia with a syncopal episode a couple weeks ago ?No reversible causes ? ? ?Planned for PPM today ?Discussed yesterday with daughter as well ?Re-discussed procedure, potential risks/benefits, he remains agreeable ? ?3.  HTN ?Attending cardiology team is addressing ? ? ?For questions or updates, please contact [place] ?Please consult [email or web address] for contact info under  ? ?  ?   ?Signed, ?Baldwin Jamaica, PA-C  ?04/26/2021, 6:51 AM   ? ?I have seen and examined this patient with Tommye Standard.  Agree with above, note added to reflect my findings.  Feeling well though didn't sleep well last night. No further episodes of syncope. ? ?GEN: Well nourished, well developed, in no acute distress  ?HEENT: normal  ?Neck: no JVD, carotid bruits, or masses ?Cardiac: RRR; no murmurs, rubs, or gallops,no edema  ?Respiratory:  clear to auscultation bilaterally, normal work of breathing ?GI: soft, nontender, nondistended, + BS ?MS: no deformity or atrophy  ?Skin: warm and dry ?Neuro:  Strength and sensation are intact ?Psych: euthymic mood, full affect  ? ?Syncope, heart block: potentially the cause of his episode of syncope. Due to that Analisia Kingsford plan for pacemaker implant. Risks and benefits discussed. The patient understand the risks and has agreed to the procedure. ? ?MONTRELLE EDDINGS has presented today for surgery, with the diagnosis of syncope, intermittent complete heart  block.  The various methods of treatment have been discussed with the patient and family. After consideration of risks, benefits and other options for treatment, the patient has consented to  Procedure(s): ?Pacemaker implant as a surgical intervention .  Risks include but not limited to bleeding, infection, pneumothorax, perforation, tamponade, vascular damage, renal failure, MI, stroke, death, and lead dislodgement . The patient's history has been reviewed, patient examined, no change in status, stable for surgery.  I have reviewed the patient's chart and labs.  Questions were answered to the patient's satisfaction.   ? ?Allegra Lai, MD ?04/26/2021 ?7:57 AM   ? ?

## 2021-04-26 NOTE — Interval H&P Note (Signed)
History and Physical Interval Note: ? ?04/26/2021 ?7:58 AM ? ?Brandon Fischer  has presented today for surgery, with the diagnosis of complete heart block.  The various methods of treatment have been discussed with the patient and family. After consideration of risks, benefits and other options for treatment, the patient has consented to  Procedure(s): ?PACEMAKER IMPLANT (N/A) as a surgical intervention.  The patient's history has been reviewed, patient examined, no change in status, stable for surgery.  I have reviewed the patient's chart and labs.  Questions were answered to the patient's satisfaction.   ? ? ?Brandon Fischer Brandon Fischer ? ? ?

## 2021-04-27 ENCOUNTER — Inpatient Hospital Stay (HOSPITAL_COMMUNITY): Payer: Medicare Other

## 2021-04-27 MED ORDER — METOPROLOL SUCCINATE ER 25 MG PO TB24: 25.0000 mg | ORAL_TABLET | Freq: Every day | ORAL | 2 refills | Status: DC

## 2021-04-27 NOTE — Discharge Summary (Signed)
?Physician Discharge Summary  ?Patient ID: ?Brandon Fischer ?MRN: [id number] ?DOB/AGE: 86-Jun-1935 86 y.o. ?Brandon Pretty, MD  ? ?Admit date: 04/25/2021 ?Discharge date: 04/27/2021 ? ?Primary Discharge Diagnosis ? ? ?  ICD-10-CM   ?1. Heart block AV third degree (HCC)  I44.2   ?  ?2. Syncope and collapse  R55 0  ?3.  Primary hypertension    ?    ?    ?  ? ? ?Significant Diagnostic Studies: ? ?Pacemaker implantation 04/26/2021:   ?1. Successful implantation of a Tiro MRI dual-chamber pacemaker for symptomatic bradycardia ? 2. No early apparent complications.  ? ?Radiology: ? ?DG Chest 1 View ? ?Result Date: 04/07/2021 ?CLINICAL DATA:  Pt BIB EMS from home after a fall and hit his head on the counter. Pt had a 1 inch laceration over his right eye. Skin tears noted to right knuckles, right lateral hand, left lateral elbow, and left anterior knee. Pt complained of right shoulder pain. EXAM: CHEST  1 VIEW COMPARISON:  08/09/2020 FINDINGS: Cardiac silhouette is normal in size. No mediastinal or hilar masses. Clear lungs. No pleural effusion or pneumothorax. Skeletal structures are grossly intact. IMPRESSION: No active disease. Electronically Signed   By: Brandon Fischer M.D.   On: 04/07/2021 08:36  ? ?DG Chest 2 View ? ?Result Date: 04/27/2021 ?CLINICAL DATA:  status post pacer placement EXAM: CHEST - 2 VIEW COMPARISON:  04/07/2021. FINDINGS: Interval placement of left chest wall pacer device. The leads are in the right atrial appendage and right ventricle. Stable cardiomediastinal contours. Aortic atherosclerotic calcifications. No pneumothorax identified. Lungs are clear. No pleural effusion or edema. IMPRESSION: Interval placement of left chest wall pacer device. No pneumothorax. Electronically Signed   By: Brandon Fischer M.D.   On: 04/27/2021 09:31  ? ? ?Hospital Course: Brandon Fischer is a 88 y.o. male  patient Caucasian male with hypertension, hyperlipidemia, SVT ablation in 2016, stage IIIa chronic  kidney disease, first-degree AV block by EKG, bilateral carotid bruit, first-degree block on the EKG.   ?  ?Patient was seen in the emergency room on 04/07/2021 with a fall, felt to be mechanical, EKG activated first-degree AV block and also intermittent Mobitz 1 AV block.  On further questioning, the episode occurred without any premonitory symptoms, suggestive of probable cardiac syncope.  He was seen in the office on 04/26/2021, EKG revealed complete heart block.  Although he has not had any further episodes of syncope, he was felt to be high risk for recurrence of syncope, injury to personal self, hence admitted to the hospital on an urgent basis.  He was evaluated by EP, underwent pacemaker implantation by Dr. Allegra Fischer by implantation of a [facility] Jude medical Assurity MRI safe dual-chamber pacemaker on 04/26/2021.  The following day patient was asymptomatic, chest x-ray revealed no evidence of pneumothorax, device interrogation reviewed, no evidence of atrial fibrillation and V paced 92% of the time, evaluated by EP and felt stable for discharge with outpatient follow-up. ? ?Recommendations on discharge: Patient will continue on home medications for hypertension.  Initially blood pressure was markedly elevated, suspect this is probably due to bradycardia related to complete heart block and junctional rhythm, with pacing, blood pressure is now normal and well controlled.  He continues to have PVCs on telemetry and therefore we will start Toprol-XL 25 mg p.o. daily.  This will be reevaluated at his office visit.  No change in his physical exam.  Pacemaker pocket without any hematoma. ? ?Discharge Exam: ? ?  04/27/2021  ? 12:12 PM 04/27/2021  ?  9:25 AM 04/27/2021  ?  6:24 AM  ?Vitals with BMI  ?Weight   171 lbs 12 oz  ?BMI   24.64  ?Systolic 947 654 650  ?Diastolic 50 59 70  ?Pulse 90 90 41  ?  ? ?Physical Exam ?Neck:  ?   Vascular: No JVD.  ?Cardiovascular:  ?   Rate and Rhythm: Normal rate. Rhythm irregular.  ?    Pulses: Normal pulses and intact distal pulses.     ?     Carotid pulses are  on the right side with bruit and  on the left side with bruit. ?   Heart sounds: S1 normal and S2 normal. Murmur heard.  ?Crescendo systolic murmur is present with a grade of 2/6 at the upper right sternal border.  ?Early diastolic murmur is present with a grade of 2/4 at the upper right sternal border.  ?  No gallop.  ?Pulmonary:  ?   Effort: Pulmonary effort is normal.  ?   Breath sounds: Normal breath sounds.  ?   Comments: Pacemaker/ICD site noted  in the left infraclavicular fossa.  ? ?Abdominal:  ?   General: Bowel sounds are normal.  ?   Palpations: Abdomen is soft.  ?Musculoskeletal:  ?   Right lower leg: No edema.  ?   Left lower leg: No edema.  ? ? ?Labs: ?  ?Lab Results  ?Component Value Date  ? WBC 5.4 04/25/2021  ? HGB 9.1 (L) 04/25/2021  ? HCT 30.3 (L) 04/25/2021  ? MCV 84.9 04/25/2021  ? PLT 248 04/25/2021  ?  ?Recent Labs  ?Lab 04/25/21 ?1440  ?NA 143  ?K 3.9  ?CL 111  ?CO2 24  ?BUN 14  ?CREATININE 1.39*  ?CALCIUM 8.4*  ?PROT 5.9*  ?BILITOT 1.5*  ?ALKPHOS 120  ?ALT 18  ?AST 16  ?GLUCOSE 108*  ? ? ?Lipid Panel  ?No results found for: CHOL, TRIG, HDL, CHOLHDL, VLDL, LDLCALC ? ?BNP (last 3 results) ?No results for input(s): BNP in the last 8760 hours. ? ?HEMOGLOBIN A1C ?No results found for: HGBA1C, MPG ? ?Cardiac Panel (last 3 results) ?No results for input(s): CKTOTAL, CKMB, TROPONINIHS, RELINDX in the last 72 hours.  ? ?TSH ?Recent Labs  ?  04/07/21 ?0922 04/25/21 ?1455  ?TSH 2.920 3.659  ? ? ?FOLLOW UP PLANS AND APPOINTMENTS ? ?Allergies as of 04/27/2021   ? ?   Reactions  ? Bupropion   ? Other reaction(s): disoriented  ? Olmesartan   ? Other reaction(s): rash  ? Trazodone   ? Other reaction(s): unsteady gait  ? ?  ? ?  ?Medication List  ?  ? ?TAKE these medications   ? ?aspirin 81 MG chewable tablet ?Commonly known as: Aspirin Childrens ?Chew 1 tablet (81 mg total) by mouth daily. ?  ?atorvastatin 20 MG tablet ?Commonly  known as: LIPITOR ?Take 20 mg by mouth daily. ?  ?hydrALAZINE 10 MG tablet ?Commonly known as: APRESOLINE ?Take 1 tablet (10 mg total) by mouth 3 (three) times daily. ?  ?losartan 50 MG tablet ?Commonly known as: COZAAR ?Take 50 mg by mouth daily. ?  ?metoprolol succinate 25 MG 24 hr tablet ?Commonly known as: Toprol XL ?Take 1 tablet (25 mg total) by mouth daily. ?  ?pantoprazole 40 MG tablet ?Commonly known as: PROTONIX ?Take 40 mg by mouth in the morning and at bedtime. ?  ?tamsulosin 0.4 MG Caps capsule ?Commonly known as: FLOMAX ?Take 0.4  mg by mouth daily after supper. ?  ?traMADol 50 MG tablet ?Commonly known as: ULTRAM ?Take 1 tablet (50 mg total) by mouth every 6 (six) hours as needed for moderate pain or severe pain. Post-operatively ?  ?VITAMIN C PO ?Take 1 tablet by mouth daily. ?  ?Vitamin D-3 25 MCG (1000 UT) Caps ?Take 1,000 Units by mouth daily with breakfast. ?  ?zinc gluconate 50 MG tablet ?Take 50 mg by mouth daily. ?  ? ?  ? ? Follow-up Information   ? ? Cantwell, Celeste C, PA-C Follow up on 05/03/2021.   ?Specialty: Cardiology ?Why: May 03 2021 at 11:30. Bring all medications ?Contact information: ?[address] ?[address] ?[place] [address] ?[phone] ? ? ?  ?  ? ?  ?  ? ?  ? ?Time spent: 33 minutes. ? ?Rex Kras, DO, FACC ? ?Pager: [phone] ?Office: [phone] ? ?

## 2021-04-27 NOTE — Progress Notes (Signed)
? ?Progress Note ? ?Patient Name: Brandon Fischer ?Date of Encounter: 04/27/2021 ? ?[facility] Cardiologist: Dr. Einar Gip ? ?Subjective  ? ?Patient admitted with syncope and heart block.  Underwent pacemaker implantation 4/14 ?No chest pain or shortness of breath ? ?Inpatient Medications  ?  ?Scheduled Meds: ? vitamin C  250 mg Oral Daily  ? atorvastatin  20 mg Oral Daily  ? cholecalciferol  1,000 Units Oral Q breakfast  ? hydrALAZINE  10 mg Oral Q8H  ? pantoprazole  40 mg Oral Daily  ? sodium chloride flush  3 mL Intravenous Q12H  ? sodium chloride flush  3 mL Intravenous Q12H  ? sodium chloride flush  3 mL Intravenous Q12H  ? tamsulosin  0.4 mg Oral QPC supper  ? zinc sulfate  220 mg Oral Daily  ? ?Continuous Infusions: ? sodium chloride 10 mL/hr at 04/26/21 2237  ? ?PRN Meds: ?sodium chloride, acetaminophen, ondansetron (ZOFRAN) IV, sodium chloride flush, traMADol  ? ?Vital Signs  ?  ?Vitals:  ? 04/27/21 0436 04/27/21 0624 04/27/21 0925 04/27/21 1212  ?BP: (!) 163/68 (!) 160/70 (!) 119/59 (!) 138/50  ?Pulse: 66 (!) 41 90 90  ?Resp: '17 19 20 17  '$ ?Temp: 98.5 ?F (36.9 ?C)  98.6 ?F (37 ?C) 98.4 ?F (36.9 ?C)  ?TempSrc: Oral  Oral Oral  ?SpO2: 97% 97% 98% 98%  ?Weight:  77.9 kg    ?Height:      ? ? ?Intake/Output Summary (Last 24 hours) at 04/27/2021 1230 ?Last data filed at 04/26/2021 2237 ?Gross per 24 hour  ?Intake 293 ml  ?Output 400 ml  ?Net -107 ml  ? ? ? ?  04/27/2021  ?  6:24 AM 04/25/2021  ?  4:55 PM 04/25/2021  ?  1:44 PM  ?Last 3 Weights  ?Weight (lbs) 171 lb 11.8 oz 197 lb 4.8 oz 197 lb 1.5 oz  ?Weight (kg) 77.9 kg 89.495 kg 89.4 kg  ?   ? ?Telemetry  ?  ?SR long 1st degree AVblock, Mobitz I and intermittent loss of AV synchrony with CHB, rates though stable 50's-60's - Personally Reviewed ? ?ECG  ?  ?No new EKGs - Personally Reviewed ? ?Physical Exam  ? ?Well developed and nourished in no acute distress ?HENT normal ?Neck supple   ?Clear ?Regular rate and rhythm,   ?Pocket without  hematoma, swelling or  tenderness  ?Abd-soft with active BS ?No Clubbing cyanosis edema ?Skin-warm and dry ?A & Oriented  Grossly normal sensory and motor function ? ?  ? ?Labs  ?  ?High Sensitivity Troponin:   ?Recent Labs  ?Lab 04/07/21 ?0809  ?TROPONINIHS 16  ? ?   ?Chemistry ?Recent Labs  ?Lab 04/25/21 ?1343 04/25/21 ?1440  ?NA 143 143  ?K 4.0 3.9  ?CL 110 111  ?CO2 27 24  ?GLUCOSE 112* 108*  ?BUN 13 14  ?CREATININE 1.39* 1.39*  ?CALCIUM 8.2* 8.4*  ?PROT 5.5* 5.9*  ?ALBUMIN 3.4* 3.5  ?AST 13* 16  ?ALT 16 18  ?ALKPHOS 118 120  ?BILITOT 1.5* 1.5*  ?GFRNONAA 49* 49*  ?ANIONGAP 6 8  ? ?  ?Lipids No results for input(s): CHOL, TRIG, HDL, LABVLDL, LDLCALC, CHOLHDL in the last 168 hours.  ?Hematology ?Recent Labs  ?Lab 04/25/21 ?1343 04/25/21 ?1440  ?WBC 5.6 5.4  ?RBC 3.54* 3.57*  ?HGB 9.0* 9.1*  ?HCT 29.8* 30.3*  ?MCV 84.2 84.9  ?MCH 25.4* 25.5*  ?MCHC 30.2 30.0  ?RDW 17.6* 17.5*  ?PLT 264 248  ? ? ?Thyroid  ?  Recent Labs  ?Lab 04/25/21 ?1455  ?TSH 3.659  ? ?  ?BNPNo results for input(s): BNP, PROBNP in the last 168 hours.  ?DDimer No results for input(s): DDIMER in the last 168 hours.  ? ?Radiology  ?  ?DG Chest 2 View ? ?Result Date: 04/27/2021 ?CLINICAL DATA:  status post pacer placement EXAM: CHEST - 2 VIEW COMPARISON:  04/07/2021. FINDINGS: Interval placement of left chest wall pacer device. The leads are in the right atrial appendage and right ventricle. Stable cardiomediastinal contours. Aortic atherosclerotic calcifications. No pneumothorax identified. Lungs are clear. No pleural effusion or edema. IMPRESSION: Interval placement of left chest wall pacer device. No pneumothorax. Electronically Signed   By: Kerby Moors M.D.   On: 04/27/2021 09:31  ? ?EP PPM/ICD IMPLANT ? ?Result Date: 04/26/2021 ?SURGEON:  Will Camnitz, MD   PREPROCEDURE DIAGNOSIS:  syncope, heart block   POSTPROCEDURE DIAGNOSIS:  syncope, heart block    PROCEDURES:  1. Pacemaker implantation.    INTRODUCTION:  Brandon Fischer is a 88 y.o. male with a history of  bradycardia who presents today for pacemaker implantation.  The patient reports intermittent episodes of dizziness over the past few months.  No reversible causes have been identified.  The patient therefore presents today for pacemaker implantation.   DESCRIPTION OF PROCEDURE:  Informed written consent was obtained, and  the patient was brought to the electrophysiology lab in a fasting state.  The patient required no sedation for the procedure today.  The patients left chest was prepped and draped in the usual sterile fashion by the EP lab staff. The skin overlying the left deltopectoral region was infiltrated with lidocaine for local analgesia.  A 4-cm incision was made over the left deltopectoral region.  A left subcutaneous pacemaker pocket was fashioned using a combination of sharp and blunt dissection. Electrocautery was required to assure hemostasis.  RA/RV Lead Placement: The left axillary vein was therefore cannulated.  Through the left axillary vein, a [employer] model Tendril MRI [id number] (serial number  [id number]) right atrial lead and an [employer] model Tendril STS [id number] (serial number  [id number]) left bundle lead were advanced with fluoroscopic visualization into the right atrial appendage and left bundle apex positions respectively.  Initial atrial lead P- waves measured 5.9 mV with impedance of 651 ohms and a threshold of 0.7 V at 0.5 msec.  Right ventricular lead R-waves measured 6 mV with an impedance of 608 ohms and a threshold of 0.8 V at 0.5 msec.  Both leads were secured to the pectoralis fascia using #2-0 silk over the suture sleeves. Device Placement:  The leads were then connected to an Bolt MRI  model [id number] (serial number  [id number] ) pacemaker.  The pocket was irrigated with copious gentamicin solution.  The pacemaker was then placed into the pocket.  The pocket was then closed in 3 layers with 2.0 Vicryl suture for the 3.0 Vicryl suture subcutaneous and  subcuticular layers.  Steri-  Strips and a sterile dressing were then applied. EBL<13m.  There were no early apparent complications.   CONCLUSIONS:  1. Successful implantation of a [employer] Jude Medical Assurity MRI dual-chamber pacemaker for symptomatic bradycardia  2. No early apparent complications.       Will CCurt Bears MD 04/26/2021 11:12 AM   ? ?Cardiac Studies  ? ?Echocardiogram 06/19/2020: ?Normal LV systolic function with visual EF 55-60%. Left ventricle cavity is normal in size. Moderate left ventricular hypertrophy. Normal global wall motion. Doppler evidence of grade  II (pseudonormal) diastolic dysfunction, elevated LAP. ?Left atrial cavity is severely dilated. ?Mild (Grade I) aortic regurgitation. ?Mild (Grade I) mitral regurgitation. ?Mild tricuspid regurgitation. Mild pulmonary hypertension. RVSP measures 42 mmHg. ?IVC is dilated with a respiratory response of <50%. ?No prior study for comparison. ?  ?Carotid artery duplex 06/19/2020: ?Stenosis in the right internal carotid artery (1-15%). Stenosis in the right carotid bulb of <50%. Stenosis in the right external carotid artery (<50%). ?Stenosis in the left internal carotid artery (16-49%). Stenosis in the left external carotid artery (<50%). ?There is diffuse heterogeneous plaque in the bilateral carotid arteries. ?Antegrade right vertebral artery flow. Antegrade left vertebral artery flow. ?Compared to 11/02/2014, there was minimal plaque bilaterally. Follow up in one year is appropriate if clinically indicated. ? ?Patient Profile  ?   ?88 y.o. male HTN, HLD, SVT (ablated 2016), CKD (IIIa),  w/unexplained abrupt syncope 2 weeks ago and noted advancing conduction system disease in the office yesterday by Dr. Einar Gip, including CHB, admitted for EP evaluation and likely PPM ? ?Assessment & Plan  ?  ?Complete heart block ?PVCs-frequent ?Pacemaker-[employer] Jude ?Hypertension ? ? ?Device function is normal   ?Instructions given ?Contact Dr. Einar Gip ? ? ?For questions  or updates, please contact [place] ?Please consult [email or web address] for contact info under  ? ?  ?   ?Signed, ?Virl Axe, MD  ?04/27/2021, 12:30 PM   ?  ?04/27/2021 ?12:30 PM   ? ?

## 2021-04-29 ENCOUNTER — Telehealth: Payer: Self-pay

## 2021-04-29 DIAGNOSIS — Z20822 Contact with and (suspected) exposure to covid-19: Secondary | ICD-10-CM | POA: Diagnosis not present

## 2021-04-29 NOTE — Telephone Encounter (Signed)
Location of hospitalization: [facility]. ?Reason for hospitalization: Was sent from our office to have a pacemaker placement. ?Date of discharge: 04/27/2021 ?Date of first communication with patient: today ?Person contacting patient: Gaye Alken, [place] ?Current symptoms: Fatigue ?Do you understand why you were in the Hospital: Yes ?Questions regarding discharge instructions: None ?Where were you discharged to: Home ?Medications reviewed: Yes ?Allergies reviewed: Yes ?Dietary changes reviewed: Yes. Discussed low fat and low salt diet.  ?Referals reviewed: NA ?Activities of Daily Living: Able to with mild limitations ?Any transportation issues/concerns: None ?Any patient concerns: None ?Confirmed importance & date/time of Follow up appt: Yes ?Confirmed with patient if condition begins to worsen call. Pt was given the office number and encouraged to call back with questions or concerns: Yes  ? ?

## 2021-05-02 NOTE — Progress Notes (Signed)
? ?Primary Physician/Referring:  Deland Pretty, MD ? ?Patient ID: Brandon Fischer, male    DOB: 10-07-1933, 88 y.o.   MRN: [id number] ? ?Chief Complaint  ?Patient presents with  ? Follow-up  ? Hypertension  ? ?HPI:   ? ?Brandon Fischer  is a 88 y.o. Caucasian male with hypertension, hyperlipidemia, SVT ablation in 2016, stage IIIa chronic kidney disease, first-degree AV block by EKG, bilateral carotid bruit, first-degree block on the EKG.   ? ?Patient was last seen in the office 04/26/2021 at which time EKG revealed complete heart block.  Patient was subsequently admitted to the hospital and underwent implantation of dual-chamber pacemaker by Dr. Curt Bears on 04/26/2021.  Hospitalization patient had PVCs on telemetry, he was therefore discharged on Toprol-XL 25 mg p.o. daily. Patient now presents for follow up. Patient has had no recurrence of syncope or near syncope, no recurrence of falls. Patient's son-in-law is present at bedside and states that patient has been progressively more independent since discharge.  ? ?Denies chest pain or dyspnea. ? ?Past Medical History:  ?Diagnosis Date  ? Bilateral carotid artery stenosis without cerebral infarction   ? ASYMPTOMATIC--  BILATERAL ICA  50-69%  PER CARDIOLOGIST NOTE, DR Einar Gip  ? Bilateral carotid bruits   ? LEFT  soft bruit, right no no carotid bruit per 07-24-2014 dr Einar Gip note  ? CKD (chronic kidney disease)   ? dr foster [place] kidney, ckd stage 3 per lov 02-16-2019  ? Complete heart block ([place]) 04/25/2021  ? Complication of anesthesia   ? 1 episode atrial fib 02-09-2014 [facility] and followed up with cardiology, not further issue  ? Coronary artery disease   ? NON-OBSTRUCTIVE CAD  per cath 08-06-2004 HX PALPITATIONS-AND -TACHYCARDIA-  ? Encounter for care of pacemaker 04/26/2021  ? First degree heart block   ? Full dentures   ? GERD (gastroesophageal reflux disease)   ? Heart palpitations   ? Not any more  ? History of atrial fibrillation without current medication   ?  EPISODE OF AFIB WITH RVR INTRAOPERATIVELY 02-09-2014 AT [facility]--  RESOLVED AND PT FOLLOWED UP WITH CARDIOLOGIST  DR Einar Gip  ? History of kidney stones   ? Hyperlipidemia   ? Hypertension   ? Nephrolithiasis   ? RIGHT  ? Organic impotence   ? Pacemaker: [place] DR-RF - [id number] 04/26/2021  ? Psoriasis   ? Clearing up  ? PSVT (paroxysmal supraventricular tachycardia) ([place])   ? Right ureteral calculus   ? S/P radiofrequency ablation operation for arrhythmia-SVT 04/28/2014  ? Simple renal cyst   ? right  ? Wears glasses   ? Wears hearing aid   ? BILATERAL  ? ?Past Surgical History:  ?Procedure Laterality Date  ? CARDIAC CATHETERIZATION  08-06-2004 dr Verlon Setting  ? (abnormal stress test) Non-obstructive CAD, pLAD 20-30%,  LCX 30-40%, pRCA 20-30%, dRCA 40%, distal LM  mild diffuse calcifation 20-30% tapering stenosis,  preserved LVSF ef 55-60%  ? CARPAL TUNNEL RELEASE Bilateral 2005  ? CATARACT EXTRACTION, BILATERAL Bilateral 2011  ? CHOLECYSTECTOMY OPEN  1982  ? and NEPHROLITHOTOMY  ? COLONOSCOPY W/ POLYPECTOMY  last one 04-05-2008  ? CYSTOSCOPY WITH LITHOLAPAXY Right 04/14/2017  ? Procedure: CYSTOSCOPY WITH LITHOLAPAXY/RIGHT RETROGRADE PLYOGRAM/RIGHT URETEROSCOPY;  Surgeon: [name], MD;  Location: [facility];  Service: Urology;  Laterality: Right;  ? CYSTOSCOPY WITH RETROGRADE PYELOGRAM, URETEROSCOPY AND STENT PLACEMENT Right 03/14/2014  ? Procedure: CYSTO/RIGHT RETROGRADE PYELOGRAM/RIGHT URETEROSCOPY/STONE EXTRACTION/STENT PLACEMENT;  Surgeon: Malka So, MD;  Location:  [place];  Service: Urology;  Laterality: Right;  ? CYSTOSCOPY WITH RETROGRADE PYELOGRAM, URETEROSCOPY AND STENT PLACEMENT Left 04/19/2019  ? Procedure: CYSTOSCOPY WITH LEFT  RETROGRADE LEFT , URETEROSCOPY WITH HOLMIUM LASER, BASKET EXTRACTION AND STENT PLACEMENT;  Surgeon: [name], MD;  Location: [facility];  Service: Urology;  Laterality: Left;  ? CYSTOSCOPY WITH RETROGRADE PYELOGRAM,  URETEROSCOPY AND STENT PLACEMENT Left 03/30/2020  ? Procedure: CYSTOSCOPY WITH RETROGRADE PYELOGRAM, URETEROSCOPY, BASKETTING OF STONE AND STENT PLACEMENT;  Surgeon: Alexis Frock, MD;  Location: [facility];  Service: Urology;  Laterality: Left;  ? EYE SURGERY Bilateral   ? Cataracts removed  ? HOLMIUM LASER APPLICATION Right 02/21/3660  ? Procedure: RIGHT HOLMIUM LASER APPLICATION;  Surgeon: Malka So, MD;  Location: [facility];  Service: Urology;  Laterality: Right;  ? KNEE ARTHROSCOPY W/ DEBRIDEMENT Right 11-23-2002  ? and Synovectomy  ? LUMBAR LAMINECTOMY/DECOMPRESSION MICRODISCECTOMY N/A 07/15/2018  ? Procedure: Posterior lumbar decompression and fusion L4-5;  Surgeon: Melina Schools, MD;  Location: [place];  Service: Orthopedics;  Laterality: N/A;  4 hrs  ? NEPHROLITHOTOMY  01/16/2011  ? Procedure: NEPHROLITHOTOMY PERCUTANEOUS;  Surgeon: Malka So;  Location: [facility];  Service: Urology;  Laterality: Left;  C-Arm  Holmium Laser  ? NEPHROLITHOTOMY  1981  ? PACEMAKER IMPLANT N/A 04/26/2021  ? Procedure: PACEMAKER IMPLANT;  Surgeon: Constance Haw, MD;  Location: [place] [facility];  Service: Cardiovascular;  Laterality: N/A;  ? SUPRAVENTRICULAR TACHYCARDIA ABLATION N/A 04/28/2014  ? Procedure: SUPRAVENTRICULAR TACHYCARDIA ABLATION;  Surgeon: Evans Lance, MD;  Location: [facility] C[facility];  Service: Cardiovascular;  Laterality: N/A;  ? ?Family History  ?Problem Relation Age of Onset  ? Cancer Mother   ?     Ovarian or colon  ? Heart attack Father   ? Heart attack Brother   ?  ?Social History  ? ?Tobacco Use  ? Smoking status: Former  ?  Packs/day: 1.00  ?  Years: 40.00  ?  Pack years: 40.00  ?  Types: Cigarettes  ?  Quit date: 02/06/1998  ?  Years since quitting: 23.2  ? Smokeless tobacco: Never  ?Substance Use Topics  ? Alcohol use: No  ? ?Marital Status: Widowed  ?ROS  ?Review of Systems  ?HENT:  Positive for hearing loss.   ?Cardiovascular:  Negative for chest pain, dyspnea on  exertion, leg swelling and syncope (no recurrence).  ?Gastrointestinal:  Negative for melena.  ?Objective  ?Blood pressure (!) 137/55, pulse 72, temperature 98 ?F (36.7 ?C), temperature source Temporal, resp. rate 17, height '5\' 10"'$  (1.778 m), weight 174 lb (78.9 kg), SpO2 100 %. Body mass index is 24.97 kg/m?.  ? ? ?  05/03/2021  ? 11:26 AM 04/27/2021  ? 12:12 PM 04/27/2021  ?  9:25 AM  ?Vitals with BMI  ?Height '5\' 10"'$     ?Weight 174 lbs    ?BMI 24.97    ?Systolic 947 654 650  ?Diastolic 55 50 59  ?Pulse 72 90 90  ?  ? Physical Exam ?Vitals reviewed.  ?Neck:  ?   Vascular: No JVD.  ?Cardiovascular:  ?   Rate and Rhythm: Normal rate and regular rhythm.  ?   Pulses: Normal pulses and intact distal pulses.     ?     Carotid pulses are  on the right side with bruit and  on the left side with bruit. ?   Heart sounds: S1 normal and S2 normal. Murmur  heard.  ?Crescendo systolic murmur is present with a grade of 2/6 at the upper right sternal border.  ?Early diastolic murmur is present with a grade of 2/4 at the upper right sternal border.  ?  No gallop.  ?   Comments: Pacemaker site well-healed ?Pulmonary:  ?   Effort: Pulmonary effort is normal.  ?   Breath sounds: Normal breath sounds.  ?Abdominal:  ?   General: Bowel sounds are normal.  ?   Palpations: Abdomen is soft.  ?Musculoskeletal:  ?   Right lower leg: No edema.  ?   Left lower leg: No edema.  ? ?Laboratory examination:  ? ?Recent Labs  ?  04/07/21 ?0809 04/25/21 ?1343 04/25/21 ?1440  ?NA 140 143 143  ?K 3.4* 4.0 3.9  ?CL 109 110 111  ?CO2 '24 27 24  '$ ?GLUCOSE 135* 112* 108*  ?BUN 24* 13 14  ?CREATININE 1.45* 1.39* 1.39*  ?CALCIUM 8.4* 8.2* 8.4*  ?GFRNONAA 47* 49* 49*  ? ?estimated creatinine clearance is 39.4 mL/min (A) (by C-G formula based on SCr of 1.39 mg/dL (H)).  ? ?  Latest Ref Rng & Units 04/25/2021  ?  2:40 PM 04/25/2021  ?  1:43 PM 04/07/2021  ?  8:09 AM  ?CMP  ?Glucose 70 - 99 mg/dL 108   112   135    ?BUN 8 - 23 mg/dL '14   13   24    '$ ?Creatinine 0.61 - 1.24  mg/dL 1.39   1.39   1.45    ?Sodium 135 - 145 mmol/L 143   143   140    ?Potassium 3.5 - 5.1 mmol/L 3.9   4.0   3.4    ?Chloride 98 - 111 mmol/L 111   110   109    ?CO2 22 - 32 mmol/L '24   27   24    '$ ?Calcium 8.9 - 10.

## 2021-05-03 ENCOUNTER — Ambulatory Visit: Payer: Medicare Other | Admitting: Student

## 2021-05-03 ENCOUNTER — Encounter: Payer: Self-pay | Admitting: Student

## 2021-05-03 VITALS — BP 137/55 | HR 72 | Temp 98.0°F | Resp 17 | Ht 70.0 in | Wt 174.0 lb

## 2021-05-03 DIAGNOSIS — I442 Atrioventricular block, complete: Secondary | ICD-10-CM | POA: Diagnosis not present

## 2021-05-03 DIAGNOSIS — Z950 Presence of cardiac pacemaker: Secondary | ICD-10-CM | POA: Diagnosis not present

## 2021-05-03 DIAGNOSIS — I10 Essential (primary) hypertension: Secondary | ICD-10-CM

## 2021-05-03 DIAGNOSIS — I493 Ventricular premature depolarization: Secondary | ICD-10-CM | POA: Diagnosis not present

## 2021-05-08 DIAGNOSIS — D649 Anemia, unspecified: Secondary | ICD-10-CM | POA: Diagnosis not present

## 2021-05-09 ENCOUNTER — Ambulatory Visit (INDEPENDENT_AMBULATORY_CARE_PROVIDER_SITE_OTHER): Payer: Medicare Other

## 2021-05-09 DIAGNOSIS — I442 Atrioventricular block, complete: Secondary | ICD-10-CM | POA: Diagnosis not present

## 2021-05-09 DIAGNOSIS — I10 Essential (primary) hypertension: Secondary | ICD-10-CM | POA: Diagnosis not present

## 2021-05-09 DIAGNOSIS — R413 Other amnesia: Secondary | ICD-10-CM | POA: Diagnosis not present

## 2021-05-09 DIAGNOSIS — E7801 Familial hypercholesterolemia: Secondary | ICD-10-CM | POA: Diagnosis not present

## 2021-05-09 LAB — CUP PACEART INCLINIC DEVICE CHECK
Battery Remaining Longevity: 64 mo
Battery Voltage: 3.08 V
Brady Statistic RA Percent Paced: 30 %
Brady Statistic RV Percent Paced: 95 %
Date Time Interrogation Session: 20230427152203
Implantable Lead Implant Date: 20230414
Implantable Lead Implant Date: 20230414
Implantable Lead Location: 753859
Implantable Lead Location: 753860
Implantable Pulse Generator Implant Date: 20230414
Lead Channel Impedance Value: 400 Ohm
Lead Channel Impedance Value: 475 Ohm
Lead Channel Pacing Threshold Amplitude: 0.5 V
Lead Channel Pacing Threshold Amplitude: 0.5 V
Lead Channel Pacing Threshold Amplitude: 0.75 V
Lead Channel Pacing Threshold Amplitude: 0.75 V
Lead Channel Pacing Threshold Pulse Width: 0.5 ms
Lead Channel Pacing Threshold Pulse Width: 0.5 ms
Lead Channel Pacing Threshold Pulse Width: 0.5 ms
Lead Channel Pacing Threshold Pulse Width: 0.5 ms
Lead Channel Sensing Intrinsic Amplitude: 12 mV
Lead Channel Sensing Intrinsic Amplitude: 5 mV
Lead Channel Setting Pacing Amplitude: 3.5 V
Lead Channel Setting Pacing Amplitude: 3.5 V
Lead Channel Setting Pacing Pulse Width: 0.5 ms
Lead Channel Setting Sensing Sensitivity: 2 mV
Pulse Gen Model: 2272
Pulse Gen Serial Number: 8073672

## 2021-05-09 NOTE — Patient Instructions (Addendum)
? ?  After Your Pacemaker ? ? ?Monitor your pacemaker site for redness, swelling, and drainage. Call the device clinic at [phone] if you experience these symptoms or fever/chills. ? ?Your incision was closed with Steri-strips or staples:  You may shower 7 days after your procedure and wash your incision with soap and water. Avoid lotions, ointments, or perfumes over your incision until it is well-healed. ? ?You may use a hot tub or a pool after your wound check appointment if the incision is completely closed. ? ?Do not lift, push or pull greater than 10 pounds with the affected arm until Jun 07 2021. There are no other restrictions in arm movement after your wound check appointment. ? ?You may drive, unless driving has been restricted by your healthcare providers. ? ? ?Remote monitoring is used to monitor your pacemaker from home. This monitoring is scheduled every 91 days by our office. It allows [place] to keep an eye on the functioning of your device to ensure it is working properly. You will routinely see your Electrophysiologist annually (more often if necessary).  ?

## 2021-05-09 NOTE — Progress Notes (Signed)
Wound check appointment. Steri-strips removed prior to OV. Wound without redness or edema. Incision edges approximated, wound well healed. Normal device function. Thresholds, sensing, and impedances consistent with implant measurements. Device programmed at 3.5V programmed on for extra safety margin until 3 month visit. Histogram distribution appropriate for patient and level of activity. 14AMS <1% AT/Af burden longest episode [id number] seconds EGM illustrate AT. No high ventricular rates noted. Patient educated about wound care, arm mobility, lifting restrictions. ROV 08/05/21 w/ WC. ?

## 2021-05-14 DIAGNOSIS — I6523 Occlusion and stenosis of bilateral carotid arteries: Secondary | ICD-10-CM | POA: Diagnosis not present

## 2021-05-14 DIAGNOSIS — R161 Splenomegaly, not elsewhere classified: Secondary | ICD-10-CM | POA: Diagnosis not present

## 2021-05-14 DIAGNOSIS — R413 Other amnesia: Secondary | ICD-10-CM | POA: Diagnosis not present

## 2021-05-14 DIAGNOSIS — R7309 Other abnormal glucose: Secondary | ICD-10-CM | POA: Diagnosis not present

## 2021-05-14 DIAGNOSIS — N400 Enlarged prostate without lower urinary tract symptoms: Secondary | ICD-10-CM | POA: Diagnosis not present

## 2021-05-14 DIAGNOSIS — H6123 Impacted cerumen, bilateral: Secondary | ICD-10-CM | POA: Diagnosis not present

## 2021-05-14 DIAGNOSIS — I10 Essential (primary) hypertension: Secondary | ICD-10-CM | POA: Diagnosis not present

## 2021-05-14 DIAGNOSIS — Z0000 Encounter for general adult medical examination without abnormal findings: Secondary | ICD-10-CM | POA: Diagnosis not present

## 2021-05-14 DIAGNOSIS — N1831 Chronic kidney disease, stage 3a: Secondary | ICD-10-CM | POA: Diagnosis not present

## 2021-05-20 DIAGNOSIS — Z20822 Contact with and (suspected) exposure to covid-19: Secondary | ICD-10-CM | POA: Diagnosis not present

## 2021-05-27 ENCOUNTER — Telehealth: Payer: Self-pay | Admitting: Hematology

## 2021-05-27 NOTE — Telephone Encounter (Signed)
Scheduled appt per 5/15 referral. Pt's daughter is aware of appt date and time. Pt's daughter is aware to arrive 15 mins prior to appt time and to bring and updated insurance card. Pt's daughter is aware of appt location.   ?

## 2021-06-11 DIAGNOSIS — H6123 Impacted cerumen, bilateral: Secondary | ICD-10-CM | POA: Diagnosis not present

## 2021-06-12 DIAGNOSIS — R2689 Other abnormalities of gait and mobility: Secondary | ICD-10-CM | POA: Diagnosis not present

## 2021-06-12 DIAGNOSIS — E039 Hypothyroidism, unspecified: Secondary | ICD-10-CM | POA: Diagnosis not present

## 2021-06-12 DIAGNOSIS — R413 Other amnesia: Secondary | ICD-10-CM | POA: Diagnosis not present

## 2021-06-12 DIAGNOSIS — M25511 Pain in right shoulder: Secondary | ICD-10-CM | POA: Diagnosis not present

## 2021-06-17 ENCOUNTER — Ambulatory Visit: Payer: Medicare Other

## 2021-06-17 ENCOUNTER — Other Ambulatory Visit: Payer: Self-pay

## 2021-06-17 ENCOUNTER — Inpatient Hospital Stay: Payer: Medicare Other

## 2021-06-17 ENCOUNTER — Inpatient Hospital Stay: Payer: Medicare Other | Attending: Hematology | Admitting: Hematology

## 2021-06-17 VITALS — BP 146/52 | HR 64 | Temp 98.0°F | Resp 18 | Ht 70.0 in | Wt 174.4 lb

## 2021-06-17 DIAGNOSIS — D696 Thrombocytopenia, unspecified: Secondary | ICD-10-CM | POA: Insufficient documentation

## 2021-06-17 DIAGNOSIS — Z9181 History of falling: Secondary | ICD-10-CM | POA: Diagnosis not present

## 2021-06-17 DIAGNOSIS — D709 Neutropenia, unspecified: Secondary | ICD-10-CM | POA: Diagnosis not present

## 2021-06-17 DIAGNOSIS — D61818 Other pancytopenia: Secondary | ICD-10-CM | POA: Insufficient documentation

## 2021-06-17 DIAGNOSIS — N189 Chronic kidney disease, unspecified: Secondary | ICD-10-CM | POA: Insufficient documentation

## 2021-06-17 DIAGNOSIS — Z950 Presence of cardiac pacemaker: Secondary | ICD-10-CM | POA: Diagnosis not present

## 2021-06-17 DIAGNOSIS — Z87891 Personal history of nicotine dependence: Secondary | ICD-10-CM | POA: Insufficient documentation

## 2021-06-17 DIAGNOSIS — D649 Anemia, unspecified: Secondary | ICD-10-CM | POA: Diagnosis not present

## 2021-06-17 DIAGNOSIS — R161 Splenomegaly, not elsewhere classified: Secondary | ICD-10-CM | POA: Diagnosis not present

## 2021-06-17 LAB — CMP (CANCER CENTER ONLY)
ALT: 9 U/L (ref 0–44)
AST: 11 U/L — ABNORMAL LOW (ref 15–41)
Albumin: 4.1 g/dL (ref 3.5–5.0)
Alkaline Phosphatase: 108 U/L (ref 38–126)
Anion gap: 6 (ref 5–15)
BUN: 23 mg/dL (ref 8–23)
CO2: 29 mmol/L (ref 22–32)
Calcium: 9.2 mg/dL (ref 8.9–10.3)
Chloride: 108 mmol/L (ref 98–111)
Creatinine: 1.22 mg/dL (ref 0.61–1.24)
GFR, Estimated: 58 mL/min — ABNORMAL LOW (ref >60)
Glucose, Bld: 117 mg/dL — ABNORMAL HIGH (ref 70–99)
Potassium: 4 mmol/L (ref 3.5–5.1)
Sodium: 143 mmol/L (ref 135–145)
Total Bilirubin: 1.2 mg/dL (ref 0.3–1.2)
Total Protein: 5.8 g/dL — ABNORMAL LOW (ref 6.5–8.1)

## 2021-06-17 LAB — CBC WITH DIFFERENTIAL (CANCER CENTER ONLY)
Abs Immature Granulocytes: 0.01 10*3/uL (ref 0.00–0.07)
Basophils Absolute: 0 10*3/uL (ref 0.0–0.1)
Basophils Relative: 1 %
Eosinophils Absolute: 0 10*3/uL (ref 0.0–0.5)
Eosinophils Relative: 1 %
HCT: 30.8 % — ABNORMAL LOW (ref 39.0–52.0)
Hemoglobin: 9.8 g/dL — ABNORMAL LOW (ref 13.0–17.0)
Immature Granulocytes: 0 %
Lymphocytes Relative: 19 %
Lymphs Abs: 0.8 10*3/uL (ref 0.7–4.0)
MCH: 25.3 pg — ABNORMAL LOW (ref 26.0–34.0)
MCHC: 31.8 g/dL (ref 30.0–36.0)
MCV: 79.6 fL — ABNORMAL LOW (ref 80.0–100.0)
Monocytes Absolute: 0.3 10*3/uL (ref 0.1–1.0)
Monocytes Relative: 6 %
Neutro Abs: 3.1 10*3/uL (ref 1.7–7.7)
Neutrophils Relative %: 73 %
Platelet Count: 132 10*3/uL — ABNORMAL LOW (ref 150–400)
RBC: 3.87 MIL/uL — ABNORMAL LOW (ref 4.22–5.81)
RDW: 16.3 % — ABNORMAL HIGH (ref 11.5–15.5)
WBC Count: 4.3 10*3/uL (ref 4.0–10.5)
nRBC: 0 % (ref 0.0–0.2)

## 2021-06-17 LAB — IRON AND IRON BINDING CAPACITY (CC-WL,HP ONLY)
Iron: 107 ug/dL (ref 45–182)
Saturation Ratios: 40 % — ABNORMAL HIGH (ref 17.9–39.5)
TIBC: 265 ug/dL (ref 250–450)
UIBC: 158 ug/dL (ref 117–376)

## 2021-06-17 LAB — VITAMIN B12: Vitamin B-12: 559 pg/mL (ref 180–914)

## 2021-06-17 LAB — FERRITIN: Ferritin: 67 ng/mL (ref 24–336)

## 2021-06-17 LAB — LACTATE DEHYDROGENASE: LDH: 159 U/L (ref 98–192)

## 2021-06-17 NOTE — Progress Notes (Signed)
[name]    HEMATOLOGY/ONCOLOGY CLINIC NOTE  Date of Service: 06/17/2021  Patient Care Team: Deland Pretty, MD as PCP - General (Internal Medicine)  CHIEF COMPLAINTS/PURPOSE OF CONSULTATION:  Evaluation and management of anemia.  HISTORY OF PRESENTING ILLNESS:   Please see previous note for details on initial presentation  INTERVAL HISTORY Brandon Fischer is a 88 y.o. male who returns today for management and evaluation of his anemia.The patient's last visit with Brandon Fischer was on 10/29/2018. The pt reports that he is doing well overall.  Recent fall back in March.  He got a pacemaker back in March 2023  He notes his wife passed away about 2 years ago and he has been adjusting to that.  He reports that he is eating well.  We discussed progressive anemia and potential causes such as blood loss.  No new or unexpected weight loss. No abnormal bleeding. No abdominal pain or distension. No new infection issues. No other new or focal symptoms.  Labs done 04/25/2021 were reviewed in detail. CBC shows Hgb of 9.1, WBC count of 5.4, platelets of 248. Counts otherwise stable. CMP shows elevated creatinine of 1.39 and GFR est of 49 which is consistent with chronic kidney disease.   MEDICAL HISTORY:  Past Medical History:  Diagnosis Date   Bilateral carotid artery stenosis without cerebral infarction    ASYMPTOMATIC--  BILATERAL ICA  50-69%  PER CARDIOLOGIST NOTE, DR Einar Gip   Bilateral carotid bruits    LEFT  soft bruit, right no no carotid bruit per 07-24-2014 dr Einar Gip note   CKD (chronic kidney disease)    dr foster Narda Amber kidney, ckd stage 3 per lov 02-16-2019   Complete heart block ([place]) 1/44/3154   Complication of anesthesia    1 episode atrial fib 02-09-2014 [facility] and followed up with cardiology, not further issue   Coronary artery disease    NON-OBSTRUCTIVE CAD  per cath 08-06-2004 HX PALPITATIONS-AND -TACHYCARDIA-   Encounter for care of pacemaker 04/26/2021   First degree heart  block    Full dentures    GERD (gastroesophageal reflux disease)    Heart palpitations    Not any more   History of atrial fibrillation without current medication    EPISODE OF AFIB WITH RVR INTRAOPERATIVELY 02-09-2014 AT [facility]--  RESOLVED AND PT FOLLOWED UP WITH CARDIOLOGIST  DR Einar Gip   History of kidney stones    Hyperlipidemia    Hypertension    Nephrolithiasis    RIGHT   Organic impotence    Pacemaker: [employer] ASSURITY DR-RF - [id number] 04/26/2021   Psoriasis    Clearing up   PSVT (paroxysmal supraventricular tachycardia) ([place])    Right ureteral calculus    S/P radiofrequency ablation operation for arrhythmia-SVT 04/28/2014   Simple renal cyst    right   Wears glasses    Wears hearing aid    BILATERAL  History of malignant melanoma status post Mohs surgery to remove the skin lesion on the face with skin grafting. 3 years ago.  SURGICAL HISTORY: Past Surgical History:  Procedure Laterality Date   CARDIAC CATHETERIZATION  08-06-2004 dr Verlon Setting   (abnormal stress test) Non-obstructive CAD, pLAD 20-30%,  LCX 30-40%, pRCA 20-30%, dRCA 40%, distal LM  mild diffuse calcifation 20-30% tapering stenosis,  preserved LVSF ef 55-60%   CARPAL TUNNEL RELEASE Bilateral 2005   CATARACT EXTRACTION, BILATERAL Bilateral 2011   CHOLECYSTECTOMY OPEN  1982   and NEPHROLITHOTOMY   COLONOSCOPY W/ POLYPECTOMY  last one 04-05-2008  CYSTOSCOPY WITH LITHOLAPAXY Right 04/14/2017   Procedure: CYSTOSCOPY WITH LITHOLAPAXY/RIGHT RETROGRADE PLYOGRAM/RIGHT URETEROSCOPY;  Surgeon: [name], MD;  Location: [facility];  Service: Urology;  Laterality: Right;   CYSTOSCOPY WITH RETROGRADE PYELOGRAM, URETEROSCOPY AND STENT PLACEMENT Right 03/14/2014   Procedure: CYSTO/RIGHT RETROGRADE PYELOGRAM/RIGHT URETEROSCOPY/STONE EXTRACTION/STENT PLACEMENT;  Surgeon: Malka So, MD;  Location: [facility];  Service: Urology;  Laterality: Right;   CYSTOSCOPY WITH RETROGRADE PYELOGRAM,  URETEROSCOPY AND STENT PLACEMENT Left 04/19/2019   Procedure: CYSTOSCOPY WITH LEFT  RETROGRADE LEFT , URETEROSCOPY WITH HOLMIUM LASER, BASKET EXTRACTION AND STENT PLACEMENT;  Surgeon: [name], MD;  Location: [facility];  Service: Urology;  Laterality: Left;   CYSTOSCOPY WITH RETROGRADE PYELOGRAM, URETEROSCOPY AND STENT PLACEMENT Left 03/30/2020   Procedure: CYSTOSCOPY WITH RETROGRADE PYELOGRAM, URETEROSCOPY, BASKETTING OF STONE AND STENT PLACEMENT;  Surgeon: Alexis Frock, MD;  Location: [facility];  Service: Urology;  Laterality: Left;   EYE SURGERY Bilateral    Cataracts removed   HOLMIUM LASER APPLICATION Right 09/16/7652   Procedure: RIGHT HOLMIUM LASER APPLICATION;  Surgeon: Malka So, MD;  Location: [facility];  Service: Urology;  Laterality: Right;   KNEE ARTHROSCOPY W/ DEBRIDEMENT Right 11-23-2002   and Synovectomy   LUMBAR LAMINECTOMY/DECOMPRESSION MICRODISCECTOMY N/A 07/15/2018   Procedure: Posterior lumbar decompression and fusion L4-5;  Surgeon: Melina Schools, MD;  Location: [place];  Service: Orthopedics;  Laterality: N/A;  4 hrs   NEPHROLITHOTOMY  01/16/2011   Procedure: NEPHROLITHOTOMY PERCUTANEOUS;  Surgeon: Malka So;  Location: [facility];  Service: Urology;  Laterality: Left;  C-Arm  Holmium Laser   NEPHROLITHOTOMY  1981   PACEMAKER IMPLANT N/A 04/26/2021   Procedure: PACEMAKER IMPLANT;  Surgeon: Constance Haw, MD;  Location: [place] [facility];  Service: Cardiovascular;  Laterality: N/A;   SUPRAVENTRICULAR TACHYCARDIA ABLATION N/A 04/28/2014   Procedure: SUPRAVENTRICULAR TACHYCARDIA ABLATION;  Surgeon: Evans Lance, MD;  Location: [facility];  Service: Cardiovascular;  Laterality: N/A;    SOCIAL HISTORY: Social History   Socioeconomic History   Marital status: Widowed    Spouse name: Not on file   Number of children: 2   Years of education: Not on file   Highest education level: Not on file  Occupational History    Occupation: postal service     Comment: retired  Tobacco Use   Smoking status: Former    Packs/day: 1.00    Years: 40.00    Pack years: 40.00    Types: Cigarettes    Quit date: 02/06/1998    Years since quitting: 23.3   Smokeless tobacco: Never  Vaping Use   Vaping Use: Never used  Substance and Sexual Activity   Alcohol use: No   Drug use: No   Sexual activity: Not on file  Other Topics Concern   Not on file  Social History Narrative   Not on file   Social Determinants of Health   Financial Resource Strain: Not on file  Food Insecurity: Not on file  Transportation Needs: Not on file  Physical Activity: Not on file  Stress: Not on file  Social Connections: Not on file  Intimate Partner Violence: Not on file    FAMILY HISTORY: Family History  Problem Relation Age of Onset   Cancer Mother        Ovarian or colon   Heart attack Father    Heart attack Brother     ALLERGIES:  is allergic to bupropion, olmesartan, and trazodone.  MEDICATIONS:  Current Outpatient Medications  Medication Sig Dispense Refill   Ascorbic Acid (VITAMIN C PO) Take 1 tablet by mouth daily.     aspirin (ASPIRIN CHILDRENS) 81 MG chewable tablet Chew 1 tablet (81 mg total) by mouth daily.     Cholecalciferol (VITAMIN D-3) 25 MCG (1000 UT) CAPS Take 1,000 Units by mouth daily with breakfast.     hydrALAZINE (APRESOLINE) 10 MG tablet Take 1 tablet (10 mg total) by mouth 3 (three) times daily. 90 tablet 0   losartan (COZAAR) 50 MG tablet Take 50 mg by mouth daily.     metoprolol succinate (TOPROL XL) 25 MG 24 hr tablet Take 1 tablet (25 mg total) by mouth daily. 30 tablet 2   pantoprazole (PROTONIX) 40 MG tablet Take 40 mg by mouth in the morning and at bedtime.      tamsulosin (FLOMAX) 0.4 MG CAPS capsule Take 0.4 mg by mouth daily after supper.     traMADol (ULTRAM) 50 MG tablet Take 1 tablet (50 mg total) by mouth every 6 (six) hours as needed for moderate pain or severe pain. Post-operatively  15 tablet 0   zinc gluconate 50 MG tablet Take 50 mg by mouth daily.     No current facility-administered medications for this visit.    REVIEW OF SYSTEMS:   .10 Point review of Systems was done is negative except as noted above.  PHYSICAL EXAMINATION:  ECOG FS:1 - Symptomatic but completely ambulatory  Vitals:   06/17/21 1303  BP: (!) 146/52  Pulse: 64  Resp: 18  Temp: 98 F (36.7 C)  SpO2: 99%   Wt Readings from Last 3 Encounters:  06/17/21 174 lb 6.4 oz (79.1 kg)  05/03/21 174 lb (78.9 kg)  04/27/21 171 lb 11.8 oz (77.9 kg)   Body mass index is 25.02 kg/m.   NAD GENERAL:alert, in no acute distress and comfortable SKIN: no acute rashes, no significant lesions EYES: conjunctiva are pink and non-injected, sclera anicteric NECK: supple, no JVD LYMPH:  no palpable lymphadenopathy in the cervical, axillary or inguinal regions LUNGS: clear to auscultation b/l with normal respiratory effort HEART: regular rate & rhythm ABDOMEN:  normoactive bowel sounds , non tender, not distended. Just palpable splenomegaly. Extremity: no pedal edema PSYCH: alert & oriented x 3 with fluent speech NEURO: no focal motor/sensory deficits  Exam performed in chair.  LABORATORY DATA:  I have reviewed the data as listed  .    Latest Ref Rng & Units 04/25/2021    2:40 PM 04/25/2021    1:43 PM 04/07/2021    8:09 AM  CBC  WBC 4.0 - 10.5 K/uL 5.4   5.6   11.3    Hemoglobin 13.0 - 17.0 g/dL 9.1   9.0   9.6    Hematocrit 39.0 - 52.0 % 30.3   29.8   30.0    Platelets 150 - 400 K/uL 248   264   170      CBC    Component Value Date/Time   WBC 5.4 04/25/2021 1440   RBC 3.57 (L) 04/25/2021 1440   HGB 9.1 (L) 04/25/2021 1440   HGB 12.0 (L) 12/24/2016 1113   HCT 30.3 (L) 04/25/2021 1440   HCT 36.2 (L) 12/24/2016 1113   PLT 248 04/25/2021 1440   PLT 123 (L) 12/24/2016 1113   MCV 84.9 04/25/2021 1440   MCV 79.7 12/24/2016 1113   MCH 25.5 (L) 04/25/2021 1440   MCHC 30.0 04/25/2021 1440    RDW  17.5 (H) 04/25/2021 1440   RDW 16.2 (H) 12/24/2016 1113   LYMPHSABS 0.4 (L) 04/07/2021 0809   LYMPHSABS 0.7 (L) 12/24/2016 1113   MONOABS 0.8 04/07/2021 0809   MONOABS 0.4 12/24/2016 1113   EOSABS 0.0 04/07/2021 0809   EOSABS 0.0 12/24/2016 1113   BASOSABS 0.0 04/07/2021 0809   BASOSABS 0.0 12/24/2016 1113    .    Latest Ref Rng & Units 04/25/2021    2:40 PM 04/25/2021    1:43 PM 04/07/2021    8:09 AM  CMP  Glucose 70 - 99 mg/dL 108   112   135    BUN 8 - 23 mg/dL '14   13   24    '$ Creatinine 0.61 - 1.24 mg/dL 1.39   1.39   1.45    Sodium 135 - 145 mmol/L 143   143   140    Potassium 3.5 - 5.1 mmol/L 3.9   4.0   3.4    Chloride 98 - 111 mmol/L 111   110   109    CO2 22 - 32 mmol/L '24   27   24    '$ Calcium 8.9 - 10.3 mg/dL 8.4   8.2   8.4    Total Protein 6.5 - 8.1 g/dL 5.9   5.5     Total Bilirubin 0.3 - 1.2 mg/dL 1.5   1.5     Alkaline Phos 38 - 126 U/L 120   118     AST 15 - 41 U/L 16   13     ALT 0 - 44 U/L 18   16      . Lab Results  Component Value Date   LDH 171 06/24/2017     Component     Latest Ref Rng & Units 09/09/2016  IgG (Immunoglobin G), Serum     700 - 1,600 mg/dL 917  IgA/Immunoglobulin A, Serum     61 - 437 mg/dL 203  IgM, Qn, Serum     15 - 143 mg/dL 43  Total Protein     6.0 - 8.5 g/dL 6.1  Albumin SerPl Elph-Mcnc     2.9 - 4.4 g/dL 3.7  Alpha 1     0.0 - 0.4 g/dL 0.3  Alpha2 Glob SerPl Elph-Mcnc     0.4 - 1.0 g/dL 0.5  B-Globulin SerPl Elph-Mcnc     0.7 - 1.3 g/dL 0.8  Gamma Glob SerPl Elph-Mcnc     0.4 - 1.8 g/dL 0.7  M Protein SerPl Elph-Mcnc     Not Observed g/dL Not Observed  Globulin, Total     2.2 - 3.9 g/dL 2.4  Albumin/Glob SerPl     0.7 - 1.7 1.6  IFE 1      Comment  Please Note (HCV):      Comment  Iron     42 - 163 ug/dL 64  TIBC     202 - 409 ug/dL 194 (L)  UIBC     117 - 376 ug/dL 130  %SAT     20 - 55 % 33  Folate, Hemolysate     Not Estab. ng/mL 369.8  HCT     37.5 - 51.0 % 28.0 (L)  Folate, RBC      >498 ng/mL 1,321  Ig Kappa Free Light Chain     3.3 - 19.4 mg/L 21.4 (H)  Ig Lambda Free Light Chain     5.7 - 26.3 mg/L 36.6 (H)  Kappa/Lambda FluidC Ratio  0.26 - 1.65 0.58  LDH     125 - 245 U/L 326 (H)  Sed Rate     0 - 30 mm/hr 2  Vitamin B12     232 - 1245 pg/mL 1,261 (H)  TSH     0.320 - 4.118 m(IU)/L 2.609  Hep C Virus Ab     0.0 - 0.9 s/co ratio 0.1  Ferritin     22 - 316 ng/ml 399 (H)   Component     Latest Ref Rng & Units 09/17/2016 09/18/2016  Prothrombin Time     11.4 - 15.2 seconds  14.2  INR       1.11  LDH     125 - 245 U/L 309 (H)   Haptoglobin     34 - 200 mg/dL <10 (L)   Coombs', Direct     Negative Negative   APTT     24 - 36 seconds  32   Component     Latest Ref Rng & Units 09/30/2016  RA Latex Turbid.     0.0 - 13.9 IU/mL <10.0  CCP Antibodies IgG/IgA     0 - 19 units 12  ANA Ab, IFA      Negative  Sed Rate     0 - 30 mm/hr 2   Component     Latest Ref Rng & Units 09/30/2016 09/30/2016 09/30/2016 10/22/2016         3:23 PM  3:23 PM  3:23 PM   Interpretation       Comment    Comment:       Comment Comment   Specimen:       Peripheral Blood    Submitted Dx:       Comment    Viability:       97%    Cell Population       Comment    Granulocytes:       Comment    Monocytes:       Comment    Antibodies Performed:       Comment    Director Review       Comment    RA Latex Turbid.     0.0 - 13.9 IU/mL <10.0     CCP Antibodies IgG/IgA     0 - 19 units 12     ANA Ab, IFA      Negative     LDH     125 - 245 U/L    185           RADIOGRAPHIC STUDIES: I have personally reviewed the radiological images as listed and agreed with the findings in the report. No results found.  ASSESSMENT & PLAN:   #1 h/o Pancytopenia with normocytic Anemia, neutropenia and thrombocytopenia  This could certainly be from the patient's splenomegaly causing hypersplenism. Etiology of the splenomegaly is unclear.  09/18/16 Bone marrow showed  no overt evidence of MDS/Myelofibrosis no evidence of acute leukemia Increased CD8+ T cell population ? Reactive vs clonal. Normal cytogenetics. Sed rate WNL neg ANA and Rheumatoid panel  No evidence of monoclonal paraproteinemia. TSH levels within normal limits B12, folate and iron levels within normal limits. Hepatitis C negative  Given improvement in counts - the T cell population likely reactive and could be related to a viral infection that appears to be resolving now.  05/07/17 Brandon Fischer Abdomen revealed decreased spleen size to about 900 cubic cm, and explains mild thrombocytopenia with PLT at 128k  10/02/17 Brandon Fischer Abdomen revealed Status post cholecystectomy. Bilateral simple renal cysts are noted. Mild splenomegaly is noted with calculated volume of 630 cubic cm, which is decreased compared to prior exam.  #2  Currently Anemia - borderline microcytic with normal PLTs and WBC count  PLAN: -Labs done 04/25/2021 were reviewed in detail. CBC shows Hgb of 9.1, WBC count of 5.4, platelets of 248. Counts otherwise stable. No thrombocytopenia or leucopenia only borderline microcytic anemia. CMP shows elevated creatinine of 1.39 and GFR est of 49 which is consistent with chronic kidney disease.  -Advised to RTC if there is any more concern from PCP -Recommended to continue taking B complex supplement. -RTC as needed based on labs -Labs today   FOLLOW UP: Labs today RTC with Dr Irene Limbo as needed based on labs    Orders Placed This Encounter  Procedures   CBC with Differential ([place] Only)    Standing Status:   Future    Number of Occurrences:   1    Standing Expiration Date:   06/18/2022   CMP ([place] only)    Standing Status:   Future    Number of Occurrences:   1    Standing Expiration Date:   06/18/2022   Ferritin    Standing Status:   Future    Number of Occurrences:   1    Standing Expiration Date:   06/18/2022   Lactate dehydrogenase    Standing Status:   Future     Number of Occurrences:   1    Standing Expiration Date:   06/18/2022   Haptoglobin    Standing Status:   Future    Number of Occurrences:   1    Standing Expiration Date:   06/18/2022   Vitamin B12    Standing Status:   Future    Number of Occurrences:   1    Standing Expiration Date:   06/17/2022   Folate RBC    Standing Status:   Future    Number of Occurrences:   1    Standing Expiration Date:   06/18/2022   Erythropoietin    Standing Status:   Future    Number of Occurrences:   1    Standing Expiration Date:   06/17/2022    The total time spent in the appt was 30 minutes and more than 50% was on counseling and direct patient cares.  All of the patient's questions were answered with apparent satisfaction. The patient knows to call the clinic with any problems, questions or concerns.  Sullivan Lone MD MS AAHIVMS [facility] Hematology/Oncology Physician [place]  (Office):       [phone] (Work cell):  [phone] (Fax):           [phone]  I, Melene Muller, am acting as scribe for Dr. Sullivan Lone, MD.  .I have reviewed the above documentation for accuracy and completeness, and I agree with the above. Brunetta Genera MD   ADDENDUM   Labs reviewed  important suggestion  View Detailed Reports    important suggestion  View Condensed Results Report    Contains abnormal data CBC with Differential ([place] Only) Order: [id number] Collected 06/17/2021 13:30    0 Result Notes     Component Ref Range & Units 5 d ago  WBC Count 4.0 - 10.5 K/uL 4.3   RBC 4.22 - 5.81 MIL/uL 3.87 Low    Hemoglobin 13.0 - 17.0 g/dL 9.8 Low  HCT 39.0 - 52.0 % 30.8 Low    MCV 80.0 - 100.0 fL 79.6 Low    MCH 26.0 - 34.0 pg 25.3 Low    MCHC 30.0 - 36.0 g/dL 31.8   RDW 11.5 - 15.5 % 16.3 High    Platelet Count 150 - 400 K/uL 132 Low    nRBC 0.0 - 0.2 % 0.0   Neutrophils Relative % % 73   Neutro Abs 1.7 - 7.7 K/uL 3.1   Lymphocytes Relative % 19   Lymphs Abs 0.7 - 4.0  K/uL 0.8   Monocytes Relative % 6   Monocytes Absolute 0.1 - 1.0 K/uL 0.3   Eosinophils Relative % 1   Eosinophils Absolute 0.0 - 0.5 K/uL 0.0   Basophils Relative % 1   Basophils Absolute 0.0 - 0.1 K/uL 0.0   Immature Granulocytes % 0   Abs Immature Granulocytes 0.00 - 0.07 K/uL 0.01   Comment: Performed at [facility], [place] [address], [place], [place] [address]     View Full Report     Specimen Collected: 06/17/21 13:30 Last Resulted: 06/17/21 13:51         Result Care Coordination   Patient Communication   Add Comments   Not seen Back to Top       Lactate dehydrogenase Order: [id number] Collected 06/17/2021 13:31    0 Result Notes     Component Ref Range & Units 5 d ago  LDH 98 - 192 U/L 159   Comment: Performed at [facility], [place] [address] [place], [place] [address]     View Full Report     Specimen Collected: 06/17/21 13:31 Last Resulted: 06/17/21 14:59         Result Care Coordination   Patient Communication   Add Comments   Not seen Back to Top       Ferritin Order: [id number] Collected 06/17/2021 13:31    0 Result Notes     Component Ref Range & Units 5 d ago  Ferritin 24 - 336 ng/mL 67   Comment: Performed at [facility], [address] [place], [place] [address]     View Full Report     Specimen Collected: 06/17/21 13:31 Last Resulted: 06/17/21 18:11         Result Care Coordination   Patient Communication   Add Comments   Not seen Back to Top        Contains abnormal data Iron and Iron Binding Capacity ([facility] only) Order: [id number] Collected 06/17/2021 13:46    0 Result Notes     Component Ref Range & Units 5 d ago  Iron 45 - 182 ug/dL 107   TIBC 250 - 450 ug/dL 265   Saturation Ratios 17.9 - 39.5 % 40 High    UIBC 117 - 376 ug/dL 158   Comment: Performed at [facility], [place] [address] [place],  [address]      View Full Report     Specimen Collected: 06/17/21 13:46 Last Resulted: 06/17/21 15:05         Result Care Coordination   Patient Communication   Add Comments   Not seen Back to Top        Contains abnormal data CMP ([place] only) Order: [id number] Collected 06/17/2021 13:30    0 Result Notes     Component Ref Range & Units 5 d ago  Sodium 135 - 145 mmol/L 143  Potassium 3.5 - 5.1 mmol/L 4.0   Chloride 98 - 111 mmol/L 108   CO2 22 - 32 mmol/L 29   Glucose, Bld 70 - 99 mg/dL 117 High    Comment: Glucose reference range applies only to samples taken after fasting for at least 8 hours.  BUN 8 - 23 mg/dL 23   Creatinine 0.61 - 1.24 mg/dL 1.22   Calcium 8.9 - 10.3 mg/dL 9.2   Total Protein 6.5 - 8.1 g/dL 5.8 Low    Albumin 3.5 - 5.0 g/dL 4.1   AST 15 - 41 U/L 11 Low    ALT 0 - 44 U/L 9   Alkaline Phosphatase 38 - 126 U/L 108   Total Bilirubin 0.3 - 1.2 mg/dL 1.2   GFR, Estimated >60 mL/min 58 Low    Comment: (NOTE)  Calculated using the CKD-EPI Creatinine Equation (2021)   Anion gap 5 - 15 6   Comment: Performed at [facility], [place] [address] [place], [place] [address]     View Full Report     Specimen Collected: 06/17/21 13:30 Last Resulted: 06/17/21 14:13         Result Care Coordination   Patient Communication   Add Comments   Not seen Back to Top         Microcytic Anemia from Functional iron deficiency and CKD. Hgb a little better at 9.8 PLAN -Iron polysaccharide '150mg'$  po daily to try to target a ferritin closer to 200. -No indication for EPO at this time -RTC with Dr Irene Limbo in 4 months with rpt labs. -if ferritin not improving quickly enough or po iron not tolerated will need to consider IV Iron.  Brunetta Genera MD

## 2021-06-18 DIAGNOSIS — R2689 Other abnormalities of gait and mobility: Secondary | ICD-10-CM | POA: Diagnosis not present

## 2021-06-18 DIAGNOSIS — M25511 Pain in right shoulder: Secondary | ICD-10-CM | POA: Diagnosis not present

## 2021-06-18 LAB — HAPTOGLOBIN: Haptoglobin: 58 mg/dL (ref 38–329)

## 2021-06-18 LAB — FOLATE RBC
Folate, Hemolysate: 404 ng/mL
Folate, RBC: 1329 ng/mL (ref >498)
Hematocrit: 30.4 % — ABNORMAL LOW (ref 37.5–51.0)

## 2021-06-19 LAB — ERYTHROPOIETIN: Erythropoietin: 23 m[IU]/mL — ABNORMAL HIGH (ref 2.6–18.5)

## 2021-06-20 ENCOUNTER — Ambulatory Visit: Payer: Medicare Other

## 2021-06-20 DIAGNOSIS — I6523 Occlusion and stenosis of bilateral carotid arteries: Secondary | ICD-10-CM

## 2021-06-20 DIAGNOSIS — R0989 Other specified symptoms and signs involving the circulatory and respiratory systems: Secondary | ICD-10-CM | POA: Diagnosis not present

## 2021-06-21 DIAGNOSIS — M25511 Pain in right shoulder: Secondary | ICD-10-CM | POA: Diagnosis not present

## 2021-06-21 DIAGNOSIS — R2689 Other abnormalities of gait and mobility: Secondary | ICD-10-CM | POA: Diagnosis not present

## 2021-06-25 ENCOUNTER — Encounter: Payer: Self-pay | Admitting: *Deleted

## 2021-06-26 ENCOUNTER — Ambulatory Visit (INDEPENDENT_AMBULATORY_CARE_PROVIDER_SITE_OTHER): Payer: Medicare Other | Admitting: Diagnostic Neuroimaging

## 2021-06-26 ENCOUNTER — Encounter: Payer: Self-pay | Admitting: Diagnostic Neuroimaging

## 2021-06-26 VITALS — BP 139/59 | HR 61 | Ht 70.0 in | Wt 175.4 lb

## 2021-06-26 DIAGNOSIS — I6523 Occlusion and stenosis of bilateral carotid arteries: Secondary | ICD-10-CM | POA: Diagnosis not present

## 2021-06-26 DIAGNOSIS — F03B0 Unspecified dementia, moderate, without behavioral disturbance, psychotic disturbance, mood disturbance, and anxiety: Secondary | ICD-10-CM | POA: Diagnosis not present

## 2021-06-26 NOTE — Patient Instructions (Signed)
  MILD-MODERATE DEMENTIA - safety / supervision issues reviewed - daily physical activity / exercise (at least 15-30 minutes) - eat more plants / vegetables - increase social activities, brain stimulation, games, puzzles, hobbies, crafts, arts, music - aim for at least 7-8 hours sleep per night (or more) - avoid smoking and alcohol - caregiver resources provided - caution with living alone, medications, finances; no driving

## 2021-06-26 NOTE — Progress Notes (Signed)
[facility]  PATIENT: Brandon Fischer DOB: 15-Jan-1933  REFERRING CLINICIAN: Holland Commons, FNP HISTORY FROM: patient and daughter REASON FOR VISIT: new consult    HISTORICAL  CHIEF COMPLAINT:  Chief Complaint  Patient presents with   Memory Loss    Rm 7 New Pt  dgtr-- [name]  MMSE 18   Frequent Falls    "Doing PT'    HISTORY OF PRESENT ILLNESS:   88 year old male here for evaluation of gradual onset progressive cognitive decline, memory loss, language difficulty and balance difficulty.  Patient was living at home with his wife until she passed away in 2019/04/18.  She helps take care of him for most of household functions.  He continues to live alone since she passed away.  However family was noticing that he was having more trouble maintaining certain functions at home.  He was starting to repeat himself, having short-term memory loss, language and word finding difficulties.  He had a bad fall in 2021/04/17 and taken to the emergency room.  He was found to have complete heart block treated with pacemaker.  Since that time patient continues to live on his own.  His daughter and son live nearby and checks in on him on a daily basis.  He has a life alert and is able to use a telephone.  Does not drive anymore.  Family helps manage medications.   REVIEW OF SYSTEMS: Full 14 system review of systems performed and negative with exception of: as per HPI.  ALLERGIES: Allergies  Allergen Reactions   Bupropion     Other reaction(s): disoriented   Olmesartan     Other reaction(s): rash   Trazodone     Other reaction(s): unsteady gait 06/26/21 sometimes takes 1 as needed    HOME MEDICATIONS: Outpatient Medications Prior to Visit  Medication Sig Dispense Refill   Ascorbic Acid (VITAMIN C PO) Take 1 tablet by mouth daily.     aspirin (ASPIRIN CHILDRENS) 81 MG chewable tablet Chew 1 tablet (81 mg total) by mouth daily.     Cholecalciferol (VITAMIN D-3) 25 MCG  (1000 UT) CAPS Take 1,000 Units by mouth daily with breakfast.     Ferrous Sulfate (IRON) 28 MG TABS daily.     hydrALAZINE (APRESOLINE) 10 MG tablet Take 1 tablet (10 mg total) by mouth 3 (three) times daily. 90 tablet 0   losartan (COZAAR) 50 MG tablet Take 50 mg by mouth daily.     metoprolol succinate (TOPROL XL) 25 MG 24 hr tablet Take 1 tablet (25 mg total) by mouth daily. 30 tablet 2   pantoprazole (PROTONIX) 40 MG tablet Take 40 mg by mouth in the morning and at bedtime.      tamsulosin (FLOMAX) 0.4 MG CAPS capsule Take 0.4 mg by mouth daily after supper.     traMADol (ULTRAM) 50 MG tablet Take 1 tablet (50 mg total) by mouth every 6 (six) hours as needed for moderate pain or severe pain. Post-operatively 15 tablet 0   zinc gluconate 50 MG tablet Take 50 mg by mouth daily.     No facility-administered medications prior to visit.    PAST MEDICAL HISTORY: Past Medical History:  Diagnosis Date   Bilateral carotid artery stenosis without cerebral infarction    ASYMPTOMATIC--  BILATERAL ICA  50-69%  PER CARDIOLOGIST NOTE, DR Einar Gip   Bilateral carotid bruits    LEFT  soft bruit, right no no carotid bruit per 07-24-2014 dr Einar Gip note   CKD (  chronic kidney disease)    dr foster [place] kidney, ckd stage 3 per lov 02-16-2019   Complete heart block ([place]) 50/27/7412   Complication of anesthesia    1 episode atrial fib 02-09-2014 [facility] and followed up with cardiology, not further issue   Coronary artery disease    NON-OBSTRUCTIVE CAD  per cath 08-06-2004 HX PALPITATIONS-AND -TACHYCARDIA-   Encounter for care of pacemaker 04/26/2021   First degree heart block    Full dentures    GERD (gastroesophageal reflux disease)    Heart palpitations    Not any more   History of atrial fibrillation without current medication    EPISODE OF AFIB WITH RVR [facility]OPER[facility]IVELY [facility]09[facility]2014 [facility]--  RESOLVED AND PT FOLLOWED UP WITH CARDIOLOGIST  DR Einar Gip   History of kidney stones    Hyperlipidemia     Hypertension    Memory loss    Nephrolithiasis    RIGHT   Organic impotence    Pacemaker: [employer] ASSURITY DR-RF - [id number] 04/26/2021   Psoriasis    Clearing up   PSVT (paroxysmal supraventricular tachycardia) ([place])    Right ureteral calculus    S/P radiofrequency ablation operation for arrhythmia-SVT 04/28/2014   Simple renal cyst    right   Wears glasses    Wears hearing aid    BILATERAL    PAST SURGICAL HISTORY: Past Surgical History:  Procedure Laterality Date   CARDIAC CATHETERIZATION  08-06-2004 dr Verlon Setting   (abnormal stress test) Non-obstructive CAD, pLAD 20-30%,  LCX 30-40%, pRCA 20-30%, dRCA 40%, distal LM  mild diffuse calcifation 20-30% tapering stenosis,  preserved LVSF ef 55-60%   CARPAL TUNNEL RELEASE Bilateral 2005   CATARACT EXTRACTION, BILATERAL Bilateral 2011   CHOLECYSTECTOMY     CHOLECYSTECTOMY OPEN  1982   and NEPHROLITHOTOMY   COLONOSCOPY W/ POLYPECTOMY  last one 04-05-2008   CYSTOSCOPY WITH LITHOLAPAXY Right 04/14/2017   Procedure: CYSTOSCOPY WITH LITHOLAPAXY/RIGHT RETROGRADE PLYOGRAM/RIGHT URETEROSCOPY;  Surgeon: [name], MD;  Location: [facility];  Service: Urology;  Laterality: Right;   CYSTOSCOPY WITH RETROGRADE PYELOGRAM, URETEROSCOPY AND STENT PLACEMENT Right 03/14/2014   Procedure: CYSTO/RIGHT RETROGRADE PYELOGRAM/RIGHT URETEROSCOPY/STONE EXTRACTION/STENT PLACEMENT;  Surgeon: Malka So, MD;  Location: [facility];  Service: Urology;  Laterality: Right;   CYSTOSCOPY WITH RETROGRADE PYELOGRAM, URETEROSCOPY AND STENT PLACEMENT Left 04/19/2019   Procedure: CYSTOSCOPY WITH LEFT  RETROGRADE LEFT , URETEROSCOPY WITH HOLMIUM LASER, BASKET EXTRACTION AND STENT PLACEMENT;  Surgeon: [name], MD;  Location: [facility];  Service: Urology;  Laterality: Left;   CYSTOSCOPY WITH RETROGRADE PYELOGRAM, URETEROSCOPY AND STENT PLACEMENT Left 03/30/2020   Procedure: CYSTOSCOPY WITH RETROGRADE PYELOGRAM,  URETEROSCOPY, BASKETTING OF STONE AND STENT PLACEMENT;  Surgeon: Alexis Frock, MD;  Location: [facility];  Service: Urology;  Laterality: Left;   EYE SURGERY Bilateral    Cataracts removed   HOLMIUM LASER APPLICATION Right 09/47/0962   Procedure: RIGHT HOLMIUM LASER APPLICATION;  Surgeon: Malka So, MD;  Location: [facility];  Service: Urology;  Laterality: Right;   KNEE ARTHROSCOPY W/ DEBRIDEMENT Right 11/23/2002   and Synovectomy   LUMBAR LAMINECTOMY/DECOMPRESSION MICRODISCECTOMY N/A 07/15/2018   Procedure: Posterior lumbar decompression and fusion L4-5;  Surgeon: Melina Schools, MD;  Location: [place];  Service: Orthopedics;  Laterality: N/A;  4 hrs   NEPHROLITHOTOMY  01/16/2011   Procedure: NEPHROLITHOTOMY PERCUTANEOUS;  Surgeon: Malka So;  Location: [facility];  Service: Urology;  Laterality: Left;  C-Arm  Holmium Laser  NEPHROLITHOTOMY  1981   PACEMAKER IMPLANT N/A 04/26/2021   Procedure: PACEMAKER IMPLANT;  Surgeon: Constance Haw, MD;  Location: [facility];  Service: Cardiovascular;  Laterality: N/A;   SUPRAVENTRICULAR TACHYCARDIA ABLATION N/A 04/28/2014   Procedure: SUPRAVENTRICULAR TACHYCARDIA ABLATION;  Surgeon: Evans Lance, MD;  Location: [facility];  Service: Cardiovascular;  Laterality: N/A;    FAMILY HISTORY: Family History  Problem Relation Age of Onset   Cancer Mother        Ovarian or colon   Heart attack Father    Heart attack Brother     SOCIAL HISTORY: Social History   Socioeconomic History   Marital status: Widowed    Spouse name: Not on file   Number of children: 2   Years of education: Not on file   Highest education level: Some college, no degree  Occupational History   Occupation: postal service     Comment: retired  Tobacco Use   Smoking status: Former    Packs/day: 1.00    Years: 40.00    Total pack years: 40.00    Types: Cigarettes    Quit date: 02/06/1998    Years since quitting: 23.4    Smokeless tobacco: Never  Vaping Use   Vaping Use: Never used  Substance and Sexual Activity   Alcohol use: No   Drug use: No   Sexual activity: Not on file  Other Topics Concern   Not on file  Social History Narrative   06/25/21 lives alone, 2 children are in and out all day, has Medical alert   Social Determinants of Health   Financial Resource Strain: Not on file  Food Insecurity: Not on file  Transportation Needs: Not on file  Physical Activity: Not on file  Stress: Not on file  Social Connections: Not on file  Intimate Partner Violence: Not on file     PHYSICAL EXAM  GENERAL EXAM/CONSTITUTIONAL: Vitals:  Vitals:   06/26/21 1146  BP: (!) 139/59  Pulse: 61  Weight: 175 lb 6.4 oz (79.6 kg)  Height: '5\' 10"'$  (1.778 m)   Body mass index is 25.17 kg/m. Wt Readings from Last 3 Encounters:  06/26/21 175 lb 6.4 oz (79.6 kg)  06/17/21 174 lb 6.4 oz (79.1 kg)  05/03/21 174 lb (78.9 kg)   Patient is in no distress; well developed, nourished and groomed; neck is supple  CARDIOVASCULAR: Examination of carotid arteries is normal; no carotid bruits Regular rate and rhythm, no murmurs Examination of peripheral vascular system by observation and palpation is normal  EYES: Ophthalmoscopic exam of optic discs and posterior segments is normal; no papilledema or hemorrhages No results found.  MUSCULOSKELETAL: Gait, strength, tone, movements noted in Neurologic exam below  NEUROLOGIC: MENTAL STATUS:     06/26/2021   11:52 AM  MMSE - Mini Mental State Exam  Orientation to time 3  Orientation to Place 4  Registration 3  Attention/ Calculation 1  Recall 1  Language- name 2 objects 2  Language- repeat 0  Language- follow 3 step command 3  Language- read & follow direction 1  Write a sentence 0  Write a sentence-comments I  can't write.  Copy design 0  Total score 18   awake, alert, oriented to person, place and time recent and remote memory intact normal attention  and concentration language fluent, comprehension intact, naming intact fund of knowledge appropriate  CRANIAL NERVE:  2nd - no papilledema on fundoscopic exam 2nd, 3rd, 4th, 6th - pupils equal  and reactive to light, visual fields full to confrontation, extraocular muscles intact, no nystagmus 5th - facial sensation symmetric 7th - facial strength symmetric 8th - hearing intact 9th - palate elevates symmetrically, uvula midline 11th - shoulder shrug symmetric 12th - tongue protrusion midline  MOTOR:  normal bulk and tone, full strength in the BUE, BLE  SENSORY:  normal and symmetric to light touch, temperature, vibration  COORDINATION:  finger-nose-finger, fine finger movements normal  REFLEXES:  deep tendon reflexes TRACE and symmetric  GAIT/STATION:  narrow based gait; USING WALKER     DIAGNOSTIC DATA (LABS, IMAGING, TESTING) - I reviewed patient records, labs, notes, testing and imaging myself where available.  Lab Results  Component Value Date   WBC 4.3 06/17/2021   HGB 9.8 (L) 06/17/2021   HCT 30.4 (L) 06/17/2021   HCT 30.8 (L) 06/17/2021   MCV 79.6 (L) 06/17/2021   PLT 132 (L) 06/17/2021      Component Value Date/Time   NA 143 06/17/2021 1330   NA 141 12/24/2016 1111   K 4.0 06/17/2021 1330   K 3.9 12/24/2016 1111   CL 108 06/17/2021 1330   CO2 29 06/17/2021 1330   CO2 26 12/24/2016 1111   GLUCOSE 117 (H) 06/17/2021 1330   GLUCOSE 109 12/24/2016 1111   BUN 23 06/17/2021 1330   BUN 10.9 12/24/2016 1111   CREATININE 1.22 06/17/2021 1330   CREATININE 1.1 12/24/2016 1111   CALCIUM 9.2 06/17/2021 1330   CALCIUM 8.8 12/24/2016 1111   PROT 5.8 (L) 06/17/2021 1330   PROT 6.3 (L) 12/24/2016 1111   ALBUMIN 4.1 06/17/2021 1330   ALBUMIN 3.9 12/24/2016 1111   AST 11 (L) 06/17/2021 1330   AST 17 12/24/2016 1111   ALT 9 06/17/2021 1330   ALT 14 12/24/2016 1111   ALKPHOS 108 06/17/2021 1330   ALKPHOS 107 12/24/2016 1111   BILITOT 1.2 06/17/2021 1330    BILITOT 1.64 (H) 12/24/2016 1111   GFRNONAA 58 (L) 06/17/2021 1330   GFRAA 55 (L) 07/27/2019 1054   GFRAA 52 (L) 10/29/2018 1045   No results found for: "CHOL", "HDL", "LDLCALC", "LDLDIRECT", "TRIG", "CHOLHDL" No results found for: "HGBA1C" Lab Results  Component Value Date   VITAMINB12 559 06/17/2021   Lab Results  Component Value Date   TSH 3.659 04/25/2021    04/07/21 CT head / orbits / cervical spine 1. Extensive soft tissue contusion and scalp hematoma in the right frontal scalp and periorbital region where there is also small amount of gas in the preseptal soft tissues, presumably related to the reported laceration. Right globe and retro-orbital soft tissues are grossly normal in appearance. 2. Acute fracture of the medial wall of the right orbit. No other acute displaced facial bone fractures are noted. 3. No other acute skull fracture or signs of significant acute traumatic injury to the brain. 4. Mild-to-moderate cerebral and mild cerebellar atrophy with ex vacuo dilatation of the ventricular system and chronic microvascular ischemic changes in the cerebral white matter. 5. No evidence of significant acute traumatic injury to the cervical spine. 6. Multilevel degenerative disc disease and cervical spondylosis, as above.    ASSESSMENT AND PLAN  88 y.o. year old male here with:   Dx:  1. Moderate dementia without behavioral disturbance, psychotic disturbance, mood disturbance, or anxiety, unspecified dementia type ([place])      PLAN:  MILD-MODERATE DEMENTIA (since ~2022) - safety / supervision issues reviewed - could consider memantine, but not likely much benefit give age, ADLs; also  currently living alone and would be cautious to change meds without more supervision - daily physical activity / exercise (at least 15-30 minutes) - eat more plants / vegetables - increase social activities, brain stimulation, games, puzzles, hobbies, crafts, arts, music - aim  for at least 7-8 hours sleep per night (or more) - avoid smoking and alcohol - caregiver resources provided - caution with living alone, medications, finances; no driving  Return for return to PCP, pending if symptoms worsen or fail to improve.  I spent 46 minutes of face-to-face and non-face-to-face time with patient.  This included previsit chart review, lab review, study review, order entry, electronic health record documentation, patient education.     Penni Bombard, MD 6/94/5038, 88:28 PM Certified in Neurology, Neurophysiology and Neuroimaging  [facility] [address], [place], [place] [address] [phone]

## 2021-06-27 ENCOUNTER — Telehealth: Payer: Self-pay

## 2021-06-27 NOTE — Telephone Encounter (Signed)
Contacted pt's daughter:--to let her know the patient shows as having  Microcytic Anemia from Functional iron deficiency and CKD.  Hgb a little better at 9.8  PLAN  -Iron polysaccharide '150mg'$  po daily to try to target a ferritin closer to 200.  -No indication for EPO at this time  -RTC with Dr Irene Limbo in 4 months with rpt labs.   -if ferritin not improving quickly enough or po iron not tolerated will need to consider IV Iron.   Pt's daughter acknowledged and verbalized understanding.

## 2021-06-28 DIAGNOSIS — R2689 Other abnormalities of gait and mobility: Secondary | ICD-10-CM | POA: Diagnosis not present

## 2021-06-28 DIAGNOSIS — M25511 Pain in right shoulder: Secondary | ICD-10-CM | POA: Diagnosis not present

## 2021-07-02 DIAGNOSIS — M25511 Pain in right shoulder: Secondary | ICD-10-CM | POA: Diagnosis not present

## 2021-07-02 DIAGNOSIS — R2689 Other abnormalities of gait and mobility: Secondary | ICD-10-CM | POA: Diagnosis not present

## 2021-07-05 DIAGNOSIS — M25511 Pain in right shoulder: Secondary | ICD-10-CM | POA: Diagnosis not present

## 2021-07-05 DIAGNOSIS — R2689 Other abnormalities of gait and mobility: Secondary | ICD-10-CM | POA: Diagnosis not present

## 2021-07-11 DIAGNOSIS — R2689 Other abnormalities of gait and mobility: Secondary | ICD-10-CM | POA: Diagnosis not present

## 2021-07-11 DIAGNOSIS — M25511 Pain in right shoulder: Secondary | ICD-10-CM | POA: Diagnosis not present

## 2021-07-13 ENCOUNTER — Other Ambulatory Visit: Payer: Self-pay | Admitting: Cardiology

## 2021-07-15 DIAGNOSIS — M25511 Pain in right shoulder: Secondary | ICD-10-CM | POA: Diagnosis not present

## 2021-07-15 DIAGNOSIS — H6123 Impacted cerumen, bilateral: Secondary | ICD-10-CM | POA: Diagnosis not present

## 2021-07-15 DIAGNOSIS — Z974 Presence of external hearing-aid: Secondary | ICD-10-CM | POA: Diagnosis not present

## 2021-07-18 ENCOUNTER — Other Ambulatory Visit: Payer: 59

## 2021-07-18 DIAGNOSIS — R2689 Other abnormalities of gait and mobility: Secondary | ICD-10-CM | POA: Diagnosis not present

## 2021-07-18 DIAGNOSIS — M25511 Pain in right shoulder: Secondary | ICD-10-CM | POA: Diagnosis not present

## 2021-07-23 DIAGNOSIS — M25511 Pain in right shoulder: Secondary | ICD-10-CM | POA: Diagnosis not present

## 2021-07-23 DIAGNOSIS — R2689 Other abnormalities of gait and mobility: Secondary | ICD-10-CM | POA: Diagnosis not present

## 2021-07-26 ENCOUNTER — Ambulatory Visit: Payer: Medicare Other | Admitting: Cardiology

## 2021-07-26 ENCOUNTER — Encounter: Payer: Self-pay | Admitting: Cardiology

## 2021-07-26 ENCOUNTER — Telehealth: Payer: Self-pay | Admitting: Cardiology

## 2021-07-26 VITALS — BP 140/60 | HR 65 | Temp 98.9°F | Resp 16 | Ht 70.0 in | Wt 179.0 lb

## 2021-07-26 DIAGNOSIS — I6523 Occlusion and stenosis of bilateral carotid arteries: Secondary | ICD-10-CM | POA: Diagnosis not present

## 2021-07-26 DIAGNOSIS — Z950 Presence of cardiac pacemaker: Secondary | ICD-10-CM | POA: Diagnosis not present

## 2021-07-26 DIAGNOSIS — I442 Atrioventricular block, complete: Secondary | ICD-10-CM

## 2021-07-26 DIAGNOSIS — I10 Essential (primary) hypertension: Secondary | ICD-10-CM

## 2021-07-26 DIAGNOSIS — R2689 Other abnormalities of gait and mobility: Secondary | ICD-10-CM | POA: Diagnosis not present

## 2021-07-26 DIAGNOSIS — M25511 Pain in right shoulder: Secondary | ICD-10-CM | POA: Diagnosis not present

## 2021-07-26 NOTE — Progress Notes (Signed)
Primary Physician/Referring:  Deland Pretty, MD  Patient ID: Brandon Fischer, male    DOB: October 17, 1933, 88 y.o.   MRN: [id number]  No chief complaint on file.  HPI:    Brandon Fischer  is a 88 y.o. [place] male with hypertension, hyperlipidemia, SVT ablation in 2016, stage IIIa chronic kidney disease, complete heart block SP Saint Jude/Abbott permanent pacemaker implantation on 04/26/2021.  He is presently doing well presents for 86-monthmonthoffice visit.   Denies chest pain or dyspnea.  He is accompanied by his daughter, no specific complaints.  Past Medical History:  Diagnosis Date   Bilateral carotid artery stenosis without cerebral infarction    ASYMPTOMATIC--  BILATERAL ICA  50-69%  PER CARDIOLOGIST NOTE, DR GEinar Gip  Bilateral carotid bruits    LEFT  soft bruit, right no no carotid bruit per 07-24-2014 dr gEinar Gipnote   CKD (chronic kidney disease)    dr foster cNarda Amberkidney, ckd stage 3 per lov 02-16-2019   [place]16[place]930  Complication of anesthesia    1 episode atrial fib 02-09-2014 [facility] and followed up with cardiology, not further issue   Coronary artery disease    NON-OBSTRUCTIVE CAD  per cath 08-06-2004 HX PALPITATIONS-AND -TACHYCARDIA-   Encounter for care of pacemaker 04/26/2021   First degree heart block    Full dentures    GERD (gastroesophageal reflux disease)    Heart palpitations    Not any more   History of atrial fibrillation without current medication    [facility]  RESOLVED AND PT FOLLOWED UP WITH CARDIOLOGIST  DR GEinar Gip  History of kidney stones    Hyperlipidemia    Hypertension    Memory loss    Nephrolithiasis    RIGHT   Organic impotence    Pacemaker: [employer]UR[employer] DR-RF - [id number]/14/2023   Psoriasis    Clearing up   PSVT (paroxysmal [place]ricular tachycardia) ([place]    Right ureteral calculus    S/P radiofrequency ablation operation for  arrhythmia-SVT 04/28/2014   Simple renal cyst    right   Wears glasses    Wears hearing aid    BILATERAL   Past Surgical History:  Procedure Laterality Date   CARDIAC CATHETERIZATION  08-06-2004 dr kVerlon Setting  (abnormal stress test) Non-obstructive CAD, pLAD 20-30%,  LCX 30-40%, pRCA 20-30%, dRCA 40%, distal LM  mild diffuse calcifation 20-30% tapering stenosis,  preserved LVSF ef 55-60%   CARPAL TUNNEL RELEASE Bilateral 2005   CATARACT EXTRACTION, BILATERAL Bilateral 2011   CHOLECYSTECTOMY     CHOLECYSTECTOMY OPEN  1982   and NEPHROLITHOTOMY   COLONOSCOPY W/ POLYPECTOMY  last one 04-05-2008   CYSTOSCOPY WITH LITHOLAPAXY Right 04/14/2017   Procedure: CYSTOSCOPY WITH LITHOLAPAXY/RIGHT RETROGRADE PLYOGRAM/RIGHT URETEROSCOPY;  Surgeon: WIrine Seal MD;  Location: [facility];  Service: Urology;  Laterality: Right;   CYSTOSCOPY WITH RETROGRADE PYELOGRAM, URETEROSCOPY AND STENT PLACEMENT Right 03/14/2014   Procedure: CYSTO/RIGHT RETROGRADE PYELOGRAM/RIGHT URETEROSCOPY/STONE EXTRACTION/STENT PLACEMENT;  Surgeon: JMalka So MD;  [facility]  Service: Urology;  Laterality: Right;   CYSTOSCOPY WITH RETROGRADE PYELOGRAM, URETEROSCOPY AND STENT PLACEMENT Left 04/19/2019   Procedure: CYSTOSCOPY WITH LEFT  RETROGRADE LEFT , URETEROSCOPY WITH HOLMIUM LASER, BASKET EXTRACTION AND STENT PLACEMENT;  Surgeon: WIrine Seal MD;  [facility]  Service: Urology;  Laterality: Left;   CYSTOSCOPY WITH RETROGRADE PYELOGRAM, URETEROSCOPY AND STENT PLACEMENT Left 03/30/2020  Procedure: CYSTOSCOPY WITH RETROGRADE PYELOGRAM, URETEROSCOPY, BASKETTING OF STONE AND STENT PLACEMENT;  Surgeon: Alexis Frock, MD;  Location: [facility];  Service: Urology;  Laterality: Left;   EYE SURGERY Bilateral    Cataracts removed   HOLMIUM LASER APPLICATION Right 15/05/6977   Procedure: RIGHT HOLMIUM LASER APPLICATION;  Surgeon: Malka So, MD;  Location: [facility];  Service: Urology;  Laterality: Right;   KNEE ARTHROSCOPY W/ DEBRIDEMENT Right 11/23/2002   and Synovectomy   LUMBAR LAMINECTOMY/DECOMPRESSION MICRODISCECTOMY N/A 07/15/2018   Procedure: Posterior lumbar decompression and fusion L4-5;  Surgeon: Melina Schools, MD;  Location: [place];  Service: Orthopedics;  Laterality: N/A;  4 hrs   NEPHROLITHOTOMY  01/16/2011   Procedure: NEPHROLITHOTOMY PERCUTANEOUS;  Surgeon: Malka So;  Location[facility];  Service: Urology;  Laterality: Left;  C-Arm  Holmium Laser   NEPHROLITHOTOMY  1981   PACEMAKER IMPLANT N/A 04/26/2021   Procedure: PACEMAKER IMPLANT;  Surgeon: Constance Haw, MD;  Location: [place] [facility];  Service: Cardiovascular;  Laterality: N/A;   SUPRAVENTRICULAR TACHYCARDIA ABLATION N/A 04/28/2014   Procedure: SUPRAVENTRICULAR TACHYCARDIA ABLATION;  Surgeon: Evans Lance, MD;  Location: [facility];  Service: Cardiovascular;  Laterality: N/A;   Family History  Problem Relation Age of Onset   Cancer Mother        Ovarian or colon   Heart attack Father    Heart attack Brother     Social History   Tobacco Use   Smoking status: Former    Packs/day: 1.00    Years: 40.00    Total pack years: 40.00    Types: Cigarettes    Quit date: 02/06/1998    Years since quitting: 23.4   Smokeless tobacco: Never  Substance Use Topics   Alcohol use: No   Marital Status: Widowed  ROS  Review of Systems  HENT:  Positive for hearing loss.   Cardiovascular:  Negative for chest pain, dyspnea on exertion, leg swelling and syncope (no recurrence).  Gastrointestinal:  Negative for melena.   Objective  Blood pressure (!) 156/58, pulse 65, temperature 98.9 F (37.2 C), temperature source Temporal, resp. rate 16, height 5' 10"  (1.778 m), weight 179 lb (81.2 kg), SpO2 100 %. Body mass index is 25.68 kg/m.      07/26/2021   10:41 AM 06/26/2021   11:46 AM 06/17/2021    1:03 PM  Vitals with BMI  Height 5' 10"  5' 10"  5' 10"   Weight  179 lbs 175 lbs 6 oz 174 lbs 6 oz  BMI 25.68 48.01 65.53  Systolic 748 270 786  Diastolic 58 59 52  Pulse 65 61 64     Physical Exam Vitals reviewed.  Neck:     Vascular: No JVD.  Cardiovascular:     Rate and Rhythm: Normal rate and regular rhythm.     Pulses: Normal pulses and intact distal pulses.          Carotid pulses are  on the right side with bruit and  on the left side with bruit.    Heart sounds: S1 normal and S2 normal. Murmur heard.     Crescendo systolic murmur is present with a grade of 2/6 at the upper right sternal border.     Early diastolic murmur is present with a grade of 2/4 at the upper right sternal border.     No gallop.     Comments: Pacemaker site well-healed Pulmonary:     Effort: Pulmonary  effort is normal.     Breath sounds: Normal breath sounds.  Abdominal:     General: Bowel sounds are normal.     Palpations: Abdomen is soft.  Musculoskeletal:     Right lower leg: No edema.     Left lower leg: No edema.    Laboratory examination:   Recent Labs    04/25/21 1343 04/25/21 1440 06/17/21 1330  NA 143 143 143  K 4.0 3.9 4.0  CL 110 111 108  CO2 27 24 29   GLUCOSE 112* 108* 117*  BUN 13 14 23   CREATININE 1.39* 1.39* 1.22  CALCIUM 8.2* 8.4* 9.2  GFRNONAA 49* 49* 58*   CrCl cannot be calculated (Patient's most recent lab result is older than the maximum 21 days allowed.).     Latest Ref Rng & Units 06/17/2021    1:30 PM 04/25/2021    2:40 PM 04/25/2021    1:43 PM  CMP  Glucose 70 - 99 mg/dL 117  108  112   BUN 8 - 23 mg/dL 23  14  13    Creatinine 0.61 - 1.24 mg/dL 1.22  1.39  1.39   Sodium 135 - 145 mmol/L 143  143  143   Potassium 3.5 - 5.1 mmol/L 4.0  3.9  4.0   Chloride 98 - 111 mmol/L 108  111  110   CO2 22 - 32 mmol/L 29  24  27    Calcium 8.9 - 10.3 mg/dL 9.2  8.4  8.2   Total Protein 6.5 - 8.1 g/dL 5.8  5.9  5.5   Total Bilirubin 0.3 - 1.2 mg/dL 1.2  1.5  1.5   Alkaline Phos 38 - 126 U/L 108  120  118   AST 15 - 41 U/L 11  16  13     ALT 0 - 44 U/L 9  18  16        Latest Ref Rng & Units 06/17/2021    1:30 PM 04/25/2021    2:40 PM 04/25/2021    1:43 PM  CBC  WBC 4.0 - 10.5 K/uL 4.3  5.4  5.6   Hemoglobin 13.0 - 17.0 g/dL 9.8  9.1  9.0   Hematocrit 37.5 - 51.0 % 39.0 - 52.0 % 30.4    30.8  30.3  29.8   Platelets 150 - 400 K/uL 132  248  264     Recent Labs    04/07/21 0922 04/25/21 1455  TSH 2.920 3.659     External labs:   Lab 05/12/2020:  TSH mildly elevated at 7.140.  Vitamin B12 866.  Hb 10.7/HCT 33.6, platelets 143 (140-400).  Microcytic indicis.  Serum glucose 104 mg, BUN 17, creatinine 1.47, EGFR 46 mL, potassium 4.6, CMP otherwise normal.  Total cholesterol 121, triglycerides 63, HDL 47, LDL 61. Allergies   Allergies  Allergen Reactions   Bupropion     Other reaction(s): disoriented   Olmesartan     Other reaction(s): rash   Trazodone     Other reaction(s): unsteady gait 06/26/21 sometimes takes 1 as needed     Final Medications at End of Visit     Current Outpatient Medications:    Ascorbic Acid (VITAMIN C PO), Take 1 tablet by mouth daily., Disp: , Rfl:    aspirin (ASPIRIN CHILDRENS) 81 MG chewable tablet, Chew 1 tablet (81 mg total) by mouth daily., Disp: , Rfl:    atorvastatin (LIPITOR) 20 MG tablet, Take 10 mg by mouth daily., Disp: , Rfl:  Cholecalciferol (VITAMIN D-3) 25 MCG (1000 UT) CAPS, Take 1,000 Units by mouth daily with breakfast., Disp: , Rfl:    Ferrous Sulfate (IRON) 28 MG TABS, daily., Disp: , Rfl:    hydrALAZINE (APRESOLINE) 10 MG tablet, Take 1 tablet (10 mg total) by mouth 3 (three) times daily., Disp: 90 tablet, Rfl: 0   losartan (COZAAR) 50 MG tablet, Take 50 mg by mouth daily., Disp: , Rfl:    metoprolol succinate (TOPROL-XL) 25 MG 24 hr tablet, TAKE 1 TABLET (25 MG TOTAL) BY MOUTH DAILY., Disp: 90 tablet, Rfl: 3   pantoprazole (PROTONIX) 40 MG tablet, Take 40 mg by mouth in the morning and at bedtime. , Disp: , Rfl:    tamsulosin (FLOMAX) 0.4 MG CAPS  capsule, Take 0.4 mg by mouth daily after supper., Disp: , Rfl:    traMADol (ULTRAM) 50 MG tablet, Take 1 tablet (50 mg total) by mouth every 6 (six) hours as needed for moderate pain or severe pain. Post-operatively, Disp: 15 tablet, Rfl: 0   traZODone (DESYREL) 50 MG tablet, Take 50-100 mg by mouth daily as needed., Disp: , Rfl:    zinc gluconate 50 MG tablet, Take 50 mg by mouth daily., Disp: , Rfl:    Radiology:   No results found.  Cardiac Studies:   Echocardiogram 06/19/2020: Normal LV systolic function with visual EF 55-60%. Left ventricle cavity is normal in size. Moderate left ventricular hypertrophy. Normal global wall motion. Doppler evidence of grade II (pseudonormal) diastolic dysfunction, elevated LAP. Left atrial cavity is severely dilated. Mild (Grade I) aortic regurgitation. Mild (Grade I) mitral regurgitation. Mild tricuspid regurgitation. Mild pulmonary hypertension. RVSP measures 42 mmHg. IVC is dilated with a respiratory response of <50%. No prior study for comparison.  Carotid artery duplex 06/20/2021: Duplex suggests stenosis in the right internal carotid artery (1-15%). Duplex suggests stenosis in the right external carotid artery (<50%). Duplex suggests stenosis in the left internal carotid artery (1-15%). Antegrade right vertebral artery flow. Antegrade left vertebral artery flow. Compared to 06/19/20, overall no significant change, previously left ICA 15-49% stenosis. Follow up is appropriate if clinically indicated.  Pacemaker:   Pacemaker implantation 04/26/2021:   1. Successful implantation of a St Jude Medical Assurity MRI dual-chamber pacemaker for symptomatic bradycardia  2. No early apparent complications.   EKG:   EKG 07/26/2021: Atrially paced rhythm, ventricularly sensed rhythm at the rate of 74 bpm.  Inferior infarct old.  Anteroseptal infarct old.  LVH.  Single PVC.  05/03/2021: Ventricularly paced rhythm with PVCs at a rate of 70 bpm.  No  further analysis.   Assessment     ICD-10-CM   1. Complete heart block (HCC)  I44.2 EKG 12-Lead    2. Primary hypertension  I10     3. Pacemaker: Abbott Dual [place] DR-RF - [id number]  Z95.0     4. Carotid atherosclerosis, bilateral  I65.23       There are no discontinued medications.   No orders of the defined types were placed in this encounter.   Orders Placed This Encounter  Procedures   EKG 12-Lead   Recommendations:   Brandon Fischer is a 88 y.o. Caucasian male with hypertension, hyperlipidemia, SVT ablation in 2016, stage IIIa chronic kidney disease, complete heart block SP Saint Jude/Abbott permanent pacemaker implantation on 04/26/2021.  He is presently doing well presents for 35-monthoffice visit.  Remains asymptomatic, generally weak and uses a walker to walk.  No chest pain, no PND or orthopnea, no  leg edema.  No clinical evidence of heart failure.  Carotid artery duplex shows mild atherosclerosis.  Patient being 88 years of age, no further evaluation from cardiac standpoint or vascular standpoint is indicated except supportive care.  Fall precautions were discussed with the patient and his daughter.  No changes in the medications were done today.  I will see him back on an annual basis.  I will request [facility] to release his pacemaker to our clinic for follow-up remotely.  Patient and his daughter wish the same.   Adrian Prows, MD, [facility] 07/26/2021, 11:37 AM Office: [phone] Fax: [phone] Pager: [phone]

## 2021-07-29 DIAGNOSIS — I442 Atrioventricular block, complete: Secondary | ICD-10-CM | POA: Diagnosis not present

## 2021-07-29 DIAGNOSIS — Z45018 Encounter for adjustment and management of other part of cardiac pacemaker: Secondary | ICD-10-CM | POA: Diagnosis not present

## 2021-07-29 NOTE — Telephone Encounter (Signed)
I like you Brandon Fischer, that is why I keep writing to you! I am sorry for your time here. I see that he is in fact enrolled in our system. Thank you again

## 2021-07-29 NOTE — Telephone Encounter (Signed)
Good afternoon, We never accepted the patient remotes. We put a note in that you was following the patient remotes. He is not in our system. I'm not sure why he is not coming up in your system.

## 2021-07-29 NOTE — Telephone Encounter (Signed)
Thank you so much. I hope you have a great day.

## 2021-07-30 DIAGNOSIS — M25511 Pain in right shoulder: Secondary | ICD-10-CM | POA: Diagnosis not present

## 2021-07-30 DIAGNOSIS — R2689 Other abnormalities of gait and mobility: Secondary | ICD-10-CM | POA: Diagnosis not present

## 2021-08-01 DIAGNOSIS — M25511 Pain in right shoulder: Secondary | ICD-10-CM | POA: Diagnosis not present

## 2021-08-01 DIAGNOSIS — R2689 Other abnormalities of gait and mobility: Secondary | ICD-10-CM | POA: Diagnosis not present

## 2021-08-05 ENCOUNTER — Encounter: Payer: Medicare Other | Admitting: Cardiology

## 2021-08-07 DIAGNOSIS — M7989 Other specified soft tissue disorders: Secondary | ICD-10-CM | POA: Diagnosis not present

## 2021-08-07 DIAGNOSIS — M25411 Effusion, right shoulder: Secondary | ICD-10-CM | POA: Diagnosis not present

## 2021-08-08 DIAGNOSIS — S40011A Contusion of right shoulder, initial encounter: Secondary | ICD-10-CM | POA: Diagnosis not present

## 2021-08-12 DIAGNOSIS — M25511 Pain in right shoulder: Secondary | ICD-10-CM | POA: Diagnosis not present

## 2021-08-12 DIAGNOSIS — R2689 Other abnormalities of gait and mobility: Secondary | ICD-10-CM | POA: Diagnosis not present

## 2021-08-14 DIAGNOSIS — M25511 Pain in right shoulder: Secondary | ICD-10-CM | POA: Diagnosis not present

## 2021-08-14 DIAGNOSIS — R2689 Other abnormalities of gait and mobility: Secondary | ICD-10-CM | POA: Diagnosis not present

## 2021-08-28 DIAGNOSIS — R2689 Other abnormalities of gait and mobility: Secondary | ICD-10-CM | POA: Diagnosis not present

## 2021-08-28 DIAGNOSIS — M25511 Pain in right shoulder: Secondary | ICD-10-CM | POA: Diagnosis not present

## 2021-09-02 DIAGNOSIS — R2689 Other abnormalities of gait and mobility: Secondary | ICD-10-CM | POA: Diagnosis not present

## 2021-09-02 DIAGNOSIS — M25511 Pain in right shoulder: Secondary | ICD-10-CM | POA: Diagnosis not present

## 2021-09-05 DIAGNOSIS — M25511 Pain in right shoulder: Secondary | ICD-10-CM | POA: Diagnosis not present

## 2021-09-05 DIAGNOSIS — R2689 Other abnormalities of gait and mobility: Secondary | ICD-10-CM | POA: Diagnosis not present

## 2021-09-10 DIAGNOSIS — R2689 Other abnormalities of gait and mobility: Secondary | ICD-10-CM | POA: Diagnosis not present

## 2021-09-10 DIAGNOSIS — M25511 Pain in right shoulder: Secondary | ICD-10-CM | POA: Diagnosis not present

## 2021-09-17 DIAGNOSIS — R2689 Other abnormalities of gait and mobility: Secondary | ICD-10-CM | POA: Diagnosis not present

## 2021-09-17 DIAGNOSIS — M25511 Pain in right shoulder: Secondary | ICD-10-CM | POA: Diagnosis not present

## 2021-09-19 DIAGNOSIS — R2689 Other abnormalities of gait and mobility: Secondary | ICD-10-CM | POA: Diagnosis not present

## 2021-09-19 DIAGNOSIS — M25511 Pain in right shoulder: Secondary | ICD-10-CM | POA: Diagnosis not present

## 2021-09-20 ENCOUNTER — Encounter: Payer: Self-pay | Admitting: Cardiology

## 2021-09-20 ENCOUNTER — Ambulatory Visit: Payer: Medicare Other | Attending: Cardiology | Admitting: Cardiology

## 2021-09-20 VITALS — BP 136/59 | HR 47 | Ht 70.0 in | Wt 172.4 lb

## 2021-09-20 DIAGNOSIS — I442 Atrioventricular block, complete: Secondary | ICD-10-CM | POA: Diagnosis not present

## 2021-09-20 DIAGNOSIS — I6523 Occlusion and stenosis of bilateral carotid arteries: Secondary | ICD-10-CM

## 2021-09-20 NOTE — Patient Instructions (Signed)
Medication Instructions:  Your physician recommends that you continue on your current medications as directed. Please refer to the Current Medication list given to you today.  *If you need a refill on your cardiac medications before your next appointment, please call your pharmacy*   Lab Work: None ordered If you have labs (blood work) drawn today and your tests are completely normal, you will receive your results only by: MyChart Message (if you have MyChart) OR A paper copy in the mail If you have any lab test that is abnormal or we need to change your treatment, we will call you to review the results.   Testing/Procedures: None ordered   Follow-Up: At [facility], you and your health needs are our priority.  As part of our continuing mission to provide you with exceptional heart care, we have created designated Provider Care Teams.  These Care Teams include your primary Cardiologist (physician) and Advanced Practice Providers (APPs -  Physician Assistants and Nurse Practitioners) who all work together to provide you with the care you need, when you need it.  We recommend signing up for the patient portal called "MyChart".  Sign up information is provided on this After Visit Summary.  MyChart is used to connect with patients for Virtual Visits (Telemedicine).  Patients are able to view lab/test results, encounter notes, upcoming appointments, etc.  Non-urgent messages can be sent to your provider as well.   To learn more about what you can do with MyChart, go to [email or web address].    Your next appointment:   as  needed  The format for your next appointment:   In Person  Provider:   Will Camnitz, MD    Thank you for choosing [facility]!!   Madilyne Tadlock, RN [phone]  Other Instructions   Important Information About Sugar           

## 2021-09-20 NOTE — Progress Notes (Signed)
Electrophysiology Office Note   Date:  09/20/2021   ID:  Brandon Fischer, DOB 17-Apr-1933, MRN [id number]  PCP:  Deland Pretty, MD  Cardiologist:  Einar Gip Primary Electrophysiologist:  Rashika Bettes Meredith Leeds, MD    Chief Complaint: pacemaker   History of Present Illness: Brandon Fischer is a 88 y.o. male who is being seen today for the evaluation of pacemaker at the request of Deland Pretty, MD. Presenting today for electrophysiology evaluation.  He has a history significant hypertension, hyperlipidemia, SVT with prior ablation.  He presented to the hospital with complete heart block.  He is now status post Abbott dual-chamber pacemaker implanted 04/26/2021.  Today, he denies symptoms of palpitations, chest pain, shortness of breath, orthopnea, PND, lower extremity edema, claudication, dizziness, presyncope, syncope, bleeding, or neurologic sequela. The patient is tolerating medications without difficulties.    Past Medical History:  Diagnosis Date   Bilateral carotid artery stenosis without cerebral infarction    ASYMPTOMATIC--  BILATERAL ICA  50-69%  PER CARDIOLOGIST NOTE, DR Einar Gip   Bilateral carotid bruits    LEFT  soft bruit, right no no carotid bruit per 07-24-2014 dr Einar Gip note   CKD (chronic kidney disease)    dr foster Narda Amber kidney, ckd stage 3 per lov 02-16-2019   Complete heart block ([place]) 72/53/6644   Complication of anesthesia    1 episode atrial fib 02-09-2014 st [facility] and followed up with cardiology, not further issue   Coronary artery disease    NON-OBSTRUCTIVE CAD  per cath 08-06-2004 HX PALPITATIONS-AND -TACHYCARDIA-   Encounter for care of pacemaker 04/26/2021   First degree heart block    Full dentures    GERD (gastroesophageal reflux disease)    Heart palpitations    Not any more   History of atrial fibrillation without current medication    EPISODE OF AFIB WITH RVR INTRAOPERATIVELY 02-09-2014 AT [facility]--  RESOLVED AND PT FOLLOWED UP WITH CARDIOLOGIST  DR Einar Gip    History of kidney stones    Hyperlipidemia    Hypertension    Memory loss    Nephrolithiasis    RIGHT   Organic impotence    Pacemaker: [employer] ASSURITY DR-RF - [id number] 04/26/2021   Psoriasis    Clearing up   PSVT (paroxysmal supraventricular tachycardia) ([place])    Right ureteral calculus    S/P radiofrequency ablation operation for arrhythmia-SVT 04/28/2014   Simple renal cyst    right   Wears glasses    Wears hearing aid    BILATERAL   Past Surgical History:  Procedure Laterality Date   CARDIAC CATHETERIZATION  08-06-2004 dr Verlon Setting   (abnormal stress test) Non-obstructive CAD, pLAD 20-30%,  LCX 30-40%, pRCA 20-30%, dRCA 40%, distal LM  mild diffuse calcifation 20-30% tapering stenosis,  preserved LVSF ef 55-60%   CARPAL TUNNEL RELEASE Bilateral 2005   CATARACT EXTRACTION, BILATERAL Bilateral 2011   CHOLECYSTECTOMY     CHOLECYSTECTOMY OPEN  1982   and NEPHROLITHOTOMY   COLONOSCOPY W/ POLYPECTOMY  last one 04-05-2008   CYSTOSCOPY WITH LITHOLAPAXY Right 04/14/2017   Procedure: CYSTOSCOPY WITH LITHOLAPAXY/RIGHT RETROGRADE PLYOGRAM/RIGHT URETEROSCOPY;  Surgeon: [name], MD;  Location: [facility];  Service: Urology;  Laterality: Right;   CYSTOSCOPY WITH RETROGRADE PYELOGRAM, URETEROSCOPY AND STENT PLACEMENT Right 03/14/2014   Procedure: CYSTO/RIGHT RETROGRADE PYELOGRAM/RIGHT URETEROSCOPY/STONE EXTRACTION/STENT PLACEMENT;  Surgeon: Malka So, MD;  Location: [facility];  Service: Urology;  Laterality: Right;   CYSTOSCOPY WITH RETROGRADE PYELOGRAM, URETEROSCOPY AND STENT PLACEMENT Left 04/19/2019  Procedure: CYSTOSCOPY WITH LEFT  RETROGRADE LEFT , URETEROSCOPY WITH HOLMIUM LASER, BASKET EXTRACTION AND STENT PLACEMENT;  Surgeon: [name], MD;  Location: [facility];  Service: Urology;  Laterality: Left;   CYSTOSCOPY WITH RETROGRADE PYELOGRAM, URETEROSCOPY AND STENT PLACEMENT Left 03/30/2020   Procedure: CYSTOSCOPY WITH  RETROGRADE PYELOGRAM, URETEROSCOPY, BASKETTING OF STONE AND STENT PLACEMENT;  Surgeon: Alexis Frock, MD;  Location: [facility];  Service: Urology;  Laterality: Left;   EYE SURGERY Bilateral    Cataracts removed   HOLMIUM LASER APPLICATION Right 27/06/2374   Procedure: RIGHT HOLMIUM LASER APPLICATION;  Surgeon: Malka So, MD;  Location: [facility];  Service: Urology;  Laterality: Right;   KNEE ARTHROSCOPY W/ DEBRIDEMENT Right 11/23/2002   and Synovectomy   LUMBAR LAMINECTOMY/DECOMPRESSION MICRODISCECTOMY N/A 07/15/2018   Procedure: Posterior lumbar decompression and fusion L4-5;  Surgeon: Melina Schools, MD;  Location: [place];  Service: Orthopedics;  Laterality: N/A;  4 hrs   NEPHROLITHOTOMY  01/16/2011   Procedure: NEPHROLITHOTOMY PERCUTANEOUS;  Surgeon: Malka So;  Location: [facility];  Service: Urology;  Laterality: Left;  C-Arm  Holmium Laser   NEPHROLITHOTOMY  1981   PACEMAKER IMPLANT N/A 04/26/2021   Procedure: PACEMAKER IMPLANT;  Surgeon: Constance Haw, MD;  Location: [place] [facility];  Service: Cardiovascular;  Laterality: N/A;   SUPRAVENTRICULAR TACHYCARDIA ABLATION N/A 04/28/2014   Procedure: SUPRAVENTRICULAR TACHYCARDIA ABLATION;  Surgeon: Evans Lance, MD;  Location: [facility];  Service: Cardiovascular;  Laterality: N/A;     Current Outpatient Medications  Medication Sig Dispense Refill   Ascorbic Acid (VITAMIN C PO) Take 1 tablet by mouth daily.     aspirin (ASPIRIN CHILDRENS) 81 MG chewable tablet Chew 1 tablet (81 mg total) by mouth daily.     atorvastatin (LIPITOR) 20 MG tablet Take 10 mg by mouth daily.     Cholecalciferol (VITAMIN D-3) 25 MCG (1000 UT) CAPS Take 1,000 Units by mouth daily with breakfast.     Ferrous Sulfate (IRON) 28 MG TABS daily.     hydrALAZINE (APRESOLINE) 10 MG tablet Take 1 tablet (10 mg total) by mouth 3 (three) times daily. 90 tablet 0   losartan (COZAAR) 50 MG tablet Take 50 mg by mouth daily.      metoprolol succinate (TOPROL-XL) 25 MG 24 hr tablet TAKE 1 TABLET (25 MG TOTAL) BY MOUTH DAILY. 90 tablet 3   pantoprazole (PROTONIX) 40 MG tablet Take 40 mg by mouth in the morning and at bedtime.      tamsulosin (FLOMAX) 0.4 MG CAPS capsule Take 0.4 mg by mouth daily after supper.     traMADol (ULTRAM) 50 MG tablet Take 1 tablet (50 mg total) by mouth every 6 (six) hours as needed for moderate pain or severe pain. Post-operatively 15 tablet 0   traZODone (DESYREL) 50 MG tablet Take 50-100 mg by mouth daily as needed.     zinc gluconate 50 MG tablet Take 50 mg by mouth daily.     No current facility-administered medications for this visit.    Allergies:   Bupropion, Olmesartan, and Trazodone   Social History:  The patient  reports that he quit smoking about 23 years ago. His smoking use included cigarettes. He has a 40.00 pack-year smoking history. He has never used smokeless tobacco. He reports that he does not drink alcohol and does not use drugs.   Family History:  The patient's family history includes Cancer in his mother; Heart attack in his  brother and father.    ROS:  Please see the history of present illness.   Otherwise, review of systems is positive for none.   All other systems are reviewed and negative.    PHYSICAL EXAM: VS:  BP (!) 136/59   Pulse (!) 47   Ht '5\' 10"'$  (1.778 m)   Wt 172 lb 6.4 oz (78.2 kg)   SpO2 98%   BMI 24.74 kg/m  , BMI Body mass index is 24.74 kg/m. GEN: Well nourished, well developed, in no acute distress  HEENT: normal  Neck: no JVD, carotid bruits, or masses Cardiac: RRR; no murmurs, rubs, or gallops,no edema  Respiratory:  clear to auscultation bilaterally, normal work of breathing GI: soft, nontender, nondistended, + BS MS: no deformity or atrophy  Skin: warm and dry, device pocket is well healed Neuro:  Strength and sensation are intact Psych: euthymic mood, full affect  EKG:  EKG is not ordered today. Personal review of the ekg ordered  07/26/21 shows sinus rhythm  Device interrogation is reviewed today in detail.  See PaceArt for details.   Recent Labs: 04/25/2021: TSH 3.659 06/17/2021: ALT 9; BUN 23; Creatinine 1.22; Hemoglobin 9.8; Platelet Count 132; Potassium 4.0; Sodium 143    Lipid Panel  No results found for: "CHOL", "TRIG", "HDL", "CHOLHDL", "VLDL", "LDLCALC", "LDLDIRECT"   Wt Readings from Last 3 Encounters:  09/20/21 172 lb 6.4 oz (78.2 kg)  07/26/21 179 lb (81.2 kg)  06/26/21 175 lb 6.4 oz (79.6 kg)      Other studies Reviewed: Additional studies/ records that were reviewed today include: TTE 06/24/20  Review of the above records today demonstrates:  Normal LV systolic function with visual EF 55-60%. Left ventricle cavity  is normal in size. Moderate left ventricular hypertrophy. Normal global  wall motion. Doppler evidence of grade II (pseudonormal) diastolic  dysfunction, elevated LAP.  Left atrial cavity is severely dilated.  Mild (Grade I) aortic regurgitation.  Mild (Grade I) mitral regurgitation.  Mild tricuspid regurgitation. Mild pulmonary hypertension. RVSP measures  42 mmHg.  IVC is dilated with a respiratory response of <50%.  No prior study for comparison.   ASSESSMENT AND PLAN:  1.  Complete AV block: Status post Abbott dual-chamber pacemaker.  Device function appropriately.  No changes at this time.    Current medicines are reviewed at length with the patient today.   The patient does not have concerns regarding his medicines.  The following changes were made today:  none  Labs/ tests ordered today include:  No orders of the defined types were placed in this encounter.    Disposition:   FU with Inola Lisle 9 months  Signed, Shereka Lafortune Meredith Leeds, MD  09/20/2021 3:45 PM     [address] [place] [id number] [phone] (office) [phone] (fax)

## 2021-09-24 DIAGNOSIS — R2689 Other abnormalities of gait and mobility: Secondary | ICD-10-CM | POA: Diagnosis not present

## 2021-09-24 DIAGNOSIS — M25511 Pain in right shoulder: Secondary | ICD-10-CM | POA: Diagnosis not present

## 2021-09-26 DIAGNOSIS — M25511 Pain in right shoulder: Secondary | ICD-10-CM | POA: Diagnosis not present

## 2021-09-26 DIAGNOSIS — R2689 Other abnormalities of gait and mobility: Secondary | ICD-10-CM | POA: Diagnosis not present

## 2021-10-01 DIAGNOSIS — R2689 Other abnormalities of gait and mobility: Secondary | ICD-10-CM | POA: Diagnosis not present

## 2021-10-01 DIAGNOSIS — M25511 Pain in right shoulder: Secondary | ICD-10-CM | POA: Diagnosis not present

## 2021-10-03 DIAGNOSIS — R2689 Other abnormalities of gait and mobility: Secondary | ICD-10-CM | POA: Diagnosis not present

## 2021-10-03 DIAGNOSIS — M25511 Pain in right shoulder: Secondary | ICD-10-CM | POA: Diagnosis not present

## 2021-10-08 DIAGNOSIS — R2689 Other abnormalities of gait and mobility: Secondary | ICD-10-CM | POA: Diagnosis not present

## 2021-10-08 DIAGNOSIS — M25511 Pain in right shoulder: Secondary | ICD-10-CM | POA: Diagnosis not present

## 2021-10-10 DIAGNOSIS — R2689 Other abnormalities of gait and mobility: Secondary | ICD-10-CM | POA: Diagnosis not present

## 2021-10-10 DIAGNOSIS — M25511 Pain in right shoulder: Secondary | ICD-10-CM | POA: Diagnosis not present

## 2021-10-11 DIAGNOSIS — H6123 Impacted cerumen, bilateral: Secondary | ICD-10-CM | POA: Diagnosis not present

## 2021-10-15 DIAGNOSIS — R2689 Other abnormalities of gait and mobility: Secondary | ICD-10-CM | POA: Diagnosis not present

## 2021-10-15 DIAGNOSIS — M25511 Pain in right shoulder: Secondary | ICD-10-CM | POA: Diagnosis not present

## 2021-10-16 DIAGNOSIS — D225 Melanocytic nevi of trunk: Secondary | ICD-10-CM | POA: Diagnosis not present

## 2021-10-16 DIAGNOSIS — Z08 Encounter for follow-up examination after completed treatment for malignant neoplasm: Secondary | ICD-10-CM | POA: Diagnosis not present

## 2021-10-16 DIAGNOSIS — L814 Other melanin hyperpigmentation: Secondary | ICD-10-CM | POA: Diagnosis not present

## 2021-10-16 DIAGNOSIS — D485 Neoplasm of uncertain behavior of skin: Secondary | ICD-10-CM | POA: Diagnosis not present

## 2021-10-16 DIAGNOSIS — L578 Other skin changes due to chronic exposure to nonionizing radiation: Secondary | ICD-10-CM | POA: Diagnosis not present

## 2021-10-16 DIAGNOSIS — D034 Melanoma in situ of scalp and neck: Secondary | ICD-10-CM | POA: Diagnosis not present

## 2021-10-16 DIAGNOSIS — L821 Other seborrheic keratosis: Secondary | ICD-10-CM | POA: Diagnosis not present

## 2021-10-16 DIAGNOSIS — L570 Actinic keratosis: Secondary | ICD-10-CM | POA: Diagnosis not present

## 2021-10-16 DIAGNOSIS — Z86006 Personal history of melanoma in-situ: Secondary | ICD-10-CM | POA: Diagnosis not present

## 2021-10-17 DIAGNOSIS — M25511 Pain in right shoulder: Secondary | ICD-10-CM | POA: Diagnosis not present

## 2021-10-17 DIAGNOSIS — R2689 Other abnormalities of gait and mobility: Secondary | ICD-10-CM | POA: Diagnosis not present

## 2021-10-18 ENCOUNTER — Other Ambulatory Visit: Payer: Self-pay

## 2021-10-18 DIAGNOSIS — D649 Anemia, unspecified: Secondary | ICD-10-CM

## 2021-10-21 ENCOUNTER — Inpatient Hospital Stay: Payer: Medicare Other

## 2021-10-21 ENCOUNTER — Inpatient Hospital Stay: Payer: Medicare Other | Attending: Hematology | Admitting: Hematology

## 2021-10-21 ENCOUNTER — Other Ambulatory Visit: Payer: Self-pay

## 2021-10-21 VITALS — BP 161/60 | HR 74 | Temp 97.7°F | Resp 18 | Ht 70.0 in | Wt 173.8 lb

## 2021-10-21 DIAGNOSIS — D649 Anemia, unspecified: Secondary | ICD-10-CM

## 2021-10-21 LAB — CBC WITH DIFFERENTIAL (CANCER CENTER ONLY)
Abs Immature Granulocytes: 0.02 10*3/uL (ref 0.00–0.07)
Basophils Absolute: 0 10*3/uL (ref 0.0–0.1)
Basophils Relative: 0 %
Eosinophils Absolute: 0.1 10*3/uL (ref 0.0–0.5)
Eosinophils Relative: 1 %
HCT: 30.1 % — ABNORMAL LOW (ref 39.0–52.0)
Hemoglobin: 9.9 g/dL — ABNORMAL LOW (ref 13.0–17.0)
Immature Granulocytes: 1 %
Lymphocytes Relative: 21 %
Lymphs Abs: 0.9 10*3/uL (ref 0.7–4.0)
MCH: 25.6 pg — ABNORMAL LOW (ref 26.0–34.0)
MCHC: 32.9 g/dL (ref 30.0–36.0)
MCV: 77.8 fL — ABNORMAL LOW (ref 80.0–100.0)
Monocytes Absolute: 0.3 10*3/uL (ref 0.1–1.0)
Monocytes Relative: 7 %
Neutro Abs: 2.9 10*3/uL (ref 1.7–7.7)
Neutrophils Relative %: 70 %
Platelet Count: 134 10*3/uL — ABNORMAL LOW (ref 150–400)
RBC: 3.87 MIL/uL — ABNORMAL LOW (ref 4.22–5.81)
RDW: 16 % — ABNORMAL HIGH (ref 11.5–15.5)
WBC Count: 4.2 10*3/uL (ref 4.0–10.5)
nRBC: 0 % (ref 0.0–0.2)

## 2021-10-21 LAB — IRON AND IRON BINDING CAPACITY (CC-WL,HP ONLY)
Iron: 111 ug/dL (ref 45–182)
Saturation Ratios: 49 % — ABNORMAL HIGH (ref 17.9–39.5)
TIBC: 228 ug/dL — ABNORMAL LOW (ref 250–450)
UIBC: 117 ug/dL (ref 117–376)

## 2021-10-21 LAB — CMP (CANCER CENTER ONLY)
ALT: 9 U/L (ref 0–44)
AST: 12 U/L — ABNORMAL LOW (ref 15–41)
Albumin: 4 g/dL (ref 3.5–5.0)
Alkaline Phosphatase: 101 U/L (ref 38–126)
Anion gap: 4 — ABNORMAL LOW (ref 5–15)
BUN: 16 mg/dL (ref 8–23)
CO2: 31 mmol/L (ref 22–32)
Calcium: 8.6 mg/dL — ABNORMAL LOW (ref 8.9–10.3)
Chloride: 107 mmol/L (ref 98–111)
Creatinine: 1.22 mg/dL (ref 0.61–1.24)
GFR, Estimated: 58 mL/min — ABNORMAL LOW (ref >60)
Glucose, Bld: 97 mg/dL (ref 70–99)
Potassium: 4.3 mmol/L (ref 3.5–5.1)
Sodium: 142 mmol/L (ref 135–145)
Total Bilirubin: 1.4 mg/dL — ABNORMAL HIGH (ref 0.3–1.2)
Total Protein: 6.1 g/dL — ABNORMAL LOW (ref 6.5–8.1)

## 2021-10-21 LAB — FERRITIN: Ferritin: 76 ng/mL (ref 24–336)

## 2021-10-21 NOTE — Progress Notes (Signed)
[name]    HEMATOLOGY/ONCOLOGY CLINIC NOTE  Date of Service: 10/21/2021  Patient Care Team: Deland Pretty, MD as PCP - General (Internal Medicine)  CHIEF COMPLAINTS/PURPOSE OF CONSULTATION:  Evaluation and management of anemia.  HISTORY OF PRESENTING ILLNESS:   Please see previous note for details on initial presentation  INTERVAL HISTORY  Brandon Fischer is a 88 y.o. male who returns today for management and evaluation of his anemia.The patient's last visit with [place] was on 10/29/2018.   Patient was doing well at his last visit with me on 06/17/21.  Patient is here with his daughter during this visit. He does not have any new symptoms during today's visit.  He is regularly taking Iron supplements which he notes he is tolerating.  He denies any abnormal bleeding, any abdominal pain, and leg swelling.  MEDICAL HISTORY:  Past Medical History:  Diagnosis Date   Bilateral carotid artery stenosis without cerebral infarction    ASYMPTOMATIC--  BILATERAL ICA  50-69%  PER CARDIOLOGIST NOTE, DR Einar Gip   Bilateral carotid bruits    LEFT  soft bruit, right no no carotid bruit per 07-24-2014 dr Einar Gip note   CKD (chronic kidney disease)    dr foster Narda Amber kidney, ckd stage 3 per lov 02-16-2019   Complete heart block ([place]) 94/17/4081   Complication of anesthesia    1 episode atrial fib 02-09-2014 [facility] and followed up with cardiology, not further issue   Coronary artery disease    NON-OBSTRUCTIVE CAD  per cath 08-06-2004 HX PALPITATIONS-AND -TACHYCARDIA-   Encounter for care of pacemaker 04/26/2021   First degree heart block    Full dentures    GERD (gastroesophageal reflux disease)    Heart palpitations    Not any more   History of atrial fibrillation without current medication    EPISODE OF AFIB WITH RVR INTRAOPERATIVELY 02-09-2014 AT [facility]--  RESOLVED AND PT FOLLOWED UP WITH CARDIOLOGIST  DR Einar Gip   History of kidney stones    Hyperlipidemia    Hypertension    Memory loss     Nephrolithiasis    RIGHT   Organic impotence    Pacemaker: [employer] ASSURITY DR-RF - [id number] 04/26/2021   Psoriasis    Clearing up   PSVT (paroxysmal supraventricular tachycardia) ([place])    Right ureteral calculus    S/P radiofrequency ablation operation for arrhythmia-SVT 04/28/2014   Simple renal cyst    right   Wears glasses    Wears hearing aid    BILATERAL  History of malignant melanoma status post Mohs surgery to remove the skin lesion on the face with skin grafting. 3 years ago.  SURGICAL HISTORY: Past Surgical History:  Procedure Laterality Date   CARDIAC CATHETERIZATION  08-06-2004 dr Verlon Setting   (abnormal stress test) Non-obstructive CAD, pLAD 20-30%,  LCX 30-40%, pRCA 20-30%, dRCA 40%, distal LM  mild diffuse calcifation 20-30% tapering stenosis,  preserved LVSF ef 55-60%   CARPAL TUNNEL RELEASE Bilateral 2005   CATARACT EXTRACTION, BILATERAL Bilateral 2011   CHOLECYSTECTOMY     CHOLECYSTECTOMY OPEN  1982   and NEPHROLITHOTOMY   COLONOSCOPY W/ POLYPECTOMY  last one 04-05-2008   CYSTOSCOPY WITH LITHOLAPAXY Right 04/14/2017   Procedure: CYSTOSCOPY WITH LITHOLAPAXY/RIGHT RETROGRADE PLYOGRAM/RIGHT URETEROSCOPY;  Surgeon: [name], MD;  Location: [facility];  Service: Urology;  Laterality: Right;   CYSTOSCOPY WITH RETROGRADE PYELOGRAM, URETEROSCOPY AND STENT PLACEMENT Right 03/14/2014   Procedure: CYSTO/RIGHT RETROGRADE PYELOGRAM/RIGHT URETEROSCOPY/STONE EXTRACTION/STENT PLACEMENT;  Surgeon: Malka So, MD;  Location:  [facility];  Service: Urology;  Laterality: Right;   CYSTOSCOPY WITH RETROGRADE PYELOGRAM, URETEROSCOPY AND STENT PLACEMENT Left 04/19/2019   Procedure: CYSTOSCOPY WITH LEFT  RETROGRADE LEFT , URETEROSCOPY WITH HOLMIUM LASER, BASKET EXTRACTION AND STENT PLACEMENT;  Surgeon: [name], MD;  Location: [facility];  Service: Urology;  Laterality: Left;   CYSTOSCOPY WITH RETROGRADE PYELOGRAM, URETEROSCOPY AND STENT  PLACEMENT Left 03/30/2020   Procedure: CYSTOSCOPY WITH RETROGRADE PYELOGRAM, URETEROSCOPY, BASKETTING OF STONE AND STENT PLACEMENT;  Surgeon: Alexis Frock, MD;  Location: [facility];  Service: Urology;  Laterality: Left;   EYE SURGERY Bilateral    Cataracts removed   HOLMIUM LASER APPLICATION Right 76/72/0947   Procedure: RIGHT HOLMIUM LASER APPLICATION;  Surgeon: Malka So, MD;  Location: [facility];  Service: Urology;  Laterality: Right;   KNEE ARTHROSCOPY W/ DEBRIDEMENT Right 11/23/2002   and Synovectomy   LUMBAR LAMINECTOMY/DECOMPRESSION MICRODISCECTOMY N/A 07/15/2018   Procedure: Posterior lumbar decompression and fusion L4-5;  Surgeon: Melina Schools, MD;  Location: [facility];  Service: Orthopedics;  Laterality: N/A;  4 hrs   NEPHROLITHOTOMY  01/16/2011   Procedure: NEPHROLITHOTOMY PERCUTANEOUS;  Surgeon: Malka So;  Location: [facility];  Service: Urology;  Laterality: Left;  C-Arm  Holmium Laser   NEPHROLITHOTOMY  1981   PACEMAKER IMPLANT N/A 04/26/2021   Procedure: PACEMAKER IMPLANT;  Surgeon: Constance Haw, MD;  Location: [facility];  Service: Cardiovascular;  Laterality: N/A;   SUPRAVENTRICULAR TACHYCARDIA ABLATION N/A 04/28/2014   Procedure: SUPRAVENTRICULAR TACHYCARDIA ABLATION;  Surgeon: Evans Lance, MD;  Location: [facility];  Service: Cardiovascular;  Laterality: N/A;    SOCIAL HISTORY: Social History   Socioeconomic History   Marital status: Widowed    Spouse name: Not on file   Number of children: 2   Years of education: Not on file   Highest education level: Some college, no degree  Occupational History   Occupation: postal service     Comment: retired  Tobacco Use   Smoking status: Former    Packs/day: 1.00    Years: 40.00    Total pack years: 40.00    Types: Cigarettes    Quit date: 02/06/1998    Years since quitting: 23.7   Smokeless tobacco: Never  Vaping Use   Vaping Use: Never used  Substance and  Sexual Activity   Alcohol use: No   Drug use: No   Sexual activity: Not on file  Other Topics Concern   Not on file  Social History Narrative   06/25/21 lives alone, 2 children are in and out all day, has Medical alert   Social Determinants of Health   Financial Resource Strain: Not on file  Food Insecurity: Not on file  Transportation Needs: Not on file  Physical Activity: Not on file  Stress: Not on file  Social Connections: Not on file  Intimate Partner Violence: Not on file    FAMILY HISTORY: Family History  Problem Relation Age of Onset   Cancer Mother        Ovarian or colon   Heart attack Father    Heart attack Brother     ALLERGIES:  is allergic to bupropion, olmesartan, and trazodone.  MEDICATIONS:  Current Outpatient Medications  Medication Sig Dispense Refill   Ascorbic Acid (VITAMIN C PO) Take 1 tablet by mouth daily.     aspirin (ASPIRIN CHILDRENS) 81 MG chewable tablet Chew 1 tablet (81 mg total) by mouth daily.  atorvastatin (LIPITOR) 20 MG tablet Take 10 mg by mouth daily.     Cholecalciferol (VITAMIN D-3) 25 MCG (1000 UT) CAPS Take 1,000 Units by mouth daily with breakfast.     Ferrous Sulfate (IRON) 28 MG TABS daily.     hydrALAZINE (APRESOLINE) 10 MG tablet Take 1 tablet (10 mg total) by mouth 3 (three) times daily. 90 tablet 0   losartan (COZAAR) 50 MG tablet Take 50 mg by mouth daily.     metoprolol succinate (TOPROL-XL) 25 MG 24 hr tablet TAKE 1 TABLET (25 MG TOTAL) BY MOUTH DAILY. 90 tablet 3   pantoprazole (PROTONIX) 40 MG tablet Take 40 mg by mouth in the morning and at bedtime.      tamsulosin (FLOMAX) 0.4 MG CAPS capsule Take 0.4 mg by mouth daily after supper.     traMADol (ULTRAM) 50 MG tablet Take 1 tablet (50 mg total) by mouth every 6 (six) hours as needed for moderate pain or severe pain. Post-operatively 15 tablet 0   traZODone (DESYREL) 50 MG tablet Take 50-100 mg by mouth daily as needed.     zinc gluconate 50 MG tablet Take 50 mg by  mouth daily.     No current facility-administered medications for this visit.    REVIEW OF SYSTEMS:   .10 Point review of Systems was done is negative except as noted above.  PHYSICAL EXAMINATION:  ECOG FS:1 - Symptomatic but completely ambulatory  Vitals:   10/21/21 1249  BP: (!) 161/60  Pulse: 74  Resp: 18  Temp: 97.7 F (36.5 C)  SpO2: 100%    Wt Readings from Last 3 Encounters:  10/21/21 173 lb 12.8 oz (78.8 kg)  09/20/21 172 lb 6.4 oz (78.2 kg)  07/26/21 179 lb (81.2 kg)   Body mass index is 24.94 kg/m.   NAD GENERAL:alert, in no acute distress and comfortable SKIN: no acute rashes, no significant lesions EYES: conjunctiva are pink and non-injected, sclera anicteric NECK: supple, no JVD LYMPH:  no palpable lymphadenopathy in the cervical, axillary or inguinal regions LUNGS: clear to auscultation b/l with normal respiratory effort HEART: regular rate & rhythm ABDOMEN:  normoactive bowel sounds , non tender, not distended. Just palpable splenomegaly. Extremity: no pedal edema PSYCH: alert & oriented x 3 with fluent speech NEURO: no focal motor/sensory deficits  Exam performed in chair.  LABORATORY DATA:  I have reviewed the data as listed  .    Latest Ref Rng & Units 10/21/2021   12:35 PM 06/17/2021    1:30 PM 04/25/2021    2:40 PM  CBC  WBC 4.0 - 10.5 K/uL 4.2  4.3  5.4   Hemoglobin 13.0 - 17.0 g/dL 9.9  9.8  9.1   Hematocrit 39.0 - 52.0 % 30.1  30.4    30.8  30.3   Platelets 150 - 400 K/uL 134  132  248     CBC    Component Value Date/Time   WBC 4.2 10/21/2021 1235   WBC 5.4 04/25/2021 1440   RBC 3.87 (L) 10/21/2021 1235   HGB 9.9 (L) 10/21/2021 1235   HGB 12.0 (L) 12/24/2016 1113   HCT 30.1 (L) 10/21/2021 1235   HCT 30.4 (L) 06/17/2021 1330   HCT 36.2 (L) 12/24/2016 1113   PLT 134 (L) 10/21/2021 1235   PLT 123 (L) 12/24/2016 1113   MCV 77.8 (L) 10/21/2021 1235   MCV 79.7 12/24/2016 1113   MCH 25.6 (L) 10/21/2021 1235   MCHC 32.9 10/21/2021  1235  RDW 16.0 (H) 10/21/2021 1235   RDW 16.2 (H) 12/24/2016 1113   LYMPHSABS 0.9 10/21/2021 1235   LYMPHSABS 0.7 (L) 12/24/2016 1113   MONOABS 0.3 10/21/2021 1235   MONOABS 0.4 12/24/2016 1113   EOSABS 0.1 10/21/2021 1235   EOSABS 0.0 12/24/2016 1113   BASOSABS 0.0 10/21/2021 1235   BASOSABS 0.0 12/24/2016 1113    .    Latest Ref Rng & Units 06/17/2021    1:30 PM 04/25/2021    2:40 PM 04/25/2021    1:43 PM  CMP  Glucose 70 - 99 mg/dL 117  108  112   BUN 8 - 23 mg/dL '23  14  13   '$ Creatinine 0.61 - 1.24 mg/dL 1.22  1.39  1.39   Sodium 135 - 145 mmol/L 143  143  143   Potassium 3.5 - 5.1 mmol/L 4.0  3.9  4.0   Chloride 98 - 111 mmol/L 108  111  110   CO2 22 - 32 mmol/L '29  24  27   '$ Calcium 8.9 - 10.3 mg/dL 9.2  8.4  8.2   Total Protein 6.5 - 8.1 g/dL 5.8  5.9  5.5   Total Bilirubin 0.3 - 1.2 mg/dL 1.2  1.5  1.5   Alkaline Phos 38 - 126 U/L 108  120  118   AST 15 - 41 U/L '11  16  13   '$ ALT 0 - 44 U/L '9  18  16    '$ . Lab Results  Component Value Date   LDH 159 06/17/2021    . Lab Results  Component Value Date   IRON 111 10/21/2021   TIBC 228 (L) 10/21/2021   IRONPCTSAT 49 (H) 10/21/2021   (Iron and TIBC)  Lab Results  Component Value Date   FERRITIN 76 10/21/2021    Component     Latest Ref Rng & Units 09/09/2016  IgG (Immunoglobin G), Serum     700 - 1,600 mg/dL 917  IgA/Immunoglobulin A, Serum     61 - 437 mg/dL 203  IgM, Qn, Serum     15 - 143 mg/dL 43  Total Protein     6.0 - 8.5 g/dL 6.1  Albumin SerPl Elph-Mcnc     2.9 - 4.4 g/dL 3.7  Alpha 1     0.0 - 0.4 g/dL 0.3  Alpha2 Glob SerPl Elph-Mcnc     0.4 - 1.0 g/dL 0.5  B-Globulin SerPl Elph-Mcnc     0.7 - 1.3 g/dL 0.8  Gamma Glob SerPl Elph-Mcnc     0.4 - 1.8 g/dL 0.7  M Protein SerPl Elph-Mcnc     Not Observed g/dL Not Observed  Globulin, Total     2.2 - 3.9 g/dL 2.4  Albumin/Glob SerPl     0.7 - 1.7 1.6  IFE 1      Comment  Please Note (HCV):      Comment  Iron     42 - 163 ug/dL 64   TIBC     202 - 409 ug/dL 194 (L)  UIBC     117 - 376 ug/dL 130  %SAT     20 - 55 % 33  Folate, Hemolysate     Not Estab. ng/mL 369.8  HCT     37.5 - 51.0 % 28.0 (L)  Folate, RBC     >498 ng/mL 1,321  Ig Kappa Free Light Chain     3.3 - 19.4 mg/L 21.4 (H)  Ig Lambda Free Light Chain  5.7 - 26.3 mg/L 36.6 (H)  Kappa/Lambda FluidC Ratio     0.26 - 1.65 0.58  LDH     125 - 245 U/L 326 (H)  Sed Rate     0 - 30 mm/hr 2  Vitamin B12     232 - 1245 pg/mL 1,261 (H)  TSH     0.320 - 4.118 m(IU)/L 2.609  Hep C Virus Ab     0.0 - 0.9 s/co ratio 0.1  Ferritin     22 - 316 ng/ml 399 (H)   Component     Latest Ref Rng & Units 09/17/2016 09/18/2016  Prothrombin Time     11.4 - 15.2 seconds  14.2  INR       1.11  LDH     125 - 245 U/L 309 (H)   Haptoglobin     34 - 200 mg/dL <10 (L)   Coombs', Direct     Negative Negative   APTT     24 - 36 seconds  32   Component     Latest Ref Rng & Units 09/30/2016  RA Latex Turbid.     0.0 - 13.9 IU/mL <10.0  CCP Antibodies IgG/IgA     0 - 19 units 12  ANA Ab, IFA      Negative  Sed Rate     0 - 30 mm/hr 2   Component     Latest Ref Rng & Units 09/30/2016 09/30/2016 09/30/2016 10/22/2016         3:23 PM  3:23 PM  3:23 PM   Interpretation       Comment    Comment:       Comment Comment   Specimen:       Peripheral Blood    Submitted Dx:       Comment    Viability:       97%    Cell Population       Comment    Granulocytes:       Comment    Monocytes:       Comment    Antibodies Performed:       Comment    Director Review       Comment    RA Latex Turbid.     0.0 - 13.9 IU/mL <10.0     CCP Antibodies IgG/IgA     0 - 19 units 12     ANA Ab, IFA      Negative     LDH     125 - 245 U/L    185           RADIOGRAPHIC STUDIES: I have personally reviewed the radiological images as listed and agreed with the findings in the report. No results found.  ASSESSMENT & PLAN:   #1 h/o Pancytopenia with normocytic  Anemia, neutropenia and thrombocytopenia  This could certainly be from the patient's splenomegaly causing hypersplenism. Etiology of the splenomegaly is unclear.  09/18/16 Bone marrow showed no overt evidence of MDS/Myelofibrosis no evidence of acute leukemia Increased CD8+ T cell population ? Reactive vs clonal. Normal cytogenetics. Sed rate WNL neg ANA and Rheumatoid panel  No evidence of monoclonal paraproteinemia. TSH levels within normal limits B12, folate and iron levels within normal limits. Hepatitis C negative  Given improvement in counts - the T cell population likely reactive and could be related to a viral infection that appears to be resolving now.  05/07/17 US Abdomen revealed decreased spleen size  to about 900 cubic cm, and explains mild thrombocytopenia with PLT at 128k   10/02/17 US Abdomen revealed Status post cholecystectomy. Bilateral simple renal cysts are noted. Mild splenomegaly is noted with calculated volume of 630 cubic cm, which is decreased compared to prior exam.  #2  Currently Anemia - borderline microcytic with normal PLTs and WBC count  PLAN: -Labs done today were reviewed with the patient. Labs showed CBC and CMP were stable.   --Recommended to continue taking B complex supplement. Continue PO iron to target ferritin of >100. -no indication for IV Iron or EPO at this time. -RTC with Dr Irene Limbo with labs in 6 months.   FOLLOW UP: RTC with Dr Irene Limbo with labs in 6 months    The total time spent in the appointment was 20 minutes* .  All of the patient's questions were answered with apparent satisfaction. The patient knows to call the clinic with any problems, questions or concerns.  I, Eugene Gavia, am acting as scribe for Dr. Sullivan Lone, MD  .I have reviewed the above documentation for accuracy and completeness, and I agree with the above. Sullivan Lone MD [place] AAHIVMS [facility] Hematology/Oncology Physician [facility]  .*Total  Encounter Time as defined by the Centers for Medicare and Medicaid Services includes, in addition to the face-to-face time of a patient visit (documented in the note above) non-face-to-face time: obtaining and reviewing outside history, ordering and reviewing medications, tests or procedures, care coordination (communications with other health care professionals or caregivers) and documentation in the medical record.

## 2021-10-22 DIAGNOSIS — R2689 Other abnormalities of gait and mobility: Secondary | ICD-10-CM | POA: Diagnosis not present

## 2021-10-22 DIAGNOSIS — M25511 Pain in right shoulder: Secondary | ICD-10-CM | POA: Diagnosis not present

## 2021-10-24 DIAGNOSIS — R2689 Other abnormalities of gait and mobility: Secondary | ICD-10-CM | POA: Diagnosis not present

## 2021-10-24 DIAGNOSIS — M25511 Pain in right shoulder: Secondary | ICD-10-CM | POA: Diagnosis not present

## 2021-10-28 DIAGNOSIS — Z45018 Encounter for adjustment and management of other part of cardiac pacemaker: Secondary | ICD-10-CM | POA: Diagnosis not present

## 2021-10-28 DIAGNOSIS — I442 Atrioventricular block, complete: Secondary | ICD-10-CM | POA: Diagnosis not present

## 2021-10-29 DIAGNOSIS — R2689 Other abnormalities of gait and mobility: Secondary | ICD-10-CM | POA: Diagnosis not present

## 2021-10-29 DIAGNOSIS — M25511 Pain in right shoulder: Secondary | ICD-10-CM | POA: Diagnosis not present

## 2021-10-31 DIAGNOSIS — R2689 Other abnormalities of gait and mobility: Secondary | ICD-10-CM | POA: Diagnosis not present

## 2021-10-31 DIAGNOSIS — M25511 Pain in right shoulder: Secondary | ICD-10-CM | POA: Diagnosis not present

## 2021-11-07 DIAGNOSIS — R2689 Other abnormalities of gait and mobility: Secondary | ICD-10-CM | POA: Diagnosis not present

## 2021-11-07 DIAGNOSIS — M25511 Pain in right shoulder: Secondary | ICD-10-CM | POA: Diagnosis not present

## 2021-11-12 DIAGNOSIS — R2689 Other abnormalities of gait and mobility: Secondary | ICD-10-CM | POA: Diagnosis not present

## 2021-11-12 DIAGNOSIS — M25511 Pain in right shoulder: Secondary | ICD-10-CM | POA: Diagnosis not present

## 2021-11-14 DIAGNOSIS — L989 Disorder of the skin and subcutaneous tissue, unspecified: Secondary | ICD-10-CM | POA: Diagnosis not present

## 2021-11-14 DIAGNOSIS — L905 Scar conditions and fibrosis of skin: Secondary | ICD-10-CM | POA: Diagnosis not present

## 2021-11-14 DIAGNOSIS — C434 Malignant melanoma of scalp and neck: Secondary | ICD-10-CM | POA: Diagnosis not present

## 2021-11-26 DIAGNOSIS — R2689 Other abnormalities of gait and mobility: Secondary | ICD-10-CM | POA: Diagnosis not present

## 2021-11-26 DIAGNOSIS — M25511 Pain in right shoulder: Secondary | ICD-10-CM | POA: Diagnosis not present

## 2021-11-28 DIAGNOSIS — R2689 Other abnormalities of gait and mobility: Secondary | ICD-10-CM | POA: Diagnosis not present

## 2021-11-28 DIAGNOSIS — M25511 Pain in right shoulder: Secondary | ICD-10-CM | POA: Diagnosis not present

## 2021-12-03 DIAGNOSIS — R2689 Other abnormalities of gait and mobility: Secondary | ICD-10-CM | POA: Diagnosis not present

## 2021-12-03 DIAGNOSIS — M25511 Pain in right shoulder: Secondary | ICD-10-CM | POA: Diagnosis not present

## 2021-12-08 ENCOUNTER — Emergency Department (HOSPITAL_BASED_OUTPATIENT_CLINIC_OR_DEPARTMENT_OTHER)
Admission: EM | Admit: 2021-12-08 | Discharge: 2021-12-08 | Disposition: A | Discharge status: Home / Self Care | Payer: Medicare Other | Attending: Emergency Medicine | Admitting: Emergency Medicine

## 2021-12-08 ENCOUNTER — Encounter (HOSPITAL_BASED_OUTPATIENT_CLINIC_OR_DEPARTMENT_OTHER): Payer: Self-pay | Admitting: Emergency Medicine

## 2021-12-08 ENCOUNTER — Other Ambulatory Visit: Payer: Self-pay

## 2021-12-08 DIAGNOSIS — I4891 Unspecified atrial fibrillation: Secondary | ICD-10-CM | POA: Insufficient documentation

## 2021-12-08 DIAGNOSIS — Z79899 Other long term (current) drug therapy: Secondary | ICD-10-CM | POA: Diagnosis not present

## 2021-12-08 DIAGNOSIS — Z7982 Long term (current) use of aspirin: Secondary | ICD-10-CM | POA: Diagnosis not present

## 2021-12-08 DIAGNOSIS — I10 Essential (primary) hypertension: Secondary | ICD-10-CM | POA: Insufficient documentation

## 2021-12-08 DIAGNOSIS — S0990XA Unspecified injury of head, initial encounter: Secondary | ICD-10-CM | POA: Diagnosis present

## 2021-12-08 DIAGNOSIS — S0101XA Laceration without foreign body of scalp, initial encounter: Secondary | ICD-10-CM | POA: Diagnosis not present

## 2021-12-08 DIAGNOSIS — W19XXXA Unspecified fall, initial encounter: Secondary | ICD-10-CM | POA: Insufficient documentation

## 2021-12-08 MED ORDER — LIDOCAINE-EPINEPHRINE-TETRACAINE (LET) TOPICAL GEL
3.0000 mL | Freq: Once | TOPICAL | Status: AC
  Administered 2021-12-08: 3 mL via TOPICAL
  Filled 2021-12-08: qty 3

## 2021-12-08 NOTE — ED Triage Notes (Signed)
Brandon Fischer getting oob this am, hit the corner of his night stand hitting right posterior side of head.no LOC.

## 2021-12-08 NOTE — ED Provider Notes (Signed)
[place] EMERGENCY DEPT Provider Note   CSN: [id number] Arrival date & time: 12/08/21  4098     History  Chief Complaint  Patient presents with   Brandon Fischer is a 88 y.o. male.   Fall     88 year old male with medical history significant for HTN, nephrolithiasis, GERD SVT, lateral carotid artery stenosis, HLD, history of atrial fibrillation not currently on anticoagulation status post radiofrequency ablation, CAD who presents emergency department after a fall out of bed.  The patient states he tripped while trying to get out of bed and slid to the ground.  He states that he struck the back of his head mildly on a dresser, sustaining a small laceration to the right occiput.  He denies any loss of consciousness.  He is not on anticoagulation.  He denies any other injuries or complaints.  He was immediately ambulatory after the accident.  He denies any headache or neck pain.  He denies any neurologic deficits.  He arrived to the emergency department GCS 15, ABC intact.  Home Medications Prior to Admission medications   Medication Sig Start Date End Date Taking? Authorizing Provider  Ascorbic Acid (VITAMIN C PO) Take 1 tablet by mouth daily.    [provider]  aspirin (ASPIRIN CHILDRENS) 81 MG chewable tablet Chew 1 tablet (81 mg total) by mouth daily. 07/20/20   Adrian Prows, MD  atorvastatin (LIPITOR) 20 MG tablet Take 10 mg by mouth daily.    [provider]  Cholecalciferol (VITAMIN D-3) 25 MCG (1000 UT) CAPS Take 1,000 Units by mouth daily with breakfast.    [provider]  Ferrous Sulfate (IRON) 28 MG TABS daily.    [provider]  hydrALAZINE (APRESOLINE) 10 MG tablet Take 1 tablet (10 mg total) by mouth 3 (three) times daily. 04/07/21   Blue, Soijett A, PA-C  losartan (COZAAR) 50 MG tablet Take 50 mg by mouth daily. 04/23/20   [provider]  metoprolol succinate (TOPROL-XL) 25 MG 24 hr tablet TAKE 1  TABLET (25 MG TOTAL) BY MOUTH DAILY. 07/15/21 10/13/21  Adrian Prows, MD  pantoprazole (PROTONIX) 40 MG tablet Take 40 mg by mouth in the morning and at bedtime.     [provider]  tamsulosin (FLOMAX) 0.4 MG CAPS capsule Take 0.4 mg by mouth daily after supper.    [provider]  traMADol (ULTRAM) 50 MG tablet Take 1 tablet (50 mg total) by mouth every 6 (six) hours as needed for moderate pain or severe pain. Post-operatively 03/30/20   Alexis Frock, MD  traZODone (DESYREL) 50 MG tablet Take 50-100 mg by mouth daily as needed. 07/13/21   [provider]  zinc gluconate 50 MG tablet Take 50 mg by mouth daily.    [provider]      Allergies    Bupropion, Olmesartan, and Trazodone    Review of Systems   Review of Systems  All other systems reviewed and are negative.   Physical Exam Updated Vital Signs Pulse 60   Temp 98.4 F (36.9 C) (Oral)   Resp 16   SpO2 100%  Physical Exam Vitals and nursing note reviewed.  Constitutional:      Appearance: He is well-developed.     Comments: GCS 15, ABC intact  HENT:     Head: Normocephalic.     Comments: 1.5 inch laceration to the right posterior occiput, hemostatic with pressure and gauze Eyes:     Conjunctiva/sclera: Conjunctivae  normal.  Neck:     Comments: No midline tenderness to palpation of the cervical spine. ROM intact. Cardiovascular:     Rate and Rhythm: Normal rate and regular rhythm.  Pulmonary:     Effort: Pulmonary effort is normal. No respiratory distress.     Breath sounds: Normal breath sounds.  Chest:     Comments: Chest wall stable and non-tender to AP and lateral compression. Clavicles stable and non-tender to AP compression Abdominal:     Palpations: Abdomen is soft.     Tenderness: There is no abdominal tenderness.     Comments: Pelvis stable to lateral compression.  Musculoskeletal:     Cervical back: Neck supple.     Comments: No midline tenderness to palpation of the  thoracic or lumbar spine. Extremities atraumatic with intact ROM.   Skin:    General: Skin is warm and dry.  Neurological:     Mental Status: He is alert.     Comments: CN II-XII grossly intact. Moving all four extremities spontaneously and sensation grossly intact 5/5 strength throughout.     ED Results / Procedures / Treatments   Labs (all labs ordered are listed, but only abnormal results are displayed) Labs Reviewed - No data to display  EKG None  Radiology No results found.  Procedures .[name] KitchenLaceration Repair  Date/Time: 12/08/2021 7:51 AM  Performed by: Regan Lemming, MD Authorized by: Regan Lemming, MD   Consent:    Consent obtained:  Verbal   Consent given by:  Patient   Risks discussed:  Infection, pain and poor wound healing Universal protocol:    Patient identity confirmed:  Verbally with patient Laceration details:    Length (cm):  3   Depth (mm):  4 Treatment:    Area cleansed with:  Soap and water   Amount of cleaning:  Standard Skin repair:    Repair method:  Staples   Number of staples:  4 Approximation:    Approximation:  Close Repair type:    Repair type:  Simple Post-procedure details:    Dressing:  Open (no dressing)   Procedure completion:  Tolerated     Medications Ordered in ED Medications  lidocaine-EPINEPHrine-tetracaine (LET) topical gel (3 mLs Topical Given 12/08/21 0758)    ED Course/ Medical Decision Making/ A&P                           Medical Decision Making   88 year old male with medical history significant for HTN, nephrolithiasis, GERD SVT, lateral carotid artery stenosis, HLD, history of atrial fibrillation not currently on anticoagulation status post radiofrequency ablation, CAD who presents emergency department after a fall out of bed.  The patient states he tripped while trying to get out of bed and slid to the ground.  He states that he struck the back of his head mildly on a dresser, sustaining a small laceration to  the right occiput.  He denies any loss of consciousness.  He is not on anticoagulation.  He denies any other injuries or complaints.  He was immediately ambulatory after the accident.  He denies any headache or neck pain.  He denies any neurologic deficits.  His tetanus is up-to-date.  He arrived to the emergency department GCS 15, ABC intact.  On arrival, the patient was vitally stable.  Sinus rhythm noted on cardiac telemetry.  The patient states that his tetanus is up-to-date with last tetanus shot within the last 2 years.  Physical exam  concerning for a 1.5 cm laceration to the right posterior occiput, no midline tenderness of the cervical spine, no other evidence of traumatic injury.  Given the patient's age, discussed CT imaging of the head versus observation and family and the patient would prefer to after shared decision making to forego CT imaging of the head at this time. Family was advised to watch for a change in mental/neurologic status which would prompt a return visit/need for CT imaging.    We will proceed with laceration repair.  Let gel was applied and the wound was subsequently cleaned by nursing staff bedside.  The wound was repaired with staples as per the procedure note above.  On repeat assessment, the patient was well-appearing, at his baseline mental status, no other evidence of traumatic injury on primary or secondary survey.  Stable for discharge, neurologically intact on repeat assessment, at his baseline mental status per family. Wound care instructions provided, plan for staple removal in 10 days time.   Final Clinical Impression(s) / ED Diagnoses Final diagnoses:  Fall, initial encounter  Laceration of scalp without foreign body, initial encounter    Rx / DC Orders ED Discharge Orders     None         Regan Lemming, MD 12/08/21 [phone]

## 2021-12-08 NOTE — ED Notes (Signed)
Head laceration cleaned with sterile water. 2 in laceration noted to right posterior head with red wound bed.

## 2021-12-08 NOTE — Discharge Instructions (Addendum)
We discussed CT imaging of the head after fall and minor head trauma and this was declined via shared decision making after discussing the risks and benefits. Watch for signs of developing infection, monitor his mental status, return for CT imaging if he develops severe headache, change in mental status, neurologic deficits such as facial droop, numbness, weakness, difficulty swallowing or speaking.

## 2021-12-08 NOTE — ED Notes (Signed)
Wound care performed on stapled lac. At right upper occiput.

## 2021-12-09 DIAGNOSIS — R946 Abnormal results of thyroid function studies: Secondary | ICD-10-CM | POA: Diagnosis not present

## 2021-12-17 DIAGNOSIS — S0191XA Laceration without foreign body of unspecified part of head, initial encounter: Secondary | ICD-10-CM | POA: Diagnosis not present

## 2021-12-17 DIAGNOSIS — Z4802 Encounter for removal of sutures: Secondary | ICD-10-CM | POA: Diagnosis not present

## 2022-01-16 ENCOUNTER — Emergency Department (HOSPITAL_COMMUNITY)
Admission: EM | Admit: 2022-01-16 | Discharge: 2022-01-16 | Disposition: A | Discharge status: Home / Self Care | Payer: Medicare Other | Attending: Emergency Medicine | Admitting: Emergency Medicine

## 2022-01-16 ENCOUNTER — Emergency Department (HOSPITAL_COMMUNITY): Payer: Medicare Other

## 2022-01-16 ENCOUNTER — Other Ambulatory Visit: Payer: Self-pay

## 2022-01-16 ENCOUNTER — Encounter (HOSPITAL_COMMUNITY): Payer: Self-pay | Admitting: Emergency Medicine

## 2022-01-16 DIAGNOSIS — S0121XA Laceration without foreign body of nose, initial encounter: Secondary | ICD-10-CM | POA: Diagnosis not present

## 2022-01-16 DIAGNOSIS — Z950 Presence of cardiac pacemaker: Secondary | ICD-10-CM | POA: Diagnosis not present

## 2022-01-16 DIAGNOSIS — W19XXXA Unspecified fall, initial encounter: Secondary | ICD-10-CM | POA: Diagnosis not present

## 2022-01-16 DIAGNOSIS — I129 Hypertensive chronic kidney disease with stage 1 through stage 4 chronic kidney disease, or unspecified chronic kidney disease: Secondary | ICD-10-CM | POA: Insufficient documentation

## 2022-01-16 DIAGNOSIS — M2578 Osteophyte, vertebrae: Secondary | ICD-10-CM | POA: Diagnosis not present

## 2022-01-16 DIAGNOSIS — W01198A Fall on same level from slipping, tripping and stumbling with subsequent striking against other object, initial encounter: Secondary | ICD-10-CM | POA: Diagnosis not present

## 2022-01-16 DIAGNOSIS — Y9301 Activity, walking, marching and hiking: Secondary | ICD-10-CM | POA: Diagnosis not present

## 2022-01-16 DIAGNOSIS — S199XXA Unspecified injury of neck, initial encounter: Secondary | ICD-10-CM | POA: Diagnosis not present

## 2022-01-16 DIAGNOSIS — M4312 Spondylolisthesis, cervical region: Secondary | ICD-10-CM | POA: Diagnosis not present

## 2022-01-16 DIAGNOSIS — Z7982 Long term (current) use of aspirin: Secondary | ICD-10-CM | POA: Insufficient documentation

## 2022-01-16 DIAGNOSIS — Z87891 Personal history of nicotine dependence: Secondary | ICD-10-CM | POA: Insufficient documentation

## 2022-01-16 DIAGNOSIS — R531 Weakness: Secondary | ICD-10-CM | POA: Diagnosis not present

## 2022-01-16 DIAGNOSIS — Y92002 Bathroom of unspecified non-institutional (private) residence single-family (private) house as the place of occurrence of the external cause: Secondary | ICD-10-CM | POA: Diagnosis not present

## 2022-01-16 DIAGNOSIS — G319 Degenerative disease of nervous system, unspecified: Secondary | ICD-10-CM | POA: Diagnosis not present

## 2022-01-16 DIAGNOSIS — I10 Essential (primary) hypertension: Secondary | ICD-10-CM | POA: Diagnosis not present

## 2022-01-16 DIAGNOSIS — F0390 Unspecified dementia without behavioral disturbance: Secondary | ICD-10-CM | POA: Diagnosis not present

## 2022-01-16 DIAGNOSIS — Z79899 Other long term (current) drug therapy: Secondary | ICD-10-CM | POA: Diagnosis not present

## 2022-01-16 DIAGNOSIS — I739 Peripheral vascular disease, unspecified: Secondary | ICD-10-CM | POA: Diagnosis not present

## 2022-01-16 DIAGNOSIS — N183 Chronic kidney disease, stage 3 unspecified: Secondary | ICD-10-CM | POA: Insufficient documentation

## 2022-01-16 DIAGNOSIS — S01111A Laceration without foreign body of right eyelid and periocular area, initial encounter: Secondary | ICD-10-CM | POA: Insufficient documentation

## 2022-01-16 DIAGNOSIS — S01411A Laceration without foreign body of right cheek and temporomandibular area, initial encounter: Secondary | ICD-10-CM | POA: Diagnosis not present

## 2022-01-16 DIAGNOSIS — S0181XA Laceration without foreign body of other part of head, initial encounter: Secondary | ICD-10-CM | POA: Diagnosis not present

## 2022-01-16 DIAGNOSIS — S0990XA Unspecified injury of head, initial encounter: Secondary | ICD-10-CM | POA: Diagnosis not present

## 2022-01-16 DIAGNOSIS — M419 Scoliosis, unspecified: Secondary | ICD-10-CM | POA: Diagnosis not present

## 2022-01-16 LAB — CBC WITH DIFFERENTIAL/PLATELET
Abs Immature Granulocytes: 0.02 10*3/uL (ref 0.00–0.07)
Basophils Absolute: 0 10*3/uL (ref 0.0–0.1)
Basophils Relative: 0 %
Eosinophils Absolute: 0.1 10*3/uL (ref 0.0–0.5)
Eosinophils Relative: 2 %
HCT: 29.1 % — ABNORMAL LOW (ref 39.0–52.0)
Hemoglobin: 9.4 g/dL — ABNORMAL LOW (ref 13.0–17.0)
Immature Granulocytes: 1 %
Lymphocytes Relative: 15 %
Lymphs Abs: 0.6 10*3/uL — ABNORMAL LOW (ref 0.7–4.0)
MCH: 25.5 pg — ABNORMAL LOW (ref 26.0–34.0)
MCHC: 32.3 g/dL (ref 30.0–36.0)
MCV: 78.9 fL — ABNORMAL LOW (ref 80.0–100.0)
Monocytes Absolute: 0.3 10*3/uL (ref 0.1–1.0)
Monocytes Relative: 7 %
Neutro Abs: 3 10*3/uL (ref 1.7–7.7)
Neutrophils Relative %: 75 %
Platelets: 122 10*3/uL — ABNORMAL LOW (ref 150–400)
RBC: 3.69 MIL/uL — ABNORMAL LOW (ref 4.22–5.81)
RDW: 16.1 % — ABNORMAL HIGH (ref 11.5–15.5)
WBC: 3.9 10*3/uL — ABNORMAL LOW (ref 4.0–10.5)
nRBC: 0 % (ref 0.0–0.2)

## 2022-01-16 LAB — BASIC METABOLIC PANEL
Anion gap: 6 (ref 5–15)
BUN: 16 mg/dL (ref 8–23)
CO2: 27 mmol/L (ref 22–32)
Calcium: 8.5 mg/dL — ABNORMAL LOW (ref 8.9–10.3)
Chloride: 107 mmol/L (ref 98–111)
Creatinine, Ser: 1.33 mg/dL — ABNORMAL HIGH (ref 0.61–1.24)
GFR, Estimated: 52 mL/min — ABNORMAL LOW (ref >60)
Glucose, Bld: 107 mg/dL — ABNORMAL HIGH (ref 70–99)
Potassium: 3.5 mmol/L (ref 3.5–5.1)
Sodium: 140 mmol/L (ref 135–145)

## 2022-01-16 MED ORDER — SODIUM CHLORIDE 0.9 % IV BOLUS
1000.0000 mL | Freq: Once | INTRAVENOUS | Status: AC
  Administered 2022-01-16: 1000 mL via INTRAVENOUS

## 2022-01-16 MED ORDER — LIDOCAINE HCL (PF) 1 % IJ SOLN
10.0000 mL | Freq: Once | INTRAMUSCULAR | Status: AC
  Administered 2022-01-16: 10 mL via INTRADERMAL
  Filled 2022-01-16: qty 10

## 2022-01-16 MED ORDER — ACETAMINOPHEN 500 MG PO TABS
1000.0000 mg | ORAL_TABLET | Freq: Once | ORAL | Status: AC
  Administered 2022-01-16: 1000 mg via ORAL
  Filled 2022-01-16: qty 2

## 2022-01-16 MED ORDER — TETANUS-DIPHTH-ACELL PERTUSSIS 5-2.5-18.5 LF-MCG/0.5 IM SUSY
0.5000 mL | PREFILLED_SYRINGE | Freq: Once | INTRAMUSCULAR | Status: DC
  Filled 2022-01-16: qty 0.5

## 2022-01-16 NOTE — ED Triage Notes (Signed)
Pt BIB [facility] with reports of standing from bed and falling. Pt reports hitting the right side of his head on a nightstand. PT has lac to the right brow with bleeding controlled. Denies blood thinner use.

## 2022-01-16 NOTE — ED Provider Notes (Signed)
[facility] EMERGENCY DEPARTMENT Provider Note  CSN: [id number] Arrival date & time: 01/16/22 1059  Chief Complaint(s) Fall  HPI Brandon Fischer is a 89 y.o. male with history of dementia, CKD, complete heart block status post pacemaker, hypertension, hyperlipidemia presenting after fall.  Patient reports that he was walking to the bathroom when he lost his balance and fell forward.  He denies losing consciousness.  He denies any headache, chest pain, neck pain, abdominal pain, nausea, vomiting, diarrhea, pain in his arms or pain in his legs.  The pressed life alert button and EMS came and brought him to the hospital.  His daughter is with him and reports he lives alone.  He has fallen before at home.  He uses a walker.   Past Medical History Past Medical History:  Diagnosis Date   Bilateral carotid artery stenosis without cerebral infarction    ASYMPTOMATIC--  BILATERAL ICA  50-69%  PER CARDIOLOGIST NOTE, DR Einar Gip   Bilateral carotid bruits    LEFT  soft bruit, right no no carotid bruit per 07-24-2014 dr Einar Gip note   CKD (chronic kidney disease)    dr foster Narda [place] kidney, ckd stage 3 per lov 02-16-2019   Complete heart block ([place]) 47/09/6281   Complication of anesthesia    1 episode atrial fib 02-09-2014 [facility] and followed up with cardiology, not further issue   Coronary artery disease    NON-OBSTRUCTIVE CAD  per cath 08-06-2004 HX PALPITATIONS-AND -TACHYCARDIA-   Encounter for care of pacemaker 04/26/2021   First degree heart block    Full dentures    GERD (gastroesophageal reflux disease)    Heart palpitations    Not any more   History of atrial fibrillation without current medication    EPISODE OF AFIB WITH RVR INTRAOPERATIVELY 02-09-2014 AT [facility]--  RESOLVED AND PT FOLLOWED UP WITH CARDIOLOGIST  DR Einar Gip   History of kidney stones    Hyperlipidemia    Hypertension    Memory loss    Nephrolithiasis    RIGHT   Organic impotence    Pacemaker: [employer] ASSURITY DR-RF - [id number] 04/26/2021   Psoriasis    Clearing up   PSVT (paroxysmal supraventricular tachycardia)    Right ureteral calculus    S/P radiofrequency ablation operation for arrhythmia-SVT 04/28/2014   Simple renal cyst    right   Wears glasses    Wears hearing aid    BILATERAL   Patient Active Problem List   Diagnosis Date Noted   Pacemaker: [employer]  PACEMAKER ASSURITY DR-RF - [id number] 04/26/2021   Encounter for care of pacemaker 04/26/2021   Complete heart block ([place]) 04/25/2021   Spinal stenosis at L4-L5 level 07/15/2018   S/P radiofrequency ablation operation for arrhythmia-SVT 04/28/2014   SVT (supraventricular tachycardia) 03/15/2014   Nephrolithiasis 01/17/2011   Hypertension    Dyslipidemia    Palpitation    Home Medication(s) Prior to Admission medications   Medication Sig Start Date End Date Taking? Authorizing Provider  Ascorbic Acid (VITAMIN C PO) Take 1 tablet by mouth daily.    [provider]  aspirin (ASPIRIN CHILDRENS) 81 MG chewable tablet Chew 1 tablet (81 mg total) by mouth daily. 07/20/20   Adrian Prows, MD  atorvastatin (LIPITOR) 20 MG tablet Take 10 mg by mouth daily.    [provider]  Cholecalciferol (VITAMIN D-3) 25 MCG (1000 UT) CAPS Take 1,000 Units by mouth daily with breakfast.    [provider]  Ferrous Sulfate (IRON) 28 MG TABS daily.    [provider]  hydrALAZINE (APRESOLINE) 10 MG tablet Take 1 tablet (10 mg total) by mouth 3 (three) times daily. 04/07/21   Blue, Soijett A, PA-C  losartan (COZAAR) 50 MG tablet Take 50 mg by mouth daily. 04/23/20   [provider]  metoprolol succinate (TOPROL-XL) 25 MG 24 hr tablet TAKE 1 TABLET (25 MG TOTAL) BY MOUTH DAILY. 07/15/21 10/13/21  Adrian Prows, MD  pantoprazole (PROTONIX) 40 MG tablet Take 40 mg by mouth in the morning and at bedtime.     [provider]  tamsulosin (FLOMAX) 0.4 MG CAPS capsule Take 0.4 mg by mouth  daily after supper.    [provider]  traMADol (ULTRAM) 50 MG tablet Take 1 tablet (50 mg total) by mouth every 6 (six) hours as needed for moderate pain or severe pain. Post-operatively 03/30/20   Alexis Frock, MD  traZODone (DESYREL) 50 MG tablet Take 50-100 mg by mouth daily as needed. 07/13/21   [provider]  zinc gluconate 50 MG tablet Take 50 mg by mouth daily.    [provider]                                                                                                                                    Past Surgical History Past Surgical History:  Procedure Laterality Date   CARDIAC CATHETERIZATION  08-06-2004 dr Verlon Setting   (abnormal stress test) Non-obstructive CAD, pLAD 20-30%,  LCX 30-40%, pRCA 20-30%, dRCA 40%, distal LM  mild diffuse calcifation 20-30% tapering stenosis,  preserved LVSF ef 55-60%   CARPAL TUNNEL RELEASE Bilateral 2005   CATARACT EXTRACTION, BILATERAL Bilateral 2011   CHOLECYSTECTOMY     CHOLECYSTECTOMY OPEN  1982   and NEPHROLITHOTOMY   COLONOSCOPY W/ POLYPECTOMY  last one 04-05-2008   CYSTOSCOPY WITH LITHOLAPAXY Right 04/14/2017   Procedure: CYSTOSCOPY WITH LITHOLAPAXY/RIGHT RETROGRADE PLYOGRAM/RIGHT URETEROSCOPY;  Surgeon: [name], MD;  Location: [facility];  Service: Urology;  Laterality: Right;   CYSTOSCOPY WITH RETROGRADE PYELOGRAM, URETEROSCOPY AND STENT PLACEMENT Right 03/14/2014   Procedure: CYSTO/RIGHT RETROGRADE PYELOGRAM/RIGHT URETEROSCOPY/STONE EXTRACTION/STENT PLACEMENT;  Surgeon: Malka So, MD;  Location[facility];  Service: Urology;  Laterality: Right;   CYSTOSCOPY WITH RETROGRADE PYELOGRAM, URETEROSCOPY AND STENT PLACEMENT Left 04/19/2019   Procedure: CYSTOSCOPY WITH LEFT  RETROGRADE LEFT , URETEROSCOPY WITH HOLMIUM LASER, BASKET EXTRACTION AND STENT PLACEMENT;  Surgeon: [name], MD;  Location: [facility];  Service: Urology;  Laterality: Left;   CYSTOSCOPY WITH RETROGRADE  PYELOGRAM, URETEROSCOPY AND STENT PLACEMENT Left 03/30/2020   Procedure: CYSTOSCOPY WITH RETROGRADE PYELOGRAM, URETEROSCOPY, BASKETTING OF STONE AND STENT PLACEMENT;  Surgeon: Alexis Frock, MD;  Location: [facility];  Service: Urology;  Laterality: Left;   EYE SURGERY Bilateral    Cataracts removed   HOLMIUM LASER APPLICATION Right 71/06/2692   Procedure: RIGHT HOLMIUM LASER APPLICATION;  Surgeon: Malka So, MD;  Location: [facility];  Service: Urology;  Laterality: Right;   KNEE ARTHROSCOPY W/ DEBRIDEMENT Right 11/23/2002   and Synovectomy   LUMBAR LAMINECTOMY/DECOMPRESSION MICRODISCECTOMY N/A 07/15/2018   Procedure: Posterior lumbar decompression and fusion L4-5;  Surgeon: Melina Schools, MD;  Location: [facility];  Service: Orthopedics;  Laterality: N/A;  4 hrs   NEPHROLITHOTOMY  01/16/2011   Procedure: NEPHROLITHOTOMY PERCUTANEOUS;  Surgeon: Malka So;  Location: [facility];  Service: Urology;  Laterality: Left;  C-Arm  Holmium Laser   NEPHROLITHOTOMY  1981   PACEMAKER IMPLANT N/A 04/26/2021   Procedure: PACEMAKER IMPLANT;  Surgeon: Constance Haw, MD;  Location: [facility];  Service: Cardiovascular;  Laterality: N/A;   SUPRAVENTRICULAR TACHYCARDIA ABLATION N/A 04/28/2014   Procedure: SUPRAVENTRICULAR TACHYCARDIA ABLATION;  Surgeon: Evans Lance, MD;  Location: [facility] CATH LAB;  Service: Cardiovascular;  Laterality: N/A;   Family History Family History  Problem Relation Age of Onset   Cancer Mother        Ovarian or colon   Heart attack Father    Heart attack Brother     Social History Social History   Tobacco Use   Smoking status: Former    Packs/day: 1.00    Years: 40.00    Total pack years: 40.00    Types: Cigarettes    Quit date: 02/06/1998    Years since quitting: 23.9   Smokeless tobacco: Never  Vaping Use   Vaping Use: Never used  Substance Use Topics   Alcohol use: No   Drug use: No   Allergies Bupropion, Olmesartan,  and Trazodone  Review of Systems Review of Systems  All other systems reviewed and are negative.   Physical Exam Vital Signs  I have reviewed the triage vital signs BP (!) 179/83   Pulse 71   Temp 97.9 F (36.6 C) (Oral)   Resp 20   SpO2 100%  Physical Exam Vitals and nursing note reviewed.  Constitutional:      General: He is not in acute distress.    Appearance: Normal appearance.  HENT:     Head:     Comments: Small right temporal laceration, approximately 2 cm. 1cm laceration to bridge of nose without underlying deformity.     Mouth/Throat:     Mouth: Mucous membranes are moist.  Eyes:     Conjunctiva/sclera: Conjunctivae normal.  Cardiovascular:     Rate and Rhythm: Normal rate and regular rhythm.  Pulmonary:     Effort: Pulmonary effort is normal. No respiratory distress.     Breath sounds: Normal breath sounds.  Abdominal:     General: Abdomen is flat.     Palpations: Abdomen is soft.     Tenderness: There is no abdominal tenderness.  Musculoskeletal:     Right lower leg: No edema.     Left lower leg: No edema.     Comments: No midline C, T, L-spine tenderness.  No chest wall tenderness or crepitus.  Full painless range of motion at the bilateral upper extremities including the shoulders, elbows, wrists, hand and fingers, and in the bilateral lower extremities including the hips, knees, ankle, toes.  No focal bony tenderness, injury or deformity.  Skin:    General: Skin is warm and dry.     Capillary Refill: Capillary refill takes less than 2 seconds.  Neurological:     Mental Status: He is alert and oriented to person, place, and time. Mental status is  at baseline.  Psychiatric:        Mood and Affect: Mood normal.        Behavior: Behavior normal.     ED Results and Treatments Labs (all labs ordered are listed, but only abnormal results are displayed) Labs Reviewed  BASIC METABOLIC PANEL - Abnormal; Notable for the following components:      Result  Value   Glucose, Bld 107 (*)    Creatinine, Ser 1.33 (*)    Calcium 8.5 (*)    GFR, Estimated 52 (*)    All other components within normal limits  CBC WITH DIFFERENTIAL/PLATELET - Abnormal; Notable for the following components:   WBC 3.9 (*)    RBC 3.69 (*)    Hemoglobin 9.4 (*)    HCT 29.1 (*)    MCV 78.9 (*)    MCH 25.5 (*)    RDW 16.1 (*)    Platelets 122 (*)    Lymphs Abs 0.6 (*)    All other components within normal limits                                                                                                                          Radiology CT Cervical Spine Wo Contrast  Result Date: 01/16/2022 CLINICAL DATA:  Trauma, fall EXAM: CT CERVICAL SPINE WITHOUT CONTRAST TECHNIQUE: Multidetector CT imaging of the cervical spine was performed without intravenous contrast. Multiplanar CT image reconstructions were also generated. RADIATION DOSE REDUCTION: This exam was performed according to the departmental dose-optimization program which includes automated exposure control, adjustment of the mA and/or kV according to patient size and/or use of iterative reconstruction technique. COMPARISON:  04/07/2021 FINDINGS: Alignment: There is minimal anterolisthesis at C3-C4 level with no significant interval change. There is minimal levoscoliosis. Skull base and vertebrae: No recent fracture is seen. Degenerative changes are noted at multiple levels in cervical spine and upper thoracic spine. Soft tissues and spinal canal: There is extrinsic pressure over the ventral margin of thecal sac caused by posterior bony spurs more so at C4-C5 and C5-C6 levels. Disc levels: There is encroachment of neural foramina by bony spurs and facet hypertrophy from C2-T3 levels. Upper chest: No acute findings are seen. Other: None. IMPRESSION: No recent fracture is seen in cervical spine. There is minimal anterolisthesis at C3-C4 level which has not changed significantly suggesting remote ligament injury and facet  degeneration. There is encroachment of neural foramina at multiple levels in cervical spine and upper thoracic spine. Electronically Signed   By: Elmer Picker M.D.   On: 01/16/2022 12:20   CT Head Wo Contrast  Result Date: 01/16/2022 CLINICAL DATA:  Trauma, fall EXAM: CT HEAD WITHOUT CONTRAST TECHNIQUE: Contiguous axial images were obtained from the base of the skull through the vertex without intravenous contrast. RADIATION DOSE REDUCTION: This exam was performed according to the departmental dose-optimization program which includes automated exposure control, adjustment of the mA and/or kV according to patient size and/or use of iterative reconstruction  technique. COMPARISON:  04/07/2021 FINDINGS: Brain: No acute intracranial findings are seen. There are no signs of bleeding within the cranium. Cortical sulci are prominent. There is prominence of third and both lateral ventricles. There is decreased density in periventricular and subcortical white matter. Vascular: Unremarkable. Skull: No fracture is seen in calvarium. Sinuses/Orbits: There is mild mucosal thickening in ethmoid and left maxillary sinuses. Other: None. IMPRESSION: No acute intracranial findings are seen. Central and cortical atrophy. Small-vessel disease. Electronically Signed   By: Elmer Picker M.D.   On: 01/16/2022 12:12   DG Chest Port 1 View  Result Date: 01/16/2022 CLINICAL DATA:  Weakness. EXAM: PORTABLE CHEST 1 VIEW COMPARISON:  Chest x-ray dated April 27, 2021. FINDINGS: Unchanged left chest wall pacemaker. Unchanged mild cardiomegaly. Normal pulmonary vascularity. No focal consolidation, pleural effusion, or pneumothorax. No acute osseous abnormality. IMPRESSION: No active disease. Electronically Signed   By: Titus Dubin M.D.   On: 01/16/2022 11:37    Pertinent labs & imaging results that were available during my care of the patient were reviewed by me and considered in my medical decision making (see MDM for  details).  Medications Ordered in ED Medications  sodium chloride 0.9 % bolus 1,000 mL (0 mLs Intravenous Stopped 01/16/22 1353)  acetaminophen (TYLENOL) tablet 1,000 mg (1,000 mg Oral Given 01/16/22 1204)  lidocaine (PF) (XYLOCAINE) 1 % injection 10 mL (10 mLs Intradermal Given by Other 01/16/22 1451)                                                                                                                                     Procedures .[name] KitchenLaceration Repair  Date/Time: 01/16/2022 4:18 PM  Performed by: Cristie Hem, MD Authorized by: Cristie Hem, MD   Consent:    Consent obtained:  Verbal   Consent given by:  Patient   Risks, benefits, and alternatives were discussed: yes     Risks discussed:  Infection, pain, retained foreign body, need for additional repair, poor cosmetic result, vascular damage and poor wound healing   Alternatives discussed:  No treatment Universal protocol:    Patient identity confirmed:  Verbally with patient and arm band Anesthesia:    Anesthesia method:  Local infiltration   Local anesthetic:  Lidocaine 1% w/o epi Laceration details:    Location:  Face   Face location:  R eyebrow   Length (cm):  2 Exploration:    Limited defect created (wound extended): no     Wound exploration: entire depth of wound visualized     Contaminated: no   Treatment:    Area cleansed with:  Saline   Amount of cleaning:  Standard   Irrigation solution:  Sterile saline   Irrigation method:  Syringe   Visualized foreign bodies/material removed: no     Debridement:  None   Undermining:  None   Scar revision: no   Skin repair:    Repair  method:  Sutures   Suture size:  5-0   Suture material:  Fast-absorbing gut   Suture technique:  Simple interrupted   Number of sutures:  5 Approximation:    Approximation:  Close Repair type:    Repair type:  Simple Post-procedure details:    Dressing:  Antibiotic ointment .[name] KitchenLaceration Repair  Date/Time: 01/16/2022  4:19 PM  Performed by: Cristie Hem, MD Authorized by: Cristie Hem, MD   Consent:    Consent obtained:  Verbal   Consent given by:  Patient   Risks, benefits, and alternatives were discussed: yes     Risks discussed:  Infection, need for additional repair, nerve damage, poor cosmetic result, pain, retained foreign body and poor wound healing   Alternatives discussed:  No treatment Universal protocol:    Patient identity confirmed:  Verbally with patient and arm band Anesthesia:    Anesthesia method:  None Laceration details:    Location:  Face   Face location:  Nose   Length (cm):  1 Treatment:    Area cleansed with:  Saline   Amount of cleaning:  Standard   Irrigation solution:  Sterile saline   Irrigation method:  Syringe   Visualized foreign bodies/material removed: no     Debridement:  None   Undermining:  None   Scar revision: no   Skin repair:    Repair method:  Tissue adhesive Approximation:    Approximation:  Close Repair type:    Repair type:  Simple Post-procedure details:    Dressing:  Open (no dressing)   (including critical care time)  Medical Decision Making / ED Course   MDM:  89 year old male presenting after fall.  Given age and head injury will obtain CT head and neck to further evaluate.  Tetanus updated last spring.  Will obtain basic labs given fall.  Will irrigate laceration and repair.  If no intracranial injury and wound repaired, patient able to ambulate, likely discharge to home.  Clinical Course as of 01/16/22 1620  Thu Jan 16, 2022  1615 Daughter at bedside, denies any additional history.  Wounds repaired, see procedure note.  Patient with mild hypertension, did not take blood pressure medications today.  Instructed them to take this at home.  Daughter will be with patient today and lives close by. Will discharge patient to home. All questions answered. Patient comfortable with plan of discharge. Return precautions discussed  with patient and specified on the after visit summary.  [WS]    Clinical Course User Index [WS] Cristie Hem, MD     Additional history obtained: -Additional history obtained from family and ems -External records from outside source obtained and reviewed including: Chart review including previous notes, labs, imaging, consultation notes including ED visit for fall 12/08/21   Lab Tests: -I ordered, reviewed, and interpreted labs.   The pertinent results include:   Labs Reviewed  BASIC METABOLIC PANEL - Abnormal; Notable for the following components:      Result Value   Glucose, Bld 107 (*)    Creatinine, Ser 1.33 (*)    Calcium 8.5 (*)    GFR, Estimated 52 (*)    All other components within normal limits  CBC WITH DIFFERENTIAL/PLATELET - Abnormal; Notable for the following components:   WBC 3.9 (*)    RBC 3.69 (*)    Hemoglobin 9.4 (*)    HCT 29.1 (*)    MCV 78.9 (*)    MCH 25.5 (*)    RDW 16.1 (*)  Platelets 122 (*)    Lymphs Abs 0.6 (*)    All other components within normal limits    Notable for stable anemia, stable CKD  EKG   EKG Interpretation  Date/Time:  Thursday January 16 2022 12:34:57 EST Ventricular Rate:  60 PR Interval:  191 QRS Duration: 120 QT Interval:  461 QTC Calculation: 461 R Axis:   -49 Text Interpretation: AV PACED RHYTHM No further analysis Confirmed by Garnette Gunner [phone]) on 01/16/2022 1:08:30 PM         Imaging Studies ordered: I ordered imaging studies including CT head and neck On my interpretation imaging demonstrates no acute process I independently visualized and interpreted imaging. I agree with the radiologist interpretation   Medicines ordered and prescription drug management: Meds ordered this encounter  Medications   sodium chloride 0.9 % bolus 1,000 mL   DISCONTD: Tdap (BOOSTRIX) injection 0.5 mL   acetaminophen (TYLENOL) tablet 1,000 mg   lidocaine (PF) (XYLOCAINE) 1 % injection 10 mL    -I have  reviewed the patients home medicines and have made adjustments as needed  Social Determinants of Health:  Diagnosis or treatment significantly limited by social determinants of health: lives alone   Reevaluation: After the interventions noted above, I reevaluated the patient and found that they have improved  Co morbidities that complicate the patient evaluation  Past Medical History:  Diagnosis Date   Bilateral carotid artery stenosis without cerebral infarction    ASYMPTOMATIC--  BILATERAL ICA  50-69%  PER CARDIOLOGIST NOTE, DR Einar Gip   Bilateral carotid bruits    LEFT  soft bruit, right no no carotid bruit per 07-24-2014 dr Einar Gip note   CKD (chronic kidney disease)    dr foster [place] kidney, ckd stage 3 per lov 02-16-2019   Complete heart block ([place]) 55/73/2202   Complication of anesthesia    1 episode atrial fib 02-09-2014 [facility] and followed up with cardiology, not further issue   Coronary artery disease    NON-OBSTRUCTIVE CAD  per cath 08-06-2004 HX PALPITATIONS-AND -TACHYCARDIA-   Encounter for care of pacemaker 04/26/2021   First degree heart block    Full dentures    GERD (gastroesophageal reflux disease)    Heart palpitations    Not any more   History of atrial fibrillation without current medication    EPISODE OF AFIB WITH RVR INTRAOPERATIVELY 02-09-2014 AT [facility]--  RESOLVED AND PT FOLLOWED UP WITH CARDIOLOGIST  DR Einar Gip   History of kidney stones    Hyperlipidemia    Hypertension    Memory loss    Nephrolithiasis    RIGHT   Organic impotence    Pacemaker: [employer] ASSURITY DR-RF - [id number] 04/26/2021   Psoriasis    Clearing up   PSVT (paroxysmal supraventricular tachycardia)    Right ureteral calculus    S/P radiofrequency ablation operation for arrhythmia-SVT 04/28/2014   Simple renal cyst    right   Wears glasses    Wears hearing aid    BILATERAL      Dispostion: Disposition decision including need for hospitalization was  considered, and patient discharged from emergency department.    Final Clinical Impression(s) / ED Diagnoses Final diagnoses:  Facial laceration, initial encounter  Fall, initial encounter     This chart was dictated using voice recognition software.  Despite best efforts to proofread,  errors can occur which can change the documentation meaning.    Cristie Hem, MD 01/16/22 1620

## 2022-01-16 NOTE — ED Notes (Signed)
Pt provided discharge instructions and prescription information. Pt was given the opportunity to ask questions and questions were answered.   

## 2022-01-16 NOTE — Discharge Instructions (Signed)
We evaluated you for your fall.  We did not see any signs of dangerous injury.  You had a laceration on your face and nose which we repaired.  The laceration by your eye was repaired with dissolvable sutures which should follow-up in 5 to 7 days.  We  placed skin glue on your nose which should fall off by itself.  Please keep your wounds clean and dry.  Please be careful when walking at home in the future to avoid future falls.  Please return to the emergency department if you develop any new symptoms such as a severe headache, vomiting, redness or warmth of the wounds, drainage of pus, fevers, or any other concerning symptoms.

## 2022-01-22 DIAGNOSIS — H524 Presbyopia: Secondary | ICD-10-CM | POA: Diagnosis not present

## 2022-01-22 DIAGNOSIS — Z961 Presence of intraocular lens: Secondary | ICD-10-CM | POA: Diagnosis not present

## 2022-01-22 DIAGNOSIS — H349 Unspecified retinal vascular occlusion: Secondary | ICD-10-CM | POA: Diagnosis not present

## 2022-01-27 DIAGNOSIS — I442 Atrioventricular block, complete: Secondary | ICD-10-CM | POA: Diagnosis not present

## 2022-01-27 DIAGNOSIS — Z950 Presence of cardiac pacemaker: Secondary | ICD-10-CM | POA: Diagnosis not present

## 2022-01-31 ENCOUNTER — Observation Stay (HOSPITAL_COMMUNITY): Payer: Medicare Other

## 2022-01-31 ENCOUNTER — Inpatient Hospital Stay (HOSPITAL_COMMUNITY)
Admission: EM | Admit: 2022-01-31 | Discharge: 2022-02-06 | DRG: 605 | Disposition: A | Discharge status: Skilled Nursing Facility | Payer: Medicare Other | Attending: Internal Medicine | Admitting: Internal Medicine

## 2022-01-31 ENCOUNTER — Encounter (HOSPITAL_COMMUNITY): Payer: Self-pay

## 2022-01-31 ENCOUNTER — Emergency Department (HOSPITAL_COMMUNITY): Payer: Medicare Other

## 2022-01-31 DIAGNOSIS — Z9842 Cataract extraction status, left eye: Secondary | ICD-10-CM

## 2022-01-31 DIAGNOSIS — N1832 Chronic kidney disease, stage 3b: Secondary | ICD-10-CM | POA: Diagnosis not present

## 2022-01-31 DIAGNOSIS — Y92009 Unspecified place in unspecified non-institutional (private) residence as the place of occurrence of the external cause: Secondary | ICD-10-CM | POA: Diagnosis not present

## 2022-01-31 DIAGNOSIS — I10 Essential (primary) hypertension: Secondary | ICD-10-CM | POA: Diagnosis not present

## 2022-01-31 DIAGNOSIS — G319 Degenerative disease of nervous system, unspecified: Secondary | ICD-10-CM | POA: Diagnosis not present

## 2022-01-31 DIAGNOSIS — I442 Atrioventricular block, complete: Secondary | ICD-10-CM | POA: Diagnosis not present

## 2022-01-31 DIAGNOSIS — Z9841 Cataract extraction status, right eye: Secondary | ICD-10-CM

## 2022-01-31 DIAGNOSIS — E876 Hypokalemia: Secondary | ICD-10-CM | POA: Diagnosis present

## 2022-01-31 DIAGNOSIS — W19XXXA Unspecified fall, initial encounter: Principal | ICD-10-CM

## 2022-01-31 DIAGNOSIS — Z79899 Other long term (current) drug therapy: Secondary | ICD-10-CM

## 2022-01-31 DIAGNOSIS — N179 Acute kidney failure, unspecified: Secondary | ICD-10-CM | POA: Diagnosis present

## 2022-01-31 DIAGNOSIS — I6782 Cerebral ischemia: Secondary | ICD-10-CM | POA: Diagnosis not present

## 2022-01-31 DIAGNOSIS — T68XXXA Hypothermia, initial encounter: Secondary | ICD-10-CM | POA: Diagnosis not present

## 2022-01-31 DIAGNOSIS — W1830XA Fall on same level, unspecified, initial encounter: Secondary | ICD-10-CM | POA: Diagnosis present

## 2022-01-31 DIAGNOSIS — S301XXA Contusion of abdominal wall, initial encounter: Secondary | ICD-10-CM | POA: Diagnosis not present

## 2022-01-31 DIAGNOSIS — N189 Chronic kidney disease, unspecified: Secondary | ICD-10-CM | POA: Diagnosis present

## 2022-01-31 DIAGNOSIS — Z4802 Encounter for removal of sutures: Secondary | ICD-10-CM | POA: Diagnosis not present

## 2022-01-31 DIAGNOSIS — I959 Hypotension, unspecified: Secondary | ICD-10-CM | POA: Diagnosis not present

## 2022-01-31 DIAGNOSIS — N210 Calculus in bladder: Secondary | ICD-10-CM | POA: Diagnosis not present

## 2022-01-31 DIAGNOSIS — Z66 Do not resuscitate: Secondary | ICD-10-CM | POA: Diagnosis present

## 2022-01-31 DIAGNOSIS — Z1152 Encounter for screening for COVID-19: Secondary | ICD-10-CM | POA: Diagnosis not present

## 2022-01-31 DIAGNOSIS — Z87891 Personal history of nicotine dependence: Secondary | ICD-10-CM

## 2022-01-31 DIAGNOSIS — Z9049 Acquired absence of other specified parts of digestive tract: Secondary | ICD-10-CM

## 2022-01-31 DIAGNOSIS — R413 Other amnesia: Secondary | ICD-10-CM | POA: Diagnosis present

## 2022-01-31 DIAGNOSIS — S20224A Contusion of middle back wall of thorax, initial encounter: Secondary | ICD-10-CM | POA: Diagnosis present

## 2022-01-31 DIAGNOSIS — R269 Unspecified abnormalities of gait and mobility: Secondary | ICD-10-CM | POA: Diagnosis present

## 2022-01-31 DIAGNOSIS — S0180XA Unspecified open wound of other part of head, initial encounter: Secondary | ICD-10-CM | POA: Diagnosis not present

## 2022-01-31 DIAGNOSIS — I129 Hypertensive chronic kidney disease with stage 1 through stage 4 chronic kidney disease, or unspecified chronic kidney disease: Secondary | ICD-10-CM | POA: Diagnosis present

## 2022-01-31 DIAGNOSIS — K219 Gastro-esophageal reflux disease without esophagitis: Secondary | ICD-10-CM | POA: Diagnosis present

## 2022-01-31 DIAGNOSIS — N183 Chronic kidney disease, stage 3 unspecified: Secondary | ICD-10-CM | POA: Diagnosis not present

## 2022-01-31 DIAGNOSIS — M5030 Other cervical disc degeneration, unspecified cervical region: Secondary | ICD-10-CM | POA: Diagnosis not present

## 2022-01-31 DIAGNOSIS — R296 Repeated falls: Secondary | ICD-10-CM | POA: Diagnosis present

## 2022-01-31 DIAGNOSIS — I480 Paroxysmal atrial fibrillation: Secondary | ICD-10-CM | POA: Diagnosis present

## 2022-01-31 DIAGNOSIS — R4701 Aphasia: Secondary | ICD-10-CM | POA: Diagnosis present

## 2022-01-31 DIAGNOSIS — E872 Acidosis, unspecified: Secondary | ICD-10-CM | POA: Diagnosis present

## 2022-01-31 DIAGNOSIS — E785 Hyperlipidemia, unspecified: Secondary | ICD-10-CM | POA: Diagnosis present

## 2022-01-31 DIAGNOSIS — S81011A Laceration without foreign body, right knee, initial encounter: Secondary | ICD-10-CM | POA: Diagnosis not present

## 2022-01-31 DIAGNOSIS — T148XXA Other injury of unspecified body region, initial encounter: Secondary | ICD-10-CM | POA: Diagnosis not present

## 2022-01-31 DIAGNOSIS — R739 Hyperglycemia, unspecified: Secondary | ICD-10-CM | POA: Diagnosis present

## 2022-01-31 DIAGNOSIS — M25561 Pain in right knee: Secondary | ICD-10-CM | POA: Diagnosis present

## 2022-01-31 DIAGNOSIS — R031 Nonspecific low blood-pressure reading: Secondary | ICD-10-CM | POA: Diagnosis present

## 2022-01-31 DIAGNOSIS — R918 Other nonspecific abnormal finding of lung field: Secondary | ICD-10-CM | POA: Diagnosis not present

## 2022-01-31 DIAGNOSIS — I2510 Atherosclerotic heart disease of native coronary artery without angina pectoris: Secondary | ICD-10-CM | POA: Diagnosis present

## 2022-01-31 DIAGNOSIS — Z888 Allergy status to other drugs, medicaments and biological substances status: Secondary | ICD-10-CM

## 2022-01-31 DIAGNOSIS — S0990XA Unspecified injury of head, initial encounter: Secondary | ICD-10-CM | POA: Diagnosis not present

## 2022-01-31 DIAGNOSIS — I611 Nontraumatic intracerebral hemorrhage in hemisphere, cortical: Secondary | ICD-10-CM | POA: Diagnosis not present

## 2022-01-31 DIAGNOSIS — R680 Hypothermia, not associated with low environmental temperature: Secondary | ICD-10-CM | POA: Diagnosis present

## 2022-01-31 DIAGNOSIS — R6510 Systemic inflammatory response syndrome (SIRS) of non-infectious origin without acute organ dysfunction: Secondary | ICD-10-CM | POA: Diagnosis present

## 2022-01-31 DIAGNOSIS — F0390 Unspecified dementia without behavioral disturbance: Secondary | ICD-10-CM | POA: Diagnosis present

## 2022-01-31 DIAGNOSIS — Z950 Presence of cardiac pacemaker: Secondary | ICD-10-CM

## 2022-01-31 DIAGNOSIS — S80211A Abrasion, right knee, initial encounter: Secondary | ICD-10-CM | POA: Diagnosis present

## 2022-01-31 DIAGNOSIS — S199XXA Unspecified injury of neck, initial encounter: Secondary | ICD-10-CM | POA: Diagnosis not present

## 2022-01-31 DIAGNOSIS — Z8249 Family history of ischemic heart disease and other diseases of the circulatory system: Secondary | ICD-10-CM

## 2022-01-31 DIAGNOSIS — S0181XA Laceration without foreign body of other part of head, initial encounter: Secondary | ICD-10-CM | POA: Diagnosis not present

## 2022-01-31 DIAGNOSIS — Z7982 Long term (current) use of aspirin: Secondary | ICD-10-CM

## 2022-01-31 DIAGNOSIS — N400 Enlarged prostate without lower urinary tract symptoms: Secondary | ICD-10-CM | POA: Diagnosis not present

## 2022-01-31 DIAGNOSIS — D62 Acute posthemorrhagic anemia: Secondary | ICD-10-CM | POA: Diagnosis not present

## 2022-01-31 DIAGNOSIS — Z043 Encounter for examination and observation following other accident: Secondary | ICD-10-CM | POA: Diagnosis not present

## 2022-01-31 DIAGNOSIS — I700 Atherosclerosis of aorta: Secondary | ICD-10-CM | POA: Diagnosis not present

## 2022-01-31 DIAGNOSIS — M2578 Osteophyte, vertebrae: Secondary | ICD-10-CM | POA: Diagnosis not present

## 2022-01-31 DIAGNOSIS — R509 Fever, unspecified: Secondary | ICD-10-CM | POA: Diagnosis not present

## 2022-01-31 DIAGNOSIS — M25559 Pain in unspecified hip: Secondary | ICD-10-CM | POA: Diagnosis not present

## 2022-01-31 DIAGNOSIS — S299XXA Unspecified injury of thorax, initial encounter: Secondary | ICD-10-CM | POA: Diagnosis not present

## 2022-01-31 DIAGNOSIS — Z87442 Personal history of urinary calculi: Secondary | ICD-10-CM

## 2022-01-31 DIAGNOSIS — Z974 Presence of external hearing-aid: Secondary | ICD-10-CM

## 2022-01-31 LAB — COMPREHENSIVE METABOLIC PANEL
ALT: 15 U/L (ref 0–44)
AST: 21 U/L (ref 15–41)
Albumin: 3.3 g/dL — ABNORMAL LOW (ref 3.5–5.0)
Alkaline Phosphatase: 84 U/L (ref 38–126)
Anion gap: 11 (ref 5–15)
BUN: 21 mg/dL (ref 8–23)
CO2: 23 mmol/L (ref 22–32)
Calcium: 8.2 mg/dL — ABNORMAL LOW (ref 8.9–10.3)
Chloride: 106 mmol/L (ref 98–111)
Creatinine, Ser: 1.74 mg/dL — ABNORMAL HIGH (ref 0.61–1.24)
GFR, Estimated: 38 mL/min — ABNORMAL LOW (ref >60)
Glucose, Bld: 247 mg/dL — ABNORMAL HIGH (ref 70–99)
Potassium: 3.5 mmol/L (ref 3.5–5.1)
Sodium: 140 mmol/L (ref 135–145)
Total Bilirubin: 1.7 mg/dL — ABNORMAL HIGH (ref 0.3–1.2)
Total Protein: 5.1 g/dL — ABNORMAL LOW (ref 6.5–8.1)

## 2022-01-31 LAB — URINALYSIS, ROUTINE W REFLEX MICROSCOPIC
Bilirubin Urine: NEGATIVE
Glucose, UA: NEGATIVE mg/dL
Ketones, ur: NEGATIVE mg/dL
Leukocytes,Ua: NEGATIVE
Nitrite: NEGATIVE
Protein, ur: 100 mg/dL — AB
RBC / HPF: >50 RBC/hpf — ABNORMAL HIGH (ref 0–5)
Specific Gravity, Urine: 1.019 (ref 1.005–1.030)
pH: 5 (ref 5.0–8.0)

## 2022-01-31 LAB — CBC
HCT: 22.4 % — ABNORMAL LOW (ref 39.0–52.0)
Hemoglobin: 7.1 g/dL — ABNORMAL LOW (ref 13.0–17.0)
MCH: 25.2 pg — ABNORMAL LOW (ref 26.0–34.0)
MCHC: 31.7 g/dL (ref 30.0–36.0)
MCV: 79.4 fL — ABNORMAL LOW (ref 80.0–100.0)
Platelets: 179 10*3/uL (ref 150–400)
RBC: 2.82 MIL/uL — ABNORMAL LOW (ref 4.22–5.81)
RDW: 16.5 % — ABNORMAL HIGH (ref 11.5–15.5)
WBC: 14.4 10*3/uL — ABNORMAL HIGH (ref 4.0–10.5)
nRBC: 0 % (ref 0.0–0.2)

## 2022-01-31 LAB — TROPONIN I (HIGH SENSITIVITY)
Troponin I (High Sensitivity): 25 ng/L — ABNORMAL HIGH (ref <18)
Troponin I (High Sensitivity): 39 ng/L — ABNORMAL HIGH (ref <18)

## 2022-01-31 LAB — HEMOGLOBIN AND HEMATOCRIT, BLOOD
HCT: 20.2 % — ABNORMAL LOW (ref 39.0–52.0)
Hemoglobin: 6.7 g/dL — CL (ref 13.0–17.0)

## 2022-01-31 LAB — RESP PANEL BY RT-PCR (RSV, FLU A&B, COVID)  RVPGX2
Influenza A by PCR: NEGATIVE
Influenza B by PCR: NEGATIVE
Resp Syncytial Virus by PCR: NEGATIVE
SARS Coronavirus 2 by RT PCR: NEGATIVE

## 2022-01-31 LAB — LACTIC ACID, PLASMA
Lactic Acid, Venous: 3 mmol/L (ref 0.5–1.9)
Lactic Acid, Venous: 1.3 mmol/L (ref 0.5–1.9)

## 2022-01-31 LAB — PREPARE RBC (CROSSMATCH)

## 2022-01-31 LAB — PROTIME-INR
INR: 1.3 — ABNORMAL HIGH (ref 0.8–1.2)
Prothrombin Time: 15.6 seconds — ABNORMAL HIGH (ref 11.4–15.2)

## 2022-01-31 LAB — CK: Total CK: 124 U/L (ref 49–397)

## 2022-01-31 LAB — HEMOGLOBIN A1C
Hgb A1c MFr Bld: 4 % — ABNORMAL LOW (ref 4.8–5.6)
Mean Plasma Glucose: 68.1 mg/dL

## 2022-01-31 MED ORDER — SODIUM CHLORIDE 0.9% IV SOLUTION: Freq: Once | INTRAVENOUS | Status: DC

## 2022-01-31 MED ORDER — TRAMADOL HCL 50 MG PO TABS
50.0000 mg | ORAL_TABLET | Freq: Four times a day (QID) | ORAL | Status: DC | PRN
  Administered 2022-02-01 (×2): 50 mg via ORAL
  Filled 2022-01-31 (×2): qty 1

## 2022-01-31 MED ORDER — SODIUM CHLORIDE 0.9% IV SOLUTION: Freq: Once | INTRAVENOUS | Status: AC

## 2022-01-31 MED ORDER — TRAZODONE HCL 50 MG PO TABS
50.0000 mg | ORAL_TABLET | Freq: Every day | ORAL | Status: DC | PRN
  Administered 2022-02-02 – 2022-02-05 (×4): 50 mg via ORAL
  Filled 2022-01-31 (×4): qty 1

## 2022-01-31 MED ORDER — LIDOCAINE HCL (PF) 1 % IJ SOLN
5.0000 mL | Freq: Once | INTRAMUSCULAR | Status: AC
  Administered 2022-01-31: 5 mL
  Filled 2022-01-31: qty 5

## 2022-01-31 MED ORDER — PANTOPRAZOLE SODIUM 40 MG PO TBEC
40.0000 mg | DELAYED_RELEASE_TABLET | Freq: Two times a day (BID) | ORAL | Status: DC
  Administered 2022-01-31 – 2022-02-06 (×13): 40 mg via ORAL
  Filled 2022-01-31 (×13): qty 1

## 2022-01-31 MED ORDER — ALBUTEROL SULFATE (2.5 MG/3ML) 0.083% IN NEBU: 2.5000 mg | INHALATION_SOLUTION | Freq: Four times a day (QID) | RESPIRATORY_TRACT | Status: DC | PRN

## 2022-01-31 MED ORDER — CALCIUM GLUCONATE-NACL 1-0.675 GM/50ML-% IV SOLN
1.0000 g | Freq: Once | INTRAVENOUS | Status: AC
  Administered 2022-02-01: 1000 mg via INTRAVENOUS
  Filled 2022-01-31: qty 50

## 2022-01-31 MED ORDER — TAMSULOSIN HCL 0.4 MG PO CAPS
0.4000 mg | ORAL_CAPSULE | Freq: Every day | ORAL | Status: DC
  Administered 2022-01-31 – 2022-02-05 (×6): 0.4 mg via ORAL
  Filled 2022-01-31 (×6): qty 1

## 2022-01-31 MED ORDER — LACTATED RINGERS IV SOLN: INTRAVENOUS | Status: DC

## 2022-01-31 MED ORDER — ATORVASTATIN CALCIUM 10 MG PO TABS
20.0000 mg | ORAL_TABLET | Freq: Every day | ORAL | Status: DC
  Administered 2022-01-31 – 2022-02-06 (×7): 20 mg via ORAL
  Filled 2022-01-31 (×7): qty 2

## 2022-01-31 MED ORDER — ACETAMINOPHEN 650 MG RE SUPP: 650.0000 mg | Freq: Four times a day (QID) | RECTAL | Status: DC | PRN

## 2022-01-31 MED ORDER — ACETAMINOPHEN 325 MG PO TABS: 650.0000 mg | ORAL_TABLET | Freq: Four times a day (QID) | ORAL | Status: DC | PRN

## 2022-01-31 MED ORDER — SODIUM CHLORIDE 0.9% FLUSH
3.0000 mL | Freq: Two times a day (BID) | INTRAVENOUS | Status: DC
  Administered 2022-01-31 – 2022-02-06 (×8): 3 mL via INTRAVENOUS

## 2022-01-31 MED ORDER — DIPHENHYDRAMINE-APAP (SLEEP) 25-500 MG PO TABS: 1.0000 | ORAL_TABLET | Freq: Every evening | ORAL | Status: DC | PRN

## 2022-01-31 MED ORDER — FERROUS SULFATE 325 (65 FE) MG PO TABS
325.0000 mg | ORAL_TABLET | Freq: Every day | ORAL | Status: DC
  Administered 2022-02-01 – 2022-02-05 (×5): 325 mg via ORAL
  Filled 2022-01-31 (×5): qty 1

## 2022-01-31 MED ORDER — HYDROCODONE-ACETAMINOPHEN 5-325 MG PO TABS: 1.0000 | ORAL_TABLET | Freq: Four times a day (QID) | ORAL | Status: DC | PRN

## 2022-01-31 MED ORDER — DIPHENHYDRAMINE HCL 25 MG PO CAPS: 25.0000 mg | ORAL_CAPSULE | Freq: Every evening | ORAL | Status: DC | PRN

## 2022-01-31 MED ORDER — SODIUM CHLORIDE 0.9 % IV BOLUS
1000.0000 mL | Freq: Once | INTRAVENOUS | Status: AC
  Administered 2022-01-31: 1000 mL via INTRAVENOUS

## 2022-01-31 NOTE — ED Provider Notes (Signed)
[facility] Emergency Department Provider Note MRN:  [id number]  Arrival date & time: 01/31/22     Chief Complaint   Fall   History of Present Illness   Brandon Fischer is a 89 y.o. year-old male with a history of CAD, CKD presenting to the ED with chief complaint of fall  Unwitnessed fall, potentially on the ground for several hours.  Covered in blood.  Either with altered mental status or dementia.  Review of Systems  I was unable to obtain a full/accurate HPI, PMH, or ROS due to the patient's altered mental status.  Patient's Health History    Past Medical History:  Diagnosis Date   Bilateral carotid artery stenosis without cerebral infarction    ASYMPTOMATIC--  BILATERAL ICA  50-69%  PER CARDIOLOGIST NOTE, DR Einar Gip   Bilateral carotid bruits    LEFT  soft bruit, right no no carotid bruit per 07-24-2014 dr Einar Gip note   CKD (chronic kidney disease)    dr foster Narda Amber kidney, ckd stage 3 per lov 02-16-2019   Complete heart block ([place]) 16/38/4665   Complication of anesthesia    1 episode atrial fib 02-09-2014 [facility] and followed up with cardiology, not further issue   Coronary artery disease    NON-OBSTRUCTIVE CAD  per cath 08-06-2004 HX PALPITATIONS-AND -TACHYCARDIA-   Encounter for care of pacemaker 04/26/2021   First degree heart block    Full dentures    GERD (gastroesophageal reflux disease)    Heart palpitations    Not any more   History of atrial fibrillation without current medication    EPISODE OF AFIB WITH RVR INTRAOPERATIVELY 02-09-2014 AT [facility]--  RESOLVED AND PT FOLLOWED UP WITH CARDIOLOGIST  DR Einar Gip   History of kidney stones    Hyperlipidemia    Hypertension    Memory loss    Nephrolithiasis    RIGHT   Organic impotence    Pacemaker: [employer] ASSURITY DR-RF - [id number] 04/26/2021   Psoriasis    Clearing up   PSVT (paroxysmal supraventricular tachycardia)    Right ureteral calculus    S/P radiofrequency  ablation operation for arrhythmia-SVT 04/28/2014   Simple renal cyst    right   Wears glasses    Wears hearing aid    BILATERAL    Past Surgical History:  Procedure Laterality Date   CARDIAC CATHETERIZATION  08-06-2004 dr Verlon Setting   (abnormal stress test) Non-obstructive CAD, pLAD 20-30%,  LCX 30-40%, pRCA 20-30%, dRCA 40%, distal LM  mild diffuse calcifation 20-30% tapering stenosis,  preserved LVSF ef 55-60%   CARPAL TUNNEL RELEASE Bilateral 2005   CATARACT EXTRACTION, BILATERAL Bilateral 2011   CHOLECYSTECTOMY     CHOLECYSTECTOMY OPEN  1982   and NEPHROLITHOTOMY   COLONOSCOPY W/ POLYPECTOMY  last one 04-05-2008   CYSTOSCOPY WITH LITHOLAPAXY Right 04/14/2017   Procedure: CYSTOSCOPY WITH LITHOLAPAXY/RIGHT RETROGRADE PLYOGRAM/RIGHT URETEROSCOPY;  Surgeon: [name], MD;  Location: [facility];  Service: Urology;  Laterality: Right;   CYSTOSCOPY WITH RETROGRADE PYELOGRAM, URETEROSCOPY AND STENT PLACEMENT Right 03/14/2014   Procedure: CYSTO/RIGHT RETROGRADE PYELOGRAM/RIGHT URETEROSCOPY/STONE EXTRACTION/STENT PLACEMENT;  Surgeon: Malka So, MD;  Location: [facility];  Service: Urology;  Laterality: Right;   CYSTOSCOPY WITH RETROGRADE PYELOGRAM, URETEROSCOPY AND STENT PLACEMENT Left 04/19/2019   Procedure: CYSTOSCOPY WITH LEFT  RETROGRADE LEFT , URETEROSCOPY WITH HOLMIUM LASER, BASKET EXTRACTION AND STENT PLACEMENT;  Surgeon: [name], MD;  Location: [facility];  Service: Urology;  Laterality: Left;  CYSTOSCOPY WITH RETROGRADE PYELOGRAM, URETEROSCOPY AND STENT PLACEMENT Left 03/30/2020   Procedure: CYSTOSCOPY WITH RETROGRADE PYELOGRAM, URETEROSCOPY, BASKETTING OF STONE AND STENT PLACEMENT;  Surgeon: Alexis Frock, MD;  Location: [facility];  Service: Urology;  Laterality: Left;   EYE SURGERY Bilateral    Cataracts removed   HOLMIUM LASER APPLICATION Right 40/98/1191   Procedure: RIGHT HOLMIUM LASER APPLICATION;  Surgeon: Malka So, MD;   Location: [facility];  Service: Urology;  Laterality: Right;   KNEE ARTHROSCOPY W/ DEBRIDEMENT Right 11/23/2002   and Synovectomy   LUMBAR LAMINECTOMY/DECOMPRESSION MICRODISCECTOMY N/A 07/15/2018   Procedure: Posterior lumbar decompression and fusion L4-5;  Surgeon: Melina Schools, MD;  Location: [place];  Service: Orthopedics;  Laterality: N/A;  4 hrs   NEPHROLITHOTOMY  01/16/2011   Procedure: NEPHROLITHOTOMY PERCUTANEOUS;  Surgeon: Malka So;  Location: [facility];  Service: Urology;  Laterality: Left;  C-Arm  Holmium Laser   NEPHROLITHOTOMY  1981   PACEMAKER IMPLANT N/A 04/26/2021   Procedure: PACEMAKER IMPLANT;  Surgeon: Constance Haw, MD;  Location: [place] [facility];  Service: Cardiovascular;  Laterality: N/A;   SUPRAVENTRICULAR TACHYCARDIA ABLATION N/A 04/28/2014   Procedure: SUPRAVENTRICULAR TACHYCARDIA ABLATION;  Surgeon: Evans Lance, MD;  Location: [facility];  Service: Cardiovascular;  Laterality: N/A;    Family History  Problem Relation Age of Onset   Cancer Mother        Ovarian or colon   Heart attack Father    Heart attack Brother     Social History   Socioeconomic History   Marital status: Widowed    Spouse name: Not on file   Number of children: 2   Years of education: Not on file   Highest education level: Some college, no degree  Occupational History   Occupation: postal service     Comment: retired  Tobacco Use   Smoking status: Former    Packs/day: 1.00    Years: 40.00    Total pack years: 40.00    Types: Cigarettes    Quit date: 02/06/1998    Years since quitting: 24.0   Smokeless tobacco: Never  Vaping Use   Vaping Use: Never used  Substance and Sexual Activity   Alcohol use: No   Drug use: No   Sexual activity: Not on file  Other Topics Concern   Not on file  Social History Narrative   06/25/21 lives alone, 2 children are in and out all day, has Medical alert   Social Determinants of Health   Financial Resource  Strain: Not on file  Food Insecurity: Not on file  Transportation Needs: Not on file  Physical Activity: Not on file  Stress: Not on file  Social Connections: Not on file  Intimate Partner Violence: Not on file     Physical Exam   Vitals:   01/31/22 0615 01/31/22 0633  BP: (!) 119/57 (!) 113/49  Pulse: 72 74  Resp: 13 15  Temp:  98 F (36.7 C)  SpO2: 100% 100%    CONSTITUTIONAL: Chronically ill-appearing, NAD NEURO/PSYCH: Awake, not oriented, repetitive response to questions EYES:  eyes equal and reactive ENT/NECK:  no LAD, no JVD CARDIO: Regular rate, well-perfused, normal S1 and S2 PULM:  CTAB no wheezing or rhonchi GI/GU:  non-distended, non-tender MSK/SPINE:  No gross deformities, no edema SKIN:  no rash, atraumatic   *Additional and/or pertinent findings included in MDM below  Diagnostic and Interventional Summary    EKG Interpretation  Date/Time:  Friday January 31 2022 02:53:01 EST Ventricular Rate:  82 PR Interval:  203 QRS Duration: 113 QT Interval:  422 QTC Calculation: 493 R Axis:   -63 Text Interpretation: Sinus rhythm Atrial premature complex Incomplete right bundle branch block Abnormal R-wave progression, late transition LVH with IVCD, LAD and secondary repol abnrm Probable inferior infarct, recent Confirmed by Gerlene Fee [phone]) on 01/31/2022 4:56:29 AM       Labs Reviewed  CBC - Abnormal; Notable for the following components:      Result Value   WBC 14.4 (*)    RBC 2.82 (*)    Hemoglobin 7.1 (*)    HCT 22.4 (*)    MCV 79.4 (*)    MCH 25.2 (*)    RDW 16.5 (*)    All other components within normal limits  COMPREHENSIVE METABOLIC PANEL - Abnormal; Notable for the following components:   Glucose, Bld 247 (*)    Creatinine, Ser 1.74 (*)    Calcium 8.2 (*)    Total Protein 5.1 (*)    Albumin 3.3 (*)    Total Bilirubin 1.7 (*)    GFR, Estimated 38 (*)    All other components within normal limits  LACTIC ACID, PLASMA - Abnormal; Notable  for the following components:   Lactic Acid, Venous 3.0 (*)    All other components within normal limits  PROTIME-INR - Abnormal; Notable for the following components:   Prothrombin Time 15.6 (*)    INR 1.3 (*)    All other components within normal limits  TROPONIN I (HIGH SENSITIVITY) - Abnormal; Notable for the following components:   Troponin I (High Sensitivity) 25 (*)    All other components within normal limits  TROPONIN I (HIGH SENSITIVITY) - Abnormal; Notable for the following components:   Troponin I (High Sensitivity) 39 (*)    All other components within normal limits  CULTURE, BLOOD (ROUTINE X 2)  CULTURE, BLOOD (ROUTINE X 2)  CK  URINALYSIS, ROUTINE W REFLEX MICROSCOPIC  TYPE AND SCREEN    CT CERVICAL SPINE WO CONTRAST  Final Result    CT HEAD WO CONTRAST (5MM)  Final Result    CT CHEST ABDOMEN PELVIS WO CONTRAST  Final Result    DG Chest Port 1 View  Final Result      Medications  lactated ringers infusion ( Intravenous New Bag/Given 01/31/22 0639)  sodium chloride 0.9 % bolus 1,000 mL (0 mLs Intravenous Stopped 01/31/22 0544)  lidocaine (PF) (XYLOCAINE) 1 % injection 5 mL (5 mLs Infiltration Given by Other 01/31/22 0545)     Procedures  /  Critical Care .Critical Care  Performed by: Maudie Flakes, MD Authorized by: Maudie Flakes, MD   Critical care provider statement:    Critical care time (minutes):  35   Critical care was necessary to treat or prevent imminent or life-threatening deterioration of the following conditions: Hypothermia.   Critical care was time spent personally by me on the following activities:  Development of treatment plan with patient or surrogate, discussions with consultants, evaluation of patient's response to treatment, examination of patient, ordering and review of laboratory studies, ordering and review of radiographic studies, ordering and performing treatments and interventions, pulse oximetry, re-evaluation of patient's  condition and review of old charts   ED Course and Medical Decision Making  Initial Impression and Ddx Differential diagnosis includes intracranial bleeding, arrhythmia, electrolyte disturbance, rhabdomyolysis, significant chest or abdominal trauma also considered.  Awaiting labs, CT imaging.  Past medical/surgical  history that increases complexity of ED encounter: Dementia complete heart block  Interpretation of Diagnostics I personally reviewed the EKG and my interpretation is as follows: Sinus rhythm  Labs reveal lactate elevation, acute kidney injury, hemoglobin of 7.1 down from 9  Patient Reassessment and Ultimate Disposition/Management     Plan is for hospitalist admission.  Likely to stepdown.  Patient management required discussion with the following services or consulting groups:  Hospitalist Service  Complexity of Problems Addressed Acute illness or injury that poses threat of life of bodily function  Additional Data Reviewed and Analyzed Further history obtained from: Further history from spouse/family member and Prior labs/imaging results  Additional Factors Impacting ED Encounter Risk Consideration of hospitalization  Barth Kirks. Sedonia Small, MD Victor [email or web address]  Final Clinical Impressions(s) / ED Diagnoses     ICD-10-CM   1. Fall, initial encounter  W19.XXXA     2. Hypothermia, initial encounter  T68.[facility]       ED Discharge Orders     None        Discharge Instructions Discussed with and Provided to Patient:   Discharge Instructions   None      Maudie Flakes, MD 01/31/22 [phone]

## 2022-01-31 NOTE — Progress Notes (Signed)
Pt. States he has DNR paper work at home. He wants to cont. DNR code status during this hospitalization. Dr. Tamala Julian made aware via page, awaiting orders  Anastasio Auerbach

## 2022-01-31 NOTE — ED Notes (Signed)
Pts temperature 95.71F rectal, pt placed on bear hugger. MD Bero aware.

## 2022-01-31 NOTE — ED Notes (Signed)
ED TO INPATIENT HANDOFF REPORT  ED Nurse Name and Phone #: Lucien Mons Name/Age/Gender Brandon Fischer 89 y.o. male Room/Bed: 011C/011C  Code Status   Code Status: Full Code  Home/SNF/Other Home Patient oriented to: self, place, time, and situation Is this baseline? Yes   Triage Complete: Triage complete  Chief Complaint Hypothermia [T68.XXXA] Fall [W19.XXXA]  Triage Note Pt to ED for fall with head strike. Pt got up to use bathroom, tripped and hit head on vase. Pt with several lacs. Lac to forehead, bleeding controlled. 500 cc blood loss per EMS. BG 368 per EMS. Pt complains right side hip pain. Denies thinner. AAOx4 122/68 80 20 100%    Allergies Allergies  Allergen Reactions   Bupropion     Other reaction(s): disoriented   Olmesartan     Other reaction(s): rash   Trazodone     Other reaction(s): unsteady gait 06/26/21 sometimes takes 1 as needed    Level of Care/Admitting Diagnosis ED Disposition     ED Disposition  Admit   Condition  --   [place]: [place] [[id number]]  Level of Care: Telemetry Medical [104]  May place patient in observation at [facility] or [place] if equivalent level of care is available:: No  Covid Evaluation: Asymptomatic - no recent exposure (last 10 days) testing not required  Diagnosis: Fall [[id number]]  Admitting Physician: Norval Morton [[id number]]  Attending Physician: Norval Morton [[id number]]          B Medical/Surgery History Past Medical History:  Diagnosis Date   Bilateral carotid artery stenosis without cerebral infarction    ASYMPTOMATIC--  BILATERAL ICA  50-69%  PER CARDIOLOGIST NOTE, DR Einar Gip   Bilateral carotid bruits    LEFT  soft bruit, right no no carotid bruit per 07-24-2014 dr Einar Gip note   CKD (chronic kidney disease)    dr foster [place] kidney, ckd stage 3 per lov 02-16-2019   Complete heart block ([place]) 33/82/5053   Complication of anesthesia    1 episode atrial  fib 02-09-2014 [facility] and followed up with cardiology, not further issue   Coronary artery disease    NON-OBSTRUCTIVE CAD  per cath 08-06-2004 HX PALPITATIONS-AND -TACHYCARDIA-   Encounter for care of pacemaker 04/26/2021   First degree heart block    Full dentures    GERD (gastroesophageal reflux disease)    Heart palpitations    Not any more   History of atrial fibrillation without current medication    EPISODE OF AFIB WITH RVR INTRAOPERATIVELY 02-09-2014 AT [facility]--  RESOLVED AND PT FOLLOWED UP WITH CARDIOLOGIST  DR Einar Gip   History of kidney stones    Hyperlipidemia    Hypertension    Memory loss    Nephrolithiasis    RIGHT   Organic impotence    Pacemaker: [employer] ASSURITY DR-RF - [id number] 04/26/2021   Psoriasis    Clearing up   PSVT (paroxysmal supraventricular tachycardia)    Right ureteral calculus    S/P radiofrequency ablation operation for arrhythmia-SVT 04/28/2014   Simple renal cyst    right   Wears glasses    Wears hearing aid    BILATERAL   Past Surgical History:  Procedure Laterality Date   CARDIAC CATHETERIZATION  08-06-2004 dr Verlon Setting   (abnormal stress test) Non-obstructive CAD, pLAD 20-30%,  LCX 30-40%, pRCA 20-30%, dRCA 40%, distal LM  mild diffuse calcifation 20-30% tapering stenosis,  preserved LVSF ef 55-60%  CARPAL TUNNEL RELEASE Bilateral 2005   CATARACT EXTRACTION, BILATERAL Bilateral 2011   CHOLECYSTECTOMY     CHOLECYSTECTOMY OPEN  1982   and NEPHROLITHOTOMY   COLONOSCOPY W/ POLYPECTOMY  last one 04-05-2008   CYSTOSCOPY WITH LITHOLAPAXY Right 04/14/2017   Procedure: CYSTOSCOPY WITH LITHOLAPAXY/RIGHT RETROGRADE PLYOGRAM/RIGHT URETEROSCOPY;  Surgeon: [name], MD;  Location: [facility];  Service: Urology;  Laterality: Right;   CYSTOSCOPY WITH RETROGRADE PYELOGRAM, URETEROSCOPY AND STENT PLACEMENT Right 03/14/2014   Procedure: CYSTO/RIGHT RETROGRADE PYELOGRAM/RIGHT URETEROSCOPY/STONE EXTRACTION/STENT PLACEMENT;  Surgeon: Malka So, MD;  Location: [facility];  Service: Urology;  Laterality: Right;   CYSTOSCOPY WITH RETROGRADE PYELOGRAM, URETEROSCOPY AND STENT PLACEMENT Left 04/19/2019   Procedure: CYSTOSCOPY WITH LEFT  RETROGRADE LEFT , URETEROSCOPY WITH HOLMIUM LASER, BASKET EXTRACTION AND STENT PLACEMENT;  Surgeon: [name], MD;  Location: [facility];  Service: Urology;  Laterality: Left;   CYSTOSCOPY WITH RETROGRADE PYELOGRAM, URETEROSCOPY AND STENT PLACEMENT Left 03/30/2020   Procedure: CYSTOSCOPY WITH RETROGRADE PYELOGRAM, URETEROSCOPY, BASKETTING OF STONE AND STENT PLACEMENT;  Surgeon: Alexis Frock, MD;  Location: [facility];  Service: Urology;  Laterality: Left;   EYE SURGERY Bilateral    Cataracts removed   HOLMIUM LASER APPLICATION Right 89/21/1939   Procedure: RIGHT HOLMIUM LASER APPLICATION;  Surgeon: Malka So, MD;  [facility];  Service: Urology;  Laterality: Right;   KNEE ARTHROSCOPY W/ DEBRIDEMENT Right 11/23/2002   and Synovectomy   LUMBAR LAMINECTOMY/DECOMPRESSION MICRODISCECTOMY N/A 07/15/2018   Procedure: Posterior lumbar decompression and fusion L4-5;  Surgeon: Melina Schools, MD;  Location: [place];  Service: Orthopedics;  Laterality: N/A;  4 hrs   NEPHROLITHOTOMY  01/16/2011   Procedure: NEPHROLITHOTOMY PERCUTANEOUS;  Surgeon: Malka So;  Location: [facility];  Service: Urology;  Laterality: Left;  C-Arm  Holmium Laser   NEPHROLITHOTOMY  1981   PACEMAKER IMPLANT N/A 04/26/2021   Procedure: PACEMAKER IMPLANT;  Surgeon: Constance Haw, MD;  Location: [facility];  Service: Cardiovascular;  Laterality: N/A;   SUPRAVENTRICULAR TACHYCARDIA ABLATION N/A 04/28/2014   Procedure: SUPRAVENTRICULAR TACHYCARDIA ABLATION;  Surgeon: Evans Lance, MD;  Location: [facility];  Service: Cardiovascular;  Laterality: N/A;     A IV Location/Drains/Wounds Patient Lines/Drains/Airways Status     Active  Line/Drains/Airways     Name Placement date Placement time Site Days   Peripheral IV 01/31/22 18 G Left Antecubital 01/31/22  0255  Antecubital  less than 1   Peripheral IV 20 G 1" Anterior;Distal;Right;Upper Arm --  --  Arm  --            Intake/Output Last 24 hours  Intake/Output Summary (Last 24 hours) at 01/31/2022 1757 Last data filed at 01/31/2022 1734 Gross per 24 hour  Intake 722.5 ml  Output 950 ml  Net -227.5 ml    Labs/Imaging Results for orders placed or performed during the hospital encounter of 01/31/22 (from the past 48 hour(s))  CBC     Status: Abnormal   Collection Time: 01/31/22  3:30 AM  Result Value Ref Range   WBC 14.4 (H) 4.0 - 10.5 K/uL   RBC 2.82 (L) 4.22 - 5.81 MIL/uL   Hemoglobin 7.1 (L) 13.0 - 17.0 g/dL    Comment: REPEATED TO VERIFY   HCT 22.4 (L) 39.0 - 52.0 %   MCV 79.4 (L) 80.0 - 100.0 fL   MCH 25.2 (L) 26.0 - 34.0 pg   MCHC 31.7 30.0 - 36.0 g/dL  RDW 16.5 (H) 11.5 - 15.5 %   Platelets 179 150 - 400 K/uL   nRBC 0.0 0.0 - 0.2 %    Comment: Performed at [facility], [place] [address] [place], [place] [address]  Comprehensive metabolic panel     Status: Abnormal   Collection Time: 01/31/22  3:30 AM  Result Value Ref Range   Sodium 140 135 - 145 mmol/L   Potassium 3.5 3.5 - 5.1 mmol/L   Chloride 106 98 - 111 mmol/L   CO2 23 22 - 32 mmol/L   Glucose, Bld 247 (H) 70 - 99 mg/dL    Comment: Glucose reference range applies only to samples taken after fasting for at least 8 hours.   BUN 21 8 - 23 mg/dL   Creatinine, Ser 1.74 (H) 0.61 - 1.24 mg/dL   Calcium 8.2 (L) 8.9 - 10.3 mg/dL   Total Protein 5.1 (L) 6.5 - 8.1 g/dL   Albumin 3.3 (L) 3.5 - 5.0 g/dL   AST 21 15 - 41 U/L   ALT 15 0 - 44 U/L   Alkaline Phosphatase 84 38 - 126 U/L   Total Bilirubin 1.7 (H) 0.3 - 1.2 mg/dL   GFR, Estimated 38 (L) >60 mL/min    Comment: (NOTE) Calculated using the CKD-EPI Creatinine Equation (2021)    Anion gap 11 5 - 15    Comment: Performed at  [facility], [place] [address] [place], [place] [address]  Troponin I (High Sensitivity)     Status: Abnormal   Collection Time: 01/31/22  3:30 AM  Result Value Ref Range   Troponin I (High Sensitivity) 25 (H) <18 ng/L    Comment: (NOTE) Elevated high sensitivity troponin I (hsTnI) values and significant  changes across serial measurements may suggest ACS but many other  chronic and acute conditions are known to elevate hsTnI results.  Refer to the "Links" section for chest pain algorithms and additional  guidance. Performed at [facility], [place] [address], [place], [place] [address]   Culture, blood (Routine X 2) w Reflex to ID Panel     Status: None (Preliminary result)   Collection Time: 01/31/22  3:30 AM   Specimen: BLOOD  Result Value Ref Range   Specimen Description BLOOD RIGHT ANTECUBITAL    Special Requests      BOTTLES DRAWN AEROBIC AND ANAEROBIC Blood Culture adequate volume   Culture      NO GROWTH < 12 HOURS Performed at [facility], [place] [address], [place], [place] [address]    Report Status PENDING   Lactic acid, plasma     Status: Abnormal   Collection Time: 01/31/22  3:30 AM  Result Value Ref Range   Lactic Acid, Venous 3.0 (HH) 0.5 - 1.9 mmol/L    Comment: CRITICAL RESULT CALLED TO, READ BACK BY AND VERIFIED WITH Andi Hence RN 01/31/22 0450 Wiliam Ke Performed at [facility], [place] [address] [place], [place] [address]   CK     Status: None   Collection Time: 01/31/22  3:30 AM  Result Value Ref Range   Total CK 124 49 - 397 U/L    Comment: Performed at [facility], [place] [address], [place], [place] [address]  Protime-INR     Status: Abnormal   Collection Time: 01/31/22  3:30 AM  Result Value Ref Range   Prothrombin Time 15.6 (H) 11.4 - 15.2 seconds   INR 1.3 (H) 0.8 - 1.2  Comment: (NOTE) INR goal varies based on device and disease states. Performed at [facility], [place] [address], [place], [place] [address]    Type and screen Roland     Status: None (Preliminary result)   Collection Time: 01/31/22  3:43 AM  Result Value Ref Range   ABO/RH(D) O POS    Antibody Screen NEG    Sample Expiration 02/03/2022,2359    Unit Number [id number]    Blood Component Type RED CELLS,LR    Unit division 00    Status of Unit ISSUED    Transfusion Status OK TO TRANSFUSE    Crossmatch Result      Compatible Performed at [facility], [place] [address] [place], [place] [address]   Culture, blood (Routine X 2) w Reflex to ID Panel     Status: None (Preliminary result)   Collection Time: 01/31/22  3:49 AM   Specimen: BLOOD  Result Value Ref Range   Specimen Description BLOOD LEFT ANTECUBITAL    Special Requests      BOTTLES DRAWN AEROBIC AND ANAEROBIC Blood Culture adequate volume   Culture      NO GROWTH < 12 HOURS Performed at [facility], [place] [address], [place], [place] [address]    Report Status PENDING   Troponin I (High Sensitivity)     Status: Abnormal   Collection Time: 01/31/22  5:38 AM  Result Value Ref Range   Troponin I (High Sensitivity) 39 (H) <18 ng/L    Comment: (NOTE) Elevated high sensitivity troponin I (hsTnI) values and significant  changes across serial measurements may suggest ACS but many other  chronic and acute conditions are known to elevate hsTnI results.  Refer to the "Links" section for chest pain algorithms and additional  guidance. Performed at [facility], [place] [address], [place], [place] [address]   Resp panel by RT-PCR (RSV, Flu A&B, Covid) Anterior Nasal Swab     Status: None   Collection Time: 01/31/22  8:47 AM   Specimen: Anterior Nasal Swab  Result Value Ref Range   SARS Coronavirus 2 by RT PCR NEGATIVE NEGATIVE    Comment: (NOTE) SARS-CoV-2 target nucleic acids are NOT DETECTED.  The SARS-CoV-2 RNA is generally detectable in upper respiratory specimens during the acute phase of infection. The lowest concentration  of SARS-CoV-2 viral copies this assay can detect is 138 copies/mL. A negative result does not preclude SARS-Cov-2 infection and should not be used as the sole basis for treatment or other patient management decisions. A negative result may occur with  improper specimen collection/handling, submission of specimen other than nasopharyngeal swab, presence of viral mutation(s) within the areas targeted by this assay, and inadequate number of viral copies(<138 copies/mL). A negative result must be combined with clinical observations, patient history, and epidemiological information. The expected result is Negative.  Fact Sheet for Patients:  EntrepreneurPulse.com.au  Fact Sheet for Healthcare Providers:  IncredibleEmployment.be  This test is no t yet approved or cleared by the [place] FDA and  has been authorized for detection and/or diagnosis of SARS-CoV-2 by FDA under an Emergency Use Authorization (EUA). This EUA will remain  in effect (meaning this test can be used) for the duration of the COVID-19 declaration under Section 564(b)(1) of the Act, 21 U.S.C.section 360bbb-3(b)(1), unless the authorization is terminated  or revoked sooner.       Influenza A by PCR NEGATIVE NEGATIVE   Influenza B by PCR NEGATIVE  NEGATIVE    Comment: (NOTE) The Xpert Xpress SARS-CoV-2/FLU/RSV plus assay is intended as an aid in the diagnosis of influenza from Nasopharyngeal swab specimens and should not be used as a sole basis for treatment. Nasal washings and aspirates are unacceptable for Xpert Xpress SARS-CoV-2/FLU/RSV testing.  Fact Sheet for Patients: EntrepreneurPulse.com.au  Fact Sheet for Healthcare Providers: IncredibleEmployment.be  This test is not yet approved or cleared by the [place] FDA and has been authorized for detection and/or diagnosis of SARS-CoV-2 by FDA under an Emergency Use Authorization  (EUA). This EUA will remain in effect (meaning this test can be used) for the duration of the COVID-19 declaration under Section 564(b)(1) of the Act, 21 U.S.C. section 360bbb-3(b)(1), unless the authorization is terminated or revoked.     Resp Syncytial Virus by PCR NEGATIVE NEGATIVE    Comment: (NOTE) Fact Sheet for Patients: EntrepreneurPulse.com.au  Fact Sheet for Healthcare Providers: IncredibleEmployment.be  This test is not yet approved or cleared by the [place] FDA and has been authorized for detection and/or diagnosis of SARS-CoV-2 by FDA under an Emergency Use Authorization (EUA). This EUA will remain in effect (meaning this test can be used) for the duration of the COVID-19 declaration under Section 564(b)(1) of the Act, 21 U.S.C. section 360bbb-3(b)(1), unless the authorization is terminated or revoked.  Performed at [facility], [place] [address] [place], [place] [address]   Lactic acid, plasma     Status: None   Collection Time: 01/31/22  9:00 AM  Result Value Ref Range   Lactic Acid, Venous 1.3 0.5 - 1.9 mmol/L    Comment: Performed at [place] [address] [place], [place] [address]  Hemoglobin A1c     Status: Abnormal   Collection Time: 01/31/22  9:00 AM  Result Value Ref Range   Hgb A1c MFr Bld 4.0 (L) 4.8 - 5.6 %    Comment: (NOTE) Pre diabetes:          5.7%-6.4%  Diabetes:              >6.4%  Glycemic control for   <7.0% adults with diabetes    Mean Plasma Glucose 68.1 mg/dL    Comment: Performed at [place] [address], [place], [place] [address]  Prepare RBC (crossmatch)     Status: None   Collection Time: 01/31/22  9:00 AM  Result Value Ref Range   Order Confirmation      ORDER PROCESSED BY BLOOD BANK Performed at [facility], [place] [address], [place], [place] [address]   Urinalysis, Routine w reflex microscopic Urine, Clean Catch     Status: Abnormal    Collection Time: 01/31/22  1:46 PM  Result Value Ref Range   Color, Urine AMBER (A) YELLOW    Comment: BIOCHEMICALS MAY BE AFFECTED BY COLOR   APPearance HAZY (A) CLEAR   Specific Gravity, Urine 1.019 1.005 - 1.030   pH 5.0 5.0 - 8.0   Glucose, UA NEGATIVE NEGATIVE mg/dL   Hgb urine dipstick MODERATE (A) NEGATIVE   Bilirubin Urine NEGATIVE NEGATIVE   Ketones, ur NEGATIVE NEGATIVE mg/dL   Protein, ur 100 (A) NEGATIVE mg/dL   Nitrite NEGATIVE NEGATIVE   Leukocytes,Ua NEGATIVE NEGATIVE   RBC / HPF >50 (H) 0 - 5 RBC/hpf   WBC, UA 0-5 0 - 5 WBC/hpf   Bacteria, UA RARE (A) NONE SEEN   Squamous Epithelial / HPF 0-5 0 - 5 /HPF   Mucus  PRESENT    Hyaline Casts, UA PRESENT     Comment: Performed at [facility], [place] [address], [place], [place] [address]   DG Knee Complete 4 Views Right  Result Date: 01/31/2022 CLINICAL DATA:  Pain, fall with laceration to knee EXAM: RIGHT KNEE - COMPLETE 4 VIEW COMPARISON:  None Available. FINDINGS: No acute fracture or dislocation. Trace joint effusion. Minimal tricompartment osteophytosis. Small suprapatellar enthesophyte. Scattered vascular calcifications. No radiopaque foreign body. IMPRESSION: No acute fracture or dislocation. Electronically Signed   By: Beryle Flock M.D.   On: 01/31/2022 08:35   CT CHEST ABDOMEN PELVIS WO CONTRAST  Result Date: 01/31/2022 CLINICAL DATA:  Fall with head strike EXAM: CT CHEST, ABDOMEN AND PELVIS WITHOUT CONTRAST TECHNIQUE: Multidetector CT imaging of the chest, abdomen and pelvis was performed following the standard protocol without IV contrast. RADIATION DOSE REDUCTION: This exam was performed according to the departmental dose-optimization program which includes automated exposure control, adjustment of the mA and/or kV according to patient size and/or use of iterative reconstruction technique. COMPARISON:  12/17/2018 abdominal CT FINDINGS: CT CHEST FINDINGS Cardiovascular: Cardiomegaly. Dual-chamber pacer from the  left. Atherosclerosis of the aorta and coronaries. No evidence of great vessel injury. Mediastinum/Nodes: No pneumomediastinum or hematoma. Lungs/Pleura: No hemothorax, pneumothorax, or lung contusion. No suspicious nodule or incidental pneumonia generalized bridging thoracic osteophytes. No acute fracture or subluxation. Musculoskeletal: Subcutaneous high-density in the posterior midline consistent with hematoma, overlying the spinous processes of T10 and T11, measuring up to 6 cm in length by 2 cm in anterior to posterior thickness. Spine is diffusely ankylosed from bridging osteophytes except open appearance seen at T1-2, T2-3, T9-10 (with bulky ventral spurring and vacuum phenomenon in the disc space), L3-4, and L5-S1. Posterior-lateral fusion at L4-5. No acute fracture noted.Symmetric gynecomastia. CT ABDOMEN PELVIS FINDINGS Hepatobiliary: No hepatic injury or perihepatic hematoma. Gallbladder is surgically absent Pancreas: Negative Spleen: No splenic injury or perisplenic hematoma. Adrenals/Urinary Tract: No adrenal hemorrhage or renal injury identified. Bladder is unremarkable. Renal lesions bilaterally, the largest measuring cystic density but also some higher density smoothly contoured lesions, proteinaceous/hemorrhagic cysts also seen by comparison CT with no worrisome change. These high-density nodules on both sides measure up to 18 mm. No specific imaging follow-up recommended given the relative stability and demographics. Layering bladder calculi measuring up to 9 mm. Stomach/Bowel: No evidence of injury. Vascular/Lymphatic: Ordinary atheromatous calcification Reproductive: No acute finding Other: No ascites or pneumoperitoneum Musculoskeletal: Lumbar fusion as described above. No acute fracture or subluxation. IMPRESSION: 1. 6 cm subcutaneous hematoma overlying the spinous processes of T10 and T11. 2. No evidence of intrathoracic or intra-abdominal injury. 3. Chronic findings are described above.  Electronically Signed   By: Jorje Guild M.D.   On: 01/31/2022 04:57   CT CERVICAL SPINE WO CONTRAST  Result Date: 01/31/2022 CLINICAL DATA:  Fall with head laceration EXAM: CT HEAD WITHOUT CONTRAST CT CERVICAL SPINE WITHOUT CONTRAST TECHNIQUE: Multidetector CT imaging of the head and cervical spine was performed following the standard protocol without intravenous contrast. Multiplanar CT image reconstructions of the cervical spine were also generated. RADIATION DOSE REDUCTION: This exam was performed according to the departmental dose-optimization program which includes automated exposure control, adjustment of the mA and/or kV according to patient size and/or use of iterative reconstruction technique. COMPARISON:  01/16/2022 FINDINGS: CT HEAD FINDINGS Brain: No evidence of acute infarction, hemorrhage, hydrocephalus, extra-axial collection or mass lesion/mass effect. Generalized cerebral volume loss and confluent chronic small vessel ischemia in the cerebral white matter.  Ventriculomegaly is likely related to the atrophy. Vascular: No hyperdense vessel or unexpected calcification. Skull: No acute fracture. Soft tissue wound with gas to the forehead. Sinuses/Orbits: No visible injury CT CERVICAL SPINE FINDINGS Alignment: No traumatic malalignment Skull base and vertebrae: No acute fracture Soft tissues and spinal canal: No prevertebral fluid or swelling. No visible canal hematoma. Disc levels: Bulky degenerative spurring throughout the cervical spine. Upper chest: No visible injury IMPRESSION: No evidence of acute intracranial or cervical spine injury. Electronically Signed   By: Jorje Guild M.D.   On: 01/31/2022 04:45   CT HEAD WO CONTRAST (5MM)  Result Date: 01/31/2022 CLINICAL DATA:  Fall with head laceration EXAM: CT HEAD WITHOUT CONTRAST CT CERVICAL SPINE WITHOUT CONTRAST TECHNIQUE: Multidetector CT imaging of the head and cervical spine was performed following the standard protocol without  intravenous contrast. Multiplanar CT image reconstructions of the cervical spine were also generated. RADIATION DOSE REDUCTION: This exam was performed according to the departmental dose-optimization program which includes automated exposure control, adjustment of the mA and/or kV according to patient size and/or use of iterative reconstruction technique. COMPARISON:  01/16/2022 FINDINGS: CT HEAD FINDINGS Brain: No evidence of acute infarction, hemorrhage, hydrocephalus, extra-axial collection or mass lesion/mass effect. Generalized cerebral volume loss and confluent chronic small vessel ischemia in the cerebral white matter. Ventriculomegaly is likely related to the atrophy. Vascular: No hyperdense vessel or unexpected calcification. Skull: No acute fracture. Soft tissue wound with gas to the forehead. Sinuses/Orbits: No visible injury CT CERVICAL SPINE FINDINGS Alignment: No traumatic malalignment Skull base and vertebrae: No acute fracture Soft tissues and spinal canal: No prevertebral fluid or swelling. No visible canal hematoma. Disc levels: Bulky degenerative spurring throughout the cervical spine. Upper chest: No visible injury IMPRESSION: No evidence of acute intracranial or cervical spine injury. Electronically Signed   By: Jorje Guild M.D.   On: 01/31/2022 04:45   DG Chest Port 1 View  Result Date: 01/31/2022 CLINICAL DATA:  Fall. EXAM: PORTABLE CHEST 1 VIEW COMPARISON:  01/16/2022. FINDINGS: The heart is enlarged and the mediastinal contour is within normal limits. There is atherosclerotic calcification of the aorta. A dual lead pacemaker is present over the left chest. No consolidation, effusion, or pneumothorax. No acute osseous abnormality. IMPRESSION: No active disease. Electronically Signed   By: Brett Fairy M.D.   On: 01/31/2022 04:12    Pending Labs Unresulted Labs (From admission, onward)     Start     Ordered   02/01/22 0500  CBC  Tomorrow morning,   R        01/31/22 0800    02/01/22 0500  Comprehensive metabolic panel  Tomorrow morning,   R        01/31/22 0800   01/31/22 1400  Hemoglobin and hematocrit, blood  Once-Timed,   TIMED        01/31/22 0842            Vitals/Pain Today's Vitals   01/31/22 1232 01/31/22 1325 01/31/22 1725 01/31/22 1730  BP: 136/69 (!) 139/59  (!) 154/67  Pulse: 84 81  91  Resp:  18    Temp: 97.9 F (36.6 C) 98 F (36.7 C) 98.6 F (37 C)   TempSrc: Oral  Oral   SpO2: 100% 100%  100%  Weight:      Height:      PainSc:        Isolation Precautions Airborne and Contact precautions  Medications Medications  lactated ringers infusion ( Intravenous New Bag/Given  01/31/22 0639)  tamsulosin (FLOMAX) capsule 0.4 mg (0.4 mg Oral Given 01/31/22 1757)  pantoprazole (PROTONIX) EC tablet 40 mg (40 mg Oral Given 01/31/22 1030)  sodium chloride flush (NS) 0.9 % injection 3 mL (3 mLs Intravenous Given 01/31/22 1030)  acetaminophen (TYLENOL) tablet 650 mg (has no administration in time range)    Or  acetaminophen (TYLENOL) suppository 650 mg (has no administration in time range)  albuterol (PROVENTIL) (2.5 MG/3ML) 0.083% nebulizer solution 2.5 mg (has no administration in time range)  0.9 %  sodium chloride infusion (Manually program via Guardrails IV Fluids) ( Intravenous Not Given 01/31/22 1030)  traMADol (ULTRAM) tablet 50 mg (has no administration in time range)  atorvastatin (LIPITOR) tablet 20 mg (20 mg Oral Given 01/31/22 1154)  traZODone (DESYREL) tablet 50 mg (has no administration in time range)  ferrous sulfate tablet 325 mg (has no administration in time range)  diphenhydrAMINE (BENADRYL) capsule 25 mg (has no administration in time range)  sodium chloride 0.9 % bolus 1,000 mL (0 mLs Intravenous Stopped 01/31/22 0544)  lidocaine (PF) (XYLOCAINE) 1 % injection 5 mL (5 mLs Infiltration Given by Other 01/31/22 0545)    Mobility Pt has not walked in the room just stood, but was steady, but has had falls     Focused  Assessments Falls, low hgb, lacerations   R Recommendations: See Admitting Provider Note  Report given to:   Additional Notes: family at bedside, pt has 1 hearing aid with him, but the battery is dead, take pills whole, scrapes, bruises and laceration noted

## 2022-01-31 NOTE — H&P (Addendum)
History and Physical    Patient: Brandon Fischer SLH:[id number] DOB: 08/29/1933 DOA: 01/31/2022 DOS: the patient was seen and examined on 01/31/2022 PCP: Deland Pretty, MD  Patient coming from: Home via EMS  Chief Complaint:  Chief Complaint  Patient presents with   Fall   HPI: Brandon Fischer is a 89 y.o. male with medical history significant of hypertension, hyperlipidemia, nonobstructive CAD, complete heart block s/p PPM, SVT s/p ablation, paroxysmal atrial fibrillation, CKD stage III, and memory loss who presents after having a fall at home.  History is obtained from the patient, but unclear how reliable of historian is as he often yes and then no to things when asked and initially stated that he did not know why he was here.  At baseline patient walks with a walker and states that he lives at home alone and [place] and his daughter [name] comes to check on him.  He had gotten up to use the restroom and states that he was using his walker when he fell.  It was reported that he hit his head on a vase.  He does complain of pain on his right knee at this time.  His daughter states that he had stated that he was trying to pick up his device that is similar to a life alert on the floor that had led to the fall.  It took him several hours to get to his cell phone to call his daughter for help.  Review of records note patient had ED visits on 1/4 and 12/08/2021 related to falls.  In the emergency department patient was noted to be hypothermic with temperature 95.8 F with blood pressures documented as low as 110/41, and all other vital sign is maintained.  Labs significant for WBC 14.4, hemoglobin 7.1, BUN 21, creatinine 1.74, glucose 247 calcium 8.2, albumin 3.3, total bilirubin 1.7, high-sensitivity troponin 25-> 39, lactic acid 3.  CT imaging of the head and cervical spine did not note any acute abnormality.  CT imaging of the chest, abdomen, and pelvis significant for 6 cm subcutaneous hematoma  overlying the spinous process of T10 and T11.  Laceration of the right brow was sutured by the ED provider.  Patient has been bolused 1 L of normal saline IV fluids and then placed on lactated Ringer's 125 ml/h  Blood cultures have been obtained.  Patient has been typed and screened for possible need of blood products and ordered   Review of Systems: As mentioned in the history of present illness. All other systems reviewed and are negative. Past Medical History:  Diagnosis Date   Bilateral carotid artery stenosis without cerebral infarction    ASYMPTOMATIC--  BILATERAL ICA  50-69%  PER CARDIOLOGIST NOTE, DR Einar Gip   Bilateral carotid bruits    LEFT  soft bruit, right no no carotid bruit per 07-24-2014 dr Einar Gip note   CKD (chronic kidney disease)    dr foster [place] kidney, ckd stage 3 per lov 02-16-2019   Complete heart block ([place]) 15/72/6203   Complication of anesthesia    1 episode atrial fib 02-09-2014 [facility] and followed up with cardiology, not further issue   Coronary artery disease    NON-OBSTRUCTIVE CAD  per cath 08-06-2004 HX PALPITATIONS-AND -TACHYCARDIA-   Encounter for care of pacemaker 04/26/2021   First degree heart block    Full dentures    GERD (gastroesophageal reflux disease)    Heart palpitations    Not any more   History of atrial fibrillation  without current medication    EPISODE OF AFIB WITH RVR INTRAOPERATIVELY 02-09-2014 AT [facility]--  RESOLVED AND PT FOLLOWED UP WITH CARDIOLOGIST  DR Einar Gip   History of kidney stones    Hyperlipidemia    Hypertension    Memory loss    Nephrolithiasis    RIGHT   Organic impotence    Pacemaker: [employer] ASSURITY DR-RF - [id number] 04/26/2021   Psoriasis    Clearing up   PSVT (paroxysmal supraventricular tachycardia)    Right ureteral calculus    S/P radiofrequency ablation operation for arrhythmia-SVT 04/28/2014   Simple renal cyst    right   Wears glasses    Wears hearing aid    BILATERAL   Past Surgical  History:  Procedure Laterality Date   CARDIAC CATHETERIZATION  08-06-2004 dr Verlon Setting   (abnormal stress test) Non-obstructive CAD, pLAD 20-30%,  LCX 30-40%, pRCA 20-30%, dRCA 40%, distal LM  mild diffuse calcifation 20-30% tapering stenosis,  preserved LVSF ef 55-60%   CARPAL TUNNEL RELEASE Bilateral 2005   CATARACT EXTRACTION, BILATERAL Bilateral 2011   CHOLECYSTECTOMY     CHOLECYSTECTOMY OPEN  1982   and NEPHROLITHOTOMY   COLONOSCOPY W/ POLYPECTOMY  last one 04-05-2008   CYSTOSCOPY WITH LITHOLAPAXY Right 04/14/2017   Procedure: CYSTOSCOPY WITH LITHOLAPAXY/RIGHT RETROGRADE PLYOGRAM/RIGHT URETEROSCOPY;  Surgeon: [name], MD;  Location: [facility];  Service: Urology;  Laterality: Right;   CYSTOSCOPY WITH RETROGRADE PYELOGRAM, URETEROSCOPY AND STENT PLACEMENT Right 03/14/2014   Procedure: CYSTO/RIGHT RETROGRADE PYELOGRAM/RIGHT URETEROSCOPY/STONE EXTRACTION/STENT PLACEMENT;  Surgeon: Malka So, MD;  Location: [facility];  Service: Urology;  Laterality: Right;   CYSTOSCOPY WITH RETROGRADE PYELOGRAM, URETEROSCOPY AND STENT PLACEMENT Left 04/19/2019   Procedure: CYSTOSCOPY WITH LEFT  RETROGRADE LEFT , URETEROSCOPY WITH HOLMIUM LASER, BASKET EXTRACTION AND STENT PLACEMENT;  Surgeon: [name], MD;  Location: [facility];  Service: Urology;  Laterality: Left;   CYSTOSCOPY WITH RETROGRADE PYELOGRAM, URETEROSCOPY AND STENT PLACEMENT Left 03/30/2020   Procedure: CYSTOSCOPY WITH RETROGRADE PYELOGRAM, URETEROSCOPY, BASKETTING OF STONE AND STENT PLACEMENT;  Surgeon: Alexis Frock, MD;  Location: [facility];  Service: Urology;  Laterality: Left;   EYE SURGERY Bilateral    Cataracts removed   HOLMIUM LASER APPLICATION Right 54/00/8676   Procedure: RIGHT HOLMIUM LASER APPLICATION;  Surgeon: Malka So, MD;  Location: [facility];  Service: Urology;  Laterality: Right;   KNEE ARTHROSCOPY W/ DEBRIDEMENT Right 11/23/2002   and Synovectomy    LUMBAR LAMINECTOMY/DECOMPRESSION MICRODISCECTOMY N/A 07/15/2018   Procedure: Posterior lumbar decompression and fusion L4-5;  Surgeon: Melina Schools, MD;  Location: [facility];  Service: Orthopedics;  Laterality: N/A;  4 hrs   NEPHROLITHOTOMY  01/16/2011   Procedure: NEPHROLITHOTOMY PERCUTANEOUS;  Surgeon: Malka So;  Location: [facility];  Service: Urology;  Laterality: Left;  C-Arm  Holmium Laser   NEPHROLITHOTOMY  1981   PACEMAKER IMPLANT N/A 04/26/2021   Procedure: PACEMAKER IMPLANT;  Surgeon: Constance Haw, MD;  Location: [facility];  Service: Cardiovascular;  Laterality: N/A;   SUPRAVENTRICULAR TACHYCARDIA ABLATION N/A 04/28/2014   Procedure: SUPRAVENTRICULAR TACHYCARDIA ABLATION;  Surgeon: Evans Lance, MD;  Location: [facility];  Service: Cardiovascular;  Laterality: N/A;   Social History:  reports that he quit smoking about 24 years ago. His smoking use included cigarettes. He has a 40.00 pack-year smoking history. He has never used smokeless tobacco. He reports that he does not drink alcohol and does not use drugs.  Allergies  Allergen Reactions   Bupropion     Other reaction(s): disoriented   Olmesartan     Other reaction(s): rash   Trazodone     Other reaction(s): unsteady gait 06/26/21 sometimes takes 1 as needed    Family History  Problem Relation Age of Onset   Cancer Mother        Ovarian or colon   Heart attack Father    Heart attack Brother     Prior to Admission medications   Medication Sig Start Date End Date Taking? Authorizing Provider  Ascorbic Acid (VITAMIN C PO) Take 1 tablet by mouth daily.   Yes [provider]  aspirin (ASPIRIN CHILDRENS) 81 MG chewable tablet Chew 1 tablet (81 mg total) by mouth daily. 07/20/20  Yes Adrian Prows, MD  atorvastatin (LIPITOR) 20 MG tablet Take 20 mg by mouth daily.   Yes [provider]  Cholecalciferol (VITAMIN D-3) 25 MCG (1000 UT) CAPS Take 1,000 Units by mouth daily with breakfast.   Yes  [provider]  diphenhydramine-acetaminophen (TYLENOL PM) 25-500 MG TABS tablet Take 1 tablet by mouth at bedtime as needed (for sleep).   Yes [provider]  Ferrous Sulfate (IRON) 28 MG TABS Take 28 mg by mouth at bedtime.   Yes [provider]  hydrALAZINE (APRESOLINE) 10 MG tablet Take 1 tablet (10 mg total) by mouth 3 (three) times daily. 04/07/21  Yes Blue, Soijett A, PA-C  losartan (COZAAR) 50 MG tablet Take 50 mg by mouth daily. 04/23/20  Yes [provider]  metoprolol succinate (TOPROL-XL) 25 MG 24 hr tablet TAKE 1 TABLET (25 MG TOTAL) BY MOUTH DAILY. 07/15/21 02/01/23 Yes Adrian Prows, MD  pantoprazole (PROTONIX) 40 MG tablet Take 40 mg by mouth in the morning and at bedtime.    Yes [provider]  tamsulosin (FLOMAX) 0.4 MG CAPS capsule Take 0.4 mg by mouth daily after supper.   Yes [provider]  traMADol (ULTRAM) 50 MG tablet Take 1 tablet (50 mg total) by mouth every 6 (six) hours as needed for moderate pain or severe pain. Post-operatively 03/30/20  Yes Alexis Frock, MD  traZODone (DESYREL) 50 MG tablet Take 50-100 mg by mouth daily as needed. 07/13/21  Yes [provider]  zinc gluconate 50 MG tablet Take 50 mg by mouth daily.   Yes [provider]    Physical Exam: Vitals:   01/31/22 0500 01/31/22 0530 01/31/22 0615 01/31/22 0633  BP: (!) 150/62 (!) 110/41 (!) 119/57 (!) 113/49  Pulse: 80 70 72 74  Resp: '18 17 13 15  '$ Temp:    98 F (36.7 C)  TempSrc:    Oral  SpO2: 100% 100% 100% 100%  Weight:      Height:       Exam  Constitutional: Elderly male currently in no acute distress Eyes: PERRL, abrasion above the right eyebrow currently sutured, conjunctivae normal ENMT: Mucous membranes are dry.  Hearing aids in place. Respiratory: clear to auscultation bilaterally, no wheezing, no crackles. Normal respiratory effort. No accessory muscle use.  Cardiovascular: Regular rate and rhythm, no murmurs / rubs  / gallops.  Pacemaker of the upper left chest wall. Abdomen: no tenderness, no masses palpated.  Bowel sounds positive.  Musculoskeletal: no clubbing / cyanosis.  Abrasions and lacerations noted of the right knee without gross deformity.  Decreased range of motion of the right knee due to pain. Skin: Patient has multiple abrasions and lacerations noted of the bilateral arms, right knee,  and toes Neurologic: CN 2-12 grossly intact.   Strength 5/5 in all extremities except for the right leg which is 4/5 thought secondary to pain. Psychiatric: Memory is slightly decreased.  And oriented to person, place, and time.  Initially ported that he did not know why he was here but upon requestioning was able to tell me that it was secondary to a fall.  Data Reviewed:  Reviewed labs, imaging and pertinent records as noted above in HPI.  Assessment and Plan: Hematoma  secondary to fall Frequent falls Patient presents after having a fall at home while going to the bathroom and/or bending over to pick up his life alert like device.  Noted to be on the floor for several hours prior to being able to reach her phone.  At baseline lives alone and ambulates with use of a walker.  Records note that patient has had falls back in November 2023 and earlier this month.  Unclear if patient is safe to be at home alone.  He has some type of life alert device, but was not able to get to it and it seems at the time. CT imaging of the chest, abdomen, and pelvis significant for 6 cm subcutaneous hematoma overlying the spinous process of T10 and T11. -Admit to a telemetry bed -Up with assistance -Check orthostatic vital signs once able -PT/OT to evaluate and treat -Follow-up interrogation of pacemaker -Monitor subcutaneous hematoma  SIRS Lactic acidosis Hypothermia On presentation patient was noted to have a temperature of 95.8 F with WBC elevated at 14.4 and lactic acid initially 3.  Chest x-ray was otherwise noted to be  clear and CT imaging did not note any concern for a infectious process.  Blood cultures have been obtained.  Suspect leukocytosis could be reactive to above. -Follow-up blood culture -Check COVID-19 screening -Check urinalysis -Trend lactic acid level  Acute blood loss anemia Patient presents with hemoglobin down to 7.1, but previously had been 9.4 on 1/4.  He had been found in a pool of blood noted to have multiple abrasions and lacerations on physical exam as the possible.  Patient has been typed and screened for possible need of blood products. -Hold aspirin and resume when medically appropriate -Transfuse 1 unit of packed red blood cells -Recheck H&H posttransfusion  Transient hypotension Blood pressures noted to be temporarily as low as 110/41.  Suspect secondary to acute blood loss.  Home blood pressure medication regimen includes hydralazine 10 mg 3 times daily, losartan 50 mg daily, metoprolol succinate 25 mg daily. -Held home antihypertensive regimen.  Continue to monitor and resume when medically appropriate -Goal MAP at least 65 -Bolus IV fluids as needed  Acute kidney injury superimposed on chronic kidney disease 3A Patient presents with creatinine elevated up to 1.74 with BUN 21.  Baseline creatinine previously noted to be around 1.3.  Question the possibility of rhabdomyolysis as a cause for the acute worsening of kidney function, but CK was within normal limits at 124.  However, suspect symptoms likely related with acute blood loss, but question if patient is retaining given history of BPH.  Patient having given 1 L of normal saline IV fluids and then placed on a rate at 125/hr. -Follow-up urinalysis -Continue IV fluids until patient transfused blood -Consider bladder scanning if patient has not voided. -Continue to monitor kidney function daily  Right right knee pain after fall Patient noted to have significant bruising and abrasions of the right knee.  No gross deformity  appreciated. -Check x-rays  of the right knee -Tramadol  as needed for pain  Hyperglycemia On admission glucose elevated up to 247.  No prior history of diabetes.  Suspect secondary to acute stress response -Check hemoglobin A1c  Dementia Patient has some short-term memory issues. -Delirium precautions -Bed alarm on  Expressive aphasia Patient has issues with speaking where he will initially will say yes when questioning and has some difficulty in expressing what he wants to say.  No reported history of stroke as the cause. -Recommend outpatient follow-up with neurology if  Complete heart block s/p PPM Patient has an Abbott dual-chamber pacemaker and is followed by Dr. Einar Gip of cardiology. -Orders placed to interrogate pacemaker  BPH -Check bladder scan to evaluate for urinary retention -Continue Flomax  GERD -Continue Protonix  DVT prophylaxis: SCDs Advance Care Planning:   Code Status: DNR   Consults: None  Family Communication: Daughter updated over the phone  Severity of Illness: The appropriate patient status for this patient is OBSERVATION. Observation status is judged to be reasonable and necessary in order to provide the required intensity of service to ensure the patient's safety. The patient's presenting symptoms, physical exam findings, and initial radiographic and laboratory data in the context of their medical condition is felt to place them at decreased risk for further clinical deterioration. Furthermore, it is anticipated that the patient will be medically stable for discharge from the hospital within 2 midnights of admission.   Author: Norval Morton, MD 01/31/2022 7:33 AM  For on call review [email or web address].

## 2022-01-31 NOTE — ED Notes (Signed)
Patient left in stable condition, with staff, his son and his belongings.

## 2022-01-31 NOTE — ED Triage Notes (Signed)
Pt to ED for fall with head strike. Pt got up to use bathroom, tripped and hit head on vase. Pt with several lacs. Lac to forehead, bleeding controlled. 500 cc blood loss per EMS. BG 368 per EMS. Pt complains right side hip pain. Denies thinner. AAOx4 122/68 80 20 100%

## 2022-01-31 NOTE — Progress Notes (Signed)
New Admission Note:   Arrival Method: stretcher Mental Orientation: aa+ox4 Telemetry: box 14 Assessment: Completed Skin: 5 sutures noted above right eye brow, multiple scrapes and bruises noted to bil knees, legs and arms, scrape noted to left 2nd toe, large ecchymotic area (15x8 in size, hard to touch and swollen) noted to midback IV: right AC Pain: none Tubes: Safety Measures: Safety Fall Prevention Plan has been given, discussed and signed Admission:  [facility] [place] Orientation: Patient has been orientated to the room, unit and staff.  Family:  Orders have been reviewed and implemented. Will continue to monitor the patient. Call light has been placed within reach and bed alarm has been activated. Dr. Tamala Julian made aware of large ecchymotic area to mid back as mentioned above via page. Awaiting response.  Anastasio Auerbach, RN

## 2022-02-01 ENCOUNTER — Other Ambulatory Visit: Payer: Self-pay

## 2022-02-01 DIAGNOSIS — E876 Hypokalemia: Secondary | ICD-10-CM | POA: Diagnosis present

## 2022-02-01 DIAGNOSIS — N179 Acute kidney failure, unspecified: Secondary | ICD-10-CM | POA: Diagnosis present

## 2022-02-01 DIAGNOSIS — N1832 Chronic kidney disease, stage 3b: Secondary | ICD-10-CM

## 2022-02-01 DIAGNOSIS — S20224A Contusion of middle back wall of thorax, initial encounter: Secondary | ICD-10-CM | POA: Diagnosis present

## 2022-02-01 DIAGNOSIS — E872 Acidosis, unspecified: Secondary | ICD-10-CM | POA: Diagnosis present

## 2022-02-01 DIAGNOSIS — W19XXXA Unspecified fall, initial encounter: Secondary | ICD-10-CM | POA: Diagnosis not present

## 2022-02-01 DIAGNOSIS — I611 Nontraumatic intracerebral hemorrhage in hemisphere, cortical: Secondary | ICD-10-CM | POA: Diagnosis not present

## 2022-02-01 DIAGNOSIS — R4701 Aphasia: Secondary | ICD-10-CM | POA: Diagnosis present

## 2022-02-01 DIAGNOSIS — S0181XA Laceration without foreign body of other part of head, initial encounter: Secondary | ICD-10-CM

## 2022-02-01 DIAGNOSIS — Y92009 Unspecified place in unspecified non-institutional (private) residence as the place of occurrence of the external cause: Secondary | ICD-10-CM

## 2022-02-01 DIAGNOSIS — W1830XA Fall on same level, unspecified, initial encounter: Secondary | ICD-10-CM | POA: Diagnosis present

## 2022-02-01 DIAGNOSIS — N400 Enlarged prostate without lower urinary tract symptoms: Secondary | ICD-10-CM | POA: Diagnosis present

## 2022-02-01 DIAGNOSIS — R680 Hypothermia, not associated with low environmental temperature: Secondary | ICD-10-CM | POA: Diagnosis present

## 2022-02-01 DIAGNOSIS — I2510 Atherosclerotic heart disease of native coronary artery without angina pectoris: Secondary | ICD-10-CM | POA: Diagnosis present

## 2022-02-01 DIAGNOSIS — I10 Essential (primary) hypertension: Secondary | ICD-10-CM | POA: Diagnosis not present

## 2022-02-01 DIAGNOSIS — Z87891 Personal history of nicotine dependence: Secondary | ICD-10-CM | POA: Diagnosis not present

## 2022-02-01 DIAGNOSIS — R6510 Systemic inflammatory response syndrome (SIRS) of non-infectious origin without acute organ dysfunction: Secondary | ICD-10-CM | POA: Diagnosis present

## 2022-02-01 DIAGNOSIS — Z7982 Long term (current) use of aspirin: Secondary | ICD-10-CM | POA: Diagnosis not present

## 2022-02-01 DIAGNOSIS — R509 Fever, unspecified: Secondary | ICD-10-CM | POA: Diagnosis not present

## 2022-02-01 DIAGNOSIS — I129 Hypertensive chronic kidney disease with stage 1 through stage 4 chronic kidney disease, or unspecified chronic kidney disease: Secondary | ICD-10-CM | POA: Diagnosis present

## 2022-02-01 DIAGNOSIS — Z4802 Encounter for removal of sutures: Secondary | ICD-10-CM | POA: Diagnosis not present

## 2022-02-01 DIAGNOSIS — I6782 Cerebral ischemia: Secondary | ICD-10-CM | POA: Diagnosis not present

## 2022-02-01 DIAGNOSIS — E785 Hyperlipidemia, unspecified: Secondary | ICD-10-CM | POA: Diagnosis present

## 2022-02-01 DIAGNOSIS — I480 Paroxysmal atrial fibrillation: Secondary | ICD-10-CM | POA: Diagnosis present

## 2022-02-01 DIAGNOSIS — R269 Unspecified abnormalities of gait and mobility: Secondary | ICD-10-CM | POA: Diagnosis present

## 2022-02-01 DIAGNOSIS — Z79899 Other long term (current) drug therapy: Secondary | ICD-10-CM | POA: Diagnosis not present

## 2022-02-01 DIAGNOSIS — D62 Acute posthemorrhagic anemia: Secondary | ICD-10-CM | POA: Diagnosis present

## 2022-02-01 DIAGNOSIS — R296 Repeated falls: Secondary | ICD-10-CM | POA: Diagnosis present

## 2022-02-01 DIAGNOSIS — R739 Hyperglycemia, unspecified: Secondary | ICD-10-CM | POA: Diagnosis present

## 2022-02-01 DIAGNOSIS — Z1152 Encounter for screening for COVID-19: Secondary | ICD-10-CM | POA: Diagnosis not present

## 2022-02-01 DIAGNOSIS — T68XXXA Hypothermia, initial encounter: Secondary | ICD-10-CM | POA: Diagnosis present

## 2022-02-01 DIAGNOSIS — S0990XA Unspecified injury of head, initial encounter: Secondary | ICD-10-CM | POA: Diagnosis present

## 2022-02-01 DIAGNOSIS — K219 Gastro-esophageal reflux disease without esophagitis: Secondary | ICD-10-CM | POA: Diagnosis present

## 2022-02-01 DIAGNOSIS — Z66 Do not resuscitate: Secondary | ICD-10-CM | POA: Diagnosis present

## 2022-02-01 DIAGNOSIS — N183 Chronic kidney disease, stage 3 unspecified: Secondary | ICD-10-CM | POA: Diagnosis not present

## 2022-02-01 DIAGNOSIS — F0390 Unspecified dementia without behavioral disturbance: Secondary | ICD-10-CM | POA: Diagnosis present

## 2022-02-01 DIAGNOSIS — I442 Atrioventricular block, complete: Secondary | ICD-10-CM | POA: Diagnosis present

## 2022-02-01 LAB — TYPE AND SCREEN
ABO/RH(D): O POS
Antibody Screen: NEGATIVE
Unit division: 0
Unit division: 0

## 2022-02-01 LAB — BPAM RBC
Blood Product Expiration Date: 202402142359
Blood Product Expiration Date: 202402162359
ISSUE DATE / TIME: 202401191011
ISSUE DATE / TIME: 202401192038
Unit Type and Rh: 5100
Unit Type and Rh: 5100

## 2022-02-01 LAB — CBC
HCT: 24 % — ABNORMAL LOW (ref 39.0–52.0)
Hemoglobin: 8 g/dL — ABNORMAL LOW (ref 13.0–17.0)
MCH: 26.8 pg (ref 26.0–34.0)
MCHC: 33.3 g/dL (ref 30.0–36.0)
MCV: 80.5 fL (ref 80.0–100.0)
Platelets: 114 10*3/uL — ABNORMAL LOW (ref 150–400)
RBC: 2.98 MIL/uL — ABNORMAL LOW (ref 4.22–5.81)
RDW: 16.7 % — ABNORMAL HIGH (ref 11.5–15.5)
WBC: 8.2 10*3/uL (ref 4.0–10.5)
nRBC: 0 % (ref 0.0–0.2)

## 2022-02-01 LAB — COMPREHENSIVE METABOLIC PANEL
ALT: 21 U/L (ref 0–44)
AST: 69 U/L — ABNORMAL HIGH (ref 15–41)
Albumin: 2.9 g/dL — ABNORMAL LOW (ref 3.5–5.0)
Alkaline Phosphatase: 74 U/L (ref 38–126)
Anion gap: 6 (ref 5–15)
BUN: 23 mg/dL (ref 8–23)
CO2: 26 mmol/L (ref 22–32)
Calcium: 8.1 mg/dL — ABNORMAL LOW (ref 8.9–10.3)
Chloride: 107 mmol/L (ref 98–111)
Creatinine, Ser: 1.62 mg/dL — ABNORMAL HIGH (ref 0.61–1.24)
GFR, Estimated: 41 mL/min — ABNORMAL LOW (ref >60)
Glucose, Bld: 162 mg/dL — ABNORMAL HIGH (ref 70–99)
Potassium: 3.8 mmol/L (ref 3.5–5.1)
Sodium: 139 mmol/L (ref 135–145)
Total Bilirubin: 1.9 mg/dL — ABNORMAL HIGH (ref 0.3–1.2)
Total Protein: 4.7 g/dL — ABNORMAL LOW (ref 6.5–8.1)

## 2022-02-01 MED ORDER — MELATONIN 3 MG PO TABS
3.0000 mg | ORAL_TABLET | Freq: Every day | ORAL | Status: DC
  Administered 2022-02-01 – 2022-02-05 (×5): 3 mg via ORAL
  Filled 2022-02-01 (×5): qty 1

## 2022-02-01 MED ORDER — SODIUM CHLORIDE 0.9 % IV SOLN: INTRAVENOUS | Status: DC

## 2022-02-01 NOTE — TOC Initial Note (Signed)
Transition of Care [facility]) - Initial/Assessment Note    Patient Details  Name: Brandon Fischer MRN: [id number] Date of Birth: 1933-07-21  [facility] CM/SW Contact:    Bartholomew Crews, RN Phone Number: [phone] 02/01/2022, 11:28 AM  Clinical Narrative:                  Spoke with patient and daughter at the bedside to discuss post acute transition. PT in process of evaluation. Discussed potential transition options. PTA home alone with RW, rollator, and shower stool. Daughter lives a half mile a way. Patient is currently in observation status and is not eligible for SNF per Medicare guidelines that require a 3-day qualifying stay. TOC following for transition needs.   Expected Discharge Plan: [place] Barriers to Discharge: Continued Medical Work up   Patient Goals and CMS Choice Patient states their goals for this hospitalization and ongoing recovery are:: return to his home with family support CMS Medicare.gov Compare Post Acute Care list provided to:: Patient Choice offered to / list presented to : Patient, Adult Children      Expected Discharge Plan and Services   Discharge Planning Services: CM Consult Post Acute Care Choice: [place] arrangements for the past 2 months: Single Family Home                                      Prior Living Arrangements/Services Living arrangements for the past 2 months: Single Family Home Lives with:: Self Patient language and need for interpreter reviewed:: Yes Do you feel safe going back to the place where you live?: Yes      Need for Family Participation in Patient Care: Yes (Comment) Care giver support system in place?: Yes (comment) Current home services: DME (RW, rollator, shower chair) Criminal Activity/Legal Involvement Pertinent to Current Situation/Hospitalization: No - Comment as needed  Activities of Daily Living Home Assistive Devices/Equipment: Dentures (specify type),  Eyeglasses, Hearing aid, Shower chair with back, Walker (specify type) ADL Screening (condition at time of admission) Patient's cognitive ability adequate to safely complete daily activities?: No Is the patient deaf or have difficulty hearing?: Yes Does the patient have difficulty seeing, even when wearing glasses/contacts?: No Does the patient have difficulty concentrating, remembering, or making decisions?: Yes Patient able to express need for assistance with ADLs?: Yes Does the patient have difficulty dressing or bathing?: Yes Independently performs ADLs?: No Communication: Needs assistance (expressive aphasia - answers "yes" to 75% of questions) Dressing (OT): Needs assistance Is this a change from baseline?: Pre-admission baseline Grooming: Dependent Is this a change from baseline?: Pre-admission baseline Feeding: Needs assistance Is this a change from baseline?: Change from baseline, expected to last <3 days Bathing: Needs assistance Is this a change from baseline?: Change from baseline, expected to last >3 days Toileting: Needs assistance Is this a change from baseline?: Change from baseline, expected to last >3days In/Out Bed: Needs assistance Is this a change from baseline?: Change from baseline, expected to last >3 days Walks in Home: Independent with device (comment) Is this a change from baseline?: Pre-admission baseline Does the patient have difficulty walking or climbing stairs?: Yes Weakness of Legs: Both Weakness of Arms/Hands: Both  Permission Sought/Granted Permission sought to share information with : Family Supports          Permission granted to share info w Relationship: daughter  Emotional Assessment Appearance:: Appears stated age Attitude/Demeanor/Rapport: Engaged Affect (typically observed): Accepting Orientation: : Oriented to Self, Oriented to Place, Oriented to  Time, Oriented to Situation Alcohol / Substance Use: Not Applicable Psych  Involvement: No (comment)  Admission diagnosis:  Hypothermia [T68.XXXA] Fall [W19.XXXA] Hypothermia, initial encounter [T68.XXXA] Fall, initial encounter [id number]] Patient Active Problem List   Diagnosis Date Noted   Hypothermia 01/31/2022   Fall 01/31/2022   SIRS (systemic inflammatory response syndrome) (HCC) 01/31/2022   Hematoma 01/31/2022   Lactic acidosis 01/31/2022   Acute blood loss anemia 01/31/2022   Transient hypotension 01/31/2022   Acute renal failure superimposed on stage 3b chronic kidney disease ([place]) 01/31/2022   Right knee pain 01/31/2022   Hyperglycemia 01/31/2022   Memory loss 01/31/2022   BPH (benign prostatic hyperplasia) 01/31/2022   GERD (gastroesophageal reflux disease) 01/31/2022   Pacemaker: [employer] Dual [place] DR-RF - [id number] 04/26/2021 04/26/2021   Encounter for care of pacemaker 04/26/2021   Complete heart block ([place]) 04/25/2021   Spinal stenosis at L4-L5 level 07/15/2018   S/P radiofrequency ablation operation for arrhythmia-SVT 04/28/2014   SVT (supraventricular tachycardia) 03/15/2014   Nephrolithiasis 01/17/2011   Hypertension    Dyslipidemia    Palpitation    PCP:  Deland Pretty, MD Pharmacy:   [facility], [place] - [address] [place] [address] Phone: [phone] Fax: [phone]  [id number] [address] [address]Phone: [phone]: [phone]    Social Determinants of Health (SDOH) Social History: SDOH Screenings   Food Insecurity: No Food Insecurity (02/01/2022)  Housing: Low Risk  (02/01/2022)  Transportation Needs: No Transportation Needs (02/01/2022)  Utilities: Not At Risk (02/01/2022)  Tobacco Use: Medium Risk (01/31/2022)   SDOH Interventions:     Readmission Risk Interventions     No data to display

## 2022-02-01 NOTE — Evaluation (Signed)
Physical Therapy Evaluation Patient Details Name: Brandon Fischer MRN: [id number] DOB: Jan 21, 1933 Today's Date: 02/01/2022  History of Present Illness  Pt is an 89 y.o. male presenting to ED for fall with head strike. CT chest with 6cm subcutaneous hematoma overlying the spinous process of T10-11. PMH significant for HTN, HLD, nonobstructive CAD, complete heart block s/p PPM, SVT s/p ablation, paroxysmal atrial fibrillation, CKD stagd III, and memory loss.  Clinical Impression  Pt admitted with above diagnosis. From home alone, however daughter visits daily to provide meals, and supervise pt during shower. States he was working with with PT until a few months ago when visits were no longer reimbursed by insurance. Family feels he has had a steady decline in function since, including multiple falls at home. Family reports a notable increase in weakness of RLE when walking, often dragging, much worse today compared to pre-admission. Given lack of 24/7 support available, Mr. Champoux would ideally receive SNF level care to improve his gait and reduce fall risk. However, if pt is denied by insurance and family cannot afford SNF - would maximize [facility] services, to lower chance of injury and readmission. Patient is high risk for recurrent falls and required up to mod assist for ambulating today.  Pt currently with functional limitations due to the deficits listed below (see PT Problem List). Pt will benefit from skilled PT to increase their independence and safety with mobility to allow discharge to the venue listed below.       02/01/22 1119  Orthostatic Lying   BP- Lying 134/61  Pulse- Lying 76  Orthostatic Sitting  BP- Sitting 143/71  Pulse- Sitting 84  Orthostatic Standing at 0 minutes  BP- Standing at 0 minutes 138/75  Pulse- Standing at 0 minutes 93          Recommendations for follow up therapy are one component of a multi-disciplinary discharge planning process, led by the attending  physician.  Recommendations may be updated based on patient status, additional functional criteria and insurance authorization.  Follow Up Recommendations Skilled nursing-short term rehab (<3 hours/day) (If pt does not qualify, would maximize [facility] services - discussed with family, increase supervision as much as possible due to high fall risk) Can patient physically be transported by private vehicle: Yes    Assistance Recommended at Discharge Frequent or constant Supervision/Assistance  Patient can return home with the following  A lot of help with walking and/or transfers;A lot of help with bathing/dressing/bathroom;Assistance with cooking/housework;Assist for transportation;Help with stairs or ramp for entrance    Equipment Recommendations Wheelchair (measurements PT);Wheelchair cushion (measurements PT) (* If patient to go home rather than SNF)  Recommendations for Other Services       Functional Status Assessment Patient has had a recent decline in their functional status and demonstrates the ability to make significant improvements in function in a reasonable and predictable amount of time.     Precautions / Restrictions Precautions Precautions: Fall Restrictions Weight Bearing Restrictions: No      Mobility  Bed Mobility Overal bed mobility: Needs Assistance Bed Mobility: Supine to Sit, Sit to Supine     Supine to sit: Min assist, HOB elevated Sit to supine: Min assist   General bed mobility comments: Min assist for trunk and LE support to get to EOB with cues for sequencing. Min assist for LEs back into bed, pt able to control trunk.    Transfers Overall transfer level: Needs assistance Equipment used: Rolling walker (2 wheels) Transfers: Sit to/from Stand Sit  to Stand: Min guard, From elevated surface           General transfer comment: Unable to rise from low bed setting. Elevated to height that daughter reports his bed is at home. Able to stand with min guard  for safety, some instability but improves with UE support on RW.    Ambulation/Gait Ambulation/Gait assistance: Mod assist Gait Distance (Feet): 55 Feet Assistive device: Rolling walker (2 wheels) Gait Pattern/deviations: Step-to pattern, Decreased step length - right, Decreased stance time - right, Decreased step length - left, Decreased stride length, Decreased dorsiflexion - right, Shuffle, Drifts right/left, Trunk flexed Gait velocity: slow Gait velocity interpretation: <1.31 ft/sec, indicative of household ambulator   General Gait Details: Poor control of RLE, worsened with distance. Evaentually required mod assist to advance RLE, RW, and remain balance. VC for sequencing, not clearing Rt foot from floor with steps.  Stairs            Wheelchair Mobility    Modified Rankin (Stroke Patients Only)       Balance Overall balance assessment: Needs assistance Sitting-balance support: No upper extremity supported, Feet supported Sitting balance-Leahy Scale: Fair     Standing balance support: During functional activity, Bilateral upper extremity supported Standing balance-Leahy Scale: Poor                               Pertinent Vitals/Pain Pain Assessment Pain Assessment: No/denies pain    Home Living Family/patient expects to be discharged to:: Private residence Living Arrangements: Alone Available Help at Discharge: Family;Available PRN/intermittently Type of Home: House Home Access: Stairs to enter Entrance Stairs-Rails: Right Entrance Stairs-Number of Steps: 2   Home Layout: One level Home Equipment: Conservation officer, nature (2 wheels);Rollator (4 wheels);Shower seat;Cane - single point      Prior Function Prior Level of Function : Needs assist             Mobility Comments: Able to get in and out of bed, make breakfast and dress himself. Family brings lunch and dinner. ADLs Comments: Has supervision with showering.     Hand Dominance   Dominant  Hand: Right    Extremity/Trunk Assessment   Upper Extremity Assessment Upper Extremity Assessment: Defer to OT evaluation    Lower Extremity Assessment Lower Extremity Assessment: Generalized weakness;RLE deficits/detail RLE Deficits / Details: RLE weaker, family reports baseline but worse at this time. RLE Coordination: decreased gross motor       Communication   Communication: Expressive difficulties;HOH  Cognition Arousal/Alertness: Awake/alert Behavior During Therapy: WFL for tasks assessed/performed Overall Cognitive Status: Difficult to assess                                 General Comments: expressive difficulties at baseline        General Comments General comments (skin integrity, edema, etc.): Family reports worsening of RLE coordination.    Exercises     Assessment/Plan    PT Assessment Patient needs continued PT services  PT Problem List Decreased strength;Decreased activity tolerance;Decreased balance;Decreased mobility;Decreased coordination;Decreased knowledge of use of DME;Decreased safety awareness;Decreased knowledge of precautions;Impaired tone       PT Treatment Interventions DME instruction;Gait training;Stair training;Functional mobility training;Therapeutic activities;Therapeutic exercise;Balance training;Neuromuscular re-education;Patient/family education;Wheelchair mobility training    PT Goals (Current goals can be found in the Care Plan section)  Acute Rehab PT Goals Patient Stated Goal: go home;  Dtr prefers SNF PT Goal Formulation: With patient/family Time For Goal Achievement: 02/14/22 Potential to Achieve Goals: Fair    Frequency Min 4X/week     Co-evaluation               AM-PAC PT "6 Clicks" Mobility  Outcome Measure Help needed turning from your back to your side while in a flat bed without using bedrails?: A Little Help needed moving from lying on your back to sitting on the side of a flat bed without  using bedrails?: A Little Help needed moving to and from a bed to a chair (including a wheelchair)?: A Little Help needed standing up from a chair using your arms (e.g., wheelchair or bedside chair)?: A Little Help needed to walk in hospital room?: A Lot Help needed climbing 3-5 steps with a railing? : Total 6 Click Score: 15    End of Session Equipment Utilized During Treatment: Gait belt Activity Tolerance: Patient tolerated treatment well Patient left: in bed;with call bell/phone within reach;with bed alarm set Nurse Communication: Mobility status PT Visit Diagnosis: Unsteadiness on feet (R26.81);Other abnormalities of gait and mobility (R26.89);Repeated falls (R29.6);Muscle weakness (generalized) (M62.81);History of falling (Z91.81);Difficulty in walking, not elsewhere classified (R26.2);Other symptoms and signs involving the nervous system (R29.898)    Time: 3086-5784 PT Time Calculation (min) (ACUTE ONLY): 37 min   Charges:   PT Evaluation $PT Eval Moderate Complexity: 1 Mod PT Treatments $Gait Training: 8-22 mins        Candie Mile, PT, DPT Physical Therapist [facility] [place]   Ellouise Newer 02/01/2022, 12:42 PM

## 2022-02-01 NOTE — Progress Notes (Addendum)
PROGRESS NOTE    Brandon Fischer  JJH:[id number] DOB: 1933/12/09 DOA: 01/31/2022 PCP: Deland Pretty, MD    Brief Narrative:   Brandon Fischer is a 89 y.o. male with past medical history significant for HTN, HLD, nonobstructive CAD, CHB s/p PPM, SVT s/p ablation, paroxysmal atrial fibrillation not on anticoagulation, CKD stage IIIb, cognitive impairment who presents to [facility] ED on 1/19 from home via EMS following a fall at home.  Fall unwitnessed and likely on the ground for several hours and he was reportedly covered in blood after he sustained a forehead laceration.  Patient reports  He had gotten up to use the restroom and states that he was using his walker when he fell.  It was reported that he hit his head on a vase.  He does complain of pain on his right knee at this time. His daughter states that he had stated that he was trying to pick up his device that is similar to a life alert on the floor that had led to the fall.  It took him several hours to get to his cell phone to call his daughter for help.   At baseline patient walks with a walker and states that he lives at home alone and Brandon Fischer and his daughter Brandon Fischer comes to check on him.  Review of records note patient had ED visits on 1/4 and 12/08/2021 related to falls.  In the emergency department patient was noted to be hypothermic with temperature 95.8 F with blood pressures documented as low as 110/41, and all other vital sign is maintained.  Labs significant for WBC 14.4, hemoglobin 7.1, BUN 21, creatinine 1.74, glucose 247 calcium 8.2, albumin 3.3, total bilirubin 1.7, high-sensitivity troponin 25-> 39, lactic acid 3.  CT imaging of the head and cervical spine did not note any acute abnormality.  CT imaging of the chest, abdomen, and pelvis significant for 6 cm subcutaneous hematoma overlying the spinous process of T10 and T11.  Laceration of the right brow was sutured by the ED provider.  Patient has been bolused 1 L of normal saline  IV fluids and then placed on lactated Ringer's 125 ml/h  Blood cultures have been obtained.  Patient has been typed and screened for possible need of blood products and ordered.  TRH consulted for admission and further evaluation management.  Assessment & Plan:   Hematoma  secondary to fall Frequent falls Patient presents after having a fall at home while going to the bathroom and/or bending over to pick up his life alert like device.  Noted to be on the floor for several hours prior to being able to reach her phone.  At baseline lives alone and ambulates with use of a walker.  Records note that patient has had falls back in November 2023 and earlier this month.  Unclear if patient is safe to be at home alone.  He has some type of life alert device, but was not able to get to it and it seems at the time. CT imaging of the chest, abdomen, and pelvis significant for 6 cm subcutaneous hematoma overlying the spinous process of T10 and T11. -- PT/OT evaluation: pending -- Fall precautions   SIRS Lactic acidosis Hypothermia On presentation patient was noted to have a temperature of 95.8 F with WBC elevated at 14.4 and lactic acid initially 3.  Chest x-ray was otherwise noted to be clear and CT imaging did not note any concern for a infectious process.  COVID, RSV, influenza  negative.  Urinalysis unrevealing.  Suspect leukocytosis could be reactive to above. -- WBC 14.4> Labs pending this morning --lactic acid 3.0>1.3 -- Blood cultures x 2: Pending -- CBC daily  Acute blood loss anemia Patient presents with hemoglobin down to 7.1, but previously had been 9.4 on 1/4.  He had been found in a pool of blood noted to have multiple abrasions and lacerations on physical exam as the possible.  Patient has been typed and screened for possible need of blood products. --Hgb 7.1>6.7 --Transfused 1 unit PRBC 1/19 --Continue to hold aspirin  --awaiting repeat CBC this morning; monitor daily  Forehead  laceration From sustained fall at home.  5 simple interrupted sutures placed by EDP on 1/19.  Will need suture removal 5-7 days from initial placement.   Transient hypotension Blood pressures noted to be temporarily as low as 110/41.  Suspect secondary to acute blood loss.  Home blood pressure medication regimen includes hydralazine 10 mg 3 times daily, losartan 50 mg daily, metoprolol succinate 25 mg daily. -- Continue to hold home antihypertensives -- NS at 75 MLS per hour -- Monitor BP closely   Acute kidney injury superimposed on chronic kidney disease 3A Patient presents with creatinine elevated up to 1.74 with BUN 21.  Baseline creatinine previously noted to be around 1.3.  Question the possibility of rhabdomyolysis as a cause for the acute worsening of kidney function, but CK was within normal limits at 124.  However, suspect symptoms likely related with acute blood loss, but question if patient is retaining given history of BPH.   --Cr 1.74> Labs pending this morning --Repeat BMP in the a.m.   Right right knee pain after fall Patient noted to have significant bruising and abrasions of the right knee.  No gross deformity appreciated.  X-ray right knee with no fracture or dislocation. --Tramadol  as needed for pain --Pending PT/OT evaluation   Hyperglycemia On admission glucose elevated up to 247.  No prior history of diabetes.  Suspect secondary to acute stress response.  Hemoglobin A1c 4.0.   Dementia Patient has some short-term memory issues. --Delirium precautions --Get up during the day --Encourage a familiar face to remain present throughout the day --Keep blinds open and lights on during daylight hours --Minimize the use of opioids/benzodiazepines --Melatonin 3 mg p.o. nightly   Expressive aphasia Patient has issues with speaking where he will initially will say yes when questioning and has some difficulty in expressing what he wants to say.  No reported history of stroke  as the cause.  CT head with no acute findings. --outpatient follow-up with neurology    Complete heart block s/p PPM Patient has an Abbott dual-chamber pacemaker and is followed by Dr. Einar Gip of cardiology. --Monitor on telemetry   BPH --Continue Flomax --Bladder scan as needed  HLD: --Atorvastatin 20 mg p.o. daily   GERD -Continue Protonix   DVT prophylaxis: SCDs Start: 01/31/22 0758    Code Status: DNR Family Communication: No family present at bedside this morning  Disposition Plan:  Level of care: Telemetry Medical Status is: Observation The patient remains OBS appropriate and will d/c before 2 midnights.    Consultants:  None  Procedures:  None  Antimicrobials:  None   Subjective: Patient seen examined bedside, resting comfortably.  Lying in bed.  RN present.  No specific complaints this morning other than needing assistance to be pulled up in bed.  Completed transfusion last night, awaiting repeat labs this morning.  Patient is alert and  oriented.  Awaiting PT/OT evaluation.  No other specific complaints or concerns at this time.  Denies headache, no dizziness, no chest pain, no palpitations, no shortness of breath, no fever/chills/night sweats, no nausea/vomiting/diarrhea, no abdominal pain, no focal weakness, no fatigue, no paresthesias.  No acute events overnight per nurse staff.  Objective: Vitals:   01/31/22 2302 02/01/22 0021 02/01/22 0241 02/01/22 0619  BP: (!) 151/63 (!) 137/55 133/74 (!) 156/74  Pulse: 96 95 94 96  Resp: '16  18 16  '$ Temp: 99.3 F (37.4 C) 98.5 F (36.9 C) 99.1 F (37.3 C) 98.8 F (37.1 C)  TempSrc: Oral Oral Oral Oral  SpO2: 96% 98% 90% 94%  Weight:      Height:        Intake/Output Summary (Last 24 hours) at 02/01/2022 1610 Last data filed at 02/01/2022 9604 Gross per 24 hour  Intake 2543.01 ml  Output 570 ml  Net 1973.01 ml   Filed Weights   01/31/22 0308 01/31/22 1839  Weight: 72.6 kg 77.7 kg     Examination:  Physical Exam: GEN: NAD, alert and oriented x 3, chronically ill/elderly in appearance HEENT: PERRL, EOMI, sclera clear, MMM, forehead laceration noted with sutures in place well-approximated, several areas of ecchymosis noted PULM: CTAB w/o wheezes/crackles, normal respiratory effort, on room air CV: RRR w/o M/G/R GI: abd soft, NTND, NABS, no R/G/M MSK: no peripheral edema, moves all extremities independently, multiple areas of ecchymosis in various stages of healing NEURO: CN II-XII intact, no focal deficits, sensation to light touch intact PSYCH: normal mood/affect Integumentary: Forehead laceration noted with sutures in place, several areas noted to his extremities with ecchymosis in various stages of healing, no other concerning rashes/lesions/wounds noted on exposed skin surfaces.      Data Reviewed: I have personally reviewed following labs and imaging studies  CBC: Recent Labs  Lab 01/31/22 0330 01/31/22 1735  WBC 14.4*  --   HGB 7.1* 6.7*  HCT 22.4* 20.2*  MCV 79.4*  --   PLT 179  --    Basic Metabolic Panel: Recent Labs  Lab 01/31/22 0330  NA 140  K 3.5  CL 106  CO2 23  GLUCOSE 247*  BUN 21  CREATININE 1.74*  CALCIUM 8.2*   GFR: Estimated Creatinine Clearance: 32.5 mL/min (A) (by C-G formula based on SCr of 1.74 mg/dL (H)). Liver Function Tests: Recent Labs  Lab 01/31/22 0330  AST 21  ALT 15  ALKPHOS 84  BILITOT 1.7*  PROT 5.1*  ALBUMIN 3.3*   No results for input(s): "LIPASE", "AMYLASE" in the last 168 hours. No results for input(s): "AMMONIA" in the last 168 hours. Coagulation Profile: Recent Labs  Lab 01/31/22 0330  INR 1.3*   Cardiac Enzymes: Recent Labs  Lab 01/31/22 0330  CKTOTAL 124   BNP (last 3 results) No results for input(s): "PROBNP" in the last 8760 hours. HbA1C: Recent Labs    01/31/22 0900  HGBA1C 4.0*   CBG: No results for input(s): "GLUCAP" in the last 168 hours. Lipid Profile: No results for  input(s): "CHOL", "HDL", "LDLCALC", "TRIG", "CHOLHDL", "LDLDIRECT" in the last 72 hours. Thyroid Function Tests: No results for input(s): "TSH", "T4TOTAL", "FREET4", "T3FREE", "THYROIDAB" in the last 72 hours. Anemia Panel: No results for input(s): "VITAMINB12", "FOLATE", "FERRITIN", "TIBC", "IRON", "RETICCTPCT" in the last 72 hours. Sepsis Labs: Recent Labs  Lab 01/31/22 0330 01/31/22 0900  LATICACIDVEN 3.0* 1.3    Recent Results (from the past 240 hour(s))  Culture, blood (Routine X 2)  w Reflex to ID Panel     Status: None (Preliminary result)   Collection Time: 01/31/22  3:30 AM   Specimen: BLOOD  Result Value Ref Range Status   Specimen Description BLOOD RIGHT ANTECUBITAL  Final   Special Requests   Final    BOTTLES DRAWN AEROBIC AND ANAEROBIC Blood Culture adequate volume   Culture   Final    NO GROWTH < 12 HOURS Performed at [facility], [address], [place], [place] [address]    Report Status PENDING  Incomplete  Culture, blood (Routine X 2) w Reflex to ID Panel     Status: None (Preliminary result)   Collection Time: 01/31/22  3:49 AM   Specimen: BLOOD  Result Value Ref Range Status   Specimen Description BLOOD LEFT ANTECUBITAL  Final   Special Requests   Final    BOTTLES DRAWN AEROBIC AND ANAEROBIC Blood Culture adequate volume   Culture   Final    NO GROWTH < 12 HOURS Performed at [facility], [place] [address] [place], [place] [address]    Report Status PENDING  Incomplete  Resp panel by RT-PCR (RSV, Flu A&B, Covid) Anterior Nasal Swab     Status: None   Collection Time: 01/31/22  8:47 AM   Specimen: Anterior Nasal Swab  Result Value Ref Range Status   SARS Coronavirus 2 by RT PCR NEGATIVE NEGATIVE Final    Comment: (NOTE) SARS-CoV-2 target nucleic acids are NOT DETECTED.  The SARS-CoV-2 RNA is generally detectable in upper respiratory specimens during the acute phase of infection. The lowest concentration of SARS-CoV-2 viral copies this assay  can detect is 138 copies/mL. A negative result does not preclude SARS-Cov-2 infection and should not be used as the sole basis for treatment or other patient management decisions. A negative result may occur with  improper specimen collection/handling, submission of specimen other than nasopharyngeal swab, presence of viral mutation(s) within the areas targeted by this assay, and inadequate number of viral copies(<138 copies/mL). A negative result must be combined with clinical observations, patient history, and epidemiological information. The expected result is Negative.  Fact Sheet for Patients:  EntrepreneurPulse.com.au  Fact Sheet for Healthcare Providers:  IncredibleEmployment.be  This test is no t yet approved or cleared by the [place] FDA and  has been authorized for detection and/or diagnosis of SARS-CoV-2 by FDA under an Emergency Use Authorization (EUA). This EUA will remain  in effect (meaning this test can be used) for the duration of the COVID-19 declaration under Section 564(b)(1) of the Act, 21 U.S.C.section 360bbb-3(b)(1), unless the authorization is terminated  or revoked sooner.       Influenza A by PCR NEGATIVE NEGATIVE Final   Influenza B by PCR NEGATIVE NEGATIVE Final    Comment: (NOTE) The Xpert Xpress SARS-CoV-2/FLU/RSV plus assay is intended as an aid in the diagnosis of influenza from Nasopharyngeal swab specimens and should not be used as a sole basis for treatment. Nasal washings and aspirates are unacceptable for Xpert Xpress SARS-CoV-2/FLU/RSV testing.  Fact Sheet for Patients: EntrepreneurPulse.com.au  Fact Sheet for Healthcare Providers: IncredibleEmployment.be  This test is not yet approved or cleared by the [place] FDA and has been authorized for detection and/or diagnosis of SARS-CoV-2 by FDA under an Emergency Use Authorization (EUA). This EUA will  remain in effect (meaning this test can be used) for the duration of the COVID-19 declaration under Section 564(b)(1) of the Act, 21 U.S.C. section 360bbb-3(b)(1), unless the authorization is  terminated or revoked.     Resp Syncytial Virus by PCR NEGATIVE NEGATIVE Final    Comment: (NOTE) Fact Sheet for Patients: EntrepreneurPulse.com.au  Fact Sheet for Healthcare Providers: IncredibleEmployment.be  This test is not yet approved or cleared by the [place] FDA and has been authorized for detection and/or diagnosis of SARS-CoV-2 by FDA under an Emergency Use Authorization (EUA). This EUA will remain in effect (meaning this test can be used) for the duration of the COVID-19 declaration under Section 564(b)(1) of the Act, 21 U.S.C. section 360bbb-3(b)(1), unless the authorization is terminated or revoked.  Performed at [facility], [place] [address], [place], [place] [address]          Radiology Studies: DG Knee Complete 4 Views Right  Result Date: 01/31/2022 CLINICAL DATA:  Pain, fall with laceration to knee EXAM: RIGHT KNEE - COMPLETE 4 VIEW COMPARISON:  None Available. FINDINGS: No acute fracture or dislocation. Trace joint effusion. Minimal tricompartment osteophytosis. Small suprapatellar enthesophyte. Scattered vascular calcifications. No radiopaque foreign body. IMPRESSION: No acute fracture or dislocation. Electronically Signed   By: Beryle Flock M.D.   On: 01/31/2022 08:35   CT CHEST ABDOMEN PELVIS WO CONTRAST  Result Date: 01/31/2022 CLINICAL DATA:  Fall with head strike EXAM: CT CHEST, ABDOMEN AND PELVIS WITHOUT CONTRAST TECHNIQUE: Multidetector CT imaging of the chest, abdomen and pelvis was performed following the standard protocol without IV contrast. RADIATION DOSE REDUCTION: This exam was performed according to the departmental dose-optimization program which includes automated exposure control, adjustment of the mA  and/or kV according to patient size and/or use of iterative reconstruction technique. COMPARISON:  12/17/2018 abdominal CT FINDINGS: CT CHEST FINDINGS Cardiovascular: Cardiomegaly. Dual-chamber pacer from the left. Atherosclerosis of the aorta and coronaries. No evidence of great vessel injury. Mediastinum/Nodes: No pneumomediastinum or hematoma. Lungs/Pleura: No hemothorax, pneumothorax, or lung contusion. No suspicious nodule or incidental pneumonia generalized bridging thoracic osteophytes. No acute fracture or subluxation. Musculoskeletal: Subcutaneous high-density in the posterior midline consistent with hematoma, overlying the spinous processes of T10 and T11, measuring up to 6 cm in length by 2 cm in anterior to posterior thickness. Spine is diffusely ankylosed from bridging osteophytes except open appearance seen at T1-2, T2-3, T9-10 (with bulky ventral spurring and vacuum phenomenon in the disc space), L3-4, and L5-S1. Posterior-lateral fusion at L4-5. No acute fracture noted.Symmetric gynecomastia. CT ABDOMEN PELVIS FINDINGS Hepatobiliary: No hepatic injury or perihepatic hematoma. Gallbladder is surgically absent Pancreas: Negative Spleen: No splenic injury or perisplenic hematoma. Adrenals/Urinary Tract: No adrenal hemorrhage or renal injury identified. Bladder is unremarkable. Renal lesions bilaterally, the largest measuring cystic density but also some higher density smoothly contoured lesions, proteinaceous/hemorrhagic cysts also seen by comparison CT with no worrisome change. These high-density nodules on both sides measure up to 18 mm. No specific imaging follow-up recommended given the relative stability and demographics. Layering bladder calculi measuring up to 9 mm. Stomach/Bowel: No evidence of injury. Vascular/Lymphatic: Ordinary atheromatous calcification Reproductive: No acute finding Other: No ascites or pneumoperitoneum Musculoskeletal: Lumbar fusion as described above. No acute fracture or  subluxation. IMPRESSION: 1. 6 cm subcutaneous hematoma overlying the spinous processes of T10 and T11. 2. No evidence of intrathoracic or intra-abdominal injury. 3. Chronic findings are described above. Electronically Signed   By: Jorje Guild M.D.   On: 01/31/2022 04:57   CT CERVICAL SPINE WO CONTRAST  Result Date: 01/31/2022 CLINICAL DATA:  Fall with head laceration EXAM: CT HEAD WITHOUT CONTRAST CT CERVICAL SPINE WITHOUT CONTRAST TECHNIQUE: Multidetector CT  imaging of the head and cervical spine was performed following the standard protocol without intravenous contrast. Multiplanar CT image reconstructions of the cervical spine were also generated. RADIATION DOSE REDUCTION: This exam was performed according to the departmental dose-optimization program which includes automated exposure control, adjustment of the mA and/or kV according to patient size and/or use of iterative reconstruction technique. COMPARISON:  01/16/2022 FINDINGS: CT HEAD FINDINGS Brain: No evidence of acute infarction, hemorrhage, hydrocephalus, extra-axial collection or mass lesion/mass effect. Generalized cerebral volume loss and confluent chronic small vessel ischemia in the cerebral white matter. Ventriculomegaly is likely related to the atrophy. Vascular: No hyperdense vessel or unexpected calcification. Skull: No acute fracture. Soft tissue wound with gas to the forehead. Sinuses/Orbits: No visible injury CT CERVICAL SPINE FINDINGS Alignment: No traumatic malalignment Skull base and vertebrae: No acute fracture Soft tissues and spinal canal: No prevertebral fluid or swelling. No visible canal hematoma. Disc levels: Bulky degenerative spurring throughout the cervical spine. Upper chest: No visible injury IMPRESSION: No evidence of acute intracranial or cervical spine injury. Electronically Signed   By: Jorje Guild M.D.   On: 01/31/2022 04:45   CT HEAD WO CONTRAST (5MM)  Result Date: 01/31/2022 CLINICAL DATA:  Fall with head  laceration EXAM: CT HEAD WITHOUT CONTRAST CT CERVICAL SPINE WITHOUT CONTRAST TECHNIQUE: Multidetector CT imaging of the head and cervical spine was performed following the standard protocol without intravenous contrast. Multiplanar CT image reconstructions of the cervical spine were also generated. RADIATION DOSE REDUCTION: This exam was performed according to the departmental dose-optimization program which includes automated exposure control, adjustment of the mA and/or kV according to patient size and/or use of iterative reconstruction technique. COMPARISON:  01/16/2022 FINDINGS: CT HEAD FINDINGS Brain: No evidence of acute infarction, hemorrhage, hydrocephalus, extra-axial collection or mass lesion/mass effect. Generalized cerebral volume loss and confluent chronic small vessel ischemia in the cerebral white matter. Ventriculomegaly is likely related to the atrophy. Vascular: No hyperdense vessel or unexpected calcification. Skull: No acute fracture. Soft tissue wound with gas to the forehead. Sinuses/Orbits: No visible injury CT CERVICAL SPINE FINDINGS Alignment: No traumatic malalignment Skull base and vertebrae: No acute fracture Soft tissues and spinal canal: No prevertebral fluid or swelling. No visible canal hematoma. Disc levels: Bulky degenerative spurring throughout the cervical spine. Upper chest: No visible injury IMPRESSION: No evidence of acute intracranial or cervical spine injury. Electronically Signed   By: Jorje Guild M.D.   On: 01/31/2022 04:45   DG Chest Port 1 View  Result Date: 01/31/2022 CLINICAL DATA:  Fall. EXAM: PORTABLE CHEST 1 VIEW COMPARISON:  01/16/2022. FINDINGS: The heart is enlarged and the mediastinal contour is within normal limits. There is atherosclerotic calcification of the aorta. A dual lead pacemaker is present over the left chest. No consolidation, effusion, or pneumothorax. No acute osseous abnormality. IMPRESSION: No active disease. Electronically Signed   By:  Brett Fairy M.D.   On: 01/31/2022 04:12        Scheduled Meds:  sodium chloride   Intravenous Once   atorvastatin  20 mg Oral Daily   ferrous sulfate  325 mg Oral QHS   pantoprazole  40 mg Oral BID   sodium chloride flush  3 mL Intravenous Q12H   tamsulosin  0.4 mg Oral QPC supper   Continuous Infusions:  lactated ringers 125 mL/hr at 02/01/22 0612     LOS: 0 days    Time spent: 56 minutes spent on chart review, discussion with nursing staff, consultants, updating family and interview/physical  exam; more than 50% of that time was spent in counseling and/or coordination of care.    [name] Bhardwaj J [place] DO [facility] Available via Epic secure chat 7am-7pm After these hours, please refer to coverage provider listed on [email or web address] 02/01/2022, 9:24 AM

## 2022-02-01 NOTE — Evaluation (Signed)
Occupational Therapy Evaluation Patient Details Name: Brandon Fischer MRN: [id number] DOB: 07/27/1933 Today's Date: 02/01/2022   History of Present Illness Pt is an 89 y.o. male presenting to ED for fall with head strike. CT chest with 6cm subcutaneous hematoma overlying the spinous process of T10-11. PMH significant for HTN, HLD, nonobstructive CAD, complete heart block s/p PPM, SVT s/p ablation, paroxysmal atrial fibrillation, CKD stagd III, and memory loss.   Clinical Impression   PTA, pt lived alone and daughter assisted with medication management, financial management, bathing, and provided two meals per day. Upon eval, pt presenting with weakness, decreased balance, strength, and activity tolerance. Pt performing basic transfers with up to mod A this session, LB ADL with max A and UB ADL with set-up. Recommending SNF for continued OT services to optimize safety and independence in ADL and IADL as pt will need constant supervision to ensure safety.      Recommendations for follow up therapy are one component of a multi-disciplinary discharge planning process, led by the attending physician.  Recommendations may be updated based on patient status, additional functional criteria and insurance authorization.   Follow Up Recommendations  Skilled nursing-short term rehab (<3 hours/day)     Assistance Recommended at Discharge Frequent or constant Supervision/Assistance  Patient can return home with the following A lot of help with walking and/or transfers;A lot of help with bathing/dressing/bathroom;Assistance with cooking/housework;Direct supervision/assist for financial management;Direct supervision/assist for medications management;Assist for transportation;Help with stairs or ramp for entrance    Functional Status Assessment  Patient has had a recent decline in their functional status and demonstrates the ability to make significant improvements in function in a reasonable and predictable  amount of time.  Equipment Recommendations  BSC/3in1    Recommendations for Other Services       Precautions / Restrictions Precautions Precautions: Fall Restrictions Weight Bearing Restrictions: No      Mobility Bed Mobility Overal bed mobility: Needs Assistance Bed Mobility: Supine to Sit, Sit to Supine     Supine to sit: Min assist, HOB elevated Sit to supine: Min assist   General bed mobility comments: Min assist for trunk and LE support to get to EOB with cues for sequencing. Min assist for LEs back into bed, pt able to control trunk.    Transfers Overall transfer level: Needs assistance Equipment used: Rolling walker (2 wheels) Transfers: Sit to/from Stand Sit to Stand: Min assist           General transfer comment: Min A up from EOB      Balance Overall balance assessment: Needs assistance Sitting-balance support: No upper extremity supported, Feet supported Sitting balance-Leahy Scale: Fair     Standing balance support: During functional activity, Bilateral upper extremity supported Standing balance-Leahy Scale: Poor                             ADL either performed or assessed with clinical judgement   ADL Overall ADL's : Needs assistance/impaired Eating/Feeding: Set up;Sitting   Grooming: Set up;Sitting   Upper Body Bathing: Set up;Sitting   Lower Body Bathing: Minimal assistance;Sit to/from stand   Upper Body Dressing : Set up;Sitting   Lower Body Dressing: Maximal assistance;Sit to/from stand   Toilet Transfer: Minimal assistance;Moderate assistance;Stand-pivot;BSC/3in1;Rolling walker (2 wheels) Toilet Transfer Details (indicate cue type and reason): Stand pivot to [facility]; mod A for inital rise and min A for back to bed. Min A for steps ToiletingRunner, broadcasting/film/video  and Hygiene: Total assistance;Sit to/from stand Toileting - Clothing Manipulation Details (indicate cue type and reason): Needed BUE support for balance. Decreased  awareness of need for BM and incontinence sittinG EOB then reporting he neededc to go. Also incontinence after return to bed     Functional mobility during ADLs: Minimal assistance;Moderate assistance;Rolling walker (2 wheels)       Vision Patient Visual Report: No change from baseline Additional Comments: WFL for basic mobility     Perception     Praxis      Pertinent Vitals/Pain Pain Assessment Pain Assessment: No/denies pain     Hand Dominance Right   Extremity/Trunk Assessment Upper Extremity Assessment Upper Extremity Assessment: Generalized weakness;RUE deficits/detail RUE Deficits / Details: decreased motor planning, strength and coordination. 4-/5   Lower Extremity Assessment Lower Extremity Assessment: RLE deficits/detail RLE Deficits / Details: RLE weaker, family reports baseline but worse at this time. RLE Coordination: decreased gross motor   Cervical / Trunk Assessment Cervical / Trunk Assessment: Normal;Other exceptions (large bruise; hematoma at T10-11 from fall)   Communication Communication Communication: Expressive difficulties;HOH   Cognition Arousal/Alertness: Awake/alert Behavior During Therapy: WFL for tasks assessed/performed Overall Cognitive Status: No family/caregiver present to determine baseline cognitive functioning                                 General Comments: expressive difficulties at baseline, often verbalizes "yes". following basic commands at times, and at other times needing max multimodal cues. Poor anticipation of need to use restroom as bowel incontinence EOB and then after going to [facility] and reporting he was finished, bowel incontinence again after returning to bed.     General Comments  Family reports worsening of RLE coordination.    Exercises     Shoulder Instructions      Home Living Family/patient expects to be discharged to:: Private residence Living Arrangements: Alone Available Help at Discharge:  Family;Available PRN/intermittently Type of Home: House Home Access: Stairs to enter [employer] of Steps: 2 Entrance Stairs-Rails: Right Home Layout: One level     Bathroom Shower/Tub: Occupational psychologist: Handicapped height     Home Equipment: Conservation officer, nature (2 wheels);Rollator (4 wheels);Shower seat;Cane - single point          Prior Functioning/Environment Prior Level of Function : Needs assist             Mobility Comments: Able to get in and out of bed, make breakfast and dress himself. Family brings lunch and dinner. ADLs Comments: Has supervision with showering.        OT Problem List: Decreased strength;Decreased activity tolerance;Impaired balance (sitting and/or standing);Decreased coordination;Decreased cognition;Decreased safety awareness;Decreased knowledge of use of DME or AE      OT Treatment/Interventions: Self-care/ADL training;Therapeutic exercise;DME and/or AE instruction;Cognitive remediation/compensation;Therapeutic activities;Patient/family education;Balance training    OT Goals(Current goals can be found in the care plan section) Acute Rehab OT Goals Patient Stated Goal: go home OT Goal Formulation: With patient Time For Goal Achievement: 02/15/22 Potential to Achieve Goals: Good  OT Frequency: Min 2X/week    Co-evaluation              AM-PAC OT "6 Clicks" Daily Activity     Outcome Measure Help from another person eating meals?: None Help from another person taking care of personal grooming?: A Little Help from another person toileting, which includes using toliet, bedpan, or urinal?: A Lot Help from  another person bathing (including washing, rinsing, drying)?: A Lot Help from another person to put on and taking off regular upper body clothing?: A Little Help from another person to put on and taking off regular lower body clothing?: A Lot 6 Click Score: 16   End of Session Equipment Utilized During  Treatment: Gait belt;Rolling walker (2 wheels) Nurse Communication: Mobility status;Other (comment) (BM)  Activity Tolerance: Patient tolerated treatment well Patient left: in bed;with call bell/phone within reach;with bed alarm set  OT Visit Diagnosis: Muscle weakness (generalized) (M62.81);Unsteadiness on feet (R26.81);Other abnormalities of gait and mobility (R26.89);History of falling (Z91.81);Other symptoms and signs involving cognitive function;Cognitive communication deficit (R41.841)                Time: 3086-5784 OT Time Calculation (min): 49 min Charges:  OT General Charges $OT Visit: 1 Visit OT Evaluation $OT Eval Moderate Complexity: 1 Mod OT Treatments $Self Care/Home Management : 23-37 mins  Elder Cyphers, OTR/L [facility] Acute Rehabilitation Office: [phone]   Magnus Ivan 02/01/2022, 2:36 PM

## 2022-02-01 NOTE — Care Management Obs Status (Signed)
[place] NOTIFICATION   Patient Details  Name: Brandon Fischer MRN: [id number] Date of Birth: 1933/07/29   Medicare Observation Status Notification Given:  Yes    Bartholomew Crews, RN 02/01/2022, 11:26 AM

## 2022-02-01 NOTE — Progress Notes (Signed)
  Transition of Care [facility]) Screening Note   Patient Details  Name: Brandon Fischer Date of Birth: Nov 26, 1933   Transition of Care [facility]) CM/SW Contact:    Bartholomew Crews, RN Phone Number: [phone] 02/01/2022, 8:20 AM    Transition of Care Department [facility]) has reviewed patient and no TOC needs have been identified at this time. We will continue to monitor patient advancement through interdisciplinary progression rounds. If new patient transition needs arise, please place a TOC consult.

## 2022-02-02 DIAGNOSIS — S0181XA Laceration without foreign body of other part of head, initial encounter: Secondary | ICD-10-CM | POA: Diagnosis not present

## 2022-02-02 DIAGNOSIS — N179 Acute kidney failure, unspecified: Secondary | ICD-10-CM | POA: Diagnosis not present

## 2022-02-02 DIAGNOSIS — N1832 Chronic kidney disease, stage 3b: Secondary | ICD-10-CM | POA: Diagnosis not present

## 2022-02-02 LAB — BASIC METABOLIC PANEL
Anion gap: 9 (ref 5–15)
BUN: 18 mg/dL (ref 8–23)
CO2: 22 mmol/L (ref 22–32)
Calcium: 8 mg/dL — ABNORMAL LOW (ref 8.9–10.3)
Chloride: 107 mmol/L (ref 98–111)
Creatinine, Ser: 1.35 mg/dL — ABNORMAL HIGH (ref 0.61–1.24)
GFR, Estimated: 51 mL/min — ABNORMAL LOW (ref >60)
Glucose, Bld: 107 mg/dL — ABNORMAL HIGH (ref 70–99)
Potassium: 3.6 mmol/L (ref 3.5–5.1)
Sodium: 138 mmol/L (ref 135–145)

## 2022-02-02 LAB — CBC
HCT: 23.4 % — ABNORMAL LOW (ref 39.0–52.0)
Hemoglobin: 7.6 g/dL — ABNORMAL LOW (ref 13.0–17.0)
MCH: 27 pg (ref 26.0–34.0)
MCHC: 32.5 g/dL (ref 30.0–36.0)
MCV: 83.3 fL (ref 80.0–100.0)
Platelets: 114 10*3/uL — ABNORMAL LOW (ref 150–400)
RBC: 2.81 MIL/uL — ABNORMAL LOW (ref 4.22–5.81)
RDW: 17.2 % — ABNORMAL HIGH (ref 11.5–15.5)
WBC: 6.6 10*3/uL (ref 4.0–10.5)
nRBC: 0 % (ref 0.0–0.2)

## 2022-02-02 LAB — MAGNESIUM: Magnesium: 1.4 mg/dL — ABNORMAL LOW (ref 1.7–2.4)

## 2022-02-02 MED ORDER — MAGNESIUM SULFATE 4 GM/100ML IV SOLN
4.0000 g | Freq: Once | INTRAVENOUS | Status: AC
  Administered 2022-02-02: 4 g via INTRAVENOUS
  Filled 2022-02-02: qty 100

## 2022-02-02 MED ORDER — POTASSIUM CHLORIDE CRYS ER 20 MEQ PO TBCR
40.0000 meq | EXTENDED_RELEASE_TABLET | Freq: Once | ORAL | Status: AC
  Administered 2022-02-02: 40 meq via ORAL
  Filled 2022-02-02: qty 2

## 2022-02-02 MED ORDER — SODIUM CHLORIDE 0.9 % IV SOLN: INTRAVENOUS | Status: DC

## 2022-02-02 MED ORDER — METOPROLOL SUCCINATE ER 25 MG PO TB24
25.0000 mg | ORAL_TABLET | Freq: Every day | ORAL | Status: DC
  Administered 2022-02-02 – 2022-02-06 (×5): 25 mg via ORAL
  Filled 2022-02-02 (×5): qty 1

## 2022-02-02 NOTE — Progress Notes (Signed)
Physical Therapy Treatment Patient Details Name: Brandon Fischer MRN: [id number] DOB: [birth year] Today's Date: 02/02/2022   History of Present Illness Pt is an 89 y.o. male presenting to ED for fall with head strike. CT chest with 6cm subcutaneous hematoma overlying the spinous process of T10-11. PMH significant for HTN, HLD, nonobstructive CAD, complete heart block s/p PPM, SVT s/p ablation, paroxysmal atrial fibrillation, CKD stagd III, and memory loss.    PT Comments    Continuing work on functional mobility and activity tolerance;  Session focused on mobilizing OOB, and continuing to work on gait and balance; Walked with mod assist for balance and R LE advancement; Multimodal cueing for slow, long steps; Agree with post-acute rehab at SNF to maximize independence and safety with mobility and ADLs prior to dc home  Recommendations for follow up therapy are one component of a multi-disciplinary discharge planning process, led by the attending physician.  Recommendations may be updated based on patient status, additional functional criteria and insurance authorization.  Follow Up Recommendations  Skilled nursing-short term rehab (<3 hours/day) Can patient physically be transported by private vehicle: Yes   Assistance Recommended at Discharge Frequent or constant Supervision/Assistance  Patient can return home with the following A lot of help with walking and/or transfers;A lot of help with bathing/dressing/bathroom;Assistance with cooking/housework;Assist for transportation;Help with stairs or ramp for entrance   Equipment Recommendations  Wheelchair (measurements PT);Wheelchair cushion (measurements PT) (* If patient to go home rather than SNF)    Recommendations for Other Services       Precautions / Restrictions Precautions Precautions: Fall Restrictions Weight Bearing Restrictions: No     Mobility  Bed Mobility Overal bed mobility: Needs Assistance Bed Mobility: Supine to  Sit     Supine to sit: Min assist, HOB elevated     General bed mobility comments: Min assist for trunk and LE support to get to EOB with cues for sequencing.    Transfers Overall transfer level: Needs assistance Equipment used: Rolling walker (2 wheels) Transfers: Sit to/from Stand Sit to Stand: Min assist           General transfer comment: Min A up from EOB    Ambulation/Gait Ambulation/Gait assistance: Mod assist Gait Distance (Feet): 40 Feet Assistive device: Rolling walker (2 wheels) Gait Pattern/deviations: Step-to pattern, Decreased step length - right, Decreased stance time - right, Decreased step length - left, Decreased stride length, Decreased dorsiflexion - right, Shuffle, Drifts right/left, Trunk flexed, Festinating Gait velocity: slow     General Gait Details: Shuffle, almost festinating gait; Poor control of RLE, worsened with distance. Evaentually required mod assist to advance RLE, RW, and remain balance.   Stairs             Wheelchair Mobility    Modified Rankin (Stroke Patients Only)       Balance     Sitting balance-Leahy Scale: Fair       Standing balance-Leahy Scale: Poor                              Cognition Arousal/Alertness: Awake/alert Behavior During Therapy: WFL for tasks assessed/performed Overall Cognitive Status: No family/caregiver present to determine baseline cognitive functioning                                 General Comments: Expressive difficulties; often simply agrees  Exercises      General Comments General comments (skin integrity, edema, etc.): reached for glasses and began to read teh Sunday comics once set up in the chair      Pertinent Vitals/Pain Pain Assessment Pain Assessment: No/denies pain    Home Living                          Prior Function            PT Goals (current goals can now be found in the care plan section) Acute Rehab PT  Goals Patient Stated Goal: did not state, but agreaable to get up PT Goal Formulation: With patient/family Time For Goal Achievement: 02/14/22 Potential to Achieve Goals: Fair Progress towards PT goals: Progressing toward goals (slowly)    Frequency    Min 4X/week      PT Plan Current plan remains appropriate    Co-evaluation              AM-PAC PT "6 Clicks" Mobility   Outcome Measure  Help needed turning from your back to your side while in a flat bed without using bedrails?: A Little Help needed moving from lying on your back to sitting on the side of a flat bed without using bedrails?: A Little Help needed moving to and from a bed to a chair (including a wheelchair)?: A Little Help needed standing up from a chair using your arms (e.g., wheelchair or bedside chair)?: A Little Help needed to walk in hospital room?: A Lot Help needed climbing 3-5 steps with a railing? : Total 6 Click Score: 15    End of Session Equipment Utilized During Treatment: Gait belt Activity Tolerance: Patient tolerated treatment well Patient left: in chair;with call bell/phone within reach;with chair alarm set Nurse Communication: Mobility status PT Visit Diagnosis: Unsteadiness on feet (R26.81);Other abnormalities of gait and mobility (R26.89);Repeated falls (R29.6);Muscle weakness (generalized) (M62.81);History of falling (Z91.81);Difficulty in walking, not elsewhere classified (R26.2);Other symptoms and signs involving the nervous system (R29.898)     Time: 3664-4034 PT Time Calculation (min) (ACUTE ONLY): 23 min  Charges:  $Gait Training: 8-22 mins $Therapeutic Activity: 8-22 mins                     Roney Marion, PT  Acute Rehabilitation Services Office [place] 02/02/2022, 4:45 PM

## 2022-02-02 NOTE — NC FL2 (Signed)
[place] LEVEL OF CARE FORM     IDENTIFICATION  Patient Name: Brandon Fischer Birthdate: March 10, 1933 Sex: male Admission Date (Current Location): 01/31/2022  [facility] and [place] Number:  Herbalist and Address:  [facility] [place] [facility] [place] [address] [place], [place] [address]      Provider Number: [id number]  Attending Physician Name and Address:  [place] Donnamarie Poag, DO  Relative Name and Phone Number:       Current Level of Care: Hospital Recommended Level of Care: [place] Prior Approval Number:    Date Approved/Denied:   PASRR Number: [id number]  Discharge Plan: SNF    Current Diagnoses: Patient Active Problem List   Diagnosis Date Noted   Acute on chronic renal failure ([place]) 02/01/2022   Hypothermia 01/31/2022   Fall 01/31/2022   SIRS (systemic inflammatory response syndrome) ([place]) 01/31/2022   Hematoma 01/31/2022   Lactic acidosis 01/31/2022   Acute blood loss anemia 01/31/2022   Transient hypotension 01/31/2022   Acute renal failure superimposed on stage 3b chronic kidney disease ([place]) 01/31/2022   Right knee pain 01/31/2022   Hyperglycemia 01/31/2022   Memory loss 01/31/2022   BPH (benign prostatic hyperplasia) 01/31/2022   GERD (gastroesophageal reflux disease) 01/31/2022   Pacemaker: Abbott Dual [place] DR-RF - [id number] 04/26/2021 04/26/2021   Encounter for care of pacemaker 04/26/2021   Complete heart block ([place]) 04/25/2021   Spinal stenosis at L4-L5 level 07/15/2018   S/P radiofrequency ablation operation for arrhythmia-SVT 04/28/2014   SVT (supraventricular tachycardia) 03/15/2014   Nephrolithiasis 01/17/2011   Hypertension    Dyslipidemia    Palpitation     Orientation RESPIRATION BLADDER Height & Weight     Self, Time, Situation, Place  Normal Incontinent Weight: 171 lb 4.8 oz (77.7 kg) Height:  '5\' 11"'$  (180.3 cm)  BEHAVIORAL SYMPTOMS/MOOD NEUROLOGICAL BOWEL NUTRITION  STATUS      Incontinent    AMBULATORY STATUS COMMUNICATION OF NEEDS Skin   Limited Assist Non-Verbally Normal                       Personal Care Assistance Level of Assistance  Bathing, Feeding, Dressing Bathing Assistance: Limited assistance Feeding assistance: Limited assistance Dressing Assistance: Limited assistance     Functional Limitations Info  Sight, Hearing, Speech Sight Info: Impaired Hearing Info: Impaired Speech Info: Impaired    SPECIAL CARE FACTORS FREQUENCY  PT (By licensed PT), OT (By licensed OT)                    Contractures Contractures Info: Not present    Additional Factors Info  Code Status Code Status Info: DNR             Current Medications (02/02/2022):  This is the current hospital active medication list Current Facility-Administered Medications  Medication Dose Route Frequency Provider Last Rate Last Admin   0.9 %  sodium chloride infusion (Manually program via Guardrails IV Fluids)   Intravenous Once Tamala Julian, Rondell A, MD       0.9 %  sodium chloride infusion   Intravenous Continuous [place] Eric J, DO 75 mL/hr at 02/02/22 1129 New Bag at 02/02/22 1129   acetaminophen (TYLENOL) tablet 650 mg  650 mg Oral Q6H PRN Fuller Plan A, MD       Or   acetaminophen (TYLENOL) suppository 650 mg  650 mg Rectal Q6H PRN Norval Morton, MD  albuterol (PROVENTIL) (2.5 MG/3ML) 0.083% nebulizer solution 2.5 mg  2.5 mg Nebulization Q6H PRN Tamala Julian, Rondell A, MD       atorvastatin (LIPITOR) tablet 20 mg  20 mg Oral Daily Smith, Rondell A, MD   20 mg at 02/02/22 6644   diphenhydrAMINE (BENADRYL) capsule 25 mg  25 mg Oral QHS PRN Fuller Plan A, MD       ferrous sulfate tablet 325 mg  325 mg Oral QHS Smith, Rondell A, MD   325 mg at 02/01/22 2050   lactated ringers infusion   Intravenous Continuous [place] Eric J, DO   Stopped at 02/01/22 1641   melatonin tablet 3 mg  3 mg Oral QHS [place] Donnamarie Poag, DO   3 mg at 02/01/22 2051    metoprolol succinate (TOPROL-XL) 24 hr tablet 25 mg  25 mg Oral Daily [place] Donnamarie Poag, DO   25 mg at 02/02/22 1127   pantoprazole (PROTONIX) EC tablet 40 mg  40 mg Oral BID Fuller Plan A, MD   40 mg at 02/02/22 0842   sodium chloride flush (NS) 0.9 % injection 3 mL  3 mL Intravenous Q12H Smith, Rondell A, MD   3 mL at 02/01/22 2054   tamsulosin (FLOMAX) capsule 0.4 mg  0.4 mg Oral QPC supper Fuller Plan A, MD   0.4 mg at 02/01/22 1720   traMADol (ULTRAM) tablet 50 mg  50 mg Oral Q6H PRN Fuller Plan A, MD   50 mg at 02/01/22 2052   traZODone (DESYREL) tablet 50 mg  50 mg Oral Daily PRN Norval Morton, MD         Discharge Medications: Please see discharge summary for a list of discharge medications.  Relevant Imaging Results:  Relevant Lab Results:   Additional Information SS# [id number]  Amador Cunas, [place]

## 2022-02-02 NOTE — Progress Notes (Addendum)
PROGRESS NOTE    DRAVIN LANCE  KNL:[id number] DOB: [birth year] DOA: 01/31/2022 PCP: Deland Pretty, MD    Brief Narrative:   Brandon Fischer is a 89 y.o. male with past medical history significant for HTN, HLD, nonobstructive CAD, CHB s/p PPM, SVT s/p ablation, paroxysmal atrial fibrillation not on anticoagulation, CKD stage IIIb, cognitive impairment who presents to [facility] ED on 1/19 from home via EMS following a fall at home.  Fall unwitnessed and likely on the ground for several hours and he was reportedly covered in blood after he sustained a forehead laceration.  Patient reports  He had gotten up to use the restroom and states that he was using his walker when he fell.  It was reported that he hit his head on a vase.  He does complain of pain on his right knee at this time. His daughter states that he had stated that he was trying to pick up his device that is similar to a life alert on the floor that had led to the fall.  It took him several hours to get to his cell phone to call his daughter for help.   At baseline patient walks with a walker and states that he lives at home alone and [place] and his daughter [name] comes to check on him.  Review of records note patient had ED visits on 1/4 and 12/08/2021 related to falls.  In the emergency department patient was noted to be hypothermic with temperature 95.8 F with blood pressures documented as low as 110/41, and all other vital sign is maintained.  Labs significant for WBC 14.4, hemoglobin 7.1, BUN 21, creatinine 1.74, glucose 247 calcium 8.2, albumin 3.3, total bilirubin 1.7, high-sensitivity troponin 25-> 39, lactic acid 3.  CT imaging of the head and cervical spine did not note any acute abnormality.  CT imaging of the chest, abdomen, and pelvis significant for 6 cm subcutaneous hematoma overlying the spinous process of T10 and T11.  Laceration of the right brow was sutured by the ED provider.  Patient has been bolused 1 L of normal saline  IV fluids and then placed on lactated Ringer's 125 ml/h  Blood cultures have been obtained.  Patient has been typed and screened for possible need of blood products and ordered.  TRH consulted for admission and further evaluation management.  Assessment & Plan:   Hematoma  secondary to fall Frequent falls Patient presents after having a fall at home while going to the bathroom and/or bending over to pick up his life alert like device.  Noted to be on the floor for several hours prior to being able to reach her phone.  At baseline lives alone and ambulates with use of a walker.  Records note that patient has had falls back in November 2023 and earlier this month.  Unclear if patient is safe to be at home alone.  He has some type of life alert device, but was not able to get to it and it seems at the time. CT imaging of the chest, abdomen, and pelvis significant for 6 cm subcutaneous hematoma overlying the spinous process of T10 and T11. -- PT/OT evaluation: Recommended SNF --TOC for placement -- Fall precautions   SIRS Lactic acidosis: Resolved Hypothermia: Resolved On presentation patient was noted to have a temperature of 95.8 F with WBC elevated at 14.4 and lactic acid initially 3.  Chest x-ray was otherwise noted to be clear and CT imaging did not note any concern for a  infectious process.  COVID, RSV, influenza negative.  Urinalysis unrevealing.  Suspect leukocytosis could be reactive to above. -- WBC 14.4>>6.6 -- lactic acid 3.0>1.3 -- Blood cultures x 2: No growth x 2 days -- CBC daily  Acute blood loss anemia Patient presents with hemoglobin down to 7.1, but previously had been 9.4 on 1/4.  He had been found in a pool of blood noted to have multiple abrasions and lacerations on physical exam as the possible.  Patient has been typed and screened for possible need of blood products. --Hgb 7.1>6.7>8.0>7.6 --Transfused 1 unit PRBC 1/19 --Continue to hold aspirin  -- Repeat CBC in the  a.m.  Forehead laceration From sustained fall at home.  5 simple interrupted sutures placed by EDP on 1/19.  Will need suture removal 5-7 days from initial placement.   Transient hypotension Blood pressures noted to be temporarily as low as 110/41.  Suspect secondary to acute blood loss.  Home blood pressure medication regimen includes hydralazine 10 mg 3 times daily, losartan 50 mg daily, metoprolol succinate 25 mg daily. -- restart metoprolol succinate 25 mg p.o. daily -- Continue to hold home hydralazine and losartan -- NS at 75 mL/h -- Monitor BP closely  Hypokalemia Hypomagnesemia Will replete. --Repeat electrolytes in a.m.   Acute kidney injury superimposed on chronic kidney disease 3A Patient presents with creatinine elevated up to 1.74 with BUN 21.  Baseline creatinine previously noted to be around 1.3.  Question the possibility of rhabdomyolysis as a cause for the acute worsening of kidney function, but CK was within normal limits at 124.  However, suspect symptoms likely related with acute blood loss, but question if patient is retaining given history of BPH.   -- Cr 1.74>1.62>1.35 -- continue NS at 59m/h -- Repeat BMP in the a.m.   Right right knee pain after fall Patient noted to have significant bruising and abrasions of the right knee.  No gross deformity appreciated.  X-ray right knee with no fracture or dislocation. -- Tramadol  as needed for pain   Hyperglycemia On admission glucose elevated up to 247.  No prior history of diabetes.  Suspect secondary to acute stress response.  Hemoglobin A1c 4.0.   Dementia Patient has some short-term memory issues. --Delirium precautions --Get up during the day --Encourage a familiar face to remain present throughout the day --Keep blinds open and lights on during daylight hours --Minimize the use of opioids/benzodiazepines --Melatonin 3 mg p.o. nightly   Expressive aphasia Patient has issues with speaking where he will  initially will say yes when questioning and has some difficulty in expressing what he wants to say.  No reported history of stroke as the cause.  CT head with no acute findings. --outpatient follow-up with neurology    Complete heart block s/p PPM Patient has an Abbott dual-chamber pacemaker and is followed by Dr. GEinar Gipof cardiology. --Monitor on telemetry   BPH --Continue Flomax --Bladder scan as needed  HLD: --Atorvastatin 20 mg p.o. daily   GERD -Continue Protonix   DVT prophylaxis: SCDs Start: 01/31/22 0758    Code Status: DNR Family Communication: No family present at bedside this morning, updated family members present at bedside yesterday afternoon  Disposition Plan:  Level of care: Telemetry Medical Status is: Inpatient Remains inpatient appropriate because: Pending SNF placement, medically stable for discharge once bed available      Consultants:  None  Procedures:  None  Antimicrobials:  None   Subjective: Patient seen examined bedside, resting comfortably.  Lying in bed.  Just finished eating breakfast.  No complaints this morning.  Denies headache, no dizziness, no chest pain, no palpitations, no shortness of breath, no fever/chills/night sweats, no nausea/vomiting/diarrhea, no abdominal pain, no focal weakness, no fatigue, no paresthesias.  No acute events overnight per nurse staff.  Medically stable for discharge to SNF once bed available  Objective: Vitals:   02/01/22 0940 02/01/22 1736 02/01/22 2008 02/02/22 0422  BP: (!) 135/59 (!) 150/81 (!) 152/67 (!) 146/71  Pulse: 74 88 84 84  Resp: '18 16 18 18  '$ Temp: 98.6 F (37 C) 99 F (37.2 C) 98.8 F (37.1 C) 98.5 F (36.9 C)  TempSrc: Oral Oral Oral Oral  SpO2: 96% 98% 99% 99%  Weight:      Height:        Intake/Output Summary (Last 24 hours) at 02/02/2022 0915 Last data filed at 02/02/2022 0315 Gross per 24 hour  Intake 1759.29 ml  Output 900 ml  Net 859.29 ml   Filed Weights   01/31/22  0308 01/31/22 1839  Weight: 72.6 kg 77.7 kg    Examination:  Physical Exam: GEN: NAD, alert and oriented x 3, chronically ill/elderly in appearance HEENT: PERRL, EOMI, sclera clear, MMM, forehead laceration noted with sutures in place well-approximated, several areas of ecchymosis noted PULM: CTAB w/o wheezes/crackles, normal respiratory effort, on room air CV: RRR w/o M/G/R GI: abd soft, NTND, NABS, no R/G/M MSK: no peripheral edema, moves all extremities independently, multiple areas of ecchymosis in various stages of healing NEURO: CN II-XII intact, no focal deficits, sensation to light touch intact PSYCH: normal mood/affect Integumentary: Forehead laceration noted with sutures in place, several areas noted to his extremities with ecchymosis in various stages of healing, no other concerning rashes/lesions/wounds noted on exposed skin surfaces.      Data Reviewed: I have personally reviewed following labs and imaging studies  CBC: Recent Labs  Lab 01/31/22 0330 01/31/22 1735 02/01/22 0915 02/02/22 0610  WBC 14.4*  --  8.2 6.6  HGB 7.1* 6.7* 8.0* 7.6*  HCT 22.4* 20.2* 24.0* 23.4*  MCV 79.4*  --  80.5 83.3  PLT 179  --  114* 188*   Basic Metabolic Panel: Recent Labs  Lab 01/31/22 0330 02/01/22 0915 02/02/22 0610  NA 140 139 138  K 3.5 3.8 3.6  CL 106 107 107  CO2 '23 26 22  '$ GLUCOSE 247* 162* 107*  BUN '21 23 18  '$ CREATININE 1.74* 1.62* 1.35*  CALCIUM 8.2* 8.1* 8.0*  MG  --   --  1.4*   GFR: Estimated Creatinine Clearance: 41.8 mL/min (A) (by C-G formula based on SCr of 1.35 mg/dL (H)). Liver Function Tests: Recent Labs  Lab 01/31/22 0330 02/01/22 0915  AST 21 69*  ALT 15 21  ALKPHOS 84 74  BILITOT 1.7* 1.9*  PROT 5.1* 4.7*  ALBUMIN 3.3* 2.9*   No results for input(s): "LIPASE", "AMYLASE" in the last 168 hours. No results for input(s): "AMMONIA" in the last 168 hours. Coagulation Profile: Recent Labs  Lab 01/31/22 0330  INR 1.3*   Cardiac  Enzymes: Recent Labs  Lab 01/31/22 0330  CKTOTAL 124   BNP (last 3 results) No results for input(s): "PROBNP" in the last 8760 hours. HbA1C: Recent Labs    01/31/22 0900  HGBA1C 4.0*   CBG: No results for input(s): "GLUCAP" in the last 168 hours. Lipid Profile: No results for input(s): "CHOL", "HDL", "LDLCALC", "TRIG", "CHOLHDL", "LDLDIRECT" in the last 72 hours. Thyroid Function Tests: No  results for input(s): "TSH", "T4TOTAL", "FREET4", "T3FREE", "THYROIDAB" in the last 72 hours. Anemia Panel: No results for input(s): "VITAMINB12", "FOLATE", "FERRITIN", "TIBC", "IRON", "RETICCTPCT" in the last 72 hours. Sepsis Labs: Recent Labs  Lab 01/31/22 0330 01/31/22 0900  LATICACIDVEN 3.0* 1.3    Recent Results (from the past 240 hour(s))  Culture, blood (Routine X 2) w Reflex to ID Panel     Status: None (Preliminary result)   Collection Time: 01/31/22  3:30 AM   Specimen: BLOOD  Result Value Ref Range Status   Specimen Description BLOOD RIGHT ANTECUBITAL  Final   Special Requests   Final    BOTTLES DRAWN AEROBIC AND ANAEROBIC Blood Culture adequate volume   Culture   Final    NO GROWTH 2 DAYS Performed at [facility], [address] [place], [place] [address]    Report Status PENDING  Incomplete  Culture, blood (Routine X 2) w Reflex to ID Panel     Status: None (Preliminary result)   Collection Time: 01/31/22  3:49 AM   Specimen: BLOOD  Result Value Ref Range Status   Specimen Description BLOOD LEFT ANTECUBITAL  Final   Special Requests   Final    BOTTLES DRAWN AEROBIC AND ANAEROBIC Blood Culture adequate volume   Culture   Final    NO GROWTH 2 DAYS Performed at [facility], [place] [address], [place], [place] [address]    Report Status PENDING  Incomplete  Resp panel by RT-PCR (RSV, Flu A&B, Covid) Anterior Nasal Swab     Status: None   Collection Time: 01/31/22  8:47 AM   Specimen: Anterior Nasal Swab  Result Value Ref Range Status   SARS Coronavirus  2 by RT PCR NEGATIVE NEGATIVE Final    Comment: (NOTE) SARS-CoV-2 target nucleic acids are NOT DETECTED.  The SARS-CoV-2 RNA is generally detectable in upper respiratory specimens during the acute phase of infection. The lowest concentration of SARS-CoV-2 viral copies this assay can detect is 138 copies/mL. A negative result does not preclude SARS-Cov-2 infection and should not be used as the sole basis for treatment or other patient management decisions. A negative result may occur with  improper specimen collection/handling, submission of specimen other than nasopharyngeal swab, presence of viral mutation(s) within the areas targeted by this assay, and inadequate number of viral copies(<138 copies/mL). A negative result must be combined with clinical observations, patient history, and epidemiological information. The expected result is Negative.  Fact Sheet for Patients:  EntrepreneurPulse.com.au  Fact Sheet for Healthcare Providers:  IncredibleEmployment.be  This test is no t yet approved or cleared by the [place] FDA and  has been authorized for detection and/or diagnosis of SARS-CoV-2 by FDA under an Emergency Use Authorization (EUA). This EUA will remain  in effect (meaning this test can be used) for the duration of the COVID-19 declaration under Section 564(b)(1) of the Act, 21 U.S.C.section 360bbb-3(b)(1), unless the authorization is terminated  or revoked sooner.       Influenza A by PCR NEGATIVE NEGATIVE Final   Influenza B by PCR NEGATIVE NEGATIVE Final    Comment: (NOTE) The Xpert Xpress SARS-CoV-2/FLU/RSV plus assay is intended as an aid in the diagnosis of influenza from Nasopharyngeal swab specimens and should not be used as a sole basis for treatment. Nasal washings and aspirates are unacceptable for Xpert Xpress SARS-CoV-2/FLU/RSV testing.  Fact Sheet for Patients: EntrepreneurPulse.com.au  Fact  Sheet for Healthcare Providers: IncredibleEmployment.be  This test is not yet approved  or cleared by the [place] and has been authorized for detection and/or diagnosis of SARS-CoV-2 by FDA under an Emergency Use Authorization (EUA). This EUA will remain in effect (meaning this test can be used) for the duration of the COVID-19 declaration under Section 564(b)(1) of the Act, 21 U.S.C. section 360bbb-3(b)(1), unless the authorization is terminated or revoked.     Resp Syncytial Virus by PCR NEGATIVE NEGATIVE Final    Comment: (NOTE) Fact Sheet for Patients: EntrepreneurPulse.com.au  Fact Sheet for Healthcare Providers: IncredibleEmployment.be  This test is not yet approved or cleared by the [place] FDA and has been authorized for detection and/or diagnosis of SARS-CoV-2 by FDA under an Emergency Use Authorization (EUA). This EUA will remain in effect (meaning this test can be used) for the duration of the COVID-19 declaration under Section 564(b)(1) of the Act, 21 U.S.C. section 360bbb-3(b)(1), unless the authorization is terminated or revoked.  Performed at [facility], [place] [address], [place], [place] [address]          Radiology Studies: No results found.      Scheduled Meds:  sodium chloride   Intravenous Once   atorvastatin  20 mg Oral Daily   ferrous sulfate  325 mg Oral QHS   melatonin  3 mg Oral QHS   pantoprazole  40 mg Oral BID   sodium chloride flush  3 mL Intravenous Q12H   tamsulosin  0.4 mg Oral QPC supper   Continuous Infusions:  sodium chloride 75 mL/hr at 02/02/22 0730   lactated ringers Stopped (02/01/22 1641)   magnesium sulfate bolus IVPB 4 g (02/02/22 0855)     LOS: 1 day    Time spent: 48 minutes spent on chart review, discussion with nursing staff, consultants, updating family and interview/physical exam; more than 50% of that time was spent in counseling  and/or coordination of care.    Manami Tutor J [place] DO [facility] Available via Epic secure chat 7am-7pm After these hours, please refer to coverage provider listed on [email or web address] 02/02/2022, 9:15 AM

## 2022-02-02 NOTE — TOC CAGE-AID Note (Signed)
Transition of Care [facility]) - CAGE-AID Screening   Patient Details  Name: Brandon Fischer MRN: [id number] Date of Birth: [birth year]  Transition of Care [facility]) CM/SW Contact:    Precious Bard, RN Phone Number: 02/02/2022, 1:56 PM   Clinical Narrative:  Patient to ED after a fall at home. Patient has baseline cognitive impairment and cannot fully participate in this screening.  CAGE-AID Screening: Substance Abuse Screening unable to be completed due to: : Patient unable to participate

## 2022-02-02 NOTE — Progress Notes (Signed)
Spoke to pt's dtr [name] re PT/OT recommendation for SNF. Reviewed SNF placement process and answered questions. Pt's dtr reports agreeable to SNF and is requesting [facility]. Will begin SNF search and f/u with offers as available.     Wandra Feinstein, MSW, LCSW [phone] (coverage)

## 2022-02-03 DIAGNOSIS — S0181XA Laceration without foreign body of other part of head, initial encounter: Secondary | ICD-10-CM | POA: Diagnosis not present

## 2022-02-03 DIAGNOSIS — N1832 Chronic kidney disease, stage 3b: Secondary | ICD-10-CM | POA: Diagnosis not present

## 2022-02-03 DIAGNOSIS — N179 Acute kidney failure, unspecified: Secondary | ICD-10-CM | POA: Diagnosis not present

## 2022-02-03 LAB — BASIC METABOLIC PANEL WITH GFR
Anion gap: 4 — ABNORMAL LOW (ref 5–15)
BUN: 17 mg/dL (ref 8–23)
CO2: 24 mmol/L (ref 22–32)
Calcium: 7.7 mg/dL — ABNORMAL LOW (ref 8.9–10.3)
Chloride: 109 mmol/L (ref 98–111)
Creatinine, Ser: 1.26 mg/dL — ABNORMAL HIGH (ref 0.61–1.24)
GFR, Estimated: 56 mL/min — ABNORMAL LOW
Glucose, Bld: 109 mg/dL — ABNORMAL HIGH (ref 70–99)
Potassium: 3.9 mmol/L (ref 3.5–5.1)
Sodium: 137 mmol/L (ref 135–145)

## 2022-02-03 LAB — CBC
HCT: 22 % — ABNORMAL LOW (ref 39.0–52.0)
Hemoglobin: 7.2 g/dL — ABNORMAL LOW (ref 13.0–17.0)
MCH: 26.9 pg (ref 26.0–34.0)
MCHC: 32.7 g/dL (ref 30.0–36.0)
MCV: 82.1 fL (ref 80.0–100.0)
Platelets: 125 K/uL — ABNORMAL LOW (ref 150–400)
RBC: 2.68 MIL/uL — ABNORMAL LOW (ref 4.22–5.81)
RDW: 17.2 % — ABNORMAL HIGH (ref 11.5–15.5)
WBC: 5.6 K/uL (ref 4.0–10.5)
nRBC: 0 % (ref 0.0–0.2)

## 2022-02-03 LAB — IRON AND TIBC
Iron: 43 ug/dL — ABNORMAL LOW (ref 45–182)
Saturation Ratios: 24 % (ref 17.9–39.5)
TIBC: 181 ug/dL — ABNORMAL LOW (ref 250–450)
UIBC: 138 ug/dL

## 2022-02-03 LAB — VITAMIN B12: Vitamin B-12: 493 pg/mL (ref 180–914)

## 2022-02-03 LAB — RETICULOCYTES
Immature Retic Fract: 18.1 % — ABNORMAL HIGH (ref 2.3–15.9)
RBC.: 2.64 MIL/uL — ABNORMAL LOW (ref 4.22–5.81)
Retic Count, Absolute: 118.3 K/uL (ref 19.0–186.0)
Retic Ct Pct: 4.5 % — ABNORMAL HIGH (ref 0.4–3.1)

## 2022-02-03 LAB — MAGNESIUM: Magnesium: 2.1 mg/dL (ref 1.7–2.4)

## 2022-02-03 LAB — FOLATE: Folate: 6.6 ng/mL

## 2022-02-03 LAB — FERRITIN: Ferritin: 105 ng/mL (ref 24–336)

## 2022-02-03 NOTE — TOC Progression Note (Signed)
Transition of Care [facility]) - Initial/Assessment Note    Patient Details  Name: Brandon Fischer MRN: [id number] Date of Birth: 1933-03-22  Transition of Care [facility]) CM/SW Contact:    Milinda Antis, LCSWA Phone Number: 02/03/2022, 10:07 AM  Clinical Narrative:                 LCSW contacted admissions at [facility] and spoke with Elyse Hsu the admissions director.  The facility is reviewing the referral and will inform CSW of determination.    TOC following.  Expected Discharge Plan: [place] Barriers to Discharge: Continued Medical Work up   Patient Goals and CMS Choice Patient states their goals for this hospitalization and ongoing recovery are:: return to his home with family support CMS Medicare.gov Compare Post Acute Care list provided to:: Patient Choice offered to / list presented to : Patient, Adult Children      Expected Discharge Plan and Services   Discharge Planning Services: CM Consult Post Acute Care Choice: [place] arrangements for the past 2 months: Single Family Home                                      Prior Living Arrangements/Services Living arrangements for the past 2 months: Single Family Home Lives with:: Self Patient language and need for interpreter reviewed:: Yes Do you feel safe going back to the place where you live?: Yes      Need for Family Participation in Patient Care: Yes (Comment) Care giver support system in place?: Yes (comment) Current home services: DME (RW, rollator, shower chair) Criminal Activity/Legal Involvement Pertinent to Current Situation/Hospitalization: No - Comment as needed  Activities of Daily Living Home Assistive Devices/Equipment: Dentures (specify type), Eyeglasses, Hearing aid, Shower chair with back, Walker (specify type) ADL Screening (condition at time of admission) Patient's cognitive ability adequate to safely complete daily activities?: No Is the patient deaf or  have difficulty hearing?: Yes Does the patient have difficulty seeing, even when wearing glasses/contacts?: No Does the patient have difficulty concentrating, remembering, or making decisions?: Yes Patient able to express need for assistance with ADLs?: Yes Does the patient have difficulty dressing or bathing?: Yes Independently performs ADLs?: No Communication: Needs assistance (expressive aphasia - answers "yes" to 75% of questions) Dressing (OT): Needs assistance Is this a change from baseline?: Pre-admission baseline Grooming: Dependent Is this a change from baseline?: Pre-admission baseline Feeding: Needs assistance Is this a change from baseline?: Change from baseline, expected to last <3 days Bathing: Needs assistance Is this a change from baseline?: Change from baseline, expected to last >3 days Toileting: Needs assistance Is this a change from baseline?: Change from baseline, expected to last >3days In/Out Bed: Needs assistance Is this a change from baseline?: Change from baseline, expected to last >3 days Walks in Home: Independent with device (comment) Is this a change from baseline?: Pre-admission baseline Does the patient have difficulty walking or climbing stairs?: Yes Weakness of Legs: Both Weakness of Arms/Hands: Both  Permission Sought/Granted Permission sought to share information with : Family Supports          Permission granted to share info w Relationship: daughter     Emotional Assessment Appearance:: Appears stated age Attitude/Demeanor/Rapport: Engaged Affect (typically observed): Accepting Orientation: : Oriented to Self, Oriented to Place, Oriented to  Time, Oriented to Situation Alcohol / Substance Use: Not Applicable Psych Involvement:  No (comment)  Admission diagnosis:  Hypothermia [T68.XXXA] Fall [W19.XXXA] Hypothermia, initial encounter [T68.XXXA] Fall, initial encounter [id number]] Acute on chronic renal failure ([place]) [N17.9,  N18.9] Patient Active Problem List   Diagnosis Date Noted   Acute on chronic renal failure ([place]) 02/01/2022   Hypothermia 01/31/2022   Fall 01/31/2022   SIRS (systemic inflammatory response syndrome) ([place]) 01/31/2022   Hematoma 01/31/2022   Lactic acidosis 01/31/2022   Acute blood loss anemia 01/31/2022   Transient hypotension 01/31/2022   Acute renal failure superimposed on stage 3b chronic kidney disease ([place]) 01/31/2022   Right knee pain 01/31/2022   Hyperglycemia 01/31/2022   Memory loss 01/31/2022   BPH (benign prostatic hyperplasia) 01/31/2022   GERD (gastroesophageal reflux disease) 01/31/2022   Pacemaker: Abbott Dual [place] DR-RF - [id number] 04/26/2021 04/26/2021   Encounter for care of pacemaker 04/26/2021   Complete heart block ([place]) 04/25/2021   Spinal stenosis at L4-L5 level 07/15/2018   S/P radiofrequency ablation operation for arrhythmia-SVT 04/28/2014   SVT (supraventricular tachycardia) 03/15/2014   Nephrolithiasis 01/17/2011   Hypertension    Dyslipidemia    Palpitation    PCP:  Deland Pretty, MD Pharmacy:   [facility] - [address] [place] [address] Phone: [phone] Fax: [phone]  [id number] [id number]Phone: [phone]: [phone]    Social Determinants of Health (SDOH) Social History: SDOH Screenings   Food Insecurity: No Food Insecurity (02/01/2022)  Housing: Low Risk  (02/01/2022)  Transportation Needs: No Transportation Needs (02/01/2022)  Utilities: Not At Risk (02/01/2022)  Tobacco Use: Medium Risk (01/31/2022)   SDOH Interventions:     Readmission Risk Interventions     No data to display

## 2022-02-03 NOTE — Progress Notes (Signed)
PROGRESS NOTE    Brandon Fischer  HYQ:[id number] DOB: 1933/10/22 DOA: 01/31/2022 PCP: Deland Pretty, MD    Brief Narrative:   Brandon Fischer is a 89 y.o. male with past medical history significant for HTN, HLD, nonobstructive CAD, CHB s/p PPM, SVT s/p ablation, paroxysmal atrial fibrillation not on anticoagulation, CKD stage IIIb, cognitive impairment who presents to [facility] ED on 1/19 from home via EMS following a fall at home.  Fall unwitnessed and likely on the ground for several hours and he was reportedly covered in blood after he sustained a forehead laceration.  Patient reports  He had gotten up to use the restroom and states that he was using his walker when he fell.  It was reported that he hit his head on a vase.  He does complain of pain on his right knee at this time. His daughter states that he had stated that he was trying to pick up his device that is similar to a life alert on the floor that had led to the fall.  It took him several hours to get to his cell phone to call his daughter for help.   At baseline patient walks with a walker and states that he lives at home alone and [place] and his daughter Brandon Fischer comes to check on him.  Review of records note patient had ED visits on 1/4 and 12/08/2021 related to falls.  In the emergency department patient was noted to be hypothermic with temperature 95.8 F with blood pressures documented as low as 110/41, and all other vital sign is maintained.  Labs significant for WBC 14.4, hemoglobin 7.1, BUN 21, creatinine 1.74, glucose 247 calcium 8.2, albumin 3.3, total bilirubin 1.7, high-sensitivity troponin 25-> 39, lactic acid 3.  CT imaging of the head and cervical spine did not note any acute abnormality.  CT imaging of the chest, abdomen, and pelvis significant for 6 cm subcutaneous hematoma overlying the spinous process of T10 and T11.  Laceration of the right brow was sutured by the ED provider.  Patient has been bolused 1 L of normal saline  IV fluids and then placed on lactated Fischer's 125 ml/h  Blood cultures have been obtained.  Patient has been typed and screened for possible need of blood products and ordered.  TRH consulted for admission and further evaluation management.  Assessment & Plan:   Hematoma  secondary to fall Frequent falls Patient presents after having a fall at home while going to the bathroom and/or bending over to pick up his life alert like device.  Noted to be on the floor for several hours prior to being able to reach her phone.  At baseline lives alone and ambulates with use of a walker.  Records note that patient has had falls back in November 2023 and earlier this month.  Unclear if patient is safe to be at home alone.  He has some type of life alert device, but was not able to get to it and it seems at the time. CT imaging of the chest, abdomen, and pelvis significant for 6 cm subcutaneous hematoma overlying the spinous process of T10 and T11. -- PT/OT evaluation: Recommended SNF --TOC for placement -- Fall precautions   SIRS Lactic acidosis: Resolved Hypothermia: Resolved On presentation patient was noted to have a temperature of 95.8 F with WBC elevated at 14.4 and lactic acid initially 3.  Chest x-ray was otherwise noted to be clear and CT imaging did not note any concern for a  infectious process.  COVID, RSV, influenza negative.  Urinalysis unrevealing.  Suspect leukocytosis could be reactive to above. -- WBC 14.4>>6.6>5.6 -- lactic acid 3.0>1.3 -- Blood cultures x 2: No growth x 3 days -- CBC daily  Acute blood loss anemia Patient presents with hemoglobin down to 7.1, but previously had been 9.4 on 1/4.  He had been found in a pool of blood noted to have multiple abrasions and lacerations on physical exam as the possible.  Patient has been typed and screened for possible need of blood products. -- Hgb 7.1>6.7>8.0>7.6>7.2 -- Transfused 1 unit PRBC 1/19 -- Continue to hold aspirin  -- Repeat CBC  in the a.m.  Forehead laceration From sustained fall at home.  5 simple interrupted sutures placed by EDP on 1/19.  Will need suture removal 5-7 days from initial placement (1/24 - 1/26).   Transient hypotension Blood pressures noted to be temporarily as low as 110/41.  Suspect secondary to acute blood loss.  Home blood pressure medication regimen includes hydralazine 10 mg 3 times daily, losartan 50 mg daily, metoprolol succinate 25 mg daily. -- Metoprolol succinate 25 mg p.o. daily -- Continue to hold home hydralazine and losartan -- Monitor BP closely  Hypokalemia Hypomagnesemia Repleted.   Acute kidney injury superimposed on chronic kidney disease 3A Patient presents with creatinine elevated up to 1.74 with BUN 21.  Baseline creatinine previously noted to be around 1.3.  Question the possibility of rhabdomyolysis as a cause for the acute worsening of kidney function, but CK was within normal limits at 124.  However, suspect symptoms likely related with acute blood loss, but question if patient is retaining given history of BPH.   -- Cr 1.74>1.62>1.35>1.26 -- Discontinue IV fluids today -- Repeat BMP in the a.m.   Right right knee pain after fall Patient noted to have significant bruising and abrasions of the right knee.  No gross deformity appreciated.  X-ray right knee with no fracture or dislocation. -- Tramadol  as needed for pain   Hyperglycemia On admission glucose elevated up to 247.  No prior history of diabetes.  Suspect secondary to acute stress response.  Hemoglobin A1c 4.0.   Dementia Patient has some short-term memory issues. --Delirium precautions --Get up during the day --Encourage a familiar face to remain present throughout the day --Keep blinds open and lights on during daylight hours --Minimize the use of opioids/benzodiazepines --Melatonin 3 mg p.o. nightly   Expressive aphasia Patient has issues with speaking where he will initially will say yes when  questioning and has some difficulty in expressing what he wants to say.  No reported history of stroke as the cause.  CT head with no acute findings. --outpatient follow-up with neurology    Complete heart block s/p PPM Patient has an Abbott dual-chamber pacemaker and is followed by Dr. Einar Gip of cardiology. Outpatient follow-up with cardiology.   BPH --Continue Flomax --Bladder scan as needed  HLD: --Atorvastatin 20 mg p.o. daily   GERD -Continue Protonix   DVT prophylaxis: SCDs Start: 01/31/22 0758    Code Status: DNR Family Communication: No family present at bedside this morning  Disposition Plan:  Level of care: Telemetry Medical Status is: Inpatient Remains inpatient appropriate because: Pending SNF placement, medically stable for discharge once bed available   Consultants:  None  Procedures:  None  Antimicrobials:  None   Subjective: Patient seen examined bedside, resting comfortably.  Lying in bed.  Sleeping but easily arousable.  No specific complaints this morning.  No family present at bedside this morning.   Denies headache, no dizziness, no chest pain, no palpitations, no shortness of breath, no fever/chills/night sweats, no nausea/vomiting/diarrhea, no abdominal pain, no focal weakness, no fatigue, no paresthesias.  No acute events overnight per nurse staff.  Medically stable for discharge to SNF once bed available  Objective: Vitals:   02/02/22 1700 02/02/22 2112 02/03/22 0530 02/03/22 0919  BP: (!) 182/69 (!) 163/89 (!) 153/75 (!) 170/66  Pulse: 72 84 79 73  Resp: '18 16 17 17  '$ Temp: 98.5 F (36.9 C) 98.1 F (36.7 C) 98.1 F (36.7 C) 98.4 F (36.9 C)  TempSrc: Oral Oral Oral   SpO2: 100% 100% 98% 100%  Weight:      Height:        Intake/Output Summary (Last 24 hours) at 02/03/2022 1009 Last data filed at 02/03/2022 0900 Gross per 24 hour  Intake 2158.87 ml  Output 2025 ml  Net 133.87 ml   Filed Weights   01/31/22 0308 01/31/22 1839   Weight: 72.6 kg 77.7 kg    Examination:  Physical Exam: GEN: NAD, alert and oriented x 3, chronically ill/elderly in appearance HEENT: PERRL, EOMI, sclera clear, MMM, forehead laceration noted with sutures in place well-approximated, several areas of ecchymosis noted PULM: CTAB w/o wheezes/crackles, normal respiratory effort, on room air CV: RRR w/o M/G/R GI: abd soft, NTND, NABS, no R/G/M MSK: no peripheral edema, moves all extremities independently, multiple areas of ecchymosis in various stages of healing NEURO: CN II-XII intact, no focal deficits, sensation to light touch intact PSYCH: normal mood/affect Integumentary: Forehead laceration noted with sutures in place, several areas noted to his extremities with ecchymosis in various stages of healing, no other concerning rashes/lesions/wounds noted on exposed skin surfaces.      Data Reviewed: I have personally reviewed following labs and imaging studies  CBC: Recent Labs  Lab 01/31/22 0330 01/31/22 1735 02/01/22 0915 02/02/22 0610 02/03/22 0354  WBC 14.4*  --  8.2 6.6 5.6  HGB 7.1* 6.7* 8.0* 7.6* 7.2*  HCT 22.4* 20.2* 24.0* 23.4* 22.0*  MCV 79.4*  --  80.5 83.3 82.1  PLT 179  --  114* 114* 254*   Basic Metabolic Panel: Recent Labs  Lab 01/31/22 0330 02/01/22 0915 02/02/22 0610 02/03/22 0354  NA 140 139 138 137  K 3.5 3.8 3.6 3.9  CL 106 107 107 109  CO2 '23 26 22 24  '$ GLUCOSE 247* 162* 107* 109*  BUN '21 23 18 17  '$ CREATININE 1.74* 1.62* 1.35* 1.26*  CALCIUM 8.2* 8.1* 8.0* 7.7*  MG  --   --  1.4* 2.1   GFR: Estimated Creatinine Clearance: 44.8 mL/min (A) (by C-G formula based on SCr of 1.26 mg/dL (H)). Liver Function Tests: Recent Labs  Lab 01/31/22 0330 02/01/22 0915  AST 21 69*  ALT 15 21  ALKPHOS 84 74  BILITOT 1.7* 1.9*  PROT 5.1* 4.7*  ALBUMIN 3.3* 2.9*   No results for input(s): "LIPASE", "AMYLASE" in the last 168 hours. No results for input(s): "AMMONIA" in the last 168 hours. Coagulation  Profile: Recent Labs  Lab 01/31/22 0330  INR 1.3*   Cardiac Enzymes: Recent Labs  Lab 01/31/22 0330  CKTOTAL 124   BNP (last 3 results) No results for input(s): "PROBNP" in the last 8760 hours. HbA1C: No results for input(s): "HGBA1C" in the last 72 hours.  CBG: No results for input(s): "GLUCAP" in the last 168 hours. Lipid Profile: No results for input(s): "CHOL", "HDL", "LDLCALC", "  TRIG", "CHOLHDL", "LDLDIRECT" in the last 72 hours. Thyroid Function Tests: No results for input(s): "TSH", "T4TOTAL", "FREET4", "T3FREE", "THYROIDAB" in the last 72 hours. Anemia Panel: Recent Labs    02/02/22 0603 02/03/22 0353  FOLATE  --  6.6  FERRITIN 105  --   TIBC 181*  --   IRON 43*  --   RETICCTPCT  --  4.5*   Sepsis Labs: Recent Labs  Lab 01/31/22 0330 01/31/22 0900  LATICACIDVEN 3.0* 1.3    Recent Results (from the past 240 hour(s))  Culture, blood (Routine X 2) w Reflex to ID Panel     Status: None (Preliminary result)   Collection Time: 01/31/22  3:30 AM   Specimen: BLOOD  Result Value Ref Range Status   Specimen Description BLOOD RIGHT ANTECUBITAL  Final   Special Requests   Final    BOTTLES DRAWN AEROBIC AND ANAEROBIC Blood Culture adequate volume   Culture   Final    NO GROWTH 3 DAYS Performed at [facility], [place] [address] [place], [place] [address]    Report Status PENDING  Incomplete  Culture, blood (Routine X 2) w Reflex to ID Panel     Status: None (Preliminary result)   Collection Time: 01/31/22  3:49 AM   Specimen: BLOOD  Result Value Ref Range Status   Specimen Description BLOOD LEFT ANTECUBITAL  Final   Special Requests   Final    BOTTLES DRAWN AEROBIC AND ANAEROBIC Blood Culture adequate volume   Culture   Final    NO GROWTH 3 DAYS Performed at [facility], [place] [address] [place], [place] [address]    Report Status PENDING  Incomplete  Resp panel by RT-PCR (RSV, Flu A&B, Covid) Anterior Nasal Swab     Status: None   Collection  Time: 01/31/22  8:47 AM   Specimen: Anterior Nasal Swab  Result Value Ref Range Status   SARS Coronavirus 2 by RT PCR NEGATIVE NEGATIVE Final    Comment: (NOTE) SARS-CoV-2 target nucleic acids are NOT DETECTED.  The SARS-CoV-2 RNA is generally detectable in upper respiratory specimens during the acute phase of infection. The lowest concentration of SARS-CoV-2 viral copies this assay can detect is 138 copies/mL. A negative result does not preclude SARS-Cov-2 infection and should not be used as the sole basis for treatment or other patient management decisions. A negative result may occur with  improper specimen collection/handling, submission of specimen other than nasopharyngeal swab, presence of viral mutation(s) within the areas targeted by this assay, and inadequate number of viral copies(<138 copies/mL). A negative result must be combined with clinical observations, patient history, and epidemiological information. The expected result is Negative.  Fact Sheet for Patients:  EntrepreneurPulse.com.au  Fact Sheet for Healthcare Providers:  IncredibleEmployment.be  This test is no t yet approved or cleared by the [place] FDA and  has been authorized for detection and/or diagnosis of SARS-CoV-2 by FDA under an Emergency Use Authorization (EUA). This EUA will remain  in effect (meaning this test can be used) for the duration of the COVID-19 declaration under Section 564(b)(1) of the Act, 21 U.S.C.section 360bbb-3(b)(1), unless the authorization is terminated  or revoked sooner.       Influenza A by PCR NEGATIVE NEGATIVE Final   Influenza B by PCR NEGATIVE NEGATIVE Final    Comment: (NOTE) The Xpert Xpress SARS-CoV-2/FLU/RSV plus assay is intended as an aid in the diagnosis of influenza from Nasopharyngeal swab specimens and should not be used  as a sole basis for treatment. Nasal washings and aspirates are unacceptable for Xpert Xpress  SARS-CoV-2/FLU/RSV testing.  Fact Sheet for Patients: EntrepreneurPulse.com.au  Fact Sheet for Healthcare Providers: IncredibleEmployment.be  This test is not yet approved or cleared by the [place] FDA and has been authorized for detection and/or diagnosis of SARS-CoV-2 by FDA under an Emergency Use Authorization (EUA). This EUA will remain in effect (meaning this test can be used) for the duration of the COVID-19 declaration under Section 564(b)(1) of the Act, 21 U.S.C. section 360bbb-3(b)(1), unless the authorization is terminated or revoked.     Resp Syncytial Virus by PCR NEGATIVE NEGATIVE Final    Comment: (NOTE) Fact Sheet for Patients: EntrepreneurPulse.com.au  Fact Sheet for Healthcare Providers: IncredibleEmployment.be  This test is not yet approved or cleared by the [place] FDA and has been authorized for detection and/or diagnosis of SARS-CoV-2 by FDA under an Emergency Use Authorization (EUA). This EUA will remain in effect (meaning this test can be used) for the duration of the COVID-19 declaration under Section 564(b)(1) of the Act, 21 U.S.C. section 360bbb-3(b)(1), unless the authorization is terminated or revoked.  Performed at [facility], [place] [address] [place], [place] [address]          Radiology Studies: No results found.      Scheduled Meds:  sodium chloride   Intravenous Once   atorvastatin  20 mg Oral Daily   ferrous sulfate  325 mg Oral QHS   melatonin  3 mg Oral QHS   metoprolol succinate  25 mg Oral Daily   pantoprazole  40 mg Oral BID   sodium chloride flush  3 mL Intravenous Q12H   tamsulosin  0.4 mg Oral QPC supper   Continuous Infusions:     LOS: 2 days    Time spent: 48 minutes spent on chart review, discussion with nursing staff, consultants, updating family and interview/physical exam; more than 50% of that time was spent in  counseling and/or coordination of care.    Lief Palmatier J [place] DO [facility] Available via Epic secure chat 7am-7pm After these hours, please refer to coverage provider listed on [email or web address] 02/03/2022, 10:09 AM

## 2022-02-03 NOTE — Plan of Care (Signed)
  Problem: Education: Goal: Knowledge of General Education information will improve Description: Including pain rating scale, medication(s)/side effects and non-pharmacologic comfort measures Outcome: Completed/Met

## 2022-02-03 NOTE — Plan of Care (Signed)
  Problem: Health Behavior/Discharge Planning: Goal: Ability to manage health-related needs will improve Outcome: Progressing   Problem: Clinical Measurements: Goal: Ability to maintain clinical measurements within normal limits will improve Outcome: Progressing   Problem: Activity: Goal: Risk for activity intolerance will decrease Outcome: Progressing   Problem: Safety: Goal: Ability to remain free from injury will improve Outcome: Progressing

## 2022-02-04 DIAGNOSIS — N1832 Chronic kidney disease, stage 3b: Secondary | ICD-10-CM | POA: Diagnosis not present

## 2022-02-04 DIAGNOSIS — D62 Acute posthemorrhagic anemia: Secondary | ICD-10-CM | POA: Diagnosis not present

## 2022-02-04 DIAGNOSIS — N179 Acute kidney failure, unspecified: Secondary | ICD-10-CM | POA: Diagnosis not present

## 2022-02-04 DIAGNOSIS — S0181XA Laceration without foreign body of other part of head, initial encounter: Secondary | ICD-10-CM | POA: Diagnosis not present

## 2022-02-04 LAB — BASIC METABOLIC PANEL
Anion gap: 4 — ABNORMAL LOW (ref 5–15)
BUN: 18 mg/dL (ref 8–23)
CO2: 25 mmol/L (ref 22–32)
Calcium: 7.9 mg/dL — ABNORMAL LOW (ref 8.9–10.3)
Chloride: 107 mmol/L (ref 98–111)
Creatinine, Ser: 1.28 mg/dL — ABNORMAL HIGH (ref 0.61–1.24)
GFR, Estimated: 55 mL/min — ABNORMAL LOW (ref >60)
Glucose, Bld: 85 mg/dL (ref 70–99)
Potassium: 4 mmol/L (ref 3.5–5.1)
Sodium: 136 mmol/L (ref 135–145)

## 2022-02-04 LAB — CBC
HCT: 23.3 % — ABNORMAL LOW (ref 39.0–52.0)
Hemoglobin: 7.6 g/dL — ABNORMAL LOW (ref 13.0–17.0)
MCH: 26.8 pg (ref 26.0–34.0)
MCHC: 32.6 g/dL (ref 30.0–36.0)
MCV: 82 fL (ref 80.0–100.0)
Platelets: 141 10*3/uL — ABNORMAL LOW (ref 150–400)
RBC: 2.84 MIL/uL — ABNORMAL LOW (ref 4.22–5.81)
RDW: 17.3 % — ABNORMAL HIGH (ref 11.5–15.5)
WBC: 5 10*3/uL (ref 4.0–10.5)
nRBC: 0 % (ref 0.0–0.2)

## 2022-02-04 LAB — PREPARE RBC (CROSSMATCH)

## 2022-02-04 LAB — HEMOGLOBIN AND HEMATOCRIT, BLOOD
HCT: 28 % — ABNORMAL LOW (ref 39.0–52.0)
Hemoglobin: 9.1 g/dL — ABNORMAL LOW (ref 13.0–17.0)

## 2022-02-04 MED ORDER — SODIUM CHLORIDE 0.9% IV SOLUTION: Freq: Once | INTRAVENOUS | Status: AC

## 2022-02-04 NOTE — Progress Notes (Signed)
Physical Therapy Treatment Patient Details Name: Brandon Fischer MRN: [id number] DOB: November 06, 1933 Today's Date: 02/04/2022   History of Present Illness Pt is an 89 y.o. male presenting to ED for fall with head strike. CT chest with 6cm subcutaneous hematoma overlying the spinous process of T10-11. PMH significant for HTN, HLD, nonobstructive CAD, complete heart block s/p PPM, SVT s/p ablation, paroxysmal atrial fibrillation, CKD stagd III, and memory loss.    PT Comments    Slow progress, still demonstrating great difficulty coordinating and facilitating RLE with gait. Presenting as a hemiparetic type gait. Family unsure of exactly when this change occurred but feels it was fairly sudden and within the past couple of weeks. Requires Min assist for sit<>stand transfers from chair, leaning posteriorly. Mod assist for gait with balance and RLE advancement. Patient will continue to benefit from skilled physical therapy services to further improve independence with functional mobility.    Recommendations for follow up therapy are one component of a multi-disciplinary discharge planning process, led by the attending physician.  Recommendations may be updated based on patient status, additional functional criteria and insurance authorization.  Follow Up Recommendations  Skilled nursing-short term rehab (<3 hours/day) Can patient physically be transported by private vehicle: Yes   Assistance Recommended at Discharge Frequent or constant Supervision/Assistance  Patient can return home with the following A lot of help with walking and/or transfers;A lot of help with bathing/dressing/bathroom;Assistance with cooking/housework;Assist for transportation;Help with stairs or ramp for entrance   Equipment Recommendations  None recommended by PT    Recommendations for Other Services       Precautions / Restrictions Precautions Precautions: Fall Restrictions Weight Bearing Restrictions: No      Mobility  Bed Mobility               General bed mobility comments: In recliner    Transfers Overall transfer level: Needs assistance Equipment used: Rolling walker (2 wheels) Transfers: Sit to/from Stand Sit to Stand: Min assist           General transfer comment: Min assist for boost from recliner, cues for hand placement and forward lean. Pt falls backwards posteriorly requiring assist to rise and stabilize. Performred x 2.    Ambulation/Gait Ambulation/Gait assistance: Mod assist Gait Distance (Feet): 40 Feet Assistive device: Rolling walker (2 wheels) Gait Pattern/deviations: Step-to pattern, Decreased stride length, Decreased dorsiflexion - right, Decreased weight shift to left, Shuffle, Trunk flexed, Drifts right/left, Narrow base of support Gait velocity: slow Gait velocity interpretation: <1.31 ft/sec, indicative of household ambulator   General Gait Details: Hemiparetic type gait showing difficulty advancing RLE, difficulty with weight shifting towards left and following VC for sequencing. Mod assist for balance, RW control and RLE advancement with gait throughout. No buckling however great difficulty sequencing and attempts to sit prematurely.   Stairs             Wheelchair Mobility    Modified Rankin (Stroke Patients Only)       Balance Overall balance assessment: Needs assistance Sitting-balance support: No upper extremity supported, Feet supported Sitting balance-Leahy Scale: Fair     Standing balance support: Bilateral upper extremity supported, During functional activity, Reliant on assistive device for balance Standing balance-Leahy Scale: Poor Standing balance comment: reliant on external support                            Cognition Arousal/Alertness: Awake/alert Behavior During Therapy: WFL for tasks assessed/performed Overall Cognitive Status:  No family/caregiver present to determine baseline cognitive functioning                                  General Comments: expressive difficulties, will often answers yes when he means no        Exercises      General Comments        Pertinent Vitals/Pain Pain Assessment Pain Assessment: No/denies pain    Home Living                          Prior Function            PT Goals (current goals can now be found in the care plan section) Acute Rehab PT Goals Patient Stated Goal: did not state, but agreaable to get up PT Goal Formulation: With patient/family Time For Goal Achievement: 02/14/22 Potential to Achieve Goals: Fair Progress towards PT goals: Progressing toward goals    Frequency    Min 4X/week      PT Plan Current plan remains appropriate    Co-evaluation              AM-PAC PT "6 Clicks" Mobility   Outcome Measure  Help needed turning from your back to your side while in a flat bed without using bedrails?: A Little Help needed moving from lying on your back to sitting on the side of a flat bed without using bedrails?: A Little Help needed moving to and from a bed to a chair (including a wheelchair)?: A Little Help needed standing up from a chair using your arms (e.g., wheelchair or bedside chair)?: A Little Help needed to walk in hospital room?: A Lot Help needed climbing 3-5 steps with a railing? : Total 6 Click Score: 15    End of Session Equipment Utilized During Treatment: Gait belt Activity Tolerance: Patient tolerated treatment well Patient left: in chair;with call bell/phone within reach;with chair alarm set;with family/visitor present Nurse Communication: Mobility status PT Visit Diagnosis: Unsteadiness on feet (R26.81);Other abnormalities of gait and mobility (R26.89);Repeated falls (R29.6);Muscle weakness (generalized) (M62.81);History of falling (Z91.81);Difficulty in walking, not elsewhere classified (R26.2);Other symptoms and signs involving the nervous system (R29.898)      Time: 4332-9518 PT Time Calculation (min) (ACUTE ONLY): 22 min  Charges:  $Gait Training: 8-22 mins                     Candie Mile, PT, DPT Physical Therapist [facility] [place]    Ellouise Newer 02/04/2022, 12:10 PM

## 2022-02-04 NOTE — Progress Notes (Addendum)
PROGRESS NOTE    Brandon Fischer  PIR:[id number] DOB: Oct 09, 1933 DOA: 01/31/2022 PCP: Deland Pretty, MD    Brief Narrative:   Brandon Fischer is a 89 y.o. male with past medical history significant for HTN, HLD, nonobstructive CAD, CHB s/p PPM, SVT s/p ablation, paroxysmal atrial fibrillation not on anticoagulation, CKD stage IIIb, cognitive impairment who presents to [facility] ED on 1/19 from home via EMS following a fall at home.  Fall unwitnessed and likely on the ground for several hours and he was reportedly covered in blood after he sustained a forehead laceration.  Patient reports  He had gotten up to use the restroom and states that he was using his walker when he fell.  It was reported that he hit his head on a vase.  He does complain of pain on his right knee at this time. His daughter states that he had stated that he was trying to pick up his device that is similar to a life alert on the floor that had led to the fall.  It took him several hours to get to his cell phone to call his daughter for help.   At baseline patient walks with a walker and states that he lives at home alone and [place] and his daughter [name] comes to check on him.  Review of records note patient had ED visits on 1/4 and 12/08/2021 related to falls.  In the emergency department patient was noted to be hypothermic with temperature 95.8 F with blood pressures documented as low as 110/41, and all other vital sign is maintained.  Labs significant for WBC 14.4, hemoglobin 7.1, BUN 21, creatinine 1.74, glucose 247 calcium 8.2, albumin 3.3, total bilirubin 1.7, high-sensitivity troponin 25-> 39, lactic acid 3.  CT imaging of the head and cervical spine did not note any acute abnormality.  CT imaging of the chest, abdomen, and pelvis significant for 6 cm subcutaneous hematoma overlying the spinous process of T10 and T11.  Laceration of the right brow was sutured by the ED provider.  Patient has been bolused 1 L of normal saline  IV fluids and then placed on lactated Ringer's 125 ml/h  Blood cultures have been obtained.  Patient has been typed and screened for possible need of blood products and ordered.  TRH consulted for admission and further evaluation management.  Assessment & Plan:   Hematoma  secondary to fall Frequent falls Patient presents after having a fall at home while going to the bathroom and/or bending over to pick up his life alert like device.  Noted to be on the floor for several hours prior to being able to reach her phone.  At baseline lives alone and ambulates with use of a walker.  Records note that patient has had falls back in November 2023 and earlier this month.  Unclear if patient is safe to be at home alone.  He has some type of life alert device, but was not able to get to it and it seems at the time. CT imaging of the chest, abdomen, and pelvis significant for 6 cm subcutaneous hematoma overlying the spinous process of T10 and T11. -- PT/OT evaluation: Recommended SNF --TOC for placement -- Fall precautions   SIRS Lactic acidosis: Resolved Hypothermia: Resolved On presentation patient was noted to have a temperature of 95.8 F with WBC elevated at 14.4 and lactic acid initially 3.  Chest x-ray was otherwise noted to be clear and CT imaging did not note any concern for a  infectious process.  COVID, RSV, influenza negative.  Urinalysis unrevealing.  Suspect leukocytosis could be reactive to above. -- WBC 14.4>>6.6>5.6 -- lactic acid 3.0>1.3 -- Blood cultures x 2: No growth x 4 days -- CBC daily  Acute blood loss anemia Patient presents with hemoglobin down to 7.1, but previously had been 9.4 on 1/4.  He had been found in a pool of blood noted to have multiple abrasions and lacerations on physical exam as the possible.  Patient has been typed and screened for possible need of blood products. -- Hgb 7.1>6.7>8.0>7.6>7.2>7.6 -- Transfused 1 unit PRBC 1/19; infuse additional unit PRBC today to  maintain hemoglobin greater than 8.0 given cardiac history -- Continue to hold aspirin  -- Repeat CBC in the a.m.  Forehead laceration From sustained fall at home.  5 simple interrupted sutures placed by EDP on 1/19.  Will need suture removal 5-7 days from initial placement (1/24 - 1/26).   Transient hypotension Blood pressures noted to be temporarily as low as 110/41.  Suspect secondary to acute blood loss.  Home blood pressure medication regimen includes hydralazine 10 mg 3 times daily, losartan 50 mg daily, metoprolol succinate 25 mg daily. -- Metoprolol succinate 25 mg p.o. daily -- Continue to hold home hydralazine and losartan -- Monitor BP closely  Hypokalemia Hypomagnesemia Repleted.   Acute kidney injury superimposed on chronic kidney disease 3A Patient presents with creatinine elevated up to 1.74 with BUN 21.  Baseline creatinine previously noted to be around 1.3.  Question the possibility of rhabdomyolysis as a cause for the acute worsening of kidney function, but CK was within normal limits at 124.  However, suspect symptoms likely related with acute blood loss, but question if patient is retaining given history of BPH.   -- Cr 1.74>1.62>1.35>1.26>1.28 -- IV fluids now discontinued, holding home losartan -- Repeat BMP in the a.m.   Right right knee pain after fall Patient noted to have significant bruising and abrasions of the right knee.  No gross deformity appreciated.  X-ray right knee with no fracture or dislocation. -- Tramadol  as needed for pain   Hyperglycemia On admission glucose elevated up to 247.  No prior history of diabetes.  Suspect secondary to acute stress response.  Hemoglobin A1c 4.0.   Dementia Patient has some short-term memory issues. --Delirium precautions --Get up during the day --Encourage a familiar face to remain present throughout the day --Keep blinds open and lights on during daylight hours --Minimize the use of  opioids/benzodiazepines --Melatonin 3 mg p.o. nightly   Expressive aphasia with gait disturbance Patient has issues with speaking where he will initially will say yes when questioning and has some difficulty in expressing what he wants to say.  No reported history of stroke as the cause.  CT head with no acute findings. -- MR brain without contrast: Pending   Complete heart block s/p PPM Patient has an Abbott dual-chamber pacemaker and is followed by Dr. Einar Gip of cardiology. Outpatient follow-up with cardiology.   BPH --Continue Flomax --Bladder scan as needed  HLD: --Atorvastatin 20 mg p.o. daily   GERD -Continue Protonix   DVT prophylaxis: SCDs Start: 01/31/22 0758    Code Status: DNR Family Communication: No family present at bedside this morning  Disposition Plan:  Level of care: Med-Surg Status is: Inpatient Remains inpatient appropriate because: Transfusing PRBCs today, will repeat hemoglobin in the a.m. and if stable will be medically ready at that time for discharge to SNF   Consultants:  None  Procedures:  None  Antimicrobials:  None   Subjective: Patient seen examined bedside, resting comfortably.  Lying in bed.  Eating breakfast.  Family present this morning.  No specific complaints this morning.  Hemoglobin less than 8.0, given his cardiac history we will transfuse 1 unit PRBC today.  Denies headache, no dizziness, no chest pain, no palpitations, no shortness of breath, no fever/chills/night sweats, no nausea/vomiting/diarrhea, no abdominal pain, no focal weakness, no fatigue, no paresthesias.  No acute events overnight per nurse staff.  Anticipate will be medically stable for discharge tomorrow to SNF if hemoglobin stable.  Objective: Vitals:   02/03/22 1651 02/03/22 2028 02/04/22 0545 02/04/22 0940  BP: (!) 169/70 (!) 162/76 (!) 152/63 (!) 154/67  Pulse: 70 79 68 66  Resp: '17 18 18 17  '$ Temp: 98.2 F (36.8 C) 98.4 F (36.9 C) 98.2 F (36.8 C) 98.3  F (36.8 C)  TempSrc:  Oral Oral   SpO2: 100% 100% 99% 100%  Weight:      Height:        Intake/Output Summary (Last 24 hours) at 02/04/2022 1118 Last data filed at 02/04/2022 0600 Gross per 24 hour  Intake 440 ml  Output 2250 ml  Net -1810 ml   Filed Weights   01/31/22 0308 01/31/22 1839  Weight: 72.6 kg 77.7 kg    Examination:  Physical Exam: GEN: NAD, alert and oriented x 3, chronically ill/elderly in appearance HEENT: PERRL, EOMI, sclera clear, MMM, forehead laceration noted with sutures in place well-approximated, several areas of ecchymosis noted PULM: CTAB w/o wheezes/crackles, normal respiratory effort, on room air CV: RRR w/o M/G/R GI: abd soft, NTND, NABS, no R/G/M MSK: no peripheral edema, moves all extremities independently, multiple areas of ecchymosis in various stages of healing NEURO: CN II-XII intact, no focal deficits, sensation to light touch intact PSYCH: normal mood/affect Integumentary: Forehead laceration noted with sutures in place, several areas noted to his extremities with ecchymosis in various stages of healing, no other concerning rashes/lesions/wounds noted on exposed skin surfaces.      Data Reviewed: I have personally reviewed following labs and imaging studies  CBC: Recent Labs  Lab 01/31/22 0330 01/31/22 1735 02/01/22 0915 02/02/22 0610 02/03/22 0354 02/04/22 0705  WBC 14.4*  --  8.2 6.6 5.6 5.0  HGB 7.1* 6.7* 8.0* 7.6* 7.2* 7.6*  HCT 22.4* 20.2* 24.0* 23.4* 22.0* 23.3*  MCV 79.4*  --  80.5 83.3 82.1 82.0  PLT 179  --  114* 114* 125* 829*   Basic Metabolic Panel: Recent Labs  Lab 01/31/22 0330 02/01/22 0915 02/02/22 0610 02/03/22 0354 02/04/22 0705  NA 140 139 138 137 136  K 3.5 3.8 3.6 3.9 4.0  CL 106 107 107 109 107  CO2 '23 26 22 24 25  '$ GLUCOSE 247* 162* 107* 109* 85  BUN '21 23 18 17 18  '$ CREATININE 1.74* 1.62* 1.35* 1.26* 1.28*  CALCIUM 8.2* 8.1* 8.0* 7.7* 7.9*  MG  --   --  1.4* 2.1  --    GFR: Estimated  Creatinine Clearance: 44.1 mL/min (A) (by C-G formula based on SCr of 1.28 mg/dL (H)). Liver Function Tests: Recent Labs  Lab 01/31/22 0330 02/01/22 0915  AST 21 69*  ALT 15 21  ALKPHOS 84 74  BILITOT 1.7* 1.9*  PROT 5.1* 4.7*  ALBUMIN 3.3* 2.9*   No results for input(s): "LIPASE", "AMYLASE" in the last 168 hours. No results for input(s): "AMMONIA" in the last 168 hours. Coagulation Profile: Recent Labs  Lab  01/31/22 0330  INR 1.3*   Cardiac Enzymes: Recent Labs  Lab 01/31/22 0330  CKTOTAL 124   BNP (last 3 results) No results for input(s): "PROBNP" in the last 8760 hours. HbA1C: No results for input(s): "HGBA1C" in the last 72 hours.  CBG: No results for input(s): "GLUCAP" in the last 168 hours. Lipid Profile: No results for input(s): "CHOL", "HDL", "LDLCALC", "TRIG", "CHOLHDL", "LDLDIRECT" in the last 72 hours. Thyroid Function Tests: No results for input(s): "TSH", "T4TOTAL", "FREET4", "T3FREE", "THYROIDAB" in the last 72 hours. Anemia Panel: Recent Labs    02/02/22 0603 02/03/22 0353  VITAMINB12 493  --   FOLATE  --  6.6  FERRITIN 105  --   TIBC 181*  --   IRON 43*  --   RETICCTPCT  --  4.5*   Sepsis Labs: Recent Labs  Lab 01/31/22 0330 01/31/22 0900  LATICACIDVEN 3.0* 1.3    Recent Results (from the past 240 hour(s))  Culture, blood (Routine X 2) w Reflex to ID Panel     Status: None (Preliminary result)   Collection Time: 01/31/22  3:30 AM   Specimen: BLOOD  Result Value Ref Range Status   Specimen Description BLOOD RIGHT ANTECUBITAL  Final   Special Requests   Final    BOTTLES DRAWN AEROBIC AND ANAEROBIC Blood Culture adequate volume   Culture   Final    NO GROWTH 4 DAYS Performed at [facility], [place] [address], [place], [place] [address]    Report Status PENDING  Incomplete  Culture, blood (Routine X 2) w Reflex to ID Panel     Status: None (Preliminary result)   Collection Time: 01/31/22  3:49 AM   Specimen: BLOOD  Result Value  Ref Range Status   Specimen Description BLOOD LEFT ANTECUBITAL  Final   Special Requests   Final    BOTTLES DRAWN AEROBIC AND ANAEROBIC Blood Culture adequate volume   Culture   Final    NO GROWTH 4 DAYS Performed at [facility], [place] [address] [place], [place] [address]    Report Status PENDING  Incomplete  Resp panel by RT-PCR (RSV, Flu A&B, Covid) Anterior Nasal Swab     Status: None   Collection Time: 01/31/22  8:47 AM   Specimen: Anterior Nasal Swab  Result Value Ref Range Status   SARS Coronavirus 2 by RT PCR NEGATIVE NEGATIVE Final    Comment: (NOTE) SARS-CoV-2 target nucleic acids are NOT DETECTED.  The SARS-CoV-2 RNA is generally detectable in upper respiratory specimens during the acute phase of infection. The lowest concentration of SARS-CoV-2 viral copies this assay can detect is 138 copies/mL. A negative result does not preclude SARS-Cov-2 infection and should not be used as the sole basis for treatment or other patient management decisions. A negative result may occur with  improper specimen collection/handling, submission of specimen other than nasopharyngeal swab, presence of viral mutation(s) within the areas targeted by this assay, and inadequate number of viral copies(<138 copies/mL). A negative result must be combined with clinical observations, patient history, and epidemiological information. The expected result is Negative.  Fact Sheet for Patients:  EntrepreneurPulse.com.au  Fact Sheet for Healthcare Providers:  IncredibleEmployment.be  This test is no t yet approved or cleared by the [place] FDA and  has been authorized for detection and/or diagnosis of SARS-CoV-2 by FDA under an Emergency Use Authorization (EUA). This EUA will remain  in effect (meaning this test can be used) for the duration of the  COVID-19 declaration under Section 564(b)(1) of the Act, 21 U.S.C.section 360bbb-3(b)(1), unless the  authorization is terminated  or revoked sooner.       Influenza A by PCR NEGATIVE NEGATIVE Final   Influenza B by PCR NEGATIVE NEGATIVE Final    Comment: (NOTE) The Xpert Xpress SARS-CoV-2/FLU/RSV plus assay is intended as an aid in the diagnosis of influenza from Nasopharyngeal swab specimens and should not be used as a sole basis for treatment. Nasal washings and aspirates are unacceptable for Xpert Xpress SARS-CoV-2/FLU/RSV testing.  Fact Sheet for Patients: EntrepreneurPulse.com.au  Fact Sheet for Healthcare Providers: IncredibleEmployment.be  This test is not yet approved or cleared by the [place] FDA and has been authorized for detection and/or diagnosis of SARS-CoV-2 by FDA under an Emergency Use Authorization (EUA). This EUA will remain in effect (meaning this test can be used) for the duration of the COVID-19 declaration under Section 564(b)(1) of the Act, 21 U.S.C. section 360bbb-3(b)(1), unless the authorization is terminated or revoked.     Resp Syncytial Virus by PCR NEGATIVE NEGATIVE Final    Comment: (NOTE) Fact Sheet for Patients: EntrepreneurPulse.com.au  Fact Sheet for Healthcare Providers: IncredibleEmployment.be  This test is not yet approved or cleared by the [place] FDA and has been authorized for detection and/or diagnosis of SARS-CoV-2 by FDA under an Emergency Use Authorization (EUA). This EUA will remain in effect (meaning this test can be used) for the duration of the COVID-19 declaration under Section 564(b)(1) of the Act, 21 U.S.C. section 360bbb-3(b)(1), unless the authorization is terminated or revoked.  Performed at [facility], [place] [address] [place], [place] [address]          Radiology Studies: No results found.      Scheduled Meds:  sodium chloride   Intravenous Once   atorvastatin  20 mg Oral Daily   ferrous sulfate  325 mg Oral  QHS   melatonin  3 mg Oral QHS   metoprolol succinate  25 mg Oral Daily   pantoprazole  40 mg Oral BID   sodium chloride flush  3 mL Intravenous Q12H   tamsulosin  0.4 mg Oral QPC supper   Continuous Infusions:     LOS: 3 days    Time spent: 48 minutes spent on chart review, discussion with nursing staff, consultants, updating family and interview/physical exam; more than 50% of that time was spent in counseling and/or coordination of care.    Natajah Derderian J [place] DO [facility] Available via Epic secure chat 7am-7pm After these hours, please refer to coverage provider listed on [email or web address] 02/04/2022, 11:18 AM

## 2022-02-04 NOTE — Progress Notes (Signed)
Occupational Therapy Treatment Patient Details Name: Brandon Fischer MRN: [id number] DOB: 1933/03/04 Today's Date: 02/04/2022   History of present illness Pt is an 89 y.o. male presenting to ED for fall with head strike. CT chest with 6cm subcutaneous hematoma overlying the spinous process of T10-11. PMH significant for HTN, HLD, nonobstructive CAD, complete heart block s/p PPM, SVT s/p ablation, paroxysmal atrial fibrillation, CKD stagd III, and memory loss.   OT comments  Patient received in supine and agreeable to OT session. Patient will often respond to questions with yes and change to no. Patient able to ambulate to bathroom for toilet transfer but requires assistance to pivot towards commode and with toilet hygiene. Discharge recommendations for SNF continue to be appropriate to provided necessary rehab intervention to increase independence and safety with functional transfers and self care.    Recommendations for follow up therapy are one component of a multi-disciplinary discharge planning process, led by the attending physician.  Recommendations may be updated based on patient status, additional functional criteria and insurance authorization.    Follow Up Recommendations  Skilled nursing-short term rehab (<3 hours/day)     Assistance Recommended at Discharge Frequent or constant Supervision/Assistance  Patient can return home with the following  A lot of help with walking and/or transfers;A lot of help with bathing/dressing/bathroom;Assistance with cooking/housework;Direct supervision/assist for financial management;Direct supervision/assist for medications management;Assist for transportation;Help with stairs or ramp for entrance   Equipment Recommendations  BSC/3in1    Recommendations for Other Services      Precautions / Restrictions Precautions Precautions: Fall Restrictions Weight Bearing Restrictions: No       Mobility Bed Mobility Overal bed mobility: Needs  Assistance Bed Mobility: Supine to Sit     Supine to sit: Min assist, HOB elevated     General bed mobility comments: min assist to raise trunk and to scoot forward to EOB    Transfers Overall transfer level: Needs assistance Equipment used: Rolling walker (2 wheels) Transfers: Sit to/from Stand Sit to Stand: Min assist           General transfer comment: min asst to stand and mod assist with pivoting/turning towareds toilet and recliner     Balance Overall balance assessment: Needs assistance Sitting-balance support: No upper extremity supported, Feet supported Sitting balance-Leahy Scale: Fair     Standing balance support: Bilateral upper extremity supported, During functional activity, Reliant on assistive device for balance Standing balance-Leahy Scale: Poor Standing balance comment: reliant on external support                           ADL either performed or assessed with clinical judgement   ADL Overall ADL's : Needs assistance/impaired     Grooming: Wash/dry hands;Wash/dry face;Oral care;Set up;Sitting           Upper Body Dressing : Set up;Sitting Upper Body Dressing Details (indicate cue type and reason): change gown     Toilet Transfer: Minimal assistance;Moderate assistance;Ambulation;Regular Toilet;Rolling walker (2 wheels) Toilet Transfer Details (indicate cue type and reason): difficulty turning towards toilet requiring mod assist Toileting- Clothing Manipulation and Hygiene: Total assistance;Sit to/from stand Toileting - Clothing Manipulation Details (indicate cue type and reason): required BUE support when standing and total assist for toilet hygiene       General ADL Comments: grooming performed seated due to reliant on BUE support when standing    Extremity/Trunk Assessment  Vision       Perception     Praxis      Cognition Arousal/Alertness: Awake/alert Behavior During Therapy: WFL for tasks  assessed/performed Overall Cognitive Status: No family/caregiver present to determine baseline cognitive functioning                                 General Comments: expressive difficulties, will often answers yes when he means no        Exercises      Shoulder Instructions       General Comments      Pertinent Vitals/ Pain       Pain Assessment Pain Assessment: No/denies pain  Home Living                                          Prior Functioning/Environment              Frequency  Min 2X/week        Progress Toward Goals  OT Goals(current goals can now be found in the care plan section)  Progress towards OT goals: Progressing toward goals  Acute Rehab OT Goals Patient Stated Goal: none stated OT Goal Formulation: With patient Time For Goal Achievement: 02/15/22 Potential to Achieve Goals: Good ADL Goals Pt Will Perform Grooming: with supervision;standing Pt Will Perform Lower Body Dressing: with supervision;sit to/from stand Pt Will Transfer to Toilet: with supervision;ambulating;bedside commode Pt Will Perform Toileting - Clothing Manipulation and hygiene: with supervision;sit to/from stand  Plan Discharge plan remains appropriate    Co-evaluation                 AM-PAC OT "6 Clicks" Daily Activity     Outcome Measure   Help from another person eating meals?: None Help from another person taking care of personal grooming?: A Little Help from another person toileting, which includes using toliet, bedpan, or urinal?: A Lot Help from another person bathing (including washing, rinsing, drying)?: A Lot Help from another person to put on and taking off regular upper body clothing?: A Little Help from another person to put on and taking off regular lower body clothing?: A Lot 6 Click Score: 16    End of Session Equipment Utilized During Treatment: Gait belt;Rolling walker (2 wheels)  OT Visit Diagnosis: Muscle  weakness (generalized) (M62.81);Unsteadiness on feet (R26.81);Other abnormalities of gait and mobility (R26.89);History of falling (Z91.81);Other symptoms and signs involving cognitive function;Cognitive communication deficit (R41.841)   Activity Tolerance Patient tolerated treatment well   Patient Left in chair;with call bell/phone within reach;with chair alarm set   Nurse Communication Mobility status        Time: 9407-6808 OT Time Calculation (min): 26 min  Charges: OT General Charges $OT Visit: 1 Visit OT Treatments $Self Care/Home Management : 23-37 mins  Lodema Hong, [place]  Office [phone]   Trixie Dredge 02/04/2022, 9:53 AM

## 2022-02-04 NOTE — Care Management Important Message (Signed)
Important Message  Patient Details  Name: Brandon Fischer MRN: [id number] Date of Birth: 03-31-1933   Medicare Important Message Given:  Yes     Juliona Vales [address] 02/04/2022, 12:58 PM

## 2022-02-04 NOTE — Care Management Important Message (Signed)
Important Message  Patient Details  Name: Brandon Fischer MRN: [id number] Date of Birth: June 14, 1933   Medicare Important Message Given:  Yes     Kasen Adduci [address] 02/04/2022, 9:53 AM

## 2022-02-04 NOTE — TOC Progression Note (Signed)
Transition of Care [facility]) - Initial/Assessment Note    Patient Details  Name: Brandon Fischer MRN: [id number] Date of Birth: November 27, 1933  Transition of Care [facility]) CM/SW Contact:    Milinda Antis, LCSWA Phone Number: 02/04/2022, 10:29 AM  Clinical Narrative:                 LCSW contacted by [facility] (SNF) the facility can accept the patient.  MD notified and relays that patient will be medically ready tomorrow.  Facility and patient's daughter, [name], notified.    Expected Discharge Plan: [place] Barriers to Discharge: Continued Medical Work up   Patient Goals and CMS Choice Patient states their goals for this hospitalization and ongoing recovery are:: return to his home with family support CMS Medicare.gov Compare Post Acute Care list provided to:: Patient Choice offered to / list presented to : Patient, Adult Children      Expected Discharge Plan and Services   Discharge Planning Services: CM Consult Post Acute Care Choice: [place] arrangements for the past 2 months: Single Family Home                                      Prior Living Arrangements/Services Living arrangements for the past 2 months: Single Family Home Lives with:: Self Patient language and need for interpreter reviewed:: Yes Do you feel safe going back to the place where you live?: Yes      Need for Family Participation in Patient Care: Yes (Comment) Care giver support system in place?: Yes (comment) Current home services: DME (RW, rollator, shower chair) Criminal Activity/Legal Involvement Pertinent to Current Situation/Hospitalization: No - Comment as needed  Activities of Daily Living Home Assistive Devices/Equipment: Dentures (specify type), Eyeglasses, Hearing aid, Shower chair with back, Walker (specify type) ADL Screening (condition at time of admission) Patient's cognitive ability adequate to safely complete daily activities?: No Is the patient  deaf or have difficulty hearing?: Yes Does the patient have difficulty seeing, even when wearing glasses/contacts?: No Does the patient have difficulty concentrating, remembering, or making decisions?: Yes Patient able to express need for assistance with ADLs?: Yes Does the patient have difficulty dressing or bathing?: Yes Independently performs ADLs?: No Communication: Needs assistance (expressive aphasia - answers "yes" to 75% of questions) Dressing (OT): Needs assistance Is this a change from baseline?: Pre-admission baseline Grooming: Dependent Is this a change from baseline?: Pre-admission baseline Feeding: Needs assistance Is this a change from baseline?: Change from baseline, expected to last <3 days Bathing: Needs assistance Is this a change from baseline?: Change from baseline, expected to last >3 days Toileting: Needs assistance Is this a change from baseline?: Change from baseline, expected to last >3days In/Out Bed: Needs assistance Is this a change from baseline?: Change from baseline, expected to last >3 days Walks in Home: Independent with device (comment) Is this a change from baseline?: Pre-admission baseline Does the patient have difficulty walking or climbing stairs?: Yes Weakness of Legs: Both Weakness of Arms/Hands: Both  Permission Sought/Granted Permission sought to share information with : Family Supports          Permission granted to share info w Relationship: daughter     Emotional Assessment Appearance:: Appears stated age Attitude/Demeanor/Rapport: Engaged Affect (typically observed): Accepting Orientation: : Oriented to Self, Oriented to Place, Oriented to  Time, Oriented to Situation Alcohol / Substance Use: Not Applicable Psych  Involvement: No (comment)  Admission diagnosis:  Hypothermia [T68.XXXA] Fall [W19.XXXA] Hypothermia, initial encounter [T68.XXXA] Fall, initial encounter [id number]] Acute on chronic renal failure ([place]) [N17.9,  N18.9] Patient Active Problem List   Diagnosis Date Noted   Acute on chronic renal failure ([place]) 02/01/2022   Hypothermia 01/31/2022   Fall 01/31/2022   SIRS (systemic inflammatory response syndrome) ([place]) 01/31/2022   Hematoma 01/31/2022   Lactic acidosis 01/31/2022   Acute blood loss anemia 01/31/2022   Transient hypotension 01/31/2022   Acute renal failure superimposed on stage 3b chronic kidney disease ([place]) 01/31/2022   Right knee pain 01/31/2022   Hyperglycemia 01/31/2022   Memory loss 01/31/2022   BPH (benign prostatic hyperplasia) 01/31/2022   GERD (gastroesophageal reflux disease) 01/31/2022   Pacemaker: Abbott Dual [place] DR-RF - [id number] 04/26/2021 04/26/2021   Encounter for care of pacemaker 04/26/2021   Complete heart block ([place]) 04/25/2021   Spinal stenosis at L4-L5 level 07/15/2018   S/P radiofrequency ablation operation for arrhythmia-SVT 04/28/2014   SVT (supraventricular tachycardia) 03/15/2014   Nephrolithiasis 01/17/2011   Hypertension    Dyslipidemia    Palpitation    PCP:  Deland Pretty, MD Pharmacy:   [place], [place] - [address] [place] [address] Phone: [phone] Fax: [phone]  [id number] [address]Phone: [phone]: [phone]    Social Determinants of Health (SDOH) Social History: SDOH Screenings   Food Insecurity: No Food Insecurity (02/01/2022)  Housing: Low Risk  (02/01/2022)  Transportation Needs: No Transportation Needs (02/01/2022)  Utilities: Not At Risk (02/01/2022)  Tobacco Use: Medium Risk (01/31/2022)   SDOH Interventions:     Readmission Risk Interventions     No data to display

## 2022-02-05 DIAGNOSIS — T68XXXA Hypothermia, initial encounter: Secondary | ICD-10-CM | POA: Diagnosis not present

## 2022-02-05 DIAGNOSIS — W19XXXA Unspecified fall, initial encounter: Secondary | ICD-10-CM | POA: Diagnosis not present

## 2022-02-05 LAB — CULTURE, BLOOD (ROUTINE X 2)
Culture: NO GROWTH
Culture: NO GROWTH
Special Requests: ADEQUATE
Special Requests: ADEQUATE

## 2022-02-05 LAB — CBC
HCT: 25.6 % — ABNORMAL LOW (ref 39.0–52.0)
Hemoglobin: 8.6 g/dL — ABNORMAL LOW (ref 13.0–17.0)
MCH: 27 pg (ref 26.0–34.0)
MCHC: 33.6 g/dL (ref 30.0–36.0)
MCV: 80.5 fL (ref 80.0–100.0)
Platelets: 164 10*3/uL (ref 150–400)
RBC: 3.18 MIL/uL — ABNORMAL LOW (ref 4.22–5.81)
RDW: 16.4 % — ABNORMAL HIGH (ref 11.5–15.5)
WBC: 5.2 10*3/uL (ref 4.0–10.5)
nRBC: 0 % (ref 0.0–0.2)

## 2022-02-05 LAB — BASIC METABOLIC PANEL
Anion gap: 10 (ref 5–15)
BUN: 19 mg/dL (ref 8–23)
CO2: 23 mmol/L (ref 22–32)
Calcium: 8.1 mg/dL — ABNORMAL LOW (ref 8.9–10.3)
Chloride: 104 mmol/L (ref 98–111)
Creatinine, Ser: 1.2 mg/dL (ref 0.61–1.24)
GFR, Estimated: 59 mL/min — ABNORMAL LOW (ref >60)
Glucose, Bld: 104 mg/dL — ABNORMAL HIGH (ref 70–99)
Potassium: 3.8 mmol/L (ref 3.5–5.1)
Sodium: 137 mmol/L (ref 135–145)

## 2022-02-05 LAB — TYPE AND SCREEN
ABO/RH(D): O POS
Antibody Screen: NEGATIVE
Unit division: 0

## 2022-02-05 LAB — BPAM RBC
Blood Product Expiration Date: 202402252359
ISSUE DATE / TIME: 202401231230
Unit Type and Rh: 5100

## 2022-02-05 MED ORDER — VITAMIN B-12 100 MCG PO TABS
100.0000 ug | ORAL_TABLET | Freq: Every day | ORAL | Status: DC
  Administered 2022-02-05 – 2022-02-06 (×2): 100 ug via ORAL
  Filled 2022-02-05 (×3): qty 1

## 2022-02-05 NOTE — TOC Progression Note (Signed)
Transition of Care [facility]) - Initial/Assessment Note    Patient Details  Name: Brandon Fischer MRN: [id number] Date of Birth: [birth year]  Transition of Care [facility]) CM/SW Contact:    Milinda Antis, LCSWA Phone Number: 02/05/2022, 1:57 PM  Clinical Narrative:                 LCSW contacted [facility] and patient's daughter to provide update about discharge.    TOC following.   Expected Discharge Plan: [place] Barriers to Discharge: Continued Medical Work up   Patient Goals and CMS Choice Patient states their goals for this hospitalization and ongoing recovery are:: return to his home with family support CMS Medicare.gov Compare Post Acute Care list provided to:: Patient Choice offered to / list presented to : Patient, Adult Children      Expected Discharge Plan and Services   Discharge Planning Services: CM Consult Post Acute Care Choice: [place] arrangements for the past 2 months: Single Family Home                                      Prior Living Arrangements/Services Living arrangements for the past 2 months: Single Family Home Lives with:: Self Patient language and need for interpreter reviewed:: Yes Do you feel safe going back to the place where you live?: Yes      Need for Family Participation in Patient Care: Yes (Comment) Care giver support system in place?: Yes (comment) Current home services: DME (RW, rollator, shower chair) Criminal Activity/Legal Involvement Pertinent to Current Situation/Hospitalization: No - Comment as needed  Activities of Daily Living Home Assistive Devices/Equipment: Dentures (specify type), Eyeglasses, Hearing aid, Shower chair with back, Walker (specify type) ADL Screening (condition at time of admission) Patient's cognitive ability adequate to safely complete daily activities?: No Is the patient deaf or have difficulty hearing?: Yes Does the patient have difficulty seeing, even when  wearing glasses/contacts?: No Does the patient have difficulty concentrating, remembering, or making decisions?: Yes Patient able to express need for assistance with ADLs?: Yes Does the patient have difficulty dressing or bathing?: Yes Independently performs ADLs?: No Communication: Needs assistance (expressive aphasia - answers "yes" to 75% of questions) Dressing (OT): Needs assistance Is this a change from baseline?: Pre-admission baseline Grooming: Dependent Is this a change from baseline?: Pre-admission baseline Feeding: Needs assistance Is this a change from baseline?: Change from baseline, expected to last <3 days Bathing: Needs assistance Is this a change from baseline?: Change from baseline, expected to last >3 days Toileting: Needs assistance Is this a change from baseline?: Change from baseline, expected to last >3days In/Out Bed: Needs assistance Is this a change from baseline?: Change from baseline, expected to last >3 days Walks in Home: Independent with device (comment) Is this a change from baseline?: Pre-admission baseline Does the patient have difficulty walking or climbing stairs?: Yes Weakness of Legs: Both Weakness of Arms/Hands: Both  Permission Sought/Granted Permission sought to share information with : Family Supports          Permission granted to share info w Relationship: daughter     Emotional Assessment Appearance:: Appears stated age Attitude/Demeanor/Rapport: Engaged Affect (typically observed): Accepting Orientation: : Oriented to Self, Oriented to Place, Oriented to  Time, Oriented to Situation Alcohol / Substance Use: Not Applicable Psych Involvement: No (comment)  Admission diagnosis:  Hypothermia [T68.XXXA] Fall [W19.XXXA] Hypothermia, initial encounter [T68.XXXA]  Fall, initial encounter [W19.XXXA] Acute on chronic renal failure ([place]) [N17.9, N18.9] Patient Active Problem List   Diagnosis Date Noted   Acute on chronic renal failure  ([place]) 02/01/2022   Hypothermia 01/31/2022   Fall 01/31/2022   SIRS (systemic inflammatory response syndrome) ([place]) 01/31/2022   Hematoma 01/31/2022   Lactic acidosis 01/31/2022   Acute blood loss anemia 01/31/2022   Transient hypotension 01/31/2022   Acute renal failure superimposed on stage 3b chronic kidney disease ([place]) 01/31/2022   Right knee pain 01/31/2022   Hyperglycemia 01/31/2022   Memory loss 01/31/2022   BPH (benign prostatic hyperplasia) 01/31/2022   GERD (gastroesophageal reflux disease) 01/31/2022   Pacemaker: Abbott Dual [place] DR-RF - [id number] 04/26/2021 04/26/2021   Encounter for care of pacemaker 04/26/2021   Complete heart block ([place]) 04/25/2021   Spinal stenosis at L4-L5 level 07/15/2018   S/P radiofrequency ablation operation for arrhythmia-SVT 04/28/2014   SVT (supraventricular tachycardia) 03/15/2014   Nephrolithiasis 01/17/2011   Hypertension    Dyslipidemia    Palpitation    PCP:  Deland Pretty, MD Pharmacy:   [place], [place] - [address] [place] [address] Phone: [phone] Fax: [phone]  [id number] [address]Phone: [phone]: [phone]    Social Determinants of Health (SDOH) Social History: SDOH Screenings   Food Insecurity: No Food Insecurity (02/01/2022)  Housing: Low Risk  (02/01/2022)  Transportation Needs: No Transportation Needs (02/01/2022)  Utilities: Not At Risk (02/01/2022)  Tobacco Use: Medium Risk (01/31/2022)   SDOH Interventions:     Readmission Risk Interventions     No data to display

## 2022-02-05 NOTE — Plan of Care (Signed)
  Problem: Health Behavior/Discharge Planning: Goal: Ability to manage health-related needs will improve Outcome: Progressing   Problem: Clinical Measurements: Goal: Ability to maintain clinical measurements within normal limits will improve Outcome: Progressing   Problem: Activity: Goal: Risk for activity intolerance will decrease Outcome: Progressing   Problem: Safety: Goal: Ability to remain free from injury will improve Outcome: Progressing

## 2022-02-05 NOTE — Progress Notes (Signed)
PROGRESS NOTE    Brandon Fischer  SNK:[id number] DOB: 02-Apr-1933 DOA: 01/31/2022 PCP: Deland Pretty, MD   Brief Narrative: 89 year old with past medical history significant for hypertension, hyperlipidemia, CAD, CHB s/p PPM, SVT s/p ablation, paroxysmal A-fib not on anticoagulation, CKD stage IIIb, cognitive impairment who presents to [facility] ED 1/19 from home via EMS following a fall at home.  Fall unwitnessed and likely on the ground for several hours and he was reportedly covered in blood after he sustained a forehead laceration.  He had gotten up to use the restroom and states that he was using his walker when he fell.  He hit his head on a vase. His daughter states that he had stated that he was trying to pick up his device that is similar to a life alert on the floor that had led to the fall.  It took him several hours to get to his cell phone to call his daughter for help.   ED patient was noted to be hypothermic temperature 95.8, blood pressure documented as low as 110/41.  Leukocytosis white blood cell 14, creatinine 1.7, bilirubin 1.7, troponin 25 lactic acid 3.  CT head and cervical spine did not show any acute abnormality.  CT chest abdomen and pelvis significant for 6 cm subcutaneous hematoma overlying the spinous process T10-T11.  Laceration of the right brow, sutured by ED provider.  He received IV fluids.    Assessment & Plan:   Principal Problem:   Hypothermia Active Problems:   Fall   Hematoma   SIRS (systemic inflammatory response syndrome) (HCC)   Lactic acidosis   Acute blood loss anemia   Transient hypotension   Acute renal failure superimposed on stage 3b chronic kidney disease (HCC)   Right knee pain   Hyperglycemia   Memory loss   Complete heart block (HCC)   BPH (benign prostatic hyperplasia)   GERD (gastroesophageal reflux disease)   Acute on chronic renal failure (HCC)   1-Hematoma secondary to fall Recurrent fall: CT imaging of the chest, abdomen,  and pelvis significant for 6 cm subcutaneous hematoma overlying the spinous process of T10 and T11.  PT OT recommending skilled Plan to discharge to skilled facility for rehab  2-SIRS Lactic acidosis: Resolved Hypothermia: Resolved Sepsis ruled out Patient presented with temperature 95.8, elevated white count and leukocytosis.  Chest x-ray negative for pneumonia.  CT did not show any concern for infectious process.  COVID RSV influenza negative.  UA unrevealing. Leukocytosis and lactic acidosis resolved.  Blood cultures negative.  3-Acute blood loss anemia: He was found on the floor in a pool of blood noted to have multiple abrasions and laceration on physical exam. He received 2 units of packed red blood cells during this hospitalization last blood transfusion 1/23. He has a subcutaneous hematoma thoracic level Hemoglobin since yesterday stable  4-forehead laceration: From sustaining fall at home. He had 5 simple interrupted sutures placed by ED PA on 1/19 Sutures need to be removed 5 to 7 days from initial presentation 1/26  5-Transient hypotension: Related to blood loss, medications. Holding hydralazine and losartan Continue with metoprolol  6-Hypokalemia, hypomagnesemia: Replace  7-Acute kidney injury superimposed on CKD stage IIIa: Presented with a creatinine up to 1.7.  Baseline 1.3. Suspect in the setting of hypovolemia, blood loss and hypotension Improved with IV fluids   Right knee pain after fall: He was noted to have significant bruising and abrasion of the right knee.  No gross deformity appreciated.  X-ray negative  for fracture.  Tramadol as needed for pain.  Hyperglycemia: A1c 4.0.  Suspect stress response  Dementia: Has some short-term memory issues.  Delirium precaution.  Expressive aphasia with gait disturbance: MRI pending  Complete heart block s/p PPM Patient has an Abbott dual-chamber pacemaker and is followed by Dr. Einar Gip of cardiology. Outpatient  follow-up with cardiology.   BPH --Continue Flomax --Bladder scan as needed   HLD: --Atorvastatin 20 mg p.o. daily   GERD -Continue Protonix     Estimated body mass index is 23.89 kg/m as calculated from the following:   Height as of this encounter: '5\' 11"'$  (1.803 m).   Weight as of this encounter: 77.7 kg.   DVT prophylaxis: SCD Code Status: DNR Family Communication: Disposition Plan:  Status is: Inpatient Remains inpatient appropriate because: awaiting MRI report    Consultants:  None  Procedures:    Antimicrobials:    Subjective: He is alert, denies pain. Answer yes or no to questions.   Objective: Vitals:   02/04/22 1658 02/04/22 2103 02/05/22 0522 02/05/22 0933  BP: (!) 179/65 (!) 148/73 (!) 155/79 (!) 150/60  Pulse: 73 65 73 61  Resp: '17 18 18   '$ Temp: 98.3 F (36.8 C) 98.9 F (37.2 C) 98 F (36.7 C) 98.3 F (36.8 C)  TempSrc:  Oral Oral Oral  SpO2: 98% 97% 100% 98%  Weight:      Height:        Intake/Output Summary (Last 24 hours) at 02/05/2022 1309 Last data filed at 02/05/2022 0900 Gross per 24 hour  Intake 620 ml  Output 2150 ml  Net -1530 ml   Filed Weights   01/31/22 0308 01/31/22 1839  Weight: 72.6 kg 77.7 kg    Examination:  General exam: Appears calm and comfortable  Respiratory system: Clear to auscultation. Respiratory effort normal. Cardiovascular system: S1 & S2 heard, RRR.  Gastrointestinal system: Abdomen is nondistended, soft and nontender. No organomegaly or masses felt. Normal bowel sounds heard. Central nervous system: Alert Extremities: Symmetric 5 x 5 power.   Data Reviewed: I have personally reviewed following labs and imaging studies  CBC: Recent Labs  Lab 02/01/22 0915 02/02/22 0610 02/03/22 0354 02/04/22 0705 02/04/22 2000 02/05/22 0343  WBC 8.2 6.6 5.6 5.0  --  5.2  HGB 8.0* 7.6* 7.2* 7.6* 9.1* 8.6*  HCT 24.0* 23.4* 22.0* 23.3* 28.0* 25.6*  MCV 80.5 83.3 82.1 82.0  --  80.5  PLT 114* 114* 125*  141*  --  376   Basic Metabolic Panel: Recent Labs  Lab 02/01/22 0915 02/02/22 0610 02/03/22 0354 02/04/22 0705 02/05/22 0343  NA 139 138 137 136 137  K 3.8 3.6 3.9 4.0 3.8  CL 107 107 109 107 104  CO2 '26 22 24 25 23  '$ GLUCOSE 162* 107* 109* 85 104*  BUN '23 18 17 18 19  '$ CREATININE 1.62* 1.35* 1.26* 1.28* 1.20  CALCIUM 8.1* 8.0* 7.7* 7.9* 8.1*  MG  --  1.4* 2.1  --   --    GFR: Estimated Creatinine Clearance: 47.1 mL/min (by C-G formula based on SCr of 1.2 mg/dL). Liver Function Tests: Recent Labs  Lab 01/31/22 0330 02/01/22 0915  AST 21 69*  ALT 15 21  ALKPHOS 84 74  BILITOT 1.7* 1.9*  PROT 5.1* 4.7*  ALBUMIN 3.3* 2.9*   No results for input(s): "LIPASE", "AMYLASE" in the last 168 hours. No results for input(s): "AMMONIA" in the last 168 hours. Coagulation Profile: Recent Labs  Lab 01/31/22 0330  INR  1.3*   Cardiac Enzymes: Recent Labs  Lab 01/31/22 0330  CKTOTAL 124   BNP (last 3 results) No results for input(s): "PROBNP" in the last 8760 hours. HbA1C: No results for input(s): "HGBA1C" in the last 72 hours. CBG: No results for input(s): "GLUCAP" in the last 168 hours. Lipid Profile: No results for input(s): "CHOL", "HDL", "LDLCALC", "TRIG", "CHOLHDL", "LDLDIRECT" in the last 72 hours. Thyroid Function Tests: No results for input(s): "TSH", "T4TOTAL", "FREET4", "T3FREE", "THYROIDAB" in the last 72 hours. Anemia Panel: Recent Labs    02/03/22 0353  FOLATE 6.6  RETICCTPCT 4.5*   Sepsis Labs: Recent Labs  Lab 01/31/22 0330 01/31/22 0900  LATICACIDVEN 3.0* 1.3    Recent Results (from the past 240 hour(s))  Culture, blood (Routine X 2) w Reflex to ID Panel     Status: None   Collection Time: 01/31/22  3:30 AM   Specimen: BLOOD  Result Value Ref Range Status   Specimen Description BLOOD RIGHT ANTECUBITAL  Final   Special Requests   Final    BOTTLES DRAWN AEROBIC AND ANAEROBIC Blood Culture adequate volume   Culture   Final    NO GROWTH 5  DAYS Performed at [facility], [place] [address], [place], [place] [address]    Report Status 02/05/2022 FINAL  Final  Culture, blood (Routine X 2) w Reflex to ID Panel     Status: None   Collection Time: 01/31/22  3:49 AM   Specimen: BLOOD  Result Value Ref Range Status   Specimen Description BLOOD LEFT ANTECUBITAL  Final   Special Requests   Final    BOTTLES DRAWN AEROBIC AND ANAEROBIC Blood Culture adequate volume   Culture   Final    NO GROWTH 5 DAYS Performed at [facility], [place] [address], [place], [place] [address]    Report Status 02/05/2022 FINAL  Final  Resp panel by RT-PCR (RSV, Flu A&B, Covid) Anterior Nasal Swab     Status: None   Collection Time: 01/31/22  8:47 AM   Specimen: Anterior Nasal Swab  Result Value Ref Range Status   SARS Coronavirus 2 by RT PCR NEGATIVE NEGATIVE Final    Comment: (NOTE) SARS-CoV-2 target nucleic acids are NOT DETECTED.  The SARS-CoV-2 RNA is generally detectable in upper respiratory specimens during the acute phase of infection. The lowest concentration of SARS-CoV-2 viral copies this assay can detect is 138 copies/mL. A negative result does not preclude SARS-Cov-2 infection and should not be used as the sole basis for treatment or other patient management decisions. A negative result may occur with  improper specimen collection/handling, submission of specimen other than nasopharyngeal swab, presence of viral mutation(s) within the areas targeted by this assay, and inadequate number of viral copies(<138 copies/mL). A negative result must be combined with clinical observations, patient history, and epidemiological information. The expected result is Negative.  Fact Sheet for Patients:  EntrepreneurPulse.com.au  Fact Sheet for Healthcare Providers:  IncredibleEmployment.be  This test is no t yet approved or cleared by the [place] FDA and  has been authorized for detection and/or  diagnosis of SARS-CoV-2 by FDA under an Emergency Use Authorization (EUA). This EUA will remain  in effect (meaning this test can be used) for the duration of the COVID-19 declaration under Section 564(b)(1) of the Act, 21 U.S.C.section 360bbb-3(b)(1), unless the authorization is terminated  or revoked sooner.       Influenza A by PCR NEGATIVE NEGATIVE Final   Influenza B by  PCR NEGATIVE NEGATIVE Final    Comment: (NOTE) The Xpert Xpress SARS-CoV-2/FLU/RSV plus assay is intended as an aid in the diagnosis of influenza from Nasopharyngeal swab specimens and should not be used as a sole basis for treatment. Nasal washings and aspirates are unacceptable for Xpert Xpress SARS-CoV-2/FLU/RSV testing.  Fact Sheet for Patients: EntrepreneurPulse.com.au  Fact Sheet for Healthcare Providers: IncredibleEmployment.be  This test is not yet approved or cleared by the [place] FDA and has been authorized for detection and/or diagnosis of SARS-CoV-2 by FDA under an Emergency Use Authorization (EUA). This EUA will remain in effect (meaning this test can be used) for the duration of the COVID-19 declaration under Section 564(b)(1) of the Act, 21 U.S.C. section 360bbb-3(b)(1), unless the authorization is terminated or revoked.     Resp Syncytial Virus by PCR NEGATIVE NEGATIVE Final    Comment: (NOTE) Fact Sheet for Patients: EntrepreneurPulse.com.au  Fact Sheet for Healthcare Providers: IncredibleEmployment.be  This test is not yet approved or cleared by the [place] FDA and has been authorized for detection and/or diagnosis of SARS-CoV-2 by FDA under an Emergency Use Authorization (EUA). This EUA will remain in effect (meaning this test can be used) for the duration of the COVID-19 declaration under Section 564(b)(1) of the Act, 21 U.S.C. section 360bbb-3(b)(1), unless the authorization is terminated  or revoked.  Performed at [facility], [place] [address], [place], [place] [address]          Radiology Studies: No results found.      Scheduled Meds:  sodium chloride   Intravenous Once   atorvastatin  20 mg Oral Daily   ferrous sulfate  325 mg Oral QHS   melatonin  3 mg Oral QHS   metoprolol succinate  25 mg Oral Daily   pantoprazole  40 mg Oral BID   sodium chloride flush  3 mL Intravenous Q12H   tamsulosin  0.4 mg Oral QPC supper   vitamin B-12  100 mcg Oral Daily   Continuous Infusions:   LOS: 4 days    Time spent: 35 minutes    Quentin Shorey A Shaheem Pichon, MD [facility]   If 7PM-7AM, please contact night-coverage [email or web address]  02/05/2022, 1:09 PM

## 2022-02-05 NOTE — Plan of Care (Signed)
  Problem: Health Behavior/Discharge Planning: Goal: Ability to manage health-related needs will improve Outcome: Progressing   Problem: Clinical Measurements: Goal: Ability to maintain clinical measurements within normal limits will improve Outcome: Progressing   

## 2022-02-05 NOTE — Progress Notes (Signed)
Mobility Specialist Progress Note:    02/05/22 1000  Mobility  Activity Ambulated with assistance in hallway  Level of Assistance Minimal assist, patient does 75% or more  Assistive Device Front wheel walker  Distance Ambulated (ft) 40 ft  Activity Response Tolerated well  Mobility Referral Yes  $Mobility charge 1 Mobility   Pt was agreeable for mobility session. Difficulty pivoting with RW, required MinA, otherwise MinG. Returned pt to chair with alarm on and all needs met.   Royetta Crochet Mobility Specialist Please contact via Solicitor or  Rehab office at [phone]

## 2022-02-06 ENCOUNTER — Emergency Department (HOSPITAL_COMMUNITY): Payer: Medicare Other

## 2022-02-06 ENCOUNTER — Inpatient Hospital Stay (HOSPITAL_COMMUNITY): Payer: Medicare Other

## 2022-02-06 ENCOUNTER — Emergency Department (HOSPITAL_COMMUNITY)
Admission: EM | Admit: 2022-02-06 | Discharge: 2022-02-07 | Disposition: A | Discharge status: Skilled Nursing Facility | Payer: Medicare Other | Attending: Emergency Medicine | Admitting: Emergency Medicine

## 2022-02-06 DIAGNOSIS — W19XXXA Unspecified fall, initial encounter: Secondary | ICD-10-CM | POA: Diagnosis not present

## 2022-02-06 DIAGNOSIS — S0990XA Unspecified injury of head, initial encounter: Secondary | ICD-10-CM | POA: Diagnosis present

## 2022-02-06 DIAGNOSIS — Z4802 Encounter for removal of sutures: Secondary | ICD-10-CM | POA: Diagnosis not present

## 2022-02-06 DIAGNOSIS — S199XXA Unspecified injury of neck, initial encounter: Secondary | ICD-10-CM | POA: Diagnosis not present

## 2022-02-06 DIAGNOSIS — Z9181 History of falling: Secondary | ICD-10-CM

## 2022-02-06 DIAGNOSIS — I10 Essential (primary) hypertension: Secondary | ICD-10-CM | POA: Diagnosis not present

## 2022-02-06 DIAGNOSIS — Z043 Encounter for examination and observation following other accident: Secondary | ICD-10-CM | POA: Diagnosis not present

## 2022-02-06 DIAGNOSIS — Z7982 Long term (current) use of aspirin: Secondary | ICD-10-CM | POA: Insufficient documentation

## 2022-02-06 DIAGNOSIS — I2510 Atherosclerotic heart disease of native coronary artery without angina pectoris: Secondary | ICD-10-CM | POA: Insufficient documentation

## 2022-02-06 DIAGNOSIS — S0181XA Laceration without foreign body of other part of head, initial encounter: Secondary | ICD-10-CM | POA: Diagnosis not present

## 2022-02-06 DIAGNOSIS — Z79899 Other long term (current) drug therapy: Secondary | ICD-10-CM | POA: Diagnosis not present

## 2022-02-06 DIAGNOSIS — I129 Hypertensive chronic kidney disease with stage 1 through stage 4 chronic kidney disease, or unspecified chronic kidney disease: Secondary | ICD-10-CM | POA: Diagnosis not present

## 2022-02-06 DIAGNOSIS — M7989 Other specified soft tissue disorders: Secondary | ICD-10-CM | POA: Diagnosis not present

## 2022-02-06 DIAGNOSIS — N183 Chronic kidney disease, stage 3 unspecified: Secondary | ICD-10-CM | POA: Insufficient documentation

## 2022-02-06 DIAGNOSIS — M1711 Unilateral primary osteoarthritis, right knee: Secondary | ICD-10-CM | POA: Diagnosis not present

## 2022-02-06 DIAGNOSIS — J3489 Other specified disorders of nose and nasal sinuses: Secondary | ICD-10-CM | POA: Diagnosis not present

## 2022-02-06 DIAGNOSIS — E872 Acidosis, unspecified: Secondary | ICD-10-CM | POA: Diagnosis not present

## 2022-02-06 DIAGNOSIS — I6523 Occlusion and stenosis of bilateral carotid arteries: Secondary | ICD-10-CM | POA: Diagnosis not present

## 2022-02-06 LAB — HEMOGLOBIN AND HEMATOCRIT, BLOOD
HCT: 34.5 % — ABNORMAL LOW (ref 39.0–52.0)
Hemoglobin: 11.3 g/dL — ABNORMAL LOW (ref 13.0–17.0)

## 2022-02-06 MED ORDER — HYDRALAZINE HCL 10 MG PO TABS: 10.0000 mg | ORAL_TABLET | Freq: Two times a day (BID) | ORAL | 0 refills | Status: DC

## 2022-02-06 MED ORDER — CYANOCOBALAMIN 100 MCG PO TABS: 100.0000 ug | ORAL_TABLET | Freq: Every day | ORAL | 0 refills | Status: DC

## 2022-02-06 MED ORDER — TRAMADOL HCL 50 MG PO TABS: 50.0000 mg | ORAL_TABLET | Freq: Four times a day (QID) | ORAL | 0 refills | Status: DC | PRN

## 2022-02-06 NOTE — Plan of Care (Signed)
  Problem: Clinical Measurements: Goal: Ability to maintain clinical measurements within normal limits will improve Outcome: Progressing   Problem: Activity: Goal: Risk for activity intolerance will decrease Outcome: Progressing   

## 2022-02-06 NOTE — ED Notes (Signed)
Patient transported to X-ray 

## 2022-02-06 NOTE — Progress Notes (Signed)
Physical Therapy Treatment Patient Details Name: Brandon Fischer MRN: [id number] DOB: 07/01/1933 Today's Date: 02/06/2022   History of Present Illness Pt is an 89 y.o. male presenting to ED for fall with head strike. CT chest with 6cm subcutaneous hematoma overlying the spinous process of T10-11. 1/25 MRI without acute cva. PMH significant for HTN, HLD, nonobstructive CAD, complete heart block s/p PPM, SVT s/p ablation, paroxysmal atrial fibrillation, CKD stagd III, and memory loss.    PT Comments    Showing some increased difficulty with transfer today. Mod assist for boost to stand with notable posterior lean. Focused on standing balance, reaching towards LOS, and weight shifting to facilitate gait. Mod assist for balance and walker control, with frequent assist for RLE advancement in room. Frequency changed due to plan for SNF. Patient will continue to benefit from skilled physical therapy services to further improve independence with functional mobility.    Recommendations for follow up therapy are one component of a multi-disciplinary discharge planning process, led by the attending physician.  Recommendations may be updated based on patient status, additional functional criteria and insurance authorization.  Follow Up Recommendations  Skilled nursing-short term rehab (<3 hours/day) Can patient physically be transported by private vehicle: Yes   Assistance Recommended at Discharge Frequent or constant Supervision/Assistance  Patient can return home with the following A lot of help with walking and/or transfers;A lot of help with bathing/dressing/bathroom;Assistance with cooking/housework;Assist for transportation;Help with stairs or ramp for entrance   Equipment Recommendations  None recommended by PT    Recommendations for Other Services       Precautions / Restrictions Precautions Precautions: Fall Restrictions Weight Bearing Restrictions: No     Mobility  Bed Mobility                General bed mobility comments: In recliner    Transfers Overall transfer level: Needs assistance Equipment used: Rolling walker (2 wheels) Transfers: Sit to/from Stand Sit to Stand: Mod assist           General transfer comment: Mod assist for boost to stand from recliner. Practiced 3x total with cues for sequencing, hand placement, positioning. Heavy posterior lean during transitions.    Ambulation/Gait Ambulation/Gait assistance: Mod assist Gait Distance (Feet): 18 Feet Assistive device: Rolling walker (2 wheels) Gait Pattern/deviations: Step-to pattern, Decreased stride length, Decreased dorsiflexion - right, Decreased weight shift to left, Shuffle, Trunk flexed, Drifts right/left, Narrow base of support Gait velocity: slow Gait velocity interpretation: <1.31 ft/sec, indicative of household ambulator Pre-gait activities: Standing weight shift General Gait Details: Hemiparetic type gait showing difficulty advancing RLE, difficulty with weight shifting towards left and following VC for sequencing. Mod assist for balance, RW control and RLE advancement with gait throughout. LLE intermittently buckling but able to recover. Facilitated weight shift and tempo which was modestly helpful for advancing LEs.   Stairs             Wheelchair Mobility    Modified Rankin (Stroke Patients Only)       Balance Overall balance assessment: Needs assistance Sitting-balance support: No upper extremity supported, Feet supported Sitting balance-Leahy Scale: Fair     Standing balance support: Bilateral upper extremity supported, During functional activity, Reliant on assistive device for balance Standing balance-Leahy Scale: Poor Standing balance comment: reliant on external support                            Cognition Arousal/Alertness: Awake/alert Behavior During Therapy:  WFL for tasks assessed/performed Overall Cognitive Status: No family/caregiver  present to determine baseline cognitive functioning                                 General Comments: expressive difficulties, will often answers yes when he means no        Exercises General Exercises - Lower Extremity Ankle Circles/Pumps: AAROM, Both, 15 reps, Seated Long Arc Quad: AAROM, Strengthening, Both, 10 reps, Seated Hip Flexion/Marching: AAROM, Strengthening, Both, 10 reps, Seated Other Exercises Other Exercises: Pt performed standing balance, reaching forward to facilitate weight shifting and extend limits of stability.    General Comments        Pertinent Vitals/Pain Pain Assessment Pain Assessment: No/denies pain    Home Living                          Prior Function            PT Goals (current goals can now be found in the care plan section) Acute Rehab PT Goals Patient Stated Goal: did not state, but agreaable to get up PT Goal Formulation: With patient/family Time For Goal Achievement: 02/14/22 Potential to Achieve Goals: Fair Progress towards PT goals: Progressing toward goals    Frequency    Min 2X/week      PT Plan Frequency needs to be updated    Co-evaluation              AM-PAC PT "6 Clicks" Mobility   Outcome Measure  Help needed turning from your back to your side while in a flat bed without using bedrails?: A Little Help needed moving from lying on your back to sitting on the side of a flat bed without using bedrails?: A Little Help needed moving to and from a bed to a chair (including a wheelchair)?: A Lot Help needed standing up from a chair using your arms (e.g., wheelchair or bedside chair)?: A Lot Help needed to walk in hospital room?: A Lot Help needed climbing 3-5 steps with a railing? : Total 6 Click Score: 13    End of Session Equipment Utilized During Treatment: Gait belt Activity Tolerance: Patient tolerated treatment well Patient left: in chair;with call bell/phone within reach;with  chair alarm set;with family/visitor present Nurse Communication: Mobility status PT Visit Diagnosis: Unsteadiness on feet (R26.81);Other abnormalities of gait and mobility (R26.89);Repeated falls (R29.6);Muscle weakness (generalized) (M62.81);History of falling (Z91.81);Difficulty in walking, not elsewhere classified (R26.2);Other symptoms and signs involving the nervous system (F68.127)     Time: 5170-0174 PT Time Calculation (min) (ACUTE ONLY): 22 min  Charges:  $Therapeutic Activity: 8-22 mins                     Candie Mile, PT, DPT Physical Therapist Acute Rehabilitation Services [place] [facility] Florencio Hollibaugh 02/06/2022, 3:10 PM

## 2022-02-06 NOTE — Progress Notes (Signed)
Mobility Specialist Progress Note:   02/06/22 1200  Mobility  Activity Transferred from bed to chair  Level of Assistance Minimal assist, patient does 75% or more  Assistive Device Front wheel walker  Distance Ambulated (ft) 5 ft  Activity Response Tolerated well  Mobility Referral Yes  $Mobility charge 1 Mobility   Pt just returned from MRI, agreeable to transfer to chair to eat lunch. Required MinA to stand from elevated bed, and take steps toward chair. Pt left with all needs met, chair alarm on. Visitor in room.  Nelta Numbers Mobility Specialist Please contact via SecureChat or  Rehab office at [phone]

## 2022-02-06 NOTE — Progress Notes (Signed)
Report given to Ainsley Spinner at [facility].

## 2022-02-06 NOTE — TOC Transition Note (Signed)
Transition of Care [facility]) - CM/SW Discharge Note   Patient Details  Name: Brandon Fischer MRN: [id number] Date of Birth: 08/09/1933  Transition of Care [facility]) CM/SW Contact:  Milinda Antis, [place] Phone Number: 02/06/2022, 3:09 PM   Clinical Narrative:    Patient will DC to: [facility] Anticipated DC date: 02/06/2022 Family notified: daughter, [name] Transport by: Family   Per MD patient ready for DC to SNF. RN to call report prior to discharge [phone] room 39). RN, patient, patient's family, and facility notified of DC. Discharge Summary sent to facility.  Family will transport.   CSW will sign off for now as social work intervention is no longer needed. Please consult [place] again if new needs arise.    Final next level of care: Skilled Nursing Facility Barriers to Discharge: Barriers Resolved   Patient Goals and CMS Choice CMS Medicare.gov Compare Post Acute Care list provided to:: Patient Choice offered to / list presented to : Adult Children  Discharge Placement                Patient chooses bed at:  [facility]) Patient to be transferred to facility by: Family Name of family member notified: Brandy Hale (Daughter) [phone] Patient and family notified of of transfer: 02/06/22  Discharge Plan and Services Additional resources added to the After Visit Summary for     Discharge Planning Services: CM Consult Post Acute Care Choice: Home Health                               Social Determinants of Health (SDOH) Interventions SDOH Screenings   Food Insecurity: No Food Insecurity (02/01/2022)  Housing: Low Risk  (02/01/2022)  Transportation Needs: No Transportation Needs (02/01/2022)  Utilities: Not At Risk (02/01/2022)  Tobacco Use: Medium Risk (01/31/2022)     Readmission Risk Interventions     No data to display

## 2022-02-06 NOTE — ED Provider Notes (Signed)
[facility] Provider Note  CSN: [id number] Arrival date & time: 02/06/22 2300  Chief Complaint(s) Fall  HPI Brandon Fischer is a 89 y.o. male with a past medical history listed below including memory loss/possible dementia, expressive aphasia, complete heart block status post pacemaker, who was discharged earlier in the day to a skilled nursing facility after being admitted for fall resulting in small forehead laceration but with acute blood loss anemia requiring 2 units of packed red blood cells during hospitalization.  He returns tonight after after an unwitnessed fall at the skilled nursing facility.  Patient was found down on the floor.  Appeared that he had hit his forehead again resulting in extension of the previous laceration.  Patient mostly answers yes or no to questions but is able to tell me his complete name, whether he hurts or not, and mention that he did not remember the fall but did remember trying to get up from the bed.  He currently denies any headache, neck pain, back pain, chest pain, abdominal pain or extremity pain.    Fall    Past Medical History Past Medical History:  Diagnosis Date   Bilateral carotid artery stenosis without cerebral infarction    ASYMPTOMATIC--  BILATERAL ICA  50-69%  PER CARDIOLOGIST NOTE, DR Einar Gip   Bilateral carotid bruits    LEFT  soft bruit, right no no carotid bruit per 07-24-2014 dr Einar Gip note   CKD (chronic kidney disease)    dr foster Narda Amber kidney, ckd stage 3 per lov 02-16-2019   Complete heart block ([place]) 44/03/4740   Complication of anesthesia    1 episode atrial fib 02-09-2014 [facility] and followed up with cardiology, not further issue   Coronary artery disease    NON-OBSTRUCTIVE CAD  per cath 08-06-2004 HX PALPITATIONS-AND -TACHYCARDIA-   Encounter for care of pacemaker 04/26/2021   First degree heart block    Full dentures    GERD (gastroesophageal reflux disease)    Heart  palpitations    Not any more   History of atrial fibrillation without current medication    EPISODE OF AFIB WITH RVR INTRAOPERATIVELY 02-09-2014 AT [facility]--  RESOLVED AND PT FOLLOWED UP WITH CARDIOLOGIST  DR Einar Gip   History of kidney stones    Hyperlipidemia    Hypertension    Memory loss    Nephrolithiasis    RIGHT   Organic impotence    Pacemaker: [employer] ASSURITY DR-RF - [id number] 04/26/2021   Psoriasis    Clearing up   PSVT (paroxysmal supraventricular tachycardia)    Right ureteral calculus    S/P radiofrequency ablation operation for arrhythmia-SVT 04/28/2014   Simple renal cyst    right   Wears glasses    Wears hearing aid    BILATERAL   Patient Active Problem List   Diagnosis Date Noted   Acute on chronic renal failure ([place]) 02/01/2022   Hypothermia 01/31/2022   Fall 01/31/2022   SIRS (systemic inflammatory response syndrome) ([place]) 01/31/2022   Hematoma 01/31/2022   Lactic acidosis 01/31/2022   Acute blood loss anemia 01/31/2022   Transient hypotension 01/31/2022   Acute renal failure superimposed on stage 3b chronic kidney disease ([place]) 01/31/2022   Right knee pain 01/31/2022   Hyperglycemia 01/31/2022   Memory loss 01/31/2022   BPH (benign prostatic hyperplasia) 01/31/2022   GERD (gastroesophageal reflux disease) 01/31/2022   Pacemaker: [employer]  PACEMAKER ASSURITY DR-RF - [id number] 04/26/2021 04/26/2021   Encounter for  care of pacemaker 04/26/2021   Complete heart block ([place]) 04/25/2021   Spinal stenosis at L4-L5 level 07/15/2018   S/P radiofrequency ablation operation for arrhythmia-SVT 04/28/2014   SVT (supraventricular tachycardia) 03/15/2014   Nephrolithiasis 01/17/2011   Hypertension    Dyslipidemia    Palpitation    Home Medication(s) Prior to Admission medications   Medication Sig Start Date End Date Taking? Authorizing Provider  Ascorbic Acid (VITAMIN C PO) Take 1 tablet by mouth daily.    [provider]   aspirin (ASPIRIN CHILDRENS) 81 MG chewable tablet Chew 1 tablet (81 mg total) by mouth daily. 07/20/20   Adrian Prows, MD  atorvastatin (LIPITOR) 20 MG tablet Take 20 mg by mouth daily.    [provider]  Cholecalciferol (VITAMIN D-3) 25 MCG (1000 UT) CAPS Take 1,000 Units by mouth daily with breakfast.    [provider]  diphenhydramine-acetaminophen (TYLENOL PM) 25-500 MG TABS tablet Take 1 tablet by mouth at bedtime as needed (for sleep).    [provider]  Ferrous Sulfate (IRON) 28 MG TABS Take 28 mg by mouth at bedtime.    [provider]  hydrALAZINE (APRESOLINE) 10 MG tablet Take 1 tablet (10 mg total) by mouth 2 (two) times daily. 02/06/22 03/08/22  Regalado, Belkys A, MD  metoprolol succinate (TOPROL-XL) 25 MG 24 hr tablet TAKE 1 TABLET (25 MG TOTAL) BY MOUTH DAILY. 07/15/21 02/01/23  Adrian Prows, MD  pantoprazole (PROTONIX) 40 MG tablet Take 40 mg by mouth in the morning and at bedtime.     [provider]  tamsulosin (FLOMAX) 0.4 MG CAPS capsule Take 0.4 mg by mouth daily after supper.    [provider]  traMADol (ULTRAM) 50 MG tablet Take 1 tablet (50 mg total) by mouth every 6 (six) hours as needed for moderate pain or severe pain. Post-operatively 02/06/22   Regalado, Belkys A, MD  traZODone (DESYREL) 50 MG tablet Take 50-100 mg by mouth daily as needed. 07/13/21   [provider]  vitamin B-12 100 MCG tablet Take 1 tablet (100 mcg total) by mouth daily. 02/07/22   Regalado, Belkys A, MD  zinc gluconate 50 MG tablet Take 50 mg by mouth daily.    [provider]                                                                                                                                    Allergies Bupropion, Olmesartan, and Trazodone  Review of Systems Review of Systems As noted in HPI  Physical Exam Vital Signs  I have reviewed the triage vital signs BP (!) 162/70   Pulse 74   Temp 99.5 F (37.5 C) (Oral)    Resp 19   Ht '5\' 11"'$  (1.803 m)   Wt 78 kg   SpO2 99%   BMI 23.98 kg/m   Physical Exam Constitutional:      General: He is not  in acute distress.    Appearance: He is well-developed. He is not diaphoretic.     Interventions: Cervical collar in place.  HENT:     Head: Normocephalic.      Comments: L-shaped laceration approx 7 cm in length. The medial 2 cm dehisced with 3 sutures in place (noted to have 5 in ER note).    Right Ear: External ear normal.     Left Ear: External ear normal.  Eyes:     General: No scleral icterus.       Right eye: No discharge.        Left eye: No discharge.     Conjunctiva/sclera: Conjunctivae normal.     Pupils: Pupils are equal, round, and reactive to light.  Cardiovascular:     Rate and Rhythm: Regular rhythm.     Pulses:          Radial pulses are 2+ on the right side and 2+ on the left side.       Dorsalis pedis pulses are 2+ on the right side and 2+ on the left side.     Heart sounds: Normal heart sounds. No murmur heard.    No friction rub. No gallop.  Pulmonary:     Effort: Pulmonary effort is normal. No respiratory distress.     Breath sounds: Normal breath sounds. No stridor.  Abdominal:     General: There is no distension.     Palpations: Abdomen is soft.     Tenderness: There is no abdominal tenderness.  Musculoskeletal:     Cervical back: Normal range of motion and neck supple. No bony tenderness.     Thoracic back: No bony tenderness.     Lumbar back: No bony tenderness.     Comments: Clavicle stable. Chest stable to AP/Lat compression. Pelvis stable to Lat compression. No obvious extremity deformity. No chest or abdominal wall contusion.  Skin:    General: Skin is warm.     Findings: Abrasion present.       Neurological:     Mental Status: He is alert.     Comments: Expressive aphasia Follows commands Moving all extremities with good strength      ED Results and Treatments Labs (all labs ordered are listed, but  only abnormal results are displayed) Labs Reviewed  CBC WITH DIFFERENTIAL/PLATELET - Abnormal; Notable for the following components:      Result Value   RBC 3.57 (*)    Hemoglobin 9.7 (*)    HCT 28.8 (*)    RDW 16.5 (*)    Lymphs Abs 0.5 (*)    Monocytes Absolute 1.1 (*)    All other components within normal limits  BASIC METABOLIC PANEL - Abnormal; Notable for the following components:   Glucose, Bld 145 (*)    BUN 24 (*)    Creatinine, Ser 1.26 (*)    Calcium 8.4 (*)    GFR, Estimated 56 (*)    All other components within normal limits  EKG  EKG Interpretation  Date/Time:  Thursday February 06 2022 23:19:39 EST Ventricular Rate:  77 PR Interval:  197 QRS Duration: 111 QT Interval:  407 QTC Calculation: 461 R Axis:   -58 Text Interpretation: A-V dual-paced rhythm with some inhibition No further analysis attempted due to paced rhythm Confirmed by Addison Lank [phone]) on 02/07/2022 4:37:50 AM       Radiology DG Hips Bilat W or Wo Pelvis 3-4 Views  Result Date: 02/07/2022 CLINICAL DATA:  fall EXAM: DG HIP (WITH OR WITHOUT PELVIS) 3-4V BILAT COMPARISON:  None Available. FINDINGS: Limited evaluation due to overlapping osseous structures and overlying soft tissues. There is no evidence of hip fracture or dislocation of either hips. No acute displaced fracture or diastasis of the bones of the pelvis. There is no evidence of arthropathy or other focal bone abnormality. Vascular calcifications. Partially visualized lumbar spine surgical hardware. IMPRESSION: Negative for acute traumatic injury. Electronically Signed   By: Iven Finn M.D.   On: 02/07/2022 00:54   DG Knee Complete 4 Views Right  Result Date: 02/07/2022 CLINICAL DATA:  190176 Fall 190176 EXAM: RIGHT KNEE - COMPLETE 4+ VIEW COMPARISON:  None Available. FINDINGS: No evidence of fracture, dislocation,  or joint effusion. Mild tricompartmental degenerative changes of the knee. Soft tissues are unremarkable. Vascular calcifications. IMPRESSION: No acute displaced fracture or dislocation. Electronically Signed   By: Iven Finn M.D.   On: 02/07/2022 00:52   DG Hand Complete Left  Result Date: 02/07/2022 CLINICAL DATA:  Fall and trauma to the left hand. EXAM: LEFT HAND - COMPLETE 3+ VIEW COMPARISON:  None Available. FINDINGS: No acute fracture or dislocation. The bones are osteopenic. There is arthritic changes of the phalangeal joints. There is mild soft tissue swelling. No radiopaque foreign object or soft tissue gas. IMPRESSION: 1. No acute fracture or dislocation. 2. Mild soft tissue swelling. Electronically Signed   By: Anner Crete M.D.   On: 02/07/2022 00:52   CT Head Wo Contrast  Result Date: 02/07/2022 CLINICAL DATA:  Trauma. EXAM: CT HEAD WITHOUT CONTRAST CT CERVICAL SPINE WITHOUT CONTRAST TECHNIQUE: Multidetector CT imaging of the head and cervical spine was performed following the standard protocol without intravenous contrast. Multiplanar CT image reconstructions of the cervical spine were also generated. RADIATION DOSE REDUCTION: This exam was performed according to the departmental dose-optimization program which includes automated exposure control, adjustment of the mA and/or kV according to patient size and/or use of iterative reconstruction technique. COMPARISON:  CT dated 01/31/2022. FINDINGS: CT HEAD FINDINGS Brain: Moderate age-related atrophy and chronic microvascular ischemic changes. There is no acute intracranial hemorrhage. No mass effect or midline shift. No extra-axial fluid collection. Vascular: No hyperdense vessel or unexpected calcification. Skull: Normal. Negative for fracture or focal lesion. Sinuses/Orbits: Mild mucoperiosteal thickening of paranasal sinuses. No air-fluid level. The mastoid air cells are clear. Other: Right forehead laceration. CT CERVICAL SPINE  FINDINGS Alignment: No acute subluxation. Skull base and vertebrae: No acute fracture.  Osteopenia. Soft tissues and spinal canal: No prevertebral fluid or swelling. No visible canal hematoma. Disc levels:  No acute findings.  Degenerative changes. Upper chest: Negative. Other: Bilateral carotid bulb calcified plaques. IMPRESSION: 1. No acute intracranial pathology. Moderate age-related atrophy and chronic microvascular ischemic changes. 2. No acute/traumatic cervical spine pathology. Electronically Signed   By: Anner Crete M.D.   On: 02/07/2022 00:49   CT Cervical Spine Wo Contrast  Result Date: 02/07/2022 CLINICAL DATA:  Trauma. EXAM: CT HEAD WITHOUT CONTRAST CT CERVICAL SPINE WITHOUT  CONTRAST TECHNIQUE: Multidetector CT imaging of the head and cervical spine was performed following the standard protocol without intravenous contrast. Multiplanar CT image reconstructions of the cervical spine were also generated. RADIATION DOSE REDUCTION: This exam was performed according to the departmental dose-optimization program which includes automated exposure control, adjustment of the mA and/or kV according to patient size and/or use of iterative reconstruction technique. COMPARISON:  CT dated 01/31/2022. FINDINGS: CT HEAD FINDINGS Brain: Moderate age-related atrophy and chronic microvascular ischemic changes. There is no acute intracranial hemorrhage. No mass effect or midline shift. No extra-axial fluid collection. Vascular: No hyperdense vessel or unexpected calcification. Skull: Normal. Negative for fracture or focal lesion. Sinuses/Orbits: Mild mucoperiosteal thickening of paranasal sinuses. No air-fluid level. The mastoid air cells are clear. Other: Right forehead laceration. CT CERVICAL SPINE FINDINGS Alignment: No acute subluxation. Skull base and vertebrae: No acute fracture.  Osteopenia. Soft tissues and spinal canal: No prevertebral fluid or swelling. No visible canal hematoma. Disc levels:  No acute  findings.  Degenerative changes. Upper chest: Negative. Other: Bilateral carotid bulb calcified plaques. IMPRESSION: 1. No acute intracranial pathology. Moderate age-related atrophy and chronic microvascular ischemic changes. 2. No acute/traumatic cervical spine pathology. Electronically Signed   By: Anner Crete M.D.   On: 02/07/2022 00:49   MR BRAIN WO CONTRAST  Result Date: 02/06/2022 CLINICAL DATA:  Acute stroke suspected EXAM: MRI HEAD WITHOUT CONTRAST TECHNIQUE: Multiplanar, multiecho pulse sequences of the brain and surrounding structures were obtained without intravenous contrast. COMPARISON:  CT Head 01/31/22 FINDINGS: Brain: No acute infarction, parenchymal hematoma, hydrocephalus, extra-axial collection or mass lesion. There is sequela of moderate to severe chronic microvascular ischemic change. There is a small punctate focus of microhemorrhage in the right occipital lobe Vascular: Normal flow voids. Skull and upper cervical spine: Normal marrow signal. Sinuses/Orbits: Bilateral lens replacement. Trace bilateral mastoid effusions. Mild mucosal thickening bilateral ethmoid sinuses. Other: None IMPRESSION: 1. No acute intracranial abnormality. 2. Sequela of moderate to severe chronic microvascular ischemic change. Electronically Signed   By: Marin Roberts M.D.   On: 02/06/2022 11:34    Medications Ordered in ED Medications  lidocaine-EPINEPHrine (XYLOCAINE W/EPI) 2 %-1:200000 (PF) injection 20 mL (0 mLs Intradermal Hold 02/07/22 0124)                                                                                                                                     Procedures .Suture Removal  Date/Time: 02/07/2022 4:40 AM  Performed by: Fatima Blank, MD Authorized by: Fatima Blank, MD   Consent:    Consent obtained:  Verbal   Consent given by:  Patient and guardian Universal protocol:    Imaging studies available: yes   Location:    Location:  Head/neck    Head/neck location:  Forehead Procedure details:    Number of sutures removed:  3 ..Laceration Repair  Date/Time: 02/07/2022 4:40 AM  Performed by: Fatima Blank,  MD Authorized by: Fatima Blank, MD   Consent:    Consent obtained:  Verbal   Consent given by:  Patient and guardian   Risks discussed:  Infection, pain, need for additional repair, poor cosmetic result and poor wound healing Universal protocol:    Imaging studies available: yes   Anesthesia:    Anesthesia method:  Local infiltration   Local anesthetic:  Lidocaine 2% WITH epi Laceration details:    Location:  Face   Face location:  Forehead   Length (cm):  7   Depth (mm):  3 Pre-procedure details:    Preparation:  Patient was prepped and draped in usual sterile fashion and imaging obtained to evaluate for foreign bodies Exploration:    Hemostasis achieved with:  Direct pressure   Wound exploration: wound explored through full range of motion   Treatment:    Area cleansed with:  Povidone-iodine   Amount of cleaning:  Standard   Irrigation solution:  Sterile saline   Irrigation method:  Pressure wash Skin repair:    Repair method:  Sutures   Suture size:  5-0   Suture material:  Chromic gut   Suture technique:  Running locked   Number of sutures:  14 Approximation:    Approximation:  Close Repair type:    Repair type:  Simple Post-procedure details:    Dressing:  Non-adherent dressing   Procedure completion:  Tolerated   (including critical care time)  Medical Decision Making / ED Course   Medical Decision Making Amount and/or Complexity of Data Reviewed External Data Reviewed: notes.    Details: Admission note mentioned HPI Labs: ordered. Decision-making details documented in ED Course. Radiology: ordered and independent interpretation performed. Decision-making details documented in ED Course. ECG/medicine tests: ordered and independent interpretation performed. Decision-making  details documented in ED Course.  Risk Prescription drug management.    Unwitnessed fall in a skilled nursing facility. Patient is nonambulatory and fall risk. Appears he attempted to get out of bed prompting the fall. During recent admission patient was noted to have anemia. Also has a pacemaker.  Will get CBC to assess hemoglobin Interrogate pacemaker Will obtain imaging to assess for any acute injuries including ICH, cervical injury, hand, knee, and hip injuries.  CBC with hemoglobin of 9.7.  Down from 11.2. Metabolic panel without significant electrolyte derangements.  Mild renal insufficiency without AKI CT head negative for ICH. CT cervical spine negative for acute fracture. Plain films of the left hand, right knee and hips negative for any acute fracture or dislocation. Patient interrogation without any acute events prompting syncope  Sutures from the prior dehisced laceration were removed.  Only 3 out of the 5 visualized. Laceration was thoroughly irrigated and closed as above       Final Clinical Impression(s) / ED Diagnoses Final diagnoses:  Laceration of other part of head without foreign body, initial encounter  At high risk for falls   The patient appears reasonably screened and/or stabilized for discharge and I doubt any other medical condition or other [facility] requiring further screening, evaluation, or treatment in the ED at this time. I have discussed the findings, Dx and Tx plan with the patient/family who expressed understanding and agree(s) with the plan. Discharge instructions discussed at length. The patient/family was given strict return precautions who verbalized understanding of the instructions. No further questions at time of discharge.  Disposition: Discharge  Condition: Good  ED Discharge Orders     None  Follow Up: Deland Pretty, [address] [place] [address] [phone]  Call  to schedule an  appointment for close follow up to repeat hemoglobin in 3-5 days           This chart was dictated using voice recognition software.  Despite best efforts to proofread,  errors can occur which can change the documentation meaning.    Fatima Blank, MD 02/07/22 [phone]

## 2022-02-06 NOTE — ED Notes (Signed)
Pt vocalizes pain response when right flank is palpated, light green/purple bruise noted in area. Large laceration noted to right forehead, evidence of previous stitching in area. Wound appears to have been opened during fall that occurred tonight.

## 2022-02-06 NOTE — Progress Notes (Signed)
DISCHARGE NOTE [facility] to be discharged Skilled nursing facility per MD order. Discussed prescriptions and follow up appointments with the patient. Prescriptions given to patient; medication list explained in detail. Patient family verbalized understanding.  Skin clean, dry and intact without evidence of skin break down, no evidence of skin tears noted. IV catheter discontinued intact. Site without signs and symptoms of complications. Dressing and pressure applied. Pt denies pain at the site currently. No complaints noted.   Patient leaving with condom cath per facility   [facility] due to being transported by family per facility  Patient free of other  lines, drains, and new wounds.   An After Visit Summary (AVS) was printed and given to the patient family to give to facility Patient escorted via wheelchair, and discharged to family to transsoport to [employer] via private auto. Snf had agreed with family to transport by family to facility Per Education officer, museum.  Berneta Levins, RN

## 2022-02-06 NOTE — Discharge Summary (Signed)
Physician Discharge Summary   Patient: [place] Fischer MRN: [id number] DOB: 1933-10-31  Admit date:     01/31/2022  Discharge date: 02/06/22  Discharge Physician: Elmarie Shiley   PCP: Deland Pretty, MD   Recommendations at discharge:    Needs follow up with neurologist for further evaluation of dementia and difficulty finding words.  Needs CBC to monitor Hb.  Needs facial stiches removes 1/26  Discharge Diagnoses: Principal Problem:   Hypothermia Active Problems:   Fall   Hematoma   SIRS (systemic inflammatory response syndrome) (HCC)   Lactic acidosis   Acute blood loss anemia   Transient hypotension   Acute renal failure superimposed on stage 3b chronic kidney disease (HCC)   Right knee pain   Hyperglycemia   Memory loss   Complete heart block (HCC)   BPH (benign prostatic hyperplasia)   GERD (gastroesophageal reflux disease)   Acute on chronic renal failure (HCC)  Resolved Problems:   * No resolved hospital problems. [facility] Course: 89 year old with past medical history significant for hypertension, hyperlipidemia, CAD, CHB s/p PPM, SVT s/p ablation, paroxysmal A-fib not on anticoagulation, CKD stage IIIb, cognitive impairment who presents to [facility] ED 1/19 from home via EMS following a fall at home.  Fall unwitnessed and likely on the ground for several hours and he was reportedly covered in blood after he sustained a forehead laceration.  He had gotten up to use the restroom and states that he was using his walker when he fell.  He hit his head on a vase. His daughter states that he had stated that he was trying to pick up his device that is similar to a life alert on the floor that had led to the fall.  It took him several hours to get to his cell phone to call his daughter for help.    ED patient was noted to be hypothermic temperature 95.8, blood pressure documented as low as 110/41.  Leukocytosis white blood cell 14, creatinine 1.7, bilirubin 1.7,  troponin 25 lactic acid 3.  CT head and cervical spine did not show any acute abnormality.  CT chest abdomen and pelvis significant for 6 cm subcutaneous hematoma overlying the spinous process T10-T11.  Laceration of the right brow, sutured by ED provider.  He received IV fluids.    Assessment and Plan: 1-Hematoma secondary to fall Recurrent fall: CT imaging of the chest, abdomen, and pelvis significant for 6 cm subcutaneous hematoma overlying the spinous process of T10 and T11.  PT OT recommending skilled Plan to discharge to skilled facility for rehab   2-SIRS Lactic acidosis: Resolved Hypothermia: Resolved Sepsis ruled out Patient presented with temperature 95.8, elevated white count and leukocytosis.  Chest x-ray negative for pneumonia.  CT did not show any concern for infectious process.  COVID RSV influenza negative.  UA unrevealing. Leukocytosis and lactic acidosis resolved.  Blood cultures negative.   3-Acute blood loss anemia: He was found on the floor in a pool of blood noted to have multiple abrasions and laceration on physical exam. He received 2 units of packed red blood cells during this hospitalization last blood transfusion 1/23. He has a subcutaneous hematoma thoracic level Hemoglobin since yesterday stable   4-forehead laceration: From sustaining fall at home. He had 5 simple interrupted sutures placed by ED PA on 1/19 Sutures need to be removed 5 to 7 days from initial presentation 1/26   5-Transient hypotension: Related to blood loss, medications. Holding  losartan Continue with  metoprolol BP increasing, resume low dose hydralazine.    6-Hypokalemia, hypomagnesemia: Replaced   7-Acute kidney injury superimposed on CKD stage IIIa: Presented with a creatinine up to 1.7.  Baseline 1.3. Suspect in the setting of hypovolemia, blood loss and hypotension Improved with IV fluids     Right knee pain after fall: He was noted to have significant bruising and  abrasion of the right knee.  No gross deformity appreciated.  X-ray negative for fracture.  Tramadol as needed for pain.   Hyperglycemia: A1c 4.0.  Suspect stress response   Dementia: Has some short-term memory issues.  Delirium precaution.   Expressive aphasia with gait disturbance: MRI pending   Complete heart block s/p PPM Patient has an Abbott dual-chamber pacemaker and is followed by Dr. Einar Gip of cardiology. Outpatient follow-up with cardiology.   BPH --Continue Flomax --Bladder scan as needed   HLD: --Atorvastatin 20 mg p.o. daily   GERD -Continue Protonix          Consultants: None Procedures performed: none Disposition: Skilled nursing facility Diet recommendation:  Discharge Diet Orders (From admission, onward)     Start     Ordered   02/06/22 0000  Diet - low sodium heart healthy        02/06/22 1318           Cardiac diet DISCHARGE MEDICATION: Allergies as of 02/06/2022       Reactions   Bupropion    Other reaction(s): disoriented   Olmesartan    Other reaction(s): rash   Trazodone    Other reaction(s): unsteady gait 06/26/21 sometimes takes 1 as needed        Medication List     STOP taking these medications    losartan 50 MG tablet Commonly known as: COZAAR       TAKE these medications    aspirin 81 MG chewable tablet Commonly known as: Aspirin Childrens Chew 1 tablet (81 mg total) by mouth daily.   atorvastatin 20 MG tablet Commonly known as: LIPITOR Take 20 mg by mouth daily.   cyanocobalamin 100 MCG tablet Take 1 tablet (100 mcg total) by mouth daily. Start taking on: February 07, 2022   diphenhydramine-acetaminophen 25-500 MG Tabs tablet Commonly known as: TYLENOL PM Take 1 tablet by mouth at bedtime as needed (for sleep).   hydrALAZINE 10 MG tablet Commonly known as: APRESOLINE Take 1 tablet (10 mg total) by mouth 2 (two) times daily. What changed: when to take this   Iron 28 MG Tabs Take 28 mg by mouth at  bedtime.   metoprolol succinate 25 MG 24 hr tablet Commonly known as: TOPROL-XL TAKE 1 TABLET (25 MG TOTAL) BY MOUTH DAILY.   pantoprazole 40 MG tablet Commonly known as: PROTONIX Take 40 mg by mouth in the morning and at bedtime.   tamsulosin 0.4 MG Caps capsule Commonly known as: FLOMAX Take 0.4 mg by mouth daily after supper.   traMADol 50 MG tablet Commonly known as: ULTRAM Take 1 tablet (50 mg total) by mouth every 6 (six) hours as needed for moderate pain or severe pain. Post-operatively   traZODone 50 MG tablet Commonly known as: DESYREL Take 50-100 mg by mouth daily as needed.   VITAMIN C PO Take 1 tablet by mouth daily.   Vitamin D-3 25 MCG (1000 UT) Caps Take 1,000 Units by mouth daily with breakfast.   zinc gluconate 50 MG tablet Take 50 mg by mouth daily.        Discharge Exam:  Filed Weights   01/31/22 0308 01/31/22 1839  Weight: 72.6 kg 77.7 kg   General; NAD  Condition at discharge: stable  The results of significant diagnostics from this hospitalization (including imaging, microbiology, ancillary and laboratory) are listed below for reference.   Imaging Studies: MR BRAIN WO CONTRAST  Result Date: 02/06/2022 CLINICAL DATA:  Acute stroke suspected EXAM: MRI HEAD WITHOUT CONTRAST TECHNIQUE: Multiplanar, multiecho pulse sequences of the brain and surrounding structures were obtained without intravenous contrast. COMPARISON:  CT Head 01/31/22 FINDINGS: Brain: No acute infarction, parenchymal hematoma, hydrocephalus, extra-axial collection or mass lesion. There is sequela of moderate to severe chronic microvascular ischemic change. There is a small punctate focus of microhemorrhage in the right occipital lobe Vascular: Normal flow voids. Skull and upper cervical spine: Normal marrow signal. Sinuses/Orbits: Bilateral lens replacement. Trace bilateral mastoid effusions. Mild mucosal thickening bilateral ethmoid sinuses. Other: None IMPRESSION: 1. No acute  intracranial abnormality. 2. Sequela of moderate to severe chronic microvascular ischemic change. Electronically Signed   By: Marin Roberts M.D.   On: 02/06/2022 11:34   DG Knee Complete 4 Views Right  Result Date: 01/31/2022 CLINICAL DATA:  Pain, fall with laceration to knee EXAM: RIGHT KNEE - COMPLETE 4 VIEW COMPARISON:  None Available. FINDINGS: No acute fracture or dislocation. Trace joint effusion. Minimal tricompartment osteophytosis. Small suprapatellar enthesophyte. Scattered vascular calcifications. No radiopaque foreign body. IMPRESSION: No acute fracture or dislocation. Electronically Signed   By: Beryle Flock M.D.   On: 01/31/2022 08:35   CT CHEST ABDOMEN PELVIS WO CONTRAST  Result Date: 01/31/2022 CLINICAL DATA:  Fall with head strike EXAM: CT CHEST, ABDOMEN AND PELVIS WITHOUT CONTRAST TECHNIQUE: Multidetector CT imaging of the chest, abdomen and pelvis was performed following the standard protocol without IV contrast. RADIATION DOSE REDUCTION: This exam was performed according to the departmental dose-optimization program which includes automated exposure control, adjustment of the mA and/or kV according to patient size and/or use of iterative reconstruction technique. COMPARISON:  12/17/2018 abdominal CT FINDINGS: CT CHEST FINDINGS Cardiovascular: Cardiomegaly. Dual-chamber pacer from the left. Atherosclerosis of the aorta and coronaries. No evidence of great vessel injury. Mediastinum/Nodes: No pneumomediastinum or hematoma. Lungs/Pleura: No hemothorax, pneumothorax, or lung contusion. No suspicious nodule or incidental pneumonia generalized bridging thoracic osteophytes. No acute fracture or subluxation. Musculoskeletal: Subcutaneous high-density in the posterior midline consistent with hematoma, overlying the spinous processes of T10 and T11, measuring up to 6 cm in length by 2 cm in anterior to posterior thickness. Spine is diffusely ankylosed from bridging osteophytes except open  appearance seen at T1-2, T2-3, T9-10 (with bulky ventral spurring and vacuum phenomenon in the disc space), L3-4, and L5-S1. Posterior-lateral fusion at L4-5. No acute fracture noted.Symmetric gynecomastia. CT ABDOMEN PELVIS FINDINGS Hepatobiliary: No hepatic injury or perihepatic hematoma. Gallbladder is surgically absent Pancreas: Negative Spleen: No splenic injury or perisplenic hematoma. Adrenals/Urinary Tract: No adrenal hemorrhage or renal injury identified. Bladder is unremarkable. Renal lesions bilaterally, the largest measuring cystic density but also some higher density smoothly contoured lesions, proteinaceous/hemorrhagic cysts also seen by comparison CT with no worrisome change. These high-density nodules on both sides measure up to 18 mm. No specific imaging follow-up recommended given the relative stability and demographics. Layering bladder calculi measuring up to 9 mm. Stomach/Bowel: No evidence of injury. Vascular/Lymphatic: Ordinary atheromatous calcification Reproductive: No acute finding Other: No ascites or pneumoperitoneum Musculoskeletal: Lumbar fusion as described above. No acute fracture or subluxation. IMPRESSION: 1. 6 cm subcutaneous hematoma overlying the spinous processes of T10 and T11.  2. No evidence of intrathoracic or intra-abdominal injury. 3. Chronic findings are described above. Electronically Signed   By: Jorje Guild M.D.   On: 01/31/2022 04:57   CT CERVICAL SPINE WO CONTRAST  Result Date: 01/31/2022 CLINICAL DATA:  Fall with head laceration EXAM: CT HEAD WITHOUT CONTRAST CT CERVICAL SPINE WITHOUT CONTRAST TECHNIQUE: Multidetector CT imaging of the head and cervical spine was performed following the standard protocol without intravenous contrast. Multiplanar CT image reconstructions of the cervical spine were also generated. RADIATION DOSE REDUCTION: This exam was performed according to the departmental dose-optimization program which includes automated exposure control,  adjustment of the mA and/or kV according to patient size and/or use of iterative reconstruction technique. COMPARISON:  01/16/2022 FINDINGS: CT HEAD FINDINGS Brain: No evidence of acute infarction, hemorrhage, hydrocephalus, extra-axial collection or mass lesion/mass effect. Generalized cerebral volume loss and confluent chronic small vessel ischemia in the cerebral white matter. Ventriculomegaly is likely related to the atrophy. Vascular: No hyperdense vessel or unexpected calcification. Skull: No acute fracture. Soft tissue wound with gas to the forehead. Sinuses/Orbits: No visible injury CT CERVICAL SPINE FINDINGS Alignment: No traumatic malalignment Skull base and vertebrae: No acute fracture Soft tissues and spinal canal: No prevertebral fluid or swelling. No visible canal hematoma. Disc levels: Bulky degenerative spurring throughout the cervical spine. Upper chest: No visible injury IMPRESSION: No evidence of acute intracranial or cervical spine injury. Electronically Signed   By: Jorje Guild M.D.   On: 01/31/2022 04:45   CT HEAD WO CONTRAST (5MM)  Result Date: 01/31/2022 CLINICAL DATA:  Fall with head laceration EXAM: CT HEAD WITHOUT CONTRAST CT CERVICAL SPINE WITHOUT CONTRAST TECHNIQUE: Multidetector CT imaging of the head and cervical spine was performed following the standard protocol without intravenous contrast. Multiplanar CT image reconstructions of the cervical spine were also generated. RADIATION DOSE REDUCTION: This exam was performed according to the departmental dose-optimization program which includes automated exposure control, adjustment of the mA and/or kV according to patient size and/or use of iterative reconstruction technique. COMPARISON:  01/16/2022 FINDINGS: CT HEAD FINDINGS Brain: No evidence of acute infarction, hemorrhage, hydrocephalus, extra-axial collection or mass lesion/mass effect. Generalized cerebral volume loss and confluent chronic small vessel ischemia in the  cerebral white matter. Ventriculomegaly is likely related to the atrophy. Vascular: No hyperdense vessel or unexpected calcification. Skull: No acute fracture. Soft tissue wound with gas to the forehead. Sinuses/Orbits: No visible injury CT CERVICAL SPINE FINDINGS Alignment: No traumatic malalignment Skull base and vertebrae: No acute fracture Soft tissues and spinal canal: No prevertebral fluid or swelling. No visible canal hematoma. Disc levels: Bulky degenerative spurring throughout the cervical spine. Upper chest: No visible injury IMPRESSION: No evidence of acute intracranial or cervical spine injury. Electronically Signed   By: Jorje Guild M.D.   On: 01/31/2022 04:45   DG Chest Port 1 View  Result Date: 01/31/2022 CLINICAL DATA:  Fall. EXAM: PORTABLE CHEST 1 VIEW COMPARISON:  01/16/2022. FINDINGS: The heart is enlarged and the mediastinal contour is within normal limits. There is atherosclerotic calcification of the aorta. A dual lead pacemaker is present over the left chest. No consolidation, effusion, or pneumothorax. No acute osseous abnormality. IMPRESSION: No active disease. Electronically Signed   By: Brett Fairy M.D.   On: 01/31/2022 04:12   CT Cervical Spine Wo Contrast  Result Date: 01/16/2022 CLINICAL DATA:  Trauma, fall EXAM: CT CERVICAL SPINE WITHOUT CONTRAST TECHNIQUE: Multidetector CT imaging of the cervical spine was performed without intravenous contrast. Multiplanar CT image reconstructions were  also generated. RADIATION DOSE REDUCTION: This exam was performed according to the departmental dose-optimization program which includes automated exposure control, adjustment of the mA and/or kV according to patient size and/or use of iterative reconstruction technique. COMPARISON:  04/07/2021 FINDINGS: Alignment: There is minimal anterolisthesis at C3-C4 level with no significant interval change. There is minimal levoscoliosis. Skull base and vertebrae: No recent fracture is seen.  Degenerative changes are noted at multiple levels in cervical spine and upper thoracic spine. Soft tissues and spinal canal: There is extrinsic pressure over the ventral margin of thecal sac caused by posterior bony spurs more so at C4-C5 and C5-C6 levels. Disc levels: There is encroachment of neural foramina by bony spurs and facet hypertrophy from C2-T3 levels. Upper chest: No acute findings are seen. Other: None. IMPRESSION: No recent fracture is seen in cervical spine. There is minimal anterolisthesis at C3-C4 level which has not changed significantly suggesting remote ligament injury and facet degeneration. There is encroachment of neural foramina at multiple levels in cervical spine and upper thoracic spine. Electronically Signed   By: Elmer Picker M.D.   On: 01/16/2022 12:20   CT Head Wo Contrast  Result Date: 01/16/2022 CLINICAL DATA:  Trauma, fall EXAM: CT HEAD WITHOUT CONTRAST TECHNIQUE: Contiguous axial images were obtained from the base of the skull through the vertex without intravenous contrast. RADIATION DOSE REDUCTION: This exam was performed according to the departmental dose-optimization program which includes automated exposure control, adjustment of the mA and/or kV according to patient size and/or use of iterative reconstruction technique. COMPARISON:  04/07/2021 FINDINGS: Brain: No acute intracranial findings are seen. There are no signs of bleeding within the cranium. Cortical sulci are prominent. There is prominence of third and both lateral ventricles. There is decreased density in periventricular and subcortical white matter. Vascular: Unremarkable. Skull: No fracture is seen in calvarium. Sinuses/Orbits: There is mild mucosal thickening in ethmoid and left maxillary sinuses. Other: None. IMPRESSION: No acute intracranial findings are seen. Central and cortical atrophy. Small-vessel disease. Electronically Signed   By: Elmer Picker M.D.   On: 01/16/2022 12:12   DG Chest  Port 1 View  Result Date: 01/16/2022 CLINICAL DATA:  Weakness. EXAM: PORTABLE CHEST 1 VIEW COMPARISON:  Chest x-ray dated April 27, 2021. FINDINGS: Unchanged left chest wall pacemaker. Unchanged mild cardiomegaly. Normal pulmonary vascularity. No focal consolidation, pleural effusion, or pneumothorax. No acute osseous abnormality. IMPRESSION: No active disease. Electronically Signed   By: Titus Dubin M.D.   On: 01/16/2022 11:37    Microbiology: Results for orders placed or performed during the hospital encounter of 01/31/22  Culture, blood (Routine X 2) w Reflex to ID Panel     Status: None   Collection Time: 01/31/22  3:30 AM   Specimen: BLOOD  Result Value Ref Range Status   Specimen Description BLOOD RIGHT ANTECUBITAL  Final   Special Requests   Final    BOTTLES DRAWN AEROBIC AND ANAEROBIC Blood Culture adequate volume   Culture   Final    NO GROWTH 5 DAYS Performed at [facility], [place] [address], [place], [place] [address]    Report Status 02/05/2022 FINAL  Final  Culture, blood (Routine X 2) w Reflex to ID Panel     Status: None   Collection Time: 01/31/22  3:49 AM   Specimen: BLOOD  Result Value Ref Range Status   Specimen Description BLOOD LEFT ANTECUBITAL  Final   Special Requests   Final    BOTTLES DRAWN AEROBIC AND  ANAEROBIC Blood Culture adequate volume   Culture   Final    NO GROWTH 5 DAYS Performed at [facility], [place] [address], [place], [place] [address]    Report Status 02/05/2022 FINAL  Final  Resp panel by RT-PCR (RSV, Flu A&B, Covid) Anterior Nasal Swab     Status: None   Collection Time: 01/31/22  8:47 AM   Specimen: Anterior Nasal Swab  Result Value Ref Range Status   SARS Coronavirus 2 by RT PCR NEGATIVE NEGATIVE Final    Comment: (NOTE) SARS-CoV-2 target nucleic acids are NOT DETECTED.  The SARS-CoV-2 RNA is generally detectable in upper respiratory specimens during the acute phase of infection. The lowest concentration of SARS-CoV-2  viral copies this assay can detect is 138 copies/mL. A negative result does not preclude SARS-Cov-2 infection and should not be used as the sole basis for treatment or other patient management decisions. A negative result may occur with  improper specimen collection/handling, submission of specimen other than nasopharyngeal swab, presence of viral mutation(s) within the areas targeted by this assay, and inadequate number of viral copies(<138 copies/mL). A negative result must be combined with clinical observations, patient history, and epidemiological information. The expected result is Negative.  Fact Sheet for Patients:  EntrepreneurPulse.com.au  Fact Sheet for Healthcare Providers:  IncredibleEmployment.be  This test is no t yet approved or cleared by the [place] FDA and  has been authorized for detection and/or diagnosis of SARS-CoV-2 by FDA under an Emergency Use Authorization (EUA). This EUA will remain  in effect (meaning this test can be used) for the duration of the COVID-19 declaration under Section 564(b)(1) of the Act, 21 U.S.C.section 360bbb-3(b)(1), unless the authorization is terminated  or revoked sooner.       Influenza A by PCR NEGATIVE NEGATIVE Final   Influenza B by PCR NEGATIVE NEGATIVE Final    Comment: (NOTE) The Xpert Xpress SARS-CoV-2/FLU/RSV plus assay is intended as an aid in the diagnosis of influenza from Nasopharyngeal swab specimens and should not be used as a sole basis for treatment. Nasal washings and aspirates are unacceptable for Xpert Xpress SARS-CoV-2/FLU/RSV testing.  Fact Sheet for Patients: EntrepreneurPulse.com.au  Fact Sheet for Healthcare Providers: IncredibleEmployment.be  This test is not yet approved or cleared by the [place] FDA and has been authorized for detection and/or diagnosis of SARS-CoV-2 by FDA under an Emergency Use Authorization  (EUA). This EUA will remain in effect (meaning this test can be used) for the duration of the COVID-19 declaration under Section 564(b)(1) of the Act, 21 U.S.C. section 360bbb-3(b)(1), unless the authorization is terminated or revoked.     Resp Syncytial Virus by PCR NEGATIVE NEGATIVE Final    Comment: (NOTE) Fact Sheet for Patients: EntrepreneurPulse.com.au  Fact Sheet for Healthcare Providers: IncredibleEmployment.be  This test is not yet approved or cleared by the [place] FDA and has been authorized for detection and/or diagnosis of SARS-CoV-2 by FDA under an Emergency Use Authorization (EUA). This EUA will remain in effect (meaning this test can be used) for the duration of the COVID-19 declaration under Section 564(b)(1) of the Act, 21 U.S.C. section 360bbb-3(b)(1), unless the authorization is terminated or revoked.  Performed at [facility], [place] [address], [place], [place] [address]     Labs: CBC: Recent Labs  Lab 02/01/22 0915 02/02/22 0610 02/03/22 0354 02/04/22 0705 02/04/22 2000 02/05/22 0343  WBC 8.2 6.6 5.6 5.0  --  5.2  HGB 8.0* 7.6* 7.2* 7.6* 9.1* 8.6*  HCT 24.0* 23.4* 22.0* 23.3* 28.0* 25.6*  MCV 80.5 83.3 82.1 82.0  --  80.5  PLT 114* 114* 125* 141*  --  929   Basic Metabolic Panel: Recent Labs  Lab 02/01/22 0915 02/02/22 0610 02/03/22 0354 02/04/22 0705 02/05/22 0343  NA 139 138 137 136 137  K 3.8 3.6 3.9 4.0 3.8  CL 107 107 109 107 104  CO2 '26 22 24 25 23  '$ GLUCOSE 162* 107* 109* 85 104*  BUN '23 18 17 18 19  '$ CREATININE 1.62* 1.35* 1.26* 1.28* 1.20  CALCIUM 8.1* 8.0* 7.7* 7.9* 8.1*  MG  --  1.4* 2.1  --   --    Liver Function Tests: Recent Labs  Lab 01/31/22 0330 02/01/22 0915  AST 21 69*  ALT 15 21  ALKPHOS 84 74  BILITOT 1.7* 1.9*  PROT 5.1* 4.7*  ALBUMIN 3.3* 2.9*   CBG: No results for input(s): "GLUCAP" in the last 168 hours.  Discharge time spent: greater than 30  minutes.  Signed: Elmarie Shiley, MD [facility] 02/06/2022

## 2022-02-06 NOTE — TOC Progression Note (Signed)
Transition of Care [facility]) - Initial/Assessment Note    Patient Details  Name: Brandon Fischer MRN: [id number] Date of Birth: September 28, 1933  Transition of Care [facility]) CM/SW Contact:    Milinda Antis, LCSWA Phone Number: 02/06/2022, 10:53 AM  Clinical Narrative:                 LCSW provided updates to both the patient's daughter and admissions at [facility].  TOC following.  Expected Discharge Plan: [place] Barriers to Discharge: Continued Medical Work up   Patient Goals and CMS Choice Patient states their goals for this hospitalization and ongoing recovery are:: return to his home with family support CMS Medicare.gov Compare Post Acute Care list provided to:: Patient Choice offered to / list presented to : Patient, Adult Children      Expected Discharge Plan and Services   Discharge Planning Services: CM Consult Post Acute Care Choice: [place] arrangements for the past 2 months: Single Family Home                                      Prior Living Arrangements/Services Living arrangements for the past 2 months: Single Family Home Lives with:: Self Patient language and need for interpreter reviewed:: Yes Do you feel safe going back to the place where you live?: Yes      Need for Family Participation in Patient Care: Yes (Comment) Care giver support system in place?: Yes (comment) Current home services: DME (RW, rollator, shower chair) Criminal Activity/Legal Involvement Pertinent to Current Situation/Hospitalization: No - Comment as needed  Activities of Daily Living Home Assistive Devices/Equipment: Dentures (specify type), Eyeglasses, Hearing aid, Shower chair with back, Walker (specify type) ADL Screening (condition at time of admission) Patient's cognitive ability adequate to safely complete daily activities?: No Is the patient deaf or have difficulty hearing?: Yes Does the patient have difficulty seeing, even when  wearing glasses/contacts?: No Does the patient have difficulty concentrating, remembering, or making decisions?: Yes Patient able to express need for assistance with ADLs?: Yes Does the patient have difficulty dressing or bathing?: Yes Independently performs ADLs?: No Communication: Needs assistance (expressive aphasia - answers "yes" to 75% of questions) Dressing (OT): Needs assistance Is this a change from baseline?: Pre-admission baseline Grooming: Dependent Is this a change from baseline?: Pre-admission baseline Feeding: Needs assistance Is this a change from baseline?: Change from baseline, expected to last <3 days Bathing: Needs assistance Is this a change from baseline?: Change from baseline, expected to last >3 days Toileting: Needs assistance Is this a change from baseline?: Change from baseline, expected to last >3days In/Out Bed: Needs assistance Is this a change from baseline?: Change from baseline, expected to last >3 days Walks in Home: Independent with device (comment) Is this a change from baseline?: Pre-admission baseline Does the patient have difficulty walking or climbing stairs?: Yes Weakness of Legs: Both Weakness of Arms/Hands: Both  Permission Sought/Granted Permission sought to share information with : Family Supports          Permission granted to share info w Relationship: daughter     Emotional Assessment Appearance:: Appears stated age Attitude/Demeanor/Rapport: Engaged Affect (typically observed): Accepting Orientation: : Oriented to Self, Oriented to Place, Oriented to  Time, Oriented to Situation Alcohol / Substance Use: Not Applicable Psych Involvement: No (comment)  Admission diagnosis:  Hypothermia [T68.XXXA] Fall [W19.XXXA] Hypothermia, initial encounter [T68.XXXA] Fall, initial  encounter [id number]] Acute on chronic renal failure ([place]) [N17.9, N18.9] Patient Active Problem List   Diagnosis Date Noted   Acute on chronic renal failure  ([place]) 02/01/2022   Hypothermia 01/31/2022   Fall 01/31/2022   SIRS (systemic inflammatory response syndrome) ([place]) 01/31/2022   Hematoma 01/31/2022   Lactic acidosis 01/31/2022   Acute blood loss anemia 01/31/2022   Transient hypotension 01/31/2022   Acute renal failure superimposed on stage 3b chronic kidney disease ([place]) 01/31/2022   Right knee pain 01/31/2022   Hyperglycemia 01/31/2022   Memory loss 01/31/2022   BPH (benign prostatic hyperplasia) 01/31/2022   GERD (gastroesophageal reflux disease) 01/31/2022   Pacemaker: [employer] Dual [place] DR-RF - [id number] 04/26/2021 04/26/2021   Encounter for care of pacemaker 04/26/2021   Complete heart block ([place]) 04/25/2021   Spinal stenosis at L4-L5 level 07/15/2018   S/P radiofrequency ablation operation for arrhythmia-SVT 04/28/2014   SVT (supraventricular tachycardia) 03/15/2014   Nephrolithiasis 01/17/2011   Hypertension    Dyslipidemia    Palpitation    PCP:  Deland Pretty, MD Pharmacy:   [place], [place] - [address] [place] [address] Phone: [phone] Fax: [phone]  [id number] [address]Phone: [phone]: [phone]    Social Determinants of Health (SDOH) Social History: SDOH Screenings   Food Insecurity: No Food Insecurity (02/01/2022)  Housing: Low Risk  (02/01/2022)  Transportation Needs: No Transportation Needs (02/01/2022)  Utilities: Not At Risk (02/01/2022)  Tobacco Use: Medium Risk (01/31/2022)   SDOH Interventions:     Readmission Risk Interventions     No data to display

## 2022-02-06 NOTE — ED Triage Notes (Signed)
Pt arrived via [facility] for fall from [facility]. Staff called EMS after pt had unwitnessed fall from chair. Pt at cognitive baseline, A&Ox2, GCS 14, answers yes/no. Seen for fall this week and received stitches to forehead. EMS report previous laceration was reopened during fall tonight. Bleeding controled. Ccollar placed for precaution. Pt denies pain.   BP 150/80 HR 82 SPO2 98% RR  18 [id number]

## 2022-02-07 ENCOUNTER — Other Ambulatory Visit (HOSPITAL_COMMUNITY): Payer: Medicare Other

## 2022-02-07 ENCOUNTER — Emergency Department (HOSPITAL_COMMUNITY): Payer: Medicare Other

## 2022-02-07 DIAGNOSIS — R4701 Aphasia: Secondary | ICD-10-CM | POA: Diagnosis not present

## 2022-02-07 DIAGNOSIS — Z7409 Other reduced mobility: Secondary | ICD-10-CM | POA: Diagnosis not present

## 2022-02-07 DIAGNOSIS — W19XXXA Unspecified fall, initial encounter: Secondary | ICD-10-CM | POA: Diagnosis not present

## 2022-02-07 DIAGNOSIS — J1282 Pneumonia due to coronavirus disease 2019: Secondary | ICD-10-CM | POA: Diagnosis not present

## 2022-02-07 DIAGNOSIS — R7989 Other specified abnormal findings of blood chemistry: Secondary | ICD-10-CM | POA: Diagnosis not present

## 2022-02-07 DIAGNOSIS — M6281 Muscle weakness (generalized): Secondary | ICD-10-CM | POA: Diagnosis not present

## 2022-02-07 DIAGNOSIS — R531 Weakness: Secondary | ICD-10-CM | POA: Diagnosis not present

## 2022-02-07 DIAGNOSIS — N179 Acute kidney failure, unspecified: Secondary | ICD-10-CM | POA: Diagnosis not present

## 2022-02-07 DIAGNOSIS — R41841 Cognitive communication deficit: Secondary | ICD-10-CM | POA: Diagnosis not present

## 2022-02-07 DIAGNOSIS — I10 Essential (primary) hypertension: Secondary | ICD-10-CM | POA: Diagnosis not present

## 2022-02-07 DIAGNOSIS — Z4802 Encounter for removal of sutures: Secondary | ICD-10-CM | POA: Diagnosis not present

## 2022-02-07 DIAGNOSIS — Z7982 Long term (current) use of aspirin: Secondary | ICD-10-CM | POA: Diagnosis not present

## 2022-02-07 DIAGNOSIS — Z9181 History of falling: Secondary | ICD-10-CM | POA: Diagnosis not present

## 2022-02-07 DIAGNOSIS — S199XXA Unspecified injury of neck, initial encounter: Secondary | ICD-10-CM | POA: Diagnosis not present

## 2022-02-07 DIAGNOSIS — H04123 Dry eye syndrome of bilateral lacrimal glands: Secondary | ICD-10-CM | POA: Diagnosis not present

## 2022-02-07 DIAGNOSIS — M1711 Unilateral primary osteoarthritis, right knee: Secondary | ICD-10-CM | POA: Diagnosis not present

## 2022-02-07 DIAGNOSIS — T1490XA Injury, unspecified, initial encounter: Secondary | ICD-10-CM | POA: Diagnosis not present

## 2022-02-07 DIAGNOSIS — F419 Anxiety disorder, unspecified: Secondary | ICD-10-CM | POA: Diagnosis not present

## 2022-02-07 DIAGNOSIS — Y92129 Unspecified place in nursing home as the place of occurrence of the external cause: Secondary | ICD-10-CM | POA: Diagnosis not present

## 2022-02-07 DIAGNOSIS — Z950 Presence of cardiac pacemaker: Secondary | ICD-10-CM | POA: Diagnosis not present

## 2022-02-07 DIAGNOSIS — N183 Chronic kidney disease, stage 3 unspecified: Secondary | ICD-10-CM | POA: Diagnosis not present

## 2022-02-07 DIAGNOSIS — R269 Unspecified abnormalities of gait and mobility: Secondary | ICD-10-CM | POA: Diagnosis not present

## 2022-02-07 DIAGNOSIS — R5381 Other malaise: Secondary | ICD-10-CM | POA: Diagnosis not present

## 2022-02-07 DIAGNOSIS — M48061 Spinal stenosis, lumbar region without neurogenic claudication: Secondary | ICD-10-CM | POA: Diagnosis not present

## 2022-02-07 DIAGNOSIS — T68XXXD Hypothermia, subsequent encounter: Secondary | ICD-10-CM | POA: Diagnosis not present

## 2022-02-07 DIAGNOSIS — I6523 Occlusion and stenosis of bilateral carotid arteries: Secondary | ICD-10-CM | POA: Diagnosis not present

## 2022-02-07 DIAGNOSIS — N400 Enlarged prostate without lower urinary tract symptoms: Secondary | ICD-10-CM | POA: Diagnosis not present

## 2022-02-07 DIAGNOSIS — R7889 Finding of other specified substances, not normally found in blood: Secondary | ICD-10-CM | POA: Diagnosis not present

## 2022-02-07 DIAGNOSIS — R1312 Dysphagia, oropharyngeal phase: Secondary | ICD-10-CM | POA: Diagnosis not present

## 2022-02-07 DIAGNOSIS — E785 Hyperlipidemia, unspecified: Secondary | ICD-10-CM | POA: Diagnosis not present

## 2022-02-07 DIAGNOSIS — I442 Atrioventricular block, complete: Secondary | ICD-10-CM | POA: Diagnosis not present

## 2022-02-07 DIAGNOSIS — S0181XA Laceration without foreign body of other part of head, initial encounter: Secondary | ICD-10-CM | POA: Diagnosis not present

## 2022-02-07 DIAGNOSIS — I2510 Atherosclerotic heart disease of native coronary artery without angina pectoris: Secondary | ICD-10-CM | POA: Diagnosis not present

## 2022-02-07 DIAGNOSIS — Z043 Encounter for examination and observation following other accident: Secondary | ICD-10-CM | POA: Diagnosis not present

## 2022-02-07 DIAGNOSIS — R946 Abnormal results of thyroid function studies: Secondary | ICD-10-CM | POA: Diagnosis not present

## 2022-02-07 DIAGNOSIS — F0390 Unspecified dementia without behavioral disturbance: Secondary | ICD-10-CM | POA: Diagnosis not present

## 2022-02-07 DIAGNOSIS — J181 Lobar pneumonia, unspecified organism: Secondary | ICD-10-CM | POA: Diagnosis not present

## 2022-02-07 DIAGNOSIS — D649 Anemia, unspecified: Secondary | ICD-10-CM | POA: Diagnosis not present

## 2022-02-07 DIAGNOSIS — M25561 Pain in right knee: Secondary | ICD-10-CM | POA: Diagnosis not present

## 2022-02-07 DIAGNOSIS — M7989 Other specified soft tissue disorders: Secondary | ICD-10-CM | POA: Diagnosis not present

## 2022-02-07 DIAGNOSIS — Z79899 Other long term (current) drug therapy: Secondary | ICD-10-CM | POA: Diagnosis not present

## 2022-02-07 DIAGNOSIS — R404 Transient alteration of awareness: Secondary | ICD-10-CM | POA: Diagnosis not present

## 2022-02-07 DIAGNOSIS — R2681 Unsteadiness on feet: Secondary | ICD-10-CM | POA: Diagnosis not present

## 2022-02-07 DIAGNOSIS — E559 Vitamin D deficiency, unspecified: Secondary | ICD-10-CM | POA: Diagnosis not present

## 2022-02-07 DIAGNOSIS — Z45018 Encounter for adjustment and management of other part of cardiac pacemaker: Secondary | ICD-10-CM | POA: Diagnosis not present

## 2022-02-07 DIAGNOSIS — R600 Localized edema: Secondary | ICD-10-CM | POA: Diagnosis not present

## 2022-02-07 DIAGNOSIS — U071 COVID-19: Secondary | ICD-10-CM | POA: Diagnosis not present

## 2022-02-07 DIAGNOSIS — F03B0 Unspecified dementia, moderate, without behavioral disturbance, psychotic disturbance, mood disturbance, and anxiety: Secondary | ICD-10-CM | POA: Diagnosis not present

## 2022-02-07 DIAGNOSIS — R220 Localized swelling, mass and lump, head: Secondary | ICD-10-CM | POA: Diagnosis not present

## 2022-02-07 DIAGNOSIS — M6259 Muscle wasting and atrophy, not elsewhere classified, multiple sites: Secondary | ICD-10-CM | POA: Diagnosis not present

## 2022-02-07 DIAGNOSIS — R296 Repeated falls: Secondary | ICD-10-CM | POA: Diagnosis not present

## 2022-02-07 DIAGNOSIS — R4182 Altered mental status, unspecified: Secondary | ICD-10-CM | POA: Diagnosis not present

## 2022-02-07 DIAGNOSIS — I129 Hypertensive chronic kidney disease with stage 1 through stage 4 chronic kidney disease, or unspecified chronic kidney disease: Secondary | ICD-10-CM | POA: Diagnosis not present

## 2022-02-07 DIAGNOSIS — D62 Acute posthemorrhagic anemia: Secondary | ICD-10-CM | POA: Diagnosis not present

## 2022-02-07 DIAGNOSIS — K219 Gastro-esophageal reflux disease without esophagitis: Secondary | ICD-10-CM | POA: Diagnosis not present

## 2022-02-07 DIAGNOSIS — R0602 Shortness of breath: Secondary | ICD-10-CM | POA: Diagnosis not present

## 2022-02-07 DIAGNOSIS — S0181XD Laceration without foreign body of other part of head, subsequent encounter: Secondary | ICD-10-CM | POA: Diagnosis not present

## 2022-02-07 DIAGNOSIS — Z7401 Bed confinement status: Secondary | ICD-10-CM | POA: Diagnosis not present

## 2022-02-07 DIAGNOSIS — J3489 Other specified disorders of nose and nasal sinuses: Secondary | ICD-10-CM | POA: Diagnosis not present

## 2022-02-07 DIAGNOSIS — J189 Pneumonia, unspecified organism: Secondary | ICD-10-CM | POA: Diagnosis not present

## 2022-02-07 DIAGNOSIS — W19XXXD Unspecified fall, subsequent encounter: Secondary | ICD-10-CM | POA: Diagnosis not present

## 2022-02-07 DIAGNOSIS — I959 Hypotension, unspecified: Secondary | ICD-10-CM | POA: Diagnosis not present

## 2022-02-07 DIAGNOSIS — R739 Hyperglycemia, unspecified: Secondary | ICD-10-CM | POA: Diagnosis not present

## 2022-02-07 DIAGNOSIS — R410 Disorientation, unspecified: Secondary | ICD-10-CM | POA: Diagnosis not present

## 2022-02-07 DIAGNOSIS — I480 Paroxysmal atrial fibrillation: Secondary | ICD-10-CM | POA: Diagnosis not present

## 2022-02-07 DIAGNOSIS — R059 Cough, unspecified: Secondary | ICD-10-CM | POA: Diagnosis present

## 2022-02-07 DIAGNOSIS — N189 Chronic kidney disease, unspecified: Secondary | ICD-10-CM | POA: Diagnosis not present

## 2022-02-07 LAB — CBC WITH DIFFERENTIAL/PLATELET
Abs Immature Granulocytes: 0.04 10*3/uL (ref 0.00–0.07)
Basophils Absolute: 0 10*3/uL (ref 0.0–0.1)
Basophils Relative: 0 %
Eosinophils Absolute: 0 10*3/uL (ref 0.0–0.5)
Eosinophils Relative: 0 %
HCT: 28.8 % — ABNORMAL LOW (ref 39.0–52.0)
Hemoglobin: 9.7 g/dL — ABNORMAL LOW (ref 13.0–17.0)
Immature Granulocytes: 0 %
Lymphocytes Relative: 6 %
Lymphs Abs: 0.5 10*3/uL — ABNORMAL LOW (ref 0.7–4.0)
MCH: 27.2 pg (ref 26.0–34.0)
MCHC: 33.7 g/dL (ref 30.0–36.0)
MCV: 80.7 fL (ref 80.0–100.0)
Monocytes Absolute: 1.1 10*3/uL — ABNORMAL HIGH (ref 0.1–1.0)
Monocytes Relative: 13 %
Neutro Abs: 7.3 10*3/uL (ref 1.7–7.7)
Neutrophils Relative %: 81 %
Platelets: 201 10*3/uL (ref 150–400)
RBC: 3.57 MIL/uL — ABNORMAL LOW (ref 4.22–5.81)
RDW: 16.5 % — ABNORMAL HIGH (ref 11.5–15.5)
WBC: 9 10*3/uL (ref 4.0–10.5)
nRBC: 0 % (ref 0.0–0.2)

## 2022-02-07 LAB — BASIC METABOLIC PANEL
Anion gap: 7 (ref 5–15)
BUN: 24 mg/dL — ABNORMAL HIGH (ref 8–23)
CO2: 25 mmol/L (ref 22–32)
Calcium: 8.4 mg/dL — ABNORMAL LOW (ref 8.9–10.3)
Chloride: 104 mmol/L (ref 98–111)
Creatinine, Ser: 1.26 mg/dL — ABNORMAL HIGH (ref 0.61–1.24)
GFR, Estimated: 56 mL/min — ABNORMAL LOW (ref >60)
Glucose, Bld: 145 mg/dL — ABNORMAL HIGH (ref 70–99)
Potassium: 4.2 mmol/L (ref 3.5–5.1)
Sodium: 136 mmol/L (ref 135–145)

## 2022-02-07 MED ORDER — LIDOCAINE-EPINEPHRINE (PF) 2 %-1:200000 IJ SOLN
20.0000 mL | Freq: Once | INTRAMUSCULAR | Status: DC
  Filled 2022-02-07: qty 20

## 2022-02-07 NOTE — ED Notes (Signed)
Facial laceration cleaned and irrigated. Suture cart and lido at bedside.

## 2022-02-07 NOTE — ED Notes (Signed)
Ccollar removed with clearance from Dr. Leonette Monarch

## 2022-02-10 DIAGNOSIS — K219 Gastro-esophageal reflux disease without esophagitis: Secondary | ICD-10-CM | POA: Diagnosis not present

## 2022-02-10 DIAGNOSIS — R5381 Other malaise: Secondary | ICD-10-CM | POA: Diagnosis not present

## 2022-02-10 DIAGNOSIS — I2510 Atherosclerotic heart disease of native coronary artery without angina pectoris: Secondary | ICD-10-CM | POA: Diagnosis not present

## 2022-02-10 DIAGNOSIS — I480 Paroxysmal atrial fibrillation: Secondary | ICD-10-CM | POA: Diagnosis not present

## 2022-02-10 DIAGNOSIS — E785 Hyperlipidemia, unspecified: Secondary | ICD-10-CM | POA: Diagnosis not present

## 2022-02-10 DIAGNOSIS — I10 Essential (primary) hypertension: Secondary | ICD-10-CM | POA: Diagnosis not present

## 2022-02-10 DIAGNOSIS — I442 Atrioventricular block, complete: Secondary | ICD-10-CM | POA: Diagnosis not present

## 2022-02-10 DIAGNOSIS — N189 Chronic kidney disease, unspecified: Secondary | ICD-10-CM | POA: Diagnosis not present

## 2022-02-10 DIAGNOSIS — N400 Enlarged prostate without lower urinary tract symptoms: Secondary | ICD-10-CM | POA: Diagnosis not present

## 2022-02-10 DIAGNOSIS — R4701 Aphasia: Secondary | ICD-10-CM | POA: Diagnosis not present

## 2022-02-10 DIAGNOSIS — N179 Acute kidney failure, unspecified: Secondary | ICD-10-CM | POA: Diagnosis not present

## 2022-02-10 DIAGNOSIS — D62 Acute posthemorrhagic anemia: Secondary | ICD-10-CM | POA: Diagnosis not present

## 2022-02-13 DIAGNOSIS — I480 Paroxysmal atrial fibrillation: Secondary | ICD-10-CM | POA: Diagnosis not present

## 2022-02-13 DIAGNOSIS — E785 Hyperlipidemia, unspecified: Secondary | ICD-10-CM | POA: Diagnosis not present

## 2022-02-13 DIAGNOSIS — K219 Gastro-esophageal reflux disease without esophagitis: Secondary | ICD-10-CM | POA: Diagnosis not present

## 2022-02-13 DIAGNOSIS — I442 Atrioventricular block, complete: Secondary | ICD-10-CM | POA: Diagnosis not present

## 2022-02-13 DIAGNOSIS — N189 Chronic kidney disease, unspecified: Secondary | ICD-10-CM | POA: Diagnosis not present

## 2022-02-13 DIAGNOSIS — D62 Acute posthemorrhagic anemia: Secondary | ICD-10-CM | POA: Diagnosis not present

## 2022-02-13 DIAGNOSIS — I10 Essential (primary) hypertension: Secondary | ICD-10-CM | POA: Diagnosis not present

## 2022-02-13 DIAGNOSIS — R5381 Other malaise: Secondary | ICD-10-CM | POA: Diagnosis not present

## 2022-02-13 DIAGNOSIS — N179 Acute kidney failure, unspecified: Secondary | ICD-10-CM | POA: Diagnosis not present

## 2022-02-13 DIAGNOSIS — R4701 Aphasia: Secondary | ICD-10-CM | POA: Diagnosis not present

## 2022-02-13 DIAGNOSIS — N400 Enlarged prostate without lower urinary tract symptoms: Secondary | ICD-10-CM | POA: Diagnosis not present

## 2022-02-13 DIAGNOSIS — I2510 Atherosclerotic heart disease of native coronary artery without angina pectoris: Secondary | ICD-10-CM | POA: Diagnosis not present

## 2022-02-17 DIAGNOSIS — R4701 Aphasia: Secondary | ICD-10-CM | POA: Diagnosis not present

## 2022-02-17 DIAGNOSIS — N179 Acute kidney failure, unspecified: Secondary | ICD-10-CM | POA: Diagnosis not present

## 2022-02-17 DIAGNOSIS — I2510 Atherosclerotic heart disease of native coronary artery without angina pectoris: Secondary | ICD-10-CM | POA: Diagnosis not present

## 2022-02-17 DIAGNOSIS — K219 Gastro-esophageal reflux disease without esophagitis: Secondary | ICD-10-CM | POA: Diagnosis not present

## 2022-02-17 DIAGNOSIS — E785 Hyperlipidemia, unspecified: Secondary | ICD-10-CM | POA: Diagnosis not present

## 2022-02-17 DIAGNOSIS — I442 Atrioventricular block, complete: Secondary | ICD-10-CM | POA: Diagnosis not present

## 2022-02-17 DIAGNOSIS — N400 Enlarged prostate without lower urinary tract symptoms: Secondary | ICD-10-CM | POA: Diagnosis not present

## 2022-02-17 DIAGNOSIS — R5381 Other malaise: Secondary | ICD-10-CM | POA: Diagnosis not present

## 2022-02-17 DIAGNOSIS — D62 Acute posthemorrhagic anemia: Secondary | ICD-10-CM | POA: Diagnosis not present

## 2022-02-17 DIAGNOSIS — N189 Chronic kidney disease, unspecified: Secondary | ICD-10-CM | POA: Diagnosis not present

## 2022-02-17 DIAGNOSIS — F0390 Unspecified dementia without behavioral disturbance: Secondary | ICD-10-CM | POA: Diagnosis not present

## 2022-02-17 DIAGNOSIS — I10 Essential (primary) hypertension: Secondary | ICD-10-CM | POA: Diagnosis not present

## 2022-02-24 DIAGNOSIS — R4701 Aphasia: Secondary | ICD-10-CM | POA: Diagnosis not present

## 2022-02-24 DIAGNOSIS — E785 Hyperlipidemia, unspecified: Secondary | ICD-10-CM | POA: Diagnosis not present

## 2022-02-24 DIAGNOSIS — F0390 Unspecified dementia without behavioral disturbance: Secondary | ICD-10-CM | POA: Diagnosis not present

## 2022-02-24 DIAGNOSIS — I442 Atrioventricular block, complete: Secondary | ICD-10-CM | POA: Diagnosis not present

## 2022-02-24 DIAGNOSIS — I10 Essential (primary) hypertension: Secondary | ICD-10-CM | POA: Diagnosis not present

## 2022-02-24 DIAGNOSIS — I2510 Atherosclerotic heart disease of native coronary artery without angina pectoris: Secondary | ICD-10-CM | POA: Diagnosis not present

## 2022-02-24 DIAGNOSIS — I480 Paroxysmal atrial fibrillation: Secondary | ICD-10-CM | POA: Diagnosis not present

## 2022-02-24 DIAGNOSIS — R5381 Other malaise: Secondary | ICD-10-CM | POA: Diagnosis not present

## 2022-02-24 DIAGNOSIS — K219 Gastro-esophageal reflux disease without esophagitis: Secondary | ICD-10-CM | POA: Diagnosis not present

## 2022-02-24 DIAGNOSIS — E559 Vitamin D deficiency, unspecified: Secondary | ICD-10-CM | POA: Diagnosis not present

## 2022-02-24 DIAGNOSIS — N400 Enlarged prostate without lower urinary tract symptoms: Secondary | ICD-10-CM | POA: Diagnosis not present

## 2022-02-24 DIAGNOSIS — N189 Chronic kidney disease, unspecified: Secondary | ICD-10-CM | POA: Diagnosis not present

## 2022-02-26 ENCOUNTER — Telehealth: Payer: Self-pay

## 2022-02-26 NOTE — Telephone Encounter (Signed)
In- clinic device check.   Called patient to set up device check appointment. NA, LMAM.

## 2022-03-05 DIAGNOSIS — R4701 Aphasia: Secondary | ICD-10-CM | POA: Diagnosis not present

## 2022-03-05 DIAGNOSIS — R5381 Other malaise: Secondary | ICD-10-CM | POA: Diagnosis not present

## 2022-03-05 DIAGNOSIS — F0390 Unspecified dementia without behavioral disturbance: Secondary | ICD-10-CM | POA: Diagnosis not present

## 2022-03-05 DIAGNOSIS — D62 Acute posthemorrhagic anemia: Secondary | ICD-10-CM | POA: Diagnosis not present

## 2022-03-05 DIAGNOSIS — I10 Essential (primary) hypertension: Secondary | ICD-10-CM | POA: Diagnosis not present

## 2022-03-05 DIAGNOSIS — I442 Atrioventricular block, complete: Secondary | ICD-10-CM | POA: Diagnosis not present

## 2022-03-05 DIAGNOSIS — N400 Enlarged prostate without lower urinary tract symptoms: Secondary | ICD-10-CM | POA: Diagnosis not present

## 2022-03-05 DIAGNOSIS — K219 Gastro-esophageal reflux disease without esophagitis: Secondary | ICD-10-CM | POA: Diagnosis not present

## 2022-03-05 DIAGNOSIS — E559 Vitamin D deficiency, unspecified: Secondary | ICD-10-CM | POA: Diagnosis not present

## 2022-03-05 DIAGNOSIS — I480 Paroxysmal atrial fibrillation: Secondary | ICD-10-CM | POA: Diagnosis not present

## 2022-03-05 DIAGNOSIS — E785 Hyperlipidemia, unspecified: Secondary | ICD-10-CM | POA: Diagnosis not present

## 2022-03-05 DIAGNOSIS — I2510 Atherosclerotic heart disease of native coronary artery without angina pectoris: Secondary | ICD-10-CM | POA: Diagnosis not present

## 2022-03-13 DIAGNOSIS — E785 Hyperlipidemia, unspecified: Secondary | ICD-10-CM | POA: Diagnosis not present

## 2022-03-13 DIAGNOSIS — I442 Atrioventricular block, complete: Secondary | ICD-10-CM | POA: Diagnosis not present

## 2022-03-13 DIAGNOSIS — N189 Chronic kidney disease, unspecified: Secondary | ICD-10-CM | POA: Diagnosis not present

## 2022-03-13 DIAGNOSIS — N400 Enlarged prostate without lower urinary tract symptoms: Secondary | ICD-10-CM | POA: Diagnosis not present

## 2022-03-13 DIAGNOSIS — E559 Vitamin D deficiency, unspecified: Secondary | ICD-10-CM | POA: Diagnosis not present

## 2022-03-13 DIAGNOSIS — K219 Gastro-esophageal reflux disease without esophagitis: Secondary | ICD-10-CM | POA: Diagnosis not present

## 2022-03-13 DIAGNOSIS — R4701 Aphasia: Secondary | ICD-10-CM | POA: Diagnosis not present

## 2022-03-13 DIAGNOSIS — R5381 Other malaise: Secondary | ICD-10-CM | POA: Diagnosis not present

## 2022-03-13 DIAGNOSIS — I480 Paroxysmal atrial fibrillation: Secondary | ICD-10-CM | POA: Diagnosis not present

## 2022-03-13 DIAGNOSIS — I10 Essential (primary) hypertension: Secondary | ICD-10-CM | POA: Diagnosis not present

## 2022-03-17 DIAGNOSIS — R4701 Aphasia: Secondary | ICD-10-CM | POA: Diagnosis not present

## 2022-03-17 DIAGNOSIS — R600 Localized edema: Secondary | ICD-10-CM | POA: Diagnosis not present

## 2022-03-17 DIAGNOSIS — K219 Gastro-esophageal reflux disease without esophagitis: Secondary | ICD-10-CM | POA: Diagnosis not present

## 2022-03-17 DIAGNOSIS — E785 Hyperlipidemia, unspecified: Secondary | ICD-10-CM | POA: Diagnosis not present

## 2022-03-17 DIAGNOSIS — E559 Vitamin D deficiency, unspecified: Secondary | ICD-10-CM | POA: Diagnosis not present

## 2022-03-17 DIAGNOSIS — N400 Enlarged prostate without lower urinary tract symptoms: Secondary | ICD-10-CM | POA: Diagnosis not present

## 2022-03-17 DIAGNOSIS — F0390 Unspecified dementia without behavioral disturbance: Secondary | ICD-10-CM | POA: Diagnosis not present

## 2022-03-17 DIAGNOSIS — I480 Paroxysmal atrial fibrillation: Secondary | ICD-10-CM | POA: Diagnosis not present

## 2022-03-17 DIAGNOSIS — R5381 Other malaise: Secondary | ICD-10-CM | POA: Diagnosis not present

## 2022-03-17 DIAGNOSIS — N189 Chronic kidney disease, unspecified: Secondary | ICD-10-CM | POA: Diagnosis not present

## 2022-03-17 DIAGNOSIS — I2510 Atherosclerotic heart disease of native coronary artery without angina pectoris: Secondary | ICD-10-CM | POA: Diagnosis not present

## 2022-03-17 DIAGNOSIS — I442 Atrioventricular block, complete: Secondary | ICD-10-CM | POA: Diagnosis not present

## 2022-03-24 DIAGNOSIS — E785 Hyperlipidemia, unspecified: Secondary | ICD-10-CM | POA: Diagnosis not present

## 2022-03-24 DIAGNOSIS — I2510 Atherosclerotic heart disease of native coronary artery without angina pectoris: Secondary | ICD-10-CM | POA: Diagnosis not present

## 2022-03-24 DIAGNOSIS — D62 Acute posthemorrhagic anemia: Secondary | ICD-10-CM | POA: Diagnosis not present

## 2022-03-24 DIAGNOSIS — R5381 Other malaise: Secondary | ICD-10-CM | POA: Diagnosis not present

## 2022-03-24 DIAGNOSIS — R4701 Aphasia: Secondary | ICD-10-CM | POA: Diagnosis not present

## 2022-03-24 DIAGNOSIS — I442 Atrioventricular block, complete: Secondary | ICD-10-CM | POA: Diagnosis not present

## 2022-03-24 DIAGNOSIS — N400 Enlarged prostate without lower urinary tract symptoms: Secondary | ICD-10-CM | POA: Diagnosis not present

## 2022-03-24 DIAGNOSIS — R600 Localized edema: Secondary | ICD-10-CM | POA: Diagnosis not present

## 2022-03-24 DIAGNOSIS — N189 Chronic kidney disease, unspecified: Secondary | ICD-10-CM | POA: Diagnosis not present

## 2022-03-24 DIAGNOSIS — K219 Gastro-esophageal reflux disease without esophagitis: Secondary | ICD-10-CM | POA: Diagnosis not present

## 2022-03-24 DIAGNOSIS — I10 Essential (primary) hypertension: Secondary | ICD-10-CM | POA: Diagnosis not present

## 2022-03-24 DIAGNOSIS — F0390 Unspecified dementia without behavioral disturbance: Secondary | ICD-10-CM | POA: Diagnosis not present

## 2022-03-31 ENCOUNTER — Ambulatory Visit (INDEPENDENT_AMBULATORY_CARE_PROVIDER_SITE_OTHER): Payer: Medicare Other | Admitting: Diagnostic Neuroimaging

## 2022-03-31 ENCOUNTER — Encounter: Payer: Self-pay | Admitting: Diagnostic Neuroimaging

## 2022-03-31 VITALS — BP 118/57 | HR 67 | Ht 70.0 in | Wt 175.6 lb

## 2022-03-31 DIAGNOSIS — R269 Unspecified abnormalities of gait and mobility: Secondary | ICD-10-CM | POA: Diagnosis not present

## 2022-03-31 DIAGNOSIS — F03B0 Unspecified dementia, moderate, without behavioral disturbance, psychotic disturbance, mood disturbance, and anxiety: Secondary | ICD-10-CM | POA: Diagnosis not present

## 2022-03-31 NOTE — Patient Instructions (Signed)
  MODERATE DEMENTIA (since ~2022) + gait diff / falls (since 2023) - significant cognitive and motor deficits (may also have superimpose right lumbar radiculopathy vs peripheral neuropathy) - safety / supervision issues reviewed; continue supportive care - NEEDS HELP WITH medications, finances; no driving; cannot live alone

## 2022-03-31 NOTE — Progress Notes (Signed)
[facility]  PATIENT: Brandon Fischer DOB: 22-Jul-1933  REFERRING CLINICIAN: Deland Pretty, MD HISTORY FROM: patient and daughter and care giver REASON FOR VISIT: new consult    HISTORICAL  CHIEF COMPLAINT:  Chief Complaint  Patient presents with   Follow-up    Patient in room #7 with his daughter and caregiver. Patient states he is well and stable, no new concerns.    HISTORY OF PRESENT ILLNESS:   UPDATE (03/31/22, VRP): [facility]  Was having more problems with managing at home, increasing dementia, gait and balance difficulty and falls.  Started to have some right leg weakness and dragging sensation in the last few months.  Has been to the emergency room several times.  MRI and CT scan showed no acute findings.  No more falls since living at rehabilitation center with closer supervision.  PRIOR HPI (06/26/21): 89 year old male here for evaluation of gradual onset progressive cognitive decline, memory loss, language difficulty and balance difficulty.  Patient was living at home with his wife until she passed away in 04-21-2019.  She helps take care of him for most of household functions.  He continues to live alone since she passed away.  However family was noticing that he was having more trouble maintaining certain functions at home.  He was starting to repeat himself, having short-term memory loss, language and word finding difficulties.  He had a bad fall in March 2023 and taken to the emergency room.  He was found to have complete heart block treated with pacemaker.  Since that time patient continues to live on his own.  His daughter and son live nearby and checks in on him on a daily basis.  He has a life alert and is able to use a telephone.  Does not drive anymore.  Family helps manage medications.   REVIEW OF SYSTEMS: Full 14 system review of systems performed and negative with exception of: as per  HPI.  ALLERGIES: Allergies  Allergen Reactions   Bupropion     Other reaction(s): disoriented   Olmesartan     Other reaction(s): rash   Trazodone     Other reaction(s): unsteady gait 06/26/21 sometimes takes 1 as needed    HOME MEDICATIONS: Outpatient Medications Prior to Visit  Medication Sig Dispense Refill   Ascorbic Acid (VITAMIN C PO) Take 1 tablet by mouth daily.     aspirin (ASPIRIN CHILDRENS) 81 MG chewable tablet Chew 1 tablet (81 mg total) by mouth daily.     atorvastatin (LIPITOR) 20 MG tablet Take 20 mg by mouth daily.     Cholecalciferol (VITAMIN D-3) 25 MCG (1000 UT) CAPS Take 1,000 Units by mouth daily with breakfast.     metoprolol succinate (TOPROL-XL) 25 MG 24 hr tablet TAKE 1 TABLET (25 MG TOTAL) BY MOUTH DAILY. 90 tablet 3   pantoprazole (PROTONIX) 40 MG tablet Take 40 mg by mouth in the morning and at bedtime.      tamsulosin (FLOMAX) 0.4 MG CAPS capsule Take 0.4 mg by mouth daily after supper.     traMADol (ULTRAM) 50 MG tablet Take 1 tablet (50 mg total) by mouth every 6 (six) hours as needed for moderate pain or severe pain. Post-operatively 15 tablet 0   traZODone (DESYREL) 50 MG tablet Take 50-100 mg by mouth daily as needed.     vitamin B-12 100 MCG tablet Take 1 tablet (100 mcg total) by mouth daily. 30 tablet 0   zinc  gluconate 50 MG tablet Take 50 mg by mouth daily.     diphenhydramine-acetaminophen (TYLENOL PM) 25-500 MG TABS tablet Take 1 tablet by mouth at bedtime as needed (for sleep). (Patient not taking: Reported on 03/31/2022)     Ferrous Sulfate (IRON) 28 MG TABS Take 28 mg by mouth at bedtime. (Patient not taking: Reported on 03/31/2022)     hydrALAZINE (APRESOLINE) 10 MG tablet Take 1 tablet (10 mg total) by mouth 2 (two) times daily. 60 tablet 0   No facility-administered medications prior to visit.    PAST MEDICAL HISTORY: Past Medical History:  Diagnosis Date   Bilateral carotid artery stenosis without cerebral infarction     ASYMPTOMATIC--  BILATERAL ICA  50-69%  PER CARDIOLOGIST NOTE, DR Einar Gip   Bilateral carotid bruits    LEFT  soft bruit, right no no carotid bruit per 07-24-2014 dr Einar Gip note   CKD (chronic kidney disease)    dr foster Narda Amber kidney, ckd stage 3 per lov 02-16-2019   Complete heart block ([place]) [id number]   Complication of anesthesia    1 episode atrial fib 02-09-2014 [facility] and followed up with cardiology, not further issue   Coronary artery disease    NON-OBSTRUCTIVE CAD  per cath 08-06-2004 HX PALPITATIONS-AND -TACHYCARDIA-   Encounter for care of pacemaker 04/26/2021   First degree heart block    Full dentures    GERD (gastroesophageal reflux disease)    Heart palpitations    Not any more   History of atrial fibrillation without current medication    EPISODE OF AFIB WITH RVR INTRAOPERATIVELY 02-09-2014 AT [facility]--  RESOLVED AND PT FOLLOWED UP WITH CARDIOLOGIST  DR Einar Gip   History of kidney stones    Hyperlipidemia    Hypertension    Memory loss    Nephrolithiasis    RIGHT   Organic impotence    Pacemaker: [employer] ASSURITY DR-RF - [id number] 04/26/2021   Psoriasis    Clearing up   PSVT (paroxysmal supraventricular tachycardia)    Right ureteral calculus    S/P radiofrequency ablation operation for arrhythmia-SVT 04/28/2014   Simple renal cyst    right   Wears glasses    Wears hearing aid    BILATERAL    PAST SURGICAL HISTORY: Past Surgical History:  Procedure Laterality Date   CARDIAC CATHETERIZATION  08-06-2004 dr Verlon Setting   (abnormal stress test) Non-obstructive CAD, pLAD 20-30%,  LCX 30-40%, pRCA 20-30%, dRCA 40%, distal LM  mild diffuse calcifation 20-30% tapering stenosis,  preserved LVSF ef 55-60%   CARPAL TUNNEL RELEASE Bilateral 2005   CATARACT EXTRACTION, BILATERAL Bilateral 2011   CHOLECYSTECTOMY     CHOLECYSTECTOMY OPEN  1982   and NEPHROLITHOTOMY   COLONOSCOPY W/ POLYPECTOMY  last one 04-05-2008   CYSTOSCOPY WITH LITHOLAPAXY Right 04/14/2017    Procedure: CYSTOSCOPY WITH LITHOLAPAXY/RIGHT RETROGRADE PLYOGRAM/RIGHT URETEROSCOPY;  Surgeon: [name], MD;  Location: [facility];  Service: Urology;  Laterality: Right;   CYSTOSCOPY WITH RETROGRADE PYELOGRAM, URETEROSCOPY AND STENT PLACEMENT Right 03/14/2014   Procedure: CYSTO/RIGHT RETROGRADE PYELOGRAM/RIGHT URETEROSCOPY/STONE EXTRACTION/STENT PLACEMENT;  Surgeon: Malka So, MD;  Location: [facility];  Service: Urology;  Laterality: Right;   CYSTOSCOPY WITH RETROGRADE PYELOGRAM, URETEROSCOPY AND STENT PLACEMENT Left 04/19/2019   Procedure: CYSTOSCOPY WITH LEFT  RETROGRADE LEFT , URETEROSCOPY WITH HOLMIUM LASER, BASKET EXTRACTION AND STENT PLACEMENT;  Surgeon: [name], MD;  Location: [facility];  Service: Urology;  Laterality: Left;   CYSTOSCOPY WITH RETROGRADE PYELOGRAM, URETEROSCOPY AND  STENT PLACEMENT Left 03/30/2020   Procedure: CYSTOSCOPY WITH RETROGRADE PYELOGRAM, URETEROSCOPY, BASKETTING OF STONE AND STENT PLACEMENT;  Surgeon: Alexis Frock, MD;  Location: [facility];  Service: Urology;  Laterality: Left;   EYE SURGERY Bilateral    Cataracts removed   HOLMIUM LASER APPLICATION Right [id number]   Procedure: RIGHT HOLMIUM LASER APPLICATION;  Surgeon: Malka So, MD;  Location[facility];  Service: Urology;  Laterality: Right;   KNEE ARTHROSCOPY W/ DEBRIDEMENT Right 11/23/2002   and Synovectomy   LUMBAR LAMINECTOMY/DECOMPRESSION MICRODISCECTOMY N/A 07/15/2018   Procedure: Posterior lumbar decompression and fusion L4-5;  Surgeon: Melina Schools, MD;  Location: [place];  Service: Orthopedics;  Laterality: N/A;  4 hrs   NEPHROLITHOTOMY  01/16/2011   Procedure: NEPHROLITHOTOMY PERCUTANEOUS;  Surgeon: Malka So;  Location: [facility];  Service: Urology;  Laterality: Left;  C-Arm  Holmium Laser   NEPHROLITHOTOMY  1981   PACEMAKER IMPLANT N/A 04/26/2021   Procedure: PACEMAKER IMPLANT;  Surgeon: Constance Haw, MD;   Location: [facility];  Service: Cardiovascular;  Laterality: N/A;   SUPRAVENTRICULAR TACHYCARDIA ABLATION N/A 04/28/2014   Procedure: SUPRAVENTRICULAR TACHYCARDIA ABLATION;  Surgeon: Evans Lance, MD;  Location: [facility];  Service: Cardiovascular;  Laterality: N/A;    FAMILY HISTORY: Family History  Problem Relation Age of Onset   Cancer Mother        Ovarian or colon   Heart attack Father    Heart attack Brother     SOCIAL HISTORY: Social History   Socioeconomic History   Marital status: Widowed    Spouse name: Not on file   Number of children: 2   Years of education: Not on file   Highest education level: Some college, no degree  Occupational History   Occupation: postal service     Comment: retired  Tobacco Use   Smoking status: Former    Packs/day: 1.00    Years: 40.00    Additional pack years: 0.00    Total pack years: 40.00    Types: Cigarettes    Quit date: 02/06/1998    Years since quitting: 24.1   Smokeless tobacco: Never  Vaping Use   Vaping Use: Never used  Substance and Sexual Activity   Alcohol use: No   Drug use: No   Sexual activity: Not on file  Other Topics Concern   Not on file  Social History Narrative   06/25/21 lives alone, 2 children are in and out all day, has Medical alert   Social Determinants of Health   Financial Resource Strain: Not on file  Food Insecurity: No Food Insecurity (02/01/2022)   Hunger Vital Sign    Worried About Running Out of Food in the Last Year: Never true    [place] in the Last Year: Never true  Transportation Needs: No Transportation Needs (02/01/2022)   PRAPARE - Hydrologist (Medical): No    Lack of Transportation (Non-Medical): No  Physical Activity: Not on file  Stress: Not on file  Social Connections: Not on file  Intimate Partner Violence: Not At Risk (02/01/2022)   Humiliation, Afraid, Rape, and Kick questionnaire    Fear of Current or Ex-Partner: No     Emotionally Abused: No    Physically Abused: No    Sexually Abused: No     PHYSICAL EXAM  GENERAL EXAM/CONSTITUTIONAL: Vitals:  Vitals:   03/31/22 0932  BP: (!) 118/57  Pulse: 67  Weight: 175 lb 9.6 oz (79.7 kg)  Height: 5\' 10"  (1.778 m)   Body mass index is 25.2 kg/m. Wt Readings from Last 3 Encounters:  03/31/22 175 lb 9.6 oz (79.7 kg)  02/06/22 171 lb 15.3 oz (78 kg)  01/31/22 171 lb 4.8 oz (77.7 kg)   Patient is in no distress; well developed, nourished and groomed; neck is supple  CARDIOVASCULAR: Examination of carotid arteries is normal; no carotid bruits Regular rate and rhythm, no murmurs Examination of peripheral vascular system by observation and palpation is normal  EYES: Ophthalmoscopic exam of optic discs and posterior segments is normal; no papilledema or hemorrhages No results found.  MUSCULOSKELETAL: Gait, strength, tone, movements noted in Neurologic exam below  NEUROLOGIC: MENTAL STATUS:     03/31/2022    9:35 AM 06/26/2021   11:52 AM  MMSE - Mini Mental State Exam  Orientation to time 2 3  Orientation to Place 4 4  Registration 3 3  Attention/ Calculation 1 1  Recall 1 1  Language- name 2 objects 1 2  Language- repeat 1 0  Language- follow 3 step command 2 3  Language- read & follow direction 1 1  Write a sentence 0 0  Write a sentence-comments  I  can't write.  Copy design 0 0  Total score 16 18   awake, alert, oriented to person DECR FLUENCY ("YEAH, NO") DECR ATTENTION SIGNIFICANT MOTOR APRAXIA  CRANIAL NERVE:  2nd - no papilledema on fundoscopic exam 2nd, 3rd, 4th, 6th - pupils equal and reactive to light, visual fields full to confrontation, extraocular muscles intact, no nystagmus 5th - facial sensation symmetric 7th - facial strength symmetric 8th - hearing intact 9th - palate elevates symmetrically, uvula midline 11th - shoulder shrug symmetric 12th - tongue protrusion midline  MOTOR:  normal bulk and tone, full  strength in the BUE RLE 4 prox, 2-3 distal LLE 4-5  SENSORY:  normal and symmetric to light touch, temperature, vibration  COORDINATION:  finger-nose-finger, fine finger movements normal  REFLEXES:  deep tendon reflexes TRACE and symmetric  GAIT/STATION:  IN WHEELCHAIR; UNSTEADY     DIAGNOSTIC DATA (LABS, IMAGING, TESTING) - I reviewed patient records, labs, notes, testing and imaging myself where available.  Lab Results  Component Value Date   WBC 9.0 02/07/2022   HGB 9.7 (L) 02/07/2022   HCT 28.8 (L) 02/07/2022   MCV 80.7 02/07/2022   PLT 201 02/07/2022      Component Value Date/Time   NA 136 02/07/2022 0103   NA 141 12/24/2016 1111   K 4.2 02/07/2022 0103   K 3.9 12/24/2016 1111   CL 104 02/07/2022 0103   CO2 25 02/07/2022 0103   CO2 26 12/24/2016 1111   GLUCOSE 145 (H) 02/07/2022 0103   GLUCOSE 109 12/24/2016 1111   BUN 24 (H) 02/07/2022 0103   BUN 10.9 12/24/2016 1111   CREATININE 1.26 (H) 02/07/2022 0103   CREATININE 1.22 10/21/2021 1235   CREATININE 1.1 12/24/2016 1111   CALCIUM 8.4 (L) 02/07/2022 0103   CALCIUM 8.8 12/24/2016 1111   PROT 4.7 (L) 02/01/2022 0915   PROT 6.3 (L) 12/24/2016 1111   ALBUMIN 2.9 (L) 02/01/2022 0915   ALBUMIN 3.9 12/24/2016 1111   AST 69 (H) 02/01/2022 0915   AST 12 (L) 10/21/2021 1235   AST 17 12/24/2016 1111   ALT 21 02/01/2022 0915   ALT 9 10/21/2021 1235   ALT 14 12/24/2016 1111   ALKPHOS 74 02/01/2022 0915   ALKPHOS 107  12/24/2016 1111   BILITOT 1.9 (H) 02/01/2022 0915   BILITOT 1.4 (H) 10/21/2021 1235   BILITOT 1.64 (H) 12/24/2016 1111   GFRNONAA 56 (L) 02/07/2022 0103   GFRNONAA 58 (L) 10/21/2021 1235   GFRAA 55 (L) 07/27/2019 1054   GFRAA 52 (L) 10/29/2018 1045   No results found for: "CHOL", "HDL", "LDLCALC", "LDLDIRECT", "TRIG", "CHOLHDL" Lab Results  Component Value Date   HGBA1C 4.0 (L) 01/31/2022   Lab Results  Component Value Date   VITAMINB12 493 02/02/2022   Lab Results  Component Value  Date   TSH 3.659 04/25/2021    04/07/21 CT head / orbits / cervical spine 1. Extensive soft tissue contusion and scalp hematoma in the right frontal scalp and periorbital region where there is also small amount of gas in the preseptal soft tissues, presumably related to the reported laceration. Right globe and retro-orbital soft tissues are grossly normal in appearance. 2. Acute fracture of the medial wall of the right orbit. No other acute displaced facial bone fractures are noted. 3. No other acute skull fracture or signs of significant acute traumatic injury to the brain. 4. Mild-to-moderate cerebral and mild cerebellar atrophy with ex vacuo dilatation of the ventricular system and chronic microvascular ischemic changes in the cerebral white matter. 5. No evidence of significant acute traumatic injury to the cervical spine. 6. Multilevel degenerative disc disease and cervical spondylosis, as above.  02/06/22 MRI brain 1. No acute intracranial abnormality. 2. Sequela of moderate to severe chronic microvascular ischemic change.  02/07/22 CT head / cervical spine 1. No acute intracranial pathology. Moderate age-related atrophy and chronic microvascular ischemic changes. 2. No acute/traumatic cervical spine pathology.    ASSESSMENT AND PLAN  89 y.o. year old male here with:   Dx:  1. Moderate dementia without behavioral disturbance, psychotic disturbance, mood disturbance, or anxiety, unspecified dementia type ([place])   2. Gait difficulty      PLAN:  MODERATE DEMENTIA (since ~2022) + gait diff / falls (since 2023) - significant cognitive and motor deficits (may also have superimpose right lumbar radiculopathy vs peripheral neuropathy) - safety / supervision issues reviewed; continue supportive care - NEEDS HELP WITH medications, finances; no driving; cannot live alone  Return for return to PCP.     Penni Bombard, MD [id number], [id number] AM Certified in  Neurology, Neurophysiology and Neuroimaging  [facility] [address] [place], [place] [address] [phone]

## 2022-04-02 DIAGNOSIS — E785 Hyperlipidemia, unspecified: Secondary | ICD-10-CM | POA: Diagnosis not present

## 2022-04-02 DIAGNOSIS — N400 Enlarged prostate without lower urinary tract symptoms: Secondary | ICD-10-CM | POA: Diagnosis not present

## 2022-04-02 DIAGNOSIS — F0390 Unspecified dementia without behavioral disturbance: Secondary | ICD-10-CM | POA: Diagnosis not present

## 2022-04-02 DIAGNOSIS — I442 Atrioventricular block, complete: Secondary | ICD-10-CM | POA: Diagnosis not present

## 2022-04-02 DIAGNOSIS — K219 Gastro-esophageal reflux disease without esophagitis: Secondary | ICD-10-CM | POA: Diagnosis not present

## 2022-04-02 DIAGNOSIS — I480 Paroxysmal atrial fibrillation: Secondary | ICD-10-CM | POA: Diagnosis not present

## 2022-04-02 DIAGNOSIS — I10 Essential (primary) hypertension: Secondary | ICD-10-CM | POA: Diagnosis not present

## 2022-04-02 DIAGNOSIS — R5381 Other malaise: Secondary | ICD-10-CM | POA: Diagnosis not present

## 2022-04-02 DIAGNOSIS — D62 Acute posthemorrhagic anemia: Secondary | ICD-10-CM | POA: Diagnosis not present

## 2022-04-02 DIAGNOSIS — N189 Chronic kidney disease, unspecified: Secondary | ICD-10-CM | POA: Diagnosis not present

## 2022-04-02 DIAGNOSIS — R600 Localized edema: Secondary | ICD-10-CM | POA: Diagnosis not present

## 2022-04-02 DIAGNOSIS — I2510 Atherosclerotic heart disease of native coronary artery without angina pectoris: Secondary | ICD-10-CM | POA: Diagnosis not present

## 2022-04-07 DIAGNOSIS — R600 Localized edema: Secondary | ICD-10-CM | POA: Diagnosis not present

## 2022-04-07 DIAGNOSIS — N400 Enlarged prostate without lower urinary tract symptoms: Secondary | ICD-10-CM | POA: Diagnosis not present

## 2022-04-07 DIAGNOSIS — R4701 Aphasia: Secondary | ICD-10-CM | POA: Diagnosis not present

## 2022-04-07 DIAGNOSIS — R5381 Other malaise: Secondary | ICD-10-CM | POA: Diagnosis not present

## 2022-04-07 DIAGNOSIS — N189 Chronic kidney disease, unspecified: Secondary | ICD-10-CM | POA: Diagnosis not present

## 2022-04-07 DIAGNOSIS — K219 Gastro-esophageal reflux disease without esophagitis: Secondary | ICD-10-CM | POA: Diagnosis not present

## 2022-04-07 DIAGNOSIS — E785 Hyperlipidemia, unspecified: Secondary | ICD-10-CM | POA: Diagnosis not present

## 2022-04-07 DIAGNOSIS — I480 Paroxysmal atrial fibrillation: Secondary | ICD-10-CM | POA: Diagnosis not present

## 2022-04-07 DIAGNOSIS — I10 Essential (primary) hypertension: Secondary | ICD-10-CM | POA: Diagnosis not present

## 2022-04-07 DIAGNOSIS — I2510 Atherosclerotic heart disease of native coronary artery without angina pectoris: Secondary | ICD-10-CM | POA: Diagnosis not present

## 2022-04-07 DIAGNOSIS — I442 Atrioventricular block, complete: Secondary | ICD-10-CM | POA: Diagnosis not present

## 2022-04-07 DIAGNOSIS — H04123 Dry eye syndrome of bilateral lacrimal glands: Secondary | ICD-10-CM | POA: Diagnosis not present

## 2022-04-21 ENCOUNTER — Telehealth: Payer: Self-pay | Admitting: Hematology

## 2022-04-21 DIAGNOSIS — E559 Vitamin D deficiency, unspecified: Secondary | ICD-10-CM | POA: Diagnosis not present

## 2022-04-21 DIAGNOSIS — I480 Paroxysmal atrial fibrillation: Secondary | ICD-10-CM | POA: Diagnosis not present

## 2022-04-21 DIAGNOSIS — I2510 Atherosclerotic heart disease of native coronary artery without angina pectoris: Secondary | ICD-10-CM | POA: Diagnosis not present

## 2022-04-21 DIAGNOSIS — I10 Essential (primary) hypertension: Secondary | ICD-10-CM | POA: Diagnosis not present

## 2022-04-21 DIAGNOSIS — R600 Localized edema: Secondary | ICD-10-CM | POA: Diagnosis not present

## 2022-04-21 DIAGNOSIS — H04123 Dry eye syndrome of bilateral lacrimal glands: Secondary | ICD-10-CM | POA: Diagnosis not present

## 2022-04-21 DIAGNOSIS — R059 Cough, unspecified: Secondary | ICD-10-CM | POA: Diagnosis not present

## 2022-04-21 DIAGNOSIS — R5381 Other malaise: Secondary | ICD-10-CM | POA: Diagnosis not present

## 2022-04-21 DIAGNOSIS — D649 Anemia, unspecified: Secondary | ICD-10-CM | POA: Diagnosis not present

## 2022-04-21 NOTE — Telephone Encounter (Signed)
[name] called to reschedule appointment due to patient catching the flu from rehab facility. Patient rescheduled and will be notified.

## 2022-04-22 ENCOUNTER — Inpatient Hospital Stay: Payer: Medicare Other

## 2022-04-22 ENCOUNTER — Inpatient Hospital Stay: Payer: Medicare Other | Admitting: Hematology

## 2022-04-22 DIAGNOSIS — U071 COVID-19: Secondary | ICD-10-CM | POA: Diagnosis not present

## 2022-04-23 ENCOUNTER — Emergency Department (HOSPITAL_COMMUNITY)
Admission: EM | Admit: 2022-04-23 | Discharge: 2022-04-24 | Disposition: A | Discharge status: Skilled Nursing Facility | Payer: Medicare Other | Attending: Emergency Medicine | Admitting: Emergency Medicine

## 2022-04-23 ENCOUNTER — Emergency Department (HOSPITAL_COMMUNITY): Payer: Medicare Other

## 2022-04-23 ENCOUNTER — Encounter (HOSPITAL_COMMUNITY): Payer: Self-pay

## 2022-04-23 DIAGNOSIS — R7989 Other specified abnormal findings of blood chemistry: Secondary | ICD-10-CM | POA: Diagnosis not present

## 2022-04-23 DIAGNOSIS — J181 Lobar pneumonia, unspecified organism: Secondary | ICD-10-CM | POA: Insufficient documentation

## 2022-04-23 DIAGNOSIS — R4182 Altered mental status, unspecified: Secondary | ICD-10-CM | POA: Diagnosis not present

## 2022-04-23 DIAGNOSIS — U071 COVID-19: Secondary | ICD-10-CM | POA: Diagnosis not present

## 2022-04-23 DIAGNOSIS — N189 Chronic kidney disease, unspecified: Secondary | ICD-10-CM | POA: Insufficient documentation

## 2022-04-23 DIAGNOSIS — R0602 Shortness of breath: Secondary | ICD-10-CM | POA: Diagnosis not present

## 2022-04-23 DIAGNOSIS — R059 Cough, unspecified: Secondary | ICD-10-CM | POA: Diagnosis not present

## 2022-04-23 DIAGNOSIS — I2510 Atherosclerotic heart disease of native coronary artery without angina pectoris: Secondary | ICD-10-CM | POA: Insufficient documentation

## 2022-04-23 DIAGNOSIS — I129 Hypertensive chronic kidney disease with stage 1 through stage 4 chronic kidney disease, or unspecified chronic kidney disease: Secondary | ICD-10-CM | POA: Insufficient documentation

## 2022-04-23 DIAGNOSIS — J1282 Pneumonia due to coronavirus disease 2019: Secondary | ICD-10-CM | POA: Diagnosis not present

## 2022-04-23 DIAGNOSIS — J189 Pneumonia, unspecified organism: Secondary | ICD-10-CM

## 2022-04-23 LAB — COMPREHENSIVE METABOLIC PANEL
ALT: 21 U/L (ref 0–44)
AST: 27 U/L (ref 15–41)
Albumin: 3.5 g/dL (ref 3.5–5.0)
Alkaline Phosphatase: 74 U/L (ref 38–126)
Anion gap: 9 (ref 5–15)
BUN: 44 mg/dL — ABNORMAL HIGH (ref 8–23)
CO2: 24 mmol/L (ref 22–32)
Calcium: 8.5 mg/dL — ABNORMAL LOW (ref 8.9–10.3)
Chloride: 108 mmol/L (ref 98–111)
Creatinine, Ser: 1.49 mg/dL — ABNORMAL HIGH (ref 0.61–1.24)
GFR, Estimated: 45 mL/min — ABNORMAL LOW (ref >60)
Glucose, Bld: 120 mg/dL — ABNORMAL HIGH (ref 70–99)
Potassium: 3.7 mmol/L (ref 3.5–5.1)
Sodium: 141 mmol/L (ref 135–145)
Total Bilirubin: 1 mg/dL (ref 0.3–1.2)
Total Protein: 6.6 g/dL (ref 6.5–8.1)

## 2022-04-23 LAB — PROTIME-INR
INR: 1.1 (ref 0.8–1.2)
Prothrombin Time: 14 seconds (ref 11.4–15.2)

## 2022-04-23 LAB — CBC WITH DIFFERENTIAL/PLATELET
Abs Immature Granulocytes: 0.05 10*3/uL (ref 0.00–0.07)
Basophils Absolute: 0 10*3/uL (ref 0.0–0.1)
Basophils Relative: 0 %
Eosinophils Absolute: 0 10*3/uL (ref 0.0–0.5)
Eosinophils Relative: 0 %
HCT: 26.9 % — ABNORMAL LOW (ref 39.0–52.0)
Hemoglobin: 8.2 g/dL — ABNORMAL LOW (ref 13.0–17.0)
Immature Granulocytes: 1 %
Lymphocytes Relative: 10 %
Lymphs Abs: 0.4 10*3/uL — ABNORMAL LOW (ref 0.7–4.0)
MCH: 24.8 pg — ABNORMAL LOW (ref 26.0–34.0)
MCHC: 30.5 g/dL (ref 30.0–36.0)
MCV: 81.3 fL (ref 80.0–100.0)
Monocytes Absolute: 0.4 10*3/uL (ref 0.1–1.0)
Monocytes Relative: 10 %
Neutro Abs: 3.1 10*3/uL (ref 1.7–7.7)
Neutrophils Relative %: 79 %
Platelets: 143 10*3/uL — ABNORMAL LOW (ref 150–400)
RBC: 3.31 MIL/uL — ABNORMAL LOW (ref 4.22–5.81)
RDW: 16.3 % — ABNORMAL HIGH (ref 11.5–15.5)
WBC: 4 10*3/uL (ref 4.0–10.5)
nRBC: 0 % (ref 0.0–0.2)

## 2022-04-23 MED ORDER — SODIUM CHLORIDE 0.9 % IV BOLUS
500.0000 mL | Freq: Once | INTRAVENOUS | Status: AC
  Administered 2022-04-23: 500 mL via INTRAVENOUS

## 2022-04-23 MED ORDER — IOHEXOL 350 MG/ML SOLN
80.0000 mL | Freq: Once | INTRAVENOUS | Status: AC | PRN
  Administered 2022-04-23: 80 mL via INTRAVENOUS

## 2022-04-23 NOTE — ED Provider Notes (Signed)
Received at shift change from Valetta Mole PA-C please see note for full detail  In short patient with medical history including hypertension, hyperlipidemia, CAD, heart block status post PPM, SVT status post ablations, proximal A-fib, CKD, memory loss level 5 caveat due to altered mental status but per previous provider at his baseline, ambulates with a walker, presenting from nursing facility, was diagnosed with COVID as facility, apparently showing no symptoms, evidently a D-dimer was obtained which was elevated and sent here for further evaluation.  No apparent falls, no associated cough congestion fevers chills, no stomach pain nausea vomiting, no other complaints.  Per previous provider follow-up on CTA of chest if unremarkable can be discharged home back to nursing facility. Physical Exam  BP (!) 179/71 (BP Location: Left Arm)   Pulse 86   Temp 98.2 F (36.8 C) (Oral)   Resp 16   SpO2 100%   Physical Exam Vitals and nursing note reviewed.  Constitutional:      General: He is not in acute distress.    Appearance: He is not ill-appearing.  HENT:     Head: Normocephalic and atraumatic.     Nose: No congestion.  Eyes:     Conjunctiva/sclera: Conjunctivae normal.  Cardiovascular:     Rate and Rhythm: Normal rate and regular rhythm.     Pulses: Normal pulses.     Heart sounds: No murmur heard.    No friction rub. No gallop.  Pulmonary:     Effort: No respiratory distress.     Breath sounds: No wheezing, rhonchi or rales.  Abdominal:     Palpations: Abdomen is soft.     Tenderness: There is no abdominal tenderness. There is no right CVA tenderness or left CVA tenderness.  Skin:    General: Skin is warm and dry.  Neurological:     Mental Status: He is alert.     Comments: No facial asymmetry, able to follow two-step commands, no unilateral weakness present.  Psychiatric:        Mood and Affect: Mood normal.     Procedures  Procedures  ED Course / MDM    Medical Decision  Making Amount and/or Complexity of Data Reviewed Labs: ordered. Radiology: ordered.  Risk Prescription drug management.      Lab Tests:  I Ordered, and personally interpreted labs.  The pertinent results include: CBC shows normocytic anemia hemoglobin 8.2 peers to be at baseline, CMP reveals glucose of 120, BUN 44, creatinine slightly above his baseline at 149, GFR 45, prothrombin time INR unremarkable   Imaging Studies ordered:  I ordered imaging studies including chest x-ray, CTA of chest I independently visualized and interpreted imaging which showed chest x-ray unremarkable, CTA negative for PE possible infiltrate left lower lobe I agree with the radiologist interpretation   Cardiac Monitoring:  The patient was maintained on a cardiac monitor.  I personally viewed and interpreted the cardiac monitored which showed an underlying rhythm of: n/a   Medicines ordered and prescription drug management:  I ordered medication including N/A I have reviewed the patients home medicines and have made adjustments as needed  Critical Interventions:  N/A   Reevaluation:  Reassessed the patient resting comfortably, lung sounds are clear bilaterally, on room air satting at 98%, spoke with nursing staff at facility where patient staying patient having cough for a week's time and has not been started on Paxlovid.  Will discharge home on antibiotics.  Consultations Obtained:  N/A    Test Considered:  N/A  Rule out I have low suspicion for ACS as history is atypical EKG was sinus rhythm without signs of ischemia.  Doubt PE or dissection as CTA is negative these findings.  Low suspicion for systemic infection as patient is nontoxic-appearing, vital signs reassuring, no obvious source infection noted on exam.    Dispostion and problem list  After consideration of the diagnostic results and the patients response to treatment, I feel that the patent would benefit from  discharge.  Left lower lobe pneumonia-seen on CTA of chest, has a slight cough during my examination, patient has been coughing for a week's time, will defer on treatment with antivirals, as he outside the treatment window, will start antibiotics follow-up with his PCP for further evaluation and strict return precautions.

## 2022-04-23 NOTE — ED Provider Notes (Signed)
[facility] Provider Note   CSN: [id number] Arrival date & time: 04/23/22  1731     History Dementia, CAD, SVT s/p ablation/ PAF, CKD3 Chief Complaint  Patient presents with   Cough   abnormal labs    Brandon ClinesRobert R Fischer is a 89 y.o. male.  89 y.o male with a PMH of dementia [facility] due to positive Covid 19 test and low hemoglobin. Level 5 caveat due to Dementia.   The history is provided by medical records and the EMS personnel.  Cough      Home Medications Prior to Admission medications   Medication Sig Start Date End Date Taking? Authorizing Provider  albuterol (VENTOLIN HFA) 108 (90 Base) MCG/ACT inhaler Inhale 2 puffs into the lungs every 4 (four) hours as needed for wheezing or shortness of breath.   Yes [provider]  ascorbic acid (VITAMIN C) 500 MG tablet Take 500 mg by mouth in the morning.   Yes [provider]  aspirin (ASPIRIN CHILDRENS) 81 MG chewable tablet Chew 1 tablet (81 mg total) by mouth daily. 07/20/20  Yes Yates DecampGanji, Jay, MD  atorvastatin (LIPITOR) 20 MG tablet Take 20 mg by mouth every evening.   Yes [provider]  Cholecalciferol (VITAMIN D-3) 25 MCG (1000 UT) CAPS Take 1,000 Units by mouth daily with breakfast.   Yes [provider]  doxycycline (VIBRAMYCIN) 100 MG capsule Take 1 capsule (100 mg total) by mouth 2 (two) times daily for 7 days. 04/24/22 05/01/22 Yes Carroll SageFaulkner, William J, PA-C  hydrALAZINE (APRESOLINE) 25 MG tablet Take 25 mg by mouth 3 (three) times daily.   Yes [provider]  metoprolol succinate (TOPROL-XL) 25 MG 24 hr tablet TAKE 1 TABLET (25 MG TOTAL) BY MOUTH DAILY. Patient taking differently: Take 25 mg by mouth in the morning. 07/15/21 02/01/23 Yes Yates DecampGanji, Jay, MD  Multiple Vitamins-Iron (MULTIVITAMIN/IRON PO) Take 1 tablet by mouth daily with breakfast.   Yes [provider]  pantoprazole (PROTONIX) 40 MG  tablet Take 40 mg by mouth in the morning and at bedtime.    Yes [provider]  REFRESH TEARS 0.5 % SOLN Place 1 drop into both eyes in the morning and at bedtime.   Yes [provider]  tamsulosin (FLOMAX) 0.4 MG CAPS capsule Take 0.4 mg by mouth every evening.   Yes [provider]  traMADol (ULTRAM) 50 MG tablet Take 1 tablet (50 mg total) by mouth every 6 (six) hours as needed for moderate pain or severe pain. Post-operatively Patient taking differently: Take 50 mg by mouth every 6 (six) hours as needed (for pain). 02/06/22  Yes Regalado, Belkys A, MD  zinc gluconate 50 MG tablet Take 50 mg by mouth daily.   Yes [provider]  hydrALAZINE (APRESOLINE) 10 MG tablet Take 1 tablet (10 mg total) by mouth 2 (two) times daily. Patient not taking: Reported on 04/23/2022 02/06/22 04/23/22  Hartley Barefootegalado, Belkys A, MD  vitamin B-12 100 MCG tablet Take 1 tablet (100 mcg total) by mouth daily. Patient not taking: Reported on 04/23/2022 02/07/22   Hartley Barefootegalado, Belkys A, MD      Allergies    Bupropion, Trazodone, and Olmesartan    Review of Systems   Review of Systems  Unable to perform ROS: Dementia  Respiratory:  Positive for cough.     Physical Exam Updated Vital Signs BP (!) 168/57   Pulse 90   Temp 98.2  F (36.8 C) (Oral)   Resp (!) 23   SpO2 100%  Physical Exam Vitals and nursing note reviewed.  Constitutional:      Appearance: He is not ill-appearing.  HENT:     Head: Normocephalic and atraumatic.     Mouth/Throat:     Mouth: Mucous membranes are dry.  Eyes:     Pupils: Pupils are equal, round, and reactive to light.  Cardiovascular:     Rate and Rhythm: Normal rate.  Pulmonary:     Effort: Pulmonary effort is normal.     Breath sounds: No rales.     Comments: Diffuse crackles. Abdominal:     General: Abdomen is flat.     Palpations: Abdomen is soft.     Tenderness: There is no abdominal tenderness.  Musculoskeletal:     Cervical back: Normal  range of motion and neck supple.  Skin:    General: Skin is warm and dry.  Neurological:     Mental Status: He is alert. Mental status is at baseline.     ED Results / Procedures / Treatments   Labs (all labs ordered are listed, but only abnormal results are displayed) Labs Reviewed  CBC WITH DIFFERENTIAL/PLATELET - Abnormal; Notable for the following components:      Result Value   RBC 3.31 (*)    Hemoglobin 8.2 (*)    HCT 26.9 (*)    MCH 24.8 (*)    RDW 16.3 (*)    Platelets 143 (*)    Lymphs Abs 0.4 (*)    All other components within normal limits  COMPREHENSIVE METABOLIC PANEL - Abnormal; Notable for the following components:   Glucose, Bld 120 (*)    BUN 44 (*)    Creatinine, Ser 1.49 (*)    Calcium 8.5 (*)    GFR, Estimated 45 (*)    All other components within normal limits  PROTIME-INR    EKG EKG Interpretation  Date/Time:  Thursday April 24 2022 00:17:14 EDT Ventricular Rate:  84 PR Interval:  206 QRS Duration: 112 QT Interval:  416 QTC Calculation: 492 R Axis:   -61 Text Interpretation: AV dual-paced complexes Confirmed by Gilda Crease [phone]) on 04/24/2022 12:25:05 AM  Radiology No results found.  Procedures Procedures    Medications Ordered in ED Medications  sodium chloride 0.9 % bolus 500 mL (0 mLs Intravenous Stopped 04/23/22 2315)  iohexol (OMNIPAQUE) 350 MG/ML injection 80 mL (80 mLs Intravenous Contrast Given 04/23/22 2156)    ED Course/ Medical Decision Making/ A&P                             Medical Decision Making Amount and/or Complexity of Data Reviewed Labs: ordered. Radiology: ordered.  Risk Prescription drug management.   This patient presents to the ED for concern of cough, this involves a number of treatment options, and is a complaint that carries with it a high risk of complications and morbidity.  The differential diagnosis includes pulmonary embolism, pneumonia versus viral illness.    Co  morbidities: Discussed in HPI   Brief History:  See HPI.   EMR reviewed including pt PMHx, past surgical history and past visits to ER.   See HPI for more details   Lab Tests:  I ordered and independently interpreted labs.  The pertinent results include:    Labs notable for CBC with no leukocytosis, hemoglobin slightly decreased however within his baseline.  CMP with  no electrolyte derangement, BUN slightly elevated, therefore given 500 bolus.  Creatinine level slightly trending up from his baseline.  LFTs are within normal limits.  PT/INR normal.   Imaging Studies:  Chest x-ray is normal. CT Angio to r/o PE is pending.    Medicines ordered:  I ordered medication including bolus  for elevated BUN Reevaluation of the patient after these medicines showed that the patient stayed the same I have reviewed the patients home medicines and have made adjustments as needed  Reevaluation:  After the interventions noted above I re-evaluated patient and found that they have :stayed the same   Social Determinants of Health:  The patient's social determinants of health were a factor in the care of this patient   Problem List / ED Course:  [facility] with a chief complaint of elevated D-dimer.  Unable to obtain history from patient due to level 5 caveat of dementia.  EMS reports patient with an elevated D-dimer, new diagnosis of COVID-19 placed on oxygen by nursing staff.  On exam, patient appears clinically dry, CMP does show a slight elevation in his BUN, creatinine trending up consistent with this.  He does have a recent echo with a good EF, therefore 500 bolus was administered.  LFTs are within normal limits.  CBC without any leukocytosis.  Hemoglobin slightly creased but within his prior baseline.  PT and INR normal.  He does have a history of paroxysmal A-fib, however I am unable to view a prior blood thinner that he has been on. Patient's rectal  temperature was checked by nursing staff, he is afebrile here.  Blood pressure is normal.  He is at 100% on room air after discontinuing oxygen. I was able to speak to facility after multiple attempts and spoke to [facility], who reports patient tested positive for COVID-19 last night, the NP on-call order blood work on them, including an elevated D-dimer.  Standing orders reported that patient needs to be placed on oxygen if he drops below 93%, therefore he was placed on prophylactic oxygen despite no hypoxia.  States that patient is acting himself aside from "acting like he is sick ", not out of baseline.  No falls reported. Patient stating at 100% on RA without any hypoxia. CT Angio is pending, after speaking to facility I do feel patient is appropriate for disposition back to facility if CT Angio is negative.   Dispostion:  Patient care signed out pending CTA.    Portions of this note were generated with Scientist, clinical (histocompatibility and immunogenetics). Dictation errors may occur despite best attempts at proofreading.   Final Clinical Impression(s) / ED Diagnoses Final diagnoses:  COVID-19  Pneumonia of left lower lobe due to infectious organism    Rx / DC Orders ED Discharge Orders          Ordered    doxycycline (VIBRAMYCIN) 100 MG capsule  2 times daily        04/24/22 0025              Claude Manges, PA-C 05/01/22 2332    Loetta Rough, MD 05/02/22 1103

## 2022-04-23 NOTE — ED Triage Notes (Addendum)
BIBA from [facility] for cough (tested Covid positive today), elevated D-Dimer of 930, hgb 8.6. Hx of dementia 160/68 BP 76 HR paced 100% 2lpm

## 2022-04-24 MED ORDER — DOXYCYCLINE HYCLATE 100 MG PO CAPS: 100.0000 mg | ORAL_CAPSULE | Freq: Two times a day (BID) | ORAL | 0 refills | Status: AC

## 2022-04-24 NOTE — Discharge Instructions (Signed)
Your lab work and imaging reveals that you have a left lower lobe pneumonia, started you on antibiotics please take as prescribed.  Like you to follow-up with your primary doctor in a week's time for repeat chest x-ray.  Come back to the emergency department if you develop chest pain, shortness of breath, severe abdominal pain, uncontrolled nausea, vomiting, diarrhea.

## 2022-04-28 ENCOUNTER — Other Ambulatory Visit: Payer: Self-pay

## 2022-04-28 ENCOUNTER — Emergency Department (HOSPITAL_COMMUNITY)
Admission: EM | Admit: 2022-04-28 | Discharge: 2022-04-29 | Disposition: A | Discharge status: Skilled Nursing Facility | Payer: Medicare Other | Attending: Emergency Medicine | Admitting: Emergency Medicine

## 2022-04-28 ENCOUNTER — Emergency Department (HOSPITAL_COMMUNITY): Payer: Medicare Other

## 2022-04-28 ENCOUNTER — Telehealth: Payer: Self-pay

## 2022-04-28 ENCOUNTER — Encounter (HOSPITAL_COMMUNITY): Payer: Self-pay

## 2022-04-28 DIAGNOSIS — K219 Gastro-esophageal reflux disease without esophagitis: Secondary | ICD-10-CM | POA: Diagnosis not present

## 2022-04-28 DIAGNOSIS — W19XXXA Unspecified fall, initial encounter: Secondary | ICD-10-CM | POA: Diagnosis not present

## 2022-04-28 DIAGNOSIS — I2510 Atherosclerotic heart disease of native coronary artery without angina pectoris: Secondary | ICD-10-CM | POA: Diagnosis not present

## 2022-04-28 DIAGNOSIS — Y92129 Unspecified place in nursing home as the place of occurrence of the external cause: Secondary | ICD-10-CM | POA: Diagnosis not present

## 2022-04-28 DIAGNOSIS — I10 Essential (primary) hypertension: Secondary | ICD-10-CM | POA: Diagnosis not present

## 2022-04-28 DIAGNOSIS — E785 Hyperlipidemia, unspecified: Secondary | ICD-10-CM | POA: Diagnosis not present

## 2022-04-28 DIAGNOSIS — U071 COVID-19: Secondary | ICD-10-CM | POA: Insufficient documentation

## 2022-04-28 DIAGNOSIS — I480 Paroxysmal atrial fibrillation: Secondary | ICD-10-CM | POA: Diagnosis not present

## 2022-04-28 DIAGNOSIS — F0390 Unspecified dementia without behavioral disturbance: Secondary | ICD-10-CM | POA: Diagnosis not present

## 2022-04-28 DIAGNOSIS — Z7982 Long term (current) use of aspirin: Secondary | ICD-10-CM | POA: Insufficient documentation

## 2022-04-28 DIAGNOSIS — Z79899 Other long term (current) drug therapy: Secondary | ICD-10-CM | POA: Insufficient documentation

## 2022-04-28 DIAGNOSIS — N400 Enlarged prostate without lower urinary tract symptoms: Secondary | ICD-10-CM | POA: Diagnosis not present

## 2022-04-28 DIAGNOSIS — R600 Localized edema: Secondary | ICD-10-CM | POA: Diagnosis not present

## 2022-04-28 DIAGNOSIS — R059 Cough, unspecified: Secondary | ICD-10-CM | POA: Diagnosis present

## 2022-04-28 DIAGNOSIS — J189 Pneumonia, unspecified organism: Secondary | ICD-10-CM | POA: Diagnosis not present

## 2022-04-28 DIAGNOSIS — T1490XA Injury, unspecified, initial encounter: Secondary | ICD-10-CM | POA: Diagnosis not present

## 2022-04-28 DIAGNOSIS — R4182 Altered mental status, unspecified: Secondary | ICD-10-CM | POA: Diagnosis not present

## 2022-04-28 DIAGNOSIS — R410 Disorientation, unspecified: Secondary | ICD-10-CM | POA: Diagnosis not present

## 2022-04-28 DIAGNOSIS — R220 Localized swelling, mass and lump, head: Secondary | ICD-10-CM | POA: Diagnosis not present

## 2022-04-28 DIAGNOSIS — R5381 Other malaise: Secondary | ICD-10-CM | POA: Diagnosis not present

## 2022-04-28 DIAGNOSIS — H04123 Dry eye syndrome of bilateral lacrimal glands: Secondary | ICD-10-CM | POA: Diagnosis not present

## 2022-04-28 DIAGNOSIS — R4701 Aphasia: Secondary | ICD-10-CM | POA: Diagnosis not present

## 2022-04-28 LAB — CBC WITH DIFFERENTIAL/PLATELET
Abs Immature Granulocytes: 0.04 10*3/uL (ref 0.00–0.07)
Basophils Absolute: 0 10*3/uL (ref 0.0–0.1)
Basophils Relative: 0 %
Eosinophils Absolute: 0.1 10*3/uL (ref 0.0–0.5)
Eosinophils Relative: 1 %
HCT: 34.9 % — ABNORMAL LOW (ref 39.0–52.0)
Hemoglobin: 11.1 g/dL — ABNORMAL LOW (ref 13.0–17.0)
Immature Granulocytes: 1 %
Lymphocytes Relative: 28 %
Lymphs Abs: 1.2 10*3/uL (ref 0.7–4.0)
MCH: 24.7 pg — ABNORMAL LOW (ref 26.0–34.0)
MCHC: 31.8 g/dL (ref 30.0–36.0)
MCV: 77.7 fL — ABNORMAL LOW (ref 80.0–100.0)
Monocytes Absolute: 0.3 10*3/uL (ref 0.1–1.0)
Monocytes Relative: 7 %
Neutro Abs: 2.6 10*3/uL (ref 1.7–7.7)
Neutrophils Relative %: 63 %
Platelets: 229 10*3/uL (ref 150–400)
RBC: 4.49 MIL/uL (ref 4.22–5.81)
RDW: 16.1 % — ABNORMAL HIGH (ref 11.5–15.5)
WBC: 4.1 10*3/uL (ref 4.0–10.5)
nRBC: 0 % (ref 0.0–0.2)

## 2022-04-28 LAB — COMPREHENSIVE METABOLIC PANEL
ALT: 30 U/L (ref 0–44)
AST: 33 U/L (ref 15–41)
Albumin: 3.9 g/dL (ref 3.5–5.0)
Alkaline Phosphatase: 107 U/L (ref 38–126)
Anion gap: 11 (ref 5–15)
BUN: 31 mg/dL — ABNORMAL HIGH (ref 8–23)
CO2: 24 mmol/L (ref 22–32)
Calcium: 9.3 mg/dL (ref 8.9–10.3)
Chloride: 110 mmol/L (ref 98–111)
Creatinine, Ser: 1.37 mg/dL — ABNORMAL HIGH (ref 0.61–1.24)
GFR, Estimated: 50 mL/min — ABNORMAL LOW (ref >60)
Glucose, Bld: 108 mg/dL — ABNORMAL HIGH (ref 70–99)
Potassium: 3.9 mmol/L (ref 3.5–5.1)
Sodium: 145 mmol/L (ref 135–145)
Total Bilirubin: 0.9 mg/dL (ref 0.3–1.2)
Total Protein: 7.4 g/dL (ref 6.5–8.1)

## 2022-04-28 LAB — RESP PANEL BY RT-PCR (RSV, FLU A&B, COVID)  RVPGX2
Influenza A by PCR: NEGATIVE
Influenza B by PCR: NEGATIVE
Resp Syncytial Virus by PCR: NEGATIVE
SARS Coronavirus 2 by RT PCR: POSITIVE — AB

## 2022-04-28 LAB — CK: Total CK: 212 U/L (ref 49–397)

## 2022-04-28 MED ORDER — LACTATED RINGERS IV BOLUS
500.0000 mL | Freq: Once | INTRAVENOUS | Status: AC
  Administered 2022-04-28: 500 mL via INTRAVENOUS

## 2022-04-28 NOTE — ED Triage Notes (Signed)
Comes from [facility] after a fall and lying in the floor for two hours.  Patient also had a congested cough and covid is going around the facility.  Patient is oriented to baseline and only answer yes and no questions.

## 2022-04-28 NOTE — Telephone Encounter (Signed)
     Patient  visit on 4/11  at [facility]   Have you been able to follow up with your primary care physician? Yes  SNF  The patient was or was not able to obtain any needed medicine or equipment. Yes   Are there diet recommendations that you are having difficulty following?  NA   Patient expresses understanding of discharge instructions and education provided has no other needs at this time.  YES      Lenard Forth [facility] Guide, [facility] [phone] [address] [place], [place], [place] [address] Phone: [phone] Email: [email or web address]

## 2022-04-28 NOTE — ED Provider Notes (Signed)
EMERGENCY DEPARTMENT AT [facility] Provider Note   CSN: [id number] Arrival date & time: 04/28/22  1736     History  Chief Complaint  Patient presents with   Fall   Cough    Brandon Fischer is a 89 y.o. male.  HPI 89 year old male presents after a fall.  Triage note is the only significant history able to be obtained as the patient has dementia and the nursing facility did not answer when I called.  Patient comes from [facility] and he was apparently found on the floor.  Patient has no acute complaints.  He knows he is in the hospital but denies falling.  The report was also the patient has been coughing and COVID has been going around the facility though chart review shows that he was in a different ER 5 days ago for COVID symptoms.  Home Medications Prior to Admission medications   Medication Sig Start Date End Date Taking? Authorizing Provider  albuterol (VENTOLIN HFA) 108 (90 Base) MCG/ACT inhaler Inhale 2 puffs into the lungs every 4 (four) hours as needed for wheezing or shortness of breath.    [provider]  ascorbic acid (VITAMIN C) 500 MG tablet Take 500 mg by mouth in the morning.    [provider]  aspirin (ASPIRIN CHILDRENS) 81 MG chewable tablet Chew 1 tablet (81 mg total) by mouth daily. 07/20/20   Yates Decamp, MD  atorvastatin (LIPITOR) 20 MG tablet Take 20 mg by mouth every evening.    [provider]  Cholecalciferol (VITAMIN D-3) 25 MCG (1000 UT) CAPS Take 1,000 Units by mouth daily with breakfast.    [provider]  doxycycline (VIBRAMYCIN) 100 MG capsule Take 1 capsule (100 mg total) by mouth 2 (two) times daily for 7 days. 04/24/22 05/01/22  Carroll Sage, PA-C  hydrALAZINE (APRESOLINE) 10 MG tablet Take 1 tablet (10 mg total) by mouth 2 (two) times daily. Patient not taking: Reported on 04/23/2022 02/06/22 04/23/22  Hartley Barefoot A, MD  hydrALAZINE (APRESOLINE) 25 MG tablet Take 25 mg by mouth  3 (three) times daily.    [provider]  metoprolol succinate (TOPROL-XL) 25 MG 24 hr tablet TAKE 1 TABLET (25 MG TOTAL) BY MOUTH DAILY. Patient taking differently: Take 25 mg by mouth in the morning. 07/15/21 02/01/23  Yates Decamp, MD  Multiple Vitamins-Iron (MULTIVITAMIN/IRON PO) Take 1 tablet by mouth daily with breakfast.    [provider]  pantoprazole (PROTONIX) 40 MG tablet Take 40 mg by mouth in the morning and at bedtime.     [provider]  REFRESH TEARS 0.5 % SOLN Place 1 drop into both eyes in the morning and at bedtime.    [provider]  tamsulosin (FLOMAX) 0.4 MG CAPS capsule Take 0.4 mg by mouth every evening.    [provider]  traMADol (ULTRAM) 50 MG tablet Take 1 tablet (50 mg total) by mouth every 6 (six) hours as needed for moderate pain or severe pain. Post-operatively Patient taking differently: Take 50 mg by mouth every 6 (six) hours as needed (for pain). 02/06/22   Regalado, Belkys A, MD  vitamin B-12 100 MCG tablet Take 1 tablet (100 mcg total) by mouth daily. Patient not taking: Reported on 04/23/2022 02/07/22   Hartley Barefoot A, MD  zinc gluconate 50 MG tablet Take 50 mg by mouth daily.    [provider]      Allergies    Bupropion, Trazodone, and  Olmesartan    Review of Systems   Review of Systems  Unable to perform ROS: Dementia    Physical Exam Updated Vital Signs BP (!) 166/77   Pulse 88   Temp 98.4 F (36.9 C) (Oral)   Resp 18   Ht 5\' 10"  (1.778 m)   Wt 79.4 kg   SpO2 100%   BMI 25.11 kg/m  Physical Exam Vitals and nursing note reviewed.  Constitutional:      General: He is not in acute distress.    Appearance: He is well-developed. He is not ill-appearing or diaphoretic.  HENT:     Head: Normocephalic and atraumatic.     Comments: No obvious scalp/head trauma Neck:     Comments: No neck tenderness Cardiovascular:     Rate and Rhythm: Normal rate and regular rhythm.     Heart sounds:  Normal heart sounds.  Pulmonary:     Effort: Pulmonary effort is normal.     Breath sounds: Normal breath sounds. No wheezing.  Abdominal:     General: There is no distension.     Palpations: Abdomen is soft.     Tenderness: There is no abdominal tenderness.  Skin:    General: Skin is warm and dry.  Neurological:     Mental Status: He is alert.     Comments: Awake, alert, oriented to self and place. Disoriented to time/situation. 5/5 strength in all 4 extremities. No slurred speech     ED Results / Procedures / Treatments   Labs (all labs ordered are listed, but only abnormal results are displayed) Labs Reviewed  RESP PANEL BY RT-PCR (RSV, FLU A&B, COVID)  RVPGX2 - Abnormal; Notable for the following components:      Result Value   SARS Coronavirus 2 by RT PCR POSITIVE (*)    All other components within normal limits  COMPREHENSIVE METABOLIC PANEL - Abnormal; Notable for the following components:   Glucose, Bld 108 (*)    BUN 31 (*)    Creatinine, Ser 1.37 (*)    GFR, Estimated 50 (*)    All other components within normal limits  CBC WITH DIFFERENTIAL/PLATELET - Abnormal; Notable for the following components:   Hemoglobin 11.1 (*)    HCT 34.9 (*)    MCV 77.7 (*)    MCH 24.7 (*)    RDW 16.1 (*)    All other components within normal limits  CK    EKG None  Radiology DG Chest 2 View  Result Date: 04/28/2022 CLINICAL DATA:  Coughing and altered mental status. EXAM: CHEST - 2 VIEW COMPARISON:  CTA chest 04/23/2022, portable chest 04/23/2022 FINDINGS: There is mild cardiomegaly without overt CHF with stable appearance of a left chest dual lead pacing system and wires. There are multiple overlying monitor wires. There is calcification in the transverse aorta. The mediastinum is normally outlined. The sulci are sharp. The lungs are radiographically clear. There was a patchy hazy left lower lobe infiltrate on the CT which either has resolved or is resolving and below the limits  of radiographic resolution. There is bridging enthesopathy and marginal osteophytes throughout the thoracic spine. Mild osteopenia. IMPRESSION: 1. No acute radiographic chest findings. 2. Mild cardiomegaly. 3. Aortic atherosclerosis. 4. Left lower lobe patchy infiltrate seen on CT 5 days ago is either resolved or resolving and below the limits of radiographic resolution. Electronically Signed   By: Almira Bar M.D.   On: 04/28/2022 20:17   CT Cervical Spine Wo Contrast  Result  Date: 04/28/2022 CLINICAL DATA:  Status post trauma. EXAM: CT CERVICAL SPINE WITHOUT CONTRAST TECHNIQUE: Multidetector CT imaging of the cervical spine was performed without intravenous contrast. Multiplanar CT image reconstructions were also generated. RADIATION DOSE REDUCTION: This exam was performed according to the departmental dose-optimization program which includes automated exposure control, adjustment of the mA and/or kV according to patient size and/or use of iterative reconstruction technique. COMPARISON:  February 07, 2022 FINDINGS: Alignment: No acute subluxation. Skull base and vertebrae: No acute fracture. No primary bone lesion or focal pathologic process. Soft tissues and spinal canal: No prevertebral fluid or swelling. No visible canal hematoma. Disc levels: Marked severity endplate sclerosis, anterior osteophyte formation and posterior bony spurring are seen at the levels of C4-C5, C5-C6, C6-C7 and C7-T1. Moderate severity anterior osteophyte formation is also seen at C2-C3 and C3-C4. There is marked severity narrowing of the anterior atlantoaxial articulation. Marked severity intervertebral disc space narrowing is seen at C4-C5, C5-C6, C6-C7 and C7-T1. Bilateral marked severity multilevel facet joint hypertrophy is noted. Upper chest: Negative. Other: None. IMPRESSION: Marked severity multilevel degenerative changes without evidence of an acute fracture or subluxation. Electronically Signed   By: Aram Candela  M.D.   On: 04/28/2022 19:18   CT Head Wo Contrast  Result Date: 04/28/2022 CLINICAL DATA:  Status post trauma. EXAM: CT HEAD WITHOUT CONTRAST TECHNIQUE: Contiguous axial images were obtained from the base of the skull through the vertex without intravenous contrast. RADIATION DOSE REDUCTION: This exam was performed according to the departmental dose-optimization program which includes automated exposure control, adjustment of the mA and/or kV according to patient size and/or use of iterative reconstruction technique. COMPARISON:  February 07, 2022 FINDINGS: Brain: There is moderate severity cerebral atrophy with widening of the extra-axial spaces and ventricular dilatation. There are areas of decreased attenuation within the white matter tracts of the supratentorial brain, consistent with microvascular disease changes. Vascular: No hyperdense vessel or unexpected calcification. Skull: Normal. Negative for fracture or focal lesion. Sinuses/Orbits: There is mild left maxillary sinus and mild bilateral ethmoid sinus mucosal thickening. Other: Mild right frontal scalp soft tissue swelling is seen. IMPRESSION: 1. Mild right frontal scalp soft tissue swelling without evidence for an acute fracture or acute intracranial abnormality. 2. Generalized cerebral atrophy with chronic white matter small vessel ischemic changes. 3. Mild left maxillary sinus and bilateral ethmoid sinus disease. Electronically Signed   By: Aram Candela M.D.   On: 04/28/2022 19:15    Procedures Procedures    Medications Ordered in ED Medications  lactated ringers bolus 500 mL (0 mLs Intravenous Stopped 04/28/22 2026)    ED Course/ Medical Decision Making/ A&P                             Medical Decision Making Amount and/or Complexity of Data Reviewed Labs: ordered. Radiology: ordered.   Patient is well-appearing and at least seems near his baseline.  I discussed with his daughter, Brandon Fischer, and it seems like he was  diagnosed with COVID about 6 days ago.  He fell today although chart review shows he falls fairly often.  No significant trauma noted.  Workup is unremarkable.  No decreased hip range of motion or pain.  At this point, labs were obtained and are overall unremarkable.  His COVID test is positive though this was obtained in triage and does not surprising based on this history.  There was a possible pneumonia seen on CT a few  days ago that seems to be resolved.  Overall I do not think he has an emergent medical condition and appears stable for discharge back to his facility.  Discussed all this with the daughter.        Final Clinical Impression(s) / ED Diagnoses Final diagnoses:  Fall, initial encounter    Rx / DC Orders ED Discharge Orders     None         Pricilla Loveless, MD 04/28/22 2328

## 2022-04-28 NOTE — ED Notes (Signed)
Zella Ball pt daughter called for a update [phone]

## 2022-04-29 DIAGNOSIS — I129 Hypertensive chronic kidney disease with stage 1 through stage 4 chronic kidney disease, or unspecified chronic kidney disease: Secondary | ICD-10-CM | POA: Diagnosis not present

## 2022-04-30 DIAGNOSIS — E785 Hyperlipidemia, unspecified: Secondary | ICD-10-CM | POA: Diagnosis not present

## 2022-04-30 DIAGNOSIS — I442 Atrioventricular block, complete: Secondary | ICD-10-CM | POA: Diagnosis not present

## 2022-04-30 DIAGNOSIS — F0390 Unspecified dementia without behavioral disturbance: Secondary | ICD-10-CM | POA: Diagnosis not present

## 2022-04-30 DIAGNOSIS — N189 Chronic kidney disease, unspecified: Secondary | ICD-10-CM | POA: Diagnosis not present

## 2022-04-30 DIAGNOSIS — N400 Enlarged prostate without lower urinary tract symptoms: Secondary | ICD-10-CM | POA: Diagnosis not present

## 2022-04-30 DIAGNOSIS — I10 Essential (primary) hypertension: Secondary | ICD-10-CM | POA: Diagnosis not present

## 2022-04-30 DIAGNOSIS — I2510 Atherosclerotic heart disease of native coronary artery without angina pectoris: Secondary | ICD-10-CM | POA: Diagnosis not present

## 2022-04-30 DIAGNOSIS — J189 Pneumonia, unspecified organism: Secondary | ICD-10-CM | POA: Diagnosis not present

## 2022-04-30 DIAGNOSIS — H04123 Dry eye syndrome of bilateral lacrimal glands: Secondary | ICD-10-CM | POA: Diagnosis not present

## 2022-04-30 DIAGNOSIS — U071 COVID-19: Secondary | ICD-10-CM | POA: Diagnosis not present

## 2022-04-30 DIAGNOSIS — R600 Localized edema: Secondary | ICD-10-CM | POA: Diagnosis not present

## 2022-04-30 DIAGNOSIS — R5381 Other malaise: Secondary | ICD-10-CM | POA: Diagnosis not present

## 2022-05-05 ENCOUNTER — Telehealth: Payer: Self-pay

## 2022-05-05 DIAGNOSIS — N400 Enlarged prostate without lower urinary tract symptoms: Secondary | ICD-10-CM | POA: Diagnosis not present

## 2022-05-05 DIAGNOSIS — K219 Gastro-esophageal reflux disease without esophagitis: Secondary | ICD-10-CM | POA: Diagnosis not present

## 2022-05-05 DIAGNOSIS — I442 Atrioventricular block, complete: Secondary | ICD-10-CM | POA: Diagnosis not present

## 2022-05-05 DIAGNOSIS — R4701 Aphasia: Secondary | ICD-10-CM | POA: Diagnosis not present

## 2022-05-05 DIAGNOSIS — N189 Chronic kidney disease, unspecified: Secondary | ICD-10-CM | POA: Diagnosis not present

## 2022-05-05 DIAGNOSIS — R5381 Other malaise: Secondary | ICD-10-CM | POA: Diagnosis not present

## 2022-05-05 DIAGNOSIS — I2510 Atherosclerotic heart disease of native coronary artery without angina pectoris: Secondary | ICD-10-CM | POA: Diagnosis not present

## 2022-05-05 DIAGNOSIS — R600 Localized edema: Secondary | ICD-10-CM | POA: Diagnosis not present

## 2022-05-05 DIAGNOSIS — H04123 Dry eye syndrome of bilateral lacrimal glands: Secondary | ICD-10-CM | POA: Diagnosis not present

## 2022-05-05 DIAGNOSIS — I10 Essential (primary) hypertension: Secondary | ICD-10-CM | POA: Diagnosis not present

## 2022-05-05 DIAGNOSIS — J189 Pneumonia, unspecified organism: Secondary | ICD-10-CM | POA: Diagnosis not present

## 2022-05-05 DIAGNOSIS — U071 COVID-19: Secondary | ICD-10-CM | POA: Diagnosis not present

## 2022-05-05 NOTE — Telephone Encounter (Signed)
     Patient  visit on 4/16  at [facility]  Have you been able to follow up with your primary care physician? Yes   The patient was or was not able to obtain any needed medicine or equipment. Yes   Are there diet recommendations that you are having difficulty following? Yes   Patient expresses understanding of discharge instructions and education provided has no other needs at this time.  Yes      Lenard Forth [facility] Guide, [facility] [phone] [address] [place], [place], [place] [address] Phone: [phone] Email: [email or web address]

## 2022-05-06 DIAGNOSIS — L821 Other seborrheic keratosis: Secondary | ICD-10-CM | POA: Diagnosis not present

## 2022-05-06 DIAGNOSIS — L814 Other melanin hyperpigmentation: Secondary | ICD-10-CM | POA: Diagnosis not present

## 2022-05-06 DIAGNOSIS — D2262 Melanocytic nevi of left upper limb, including shoulder: Secondary | ICD-10-CM | POA: Diagnosis not present

## 2022-05-06 DIAGNOSIS — Z08 Encounter for follow-up examination after completed treatment for malignant neoplasm: Secondary | ICD-10-CM | POA: Diagnosis not present

## 2022-05-06 DIAGNOSIS — Z85820 Personal history of malignant melanoma of skin: Secondary | ICD-10-CM | POA: Diagnosis not present

## 2022-05-06 DIAGNOSIS — D2261 Melanocytic nevi of right upper limb, including shoulder: Secondary | ICD-10-CM | POA: Diagnosis not present

## 2022-05-06 DIAGNOSIS — Z86006 Personal history of melanoma in-situ: Secondary | ICD-10-CM | POA: Diagnosis not present

## 2022-05-06 DIAGNOSIS — D225 Melanocytic nevi of trunk: Secondary | ICD-10-CM | POA: Diagnosis not present

## 2022-05-07 DIAGNOSIS — N400 Enlarged prostate without lower urinary tract symptoms: Secondary | ICD-10-CM | POA: Diagnosis not present

## 2022-05-07 DIAGNOSIS — F0390 Unspecified dementia without behavioral disturbance: Secondary | ICD-10-CM | POA: Diagnosis not present

## 2022-05-07 DIAGNOSIS — R4701 Aphasia: Secondary | ICD-10-CM | POA: Diagnosis not present

## 2022-05-07 DIAGNOSIS — I2510 Atherosclerotic heart disease of native coronary artery without angina pectoris: Secondary | ICD-10-CM | POA: Diagnosis not present

## 2022-05-07 DIAGNOSIS — N189 Chronic kidney disease, unspecified: Secondary | ICD-10-CM | POA: Diagnosis not present

## 2022-05-07 DIAGNOSIS — I442 Atrioventricular block, complete: Secondary | ICD-10-CM | POA: Diagnosis not present

## 2022-05-07 DIAGNOSIS — H04123 Dry eye syndrome of bilateral lacrimal glands: Secondary | ICD-10-CM | POA: Diagnosis not present

## 2022-05-07 DIAGNOSIS — E785 Hyperlipidemia, unspecified: Secondary | ICD-10-CM | POA: Diagnosis not present

## 2022-05-07 DIAGNOSIS — R600 Localized edema: Secondary | ICD-10-CM | POA: Diagnosis not present

## 2022-05-07 DIAGNOSIS — K219 Gastro-esophageal reflux disease without esophagitis: Secondary | ICD-10-CM | POA: Diagnosis not present

## 2022-05-07 DIAGNOSIS — R5381 Other malaise: Secondary | ICD-10-CM | POA: Diagnosis not present

## 2022-05-07 DIAGNOSIS — I10 Essential (primary) hypertension: Secondary | ICD-10-CM | POA: Diagnosis not present

## 2022-05-12 DIAGNOSIS — I480 Paroxysmal atrial fibrillation: Secondary | ICD-10-CM | POA: Diagnosis not present

## 2022-05-12 DIAGNOSIS — K219 Gastro-esophageal reflux disease without esophagitis: Secondary | ICD-10-CM | POA: Diagnosis not present

## 2022-05-12 DIAGNOSIS — H04123 Dry eye syndrome of bilateral lacrimal glands: Secondary | ICD-10-CM | POA: Diagnosis not present

## 2022-05-12 DIAGNOSIS — I2510 Atherosclerotic heart disease of native coronary artery without angina pectoris: Secondary | ICD-10-CM | POA: Diagnosis not present

## 2022-05-12 DIAGNOSIS — G2581 Restless legs syndrome: Secondary | ICD-10-CM | POA: Diagnosis not present

## 2022-05-12 DIAGNOSIS — R100 Acute abdomen: Secondary | ICD-10-CM | POA: Diagnosis not present

## 2022-05-12 DIAGNOSIS — D649 Anemia, unspecified: Secondary | ICD-10-CM | POA: Diagnosis not present

## 2022-05-12 DIAGNOSIS — R4182 Altered mental status, unspecified: Secondary | ICD-10-CM | POA: Diagnosis not present

## 2022-05-12 DIAGNOSIS — E559 Vitamin D deficiency, unspecified: Secondary | ICD-10-CM | POA: Diagnosis not present

## 2022-05-12 DIAGNOSIS — N400 Enlarged prostate without lower urinary tract symptoms: Secondary | ICD-10-CM | POA: Diagnosis not present

## 2022-05-12 DIAGNOSIS — I10 Essential (primary) hypertension: Secondary | ICD-10-CM | POA: Diagnosis not present

## 2022-05-12 DIAGNOSIS — E785 Hyperlipidemia, unspecified: Secondary | ICD-10-CM | POA: Diagnosis not present

## 2022-05-13 DIAGNOSIS — D649 Anemia, unspecified: Secondary | ICD-10-CM | POA: Diagnosis not present

## 2022-05-13 DIAGNOSIS — R3989 Other symptoms and signs involving the genitourinary system: Secondary | ICD-10-CM | POA: Diagnosis not present

## 2022-05-13 DIAGNOSIS — N390 Urinary tract infection, site not specified: Secondary | ICD-10-CM | POA: Diagnosis not present

## 2022-05-15 ENCOUNTER — Emergency Department (HOSPITAL_COMMUNITY)
Admission: EM | Admit: 2022-05-15 | Discharge: 2022-05-16 | Disposition: A | Discharge status: Skilled Nursing Facility | Payer: Medicare Other | Attending: Emergency Medicine | Admitting: Emergency Medicine

## 2022-05-15 DIAGNOSIS — I2510 Atherosclerotic heart disease of native coronary artery without angina pectoris: Secondary | ICD-10-CM | POA: Insufficient documentation

## 2022-05-15 DIAGNOSIS — Y92129 Unspecified place in nursing home as the place of occurrence of the external cause: Secondary | ICD-10-CM | POA: Diagnosis not present

## 2022-05-15 DIAGNOSIS — X58XXXA Exposure to other specified factors, initial encounter: Secondary | ICD-10-CM | POA: Diagnosis not present

## 2022-05-15 DIAGNOSIS — R310 Gross hematuria: Secondary | ICD-10-CM | POA: Diagnosis not present

## 2022-05-15 DIAGNOSIS — I480 Paroxysmal atrial fibrillation: Secondary | ICD-10-CM | POA: Diagnosis not present

## 2022-05-15 DIAGNOSIS — I129 Hypertensive chronic kidney disease with stage 1 through stage 4 chronic kidney disease, or unspecified chronic kidney disease: Secondary | ICD-10-CM | POA: Insufficient documentation

## 2022-05-15 DIAGNOSIS — R319 Hematuria, unspecified: Secondary | ICD-10-CM | POA: Diagnosis not present

## 2022-05-15 DIAGNOSIS — S3730XA Unspecified injury of urethra, initial encounter: Secondary | ICD-10-CM | POA: Insufficient documentation

## 2022-05-15 DIAGNOSIS — R3989 Other symptoms and signs involving the genitourinary system: Secondary | ICD-10-CM | POA: Diagnosis not present

## 2022-05-15 DIAGNOSIS — F0390 Unspecified dementia without behavioral disturbance: Secondary | ICD-10-CM | POA: Insufficient documentation

## 2022-05-15 DIAGNOSIS — K219 Gastro-esophageal reflux disease without esophagitis: Secondary | ICD-10-CM | POA: Diagnosis not present

## 2022-05-15 DIAGNOSIS — D649 Anemia, unspecified: Secondary | ICD-10-CM | POA: Insufficient documentation

## 2022-05-15 DIAGNOSIS — Z7982 Long term (current) use of aspirin: Secondary | ICD-10-CM | POA: Insufficient documentation

## 2022-05-15 DIAGNOSIS — Z79899 Other long term (current) drug therapy: Secondary | ICD-10-CM | POA: Diagnosis not present

## 2022-05-15 DIAGNOSIS — N189 Chronic kidney disease, unspecified: Secondary | ICD-10-CM | POA: Insufficient documentation

## 2022-05-15 DIAGNOSIS — I10 Essential (primary) hypertension: Secondary | ICD-10-CM | POA: Diagnosis not present

## 2022-05-15 DIAGNOSIS — R5381 Other malaise: Secondary | ICD-10-CM | POA: Diagnosis not present

## 2022-05-15 LAB — CBC WITH DIFFERENTIAL/PLATELET
Abs Immature Granulocytes: 0.08 10*3/uL — ABNORMAL HIGH (ref 0.00–0.07)
Basophils Absolute: 0 10*3/uL (ref 0.0–0.1)
Basophils Relative: 0 %
Eosinophils Absolute: 0 10*3/uL (ref 0.0–0.5)
Eosinophils Relative: 0 %
HCT: 26 % — ABNORMAL LOW (ref 39.0–52.0)
Hemoglobin: 8 g/dL — ABNORMAL LOW (ref 13.0–17.0)
Immature Granulocytes: 1 %
Lymphocytes Relative: 10 %
Lymphs Abs: 1.1 10*3/uL (ref 0.7–4.0)
MCH: 24.2 pg — ABNORMAL LOW (ref 26.0–34.0)
MCHC: 30.8 g/dL (ref 30.0–36.0)
MCV: 78.8 fL — ABNORMAL LOW (ref 80.0–100.0)
Monocytes Absolute: 0.8 10*3/uL (ref 0.1–1.0)
Monocytes Relative: 7 %
Neutro Abs: 9.4 10*3/uL — ABNORMAL HIGH (ref 1.7–7.7)
Neutrophils Relative %: 82 %
Platelets: 214 10*3/uL (ref 150–400)
RBC: 3.3 MIL/uL — ABNORMAL LOW (ref 4.22–5.81)
RDW: 16.5 % — ABNORMAL HIGH (ref 11.5–15.5)
WBC: 11.4 10*3/uL — ABNORMAL HIGH (ref 4.0–10.5)
nRBC: 0 % (ref 0.0–0.2)

## 2022-05-15 LAB — PROTIME-INR
INR: 1.1 (ref 0.8–1.2)
Prothrombin Time: 14.4 seconds (ref 11.4–15.2)

## 2022-05-15 LAB — BASIC METABOLIC PANEL
Anion gap: 9 (ref 5–15)
BUN: 26 mg/dL — ABNORMAL HIGH (ref 8–23)
CO2: 25 mmol/L (ref 22–32)
Calcium: 8.4 mg/dL — ABNORMAL LOW (ref 8.9–10.3)
Chloride: 103 mmol/L (ref 98–111)
Creatinine, Ser: 1.49 mg/dL — ABNORMAL HIGH (ref 0.61–1.24)
GFR, Estimated: 45 mL/min — ABNORMAL LOW (ref >60)
Glucose, Bld: 128 mg/dL — ABNORMAL HIGH (ref 70–99)
Potassium: 4.3 mmol/L (ref 3.5–5.1)
Sodium: 137 mmol/L (ref 135–145)

## 2022-05-15 LAB — TYPE AND SCREEN
ABO/RH(D): O POS
Antibody Screen: NEGATIVE

## 2022-05-15 NOTE — Discharge Instructions (Signed)
Avoid urine catheters.

## 2022-05-15 NOTE — ED Notes (Signed)
External cath placed as per MD request

## 2022-05-15 NOTE — ED Provider Notes (Signed)
[place] EMERGENCY DEPARTMENT AT [facility] Provider Note   CSN: [id number] Arrival date & time: 05/15/22  1932     History  Chief Complaint  Patient presents with   Hematuria    Brandon Fischer is a 89 y.o. male.  Pt is a 89 yo male with pmhx significant for dementia, HTN, kidney stones, gerd, psvt, hld, afib (no thinners), cad, complete hear block s/p pacemaker placement, and CKD.  Pt lives in a SNF.  It is unclear why, but the SNF has been trying to cath him daily to try to get a UA.  Today, he had blood in his urine, so he was sent here for further eval.  Pt is unable to give any hx.       Home Medications Prior to Admission medications   Medication Sig Start Date End Date Taking? Authorizing Provider  albuterol (VENTOLIN HFA) 108 (90 Base) MCG/ACT inhaler Inhale 2 puffs into the lungs every 4 (four) hours as needed for wheezing or shortness of breath.    [provider]  ascorbic acid (VITAMIN C) 500 MG tablet Take 500 mg by mouth in the morning.    [provider]  aspirin (ASPIRIN CHILDRENS) 81 MG chewable tablet Chew 1 tablet (81 mg total) by mouth daily. 07/20/20   Yates Decamp, MD  atorvastatin (LIPITOR) 20 MG tablet Take 20 mg by mouth every evening.    [provider]  Cholecalciferol (VITAMIN D-3) 25 MCG (1000 UT) CAPS Take 1,000 Units by mouth daily with breakfast.    [provider]  hydrALAZINE (APRESOLINE) 10 MG tablet Take 1 tablet (10 mg total) by mouth 2 (two) times daily. Patient not taking: Reported on 04/23/2022 02/06/22 04/23/22  Hartley Barefoot A, MD  hydrALAZINE (APRESOLINE) 25 MG tablet Take 25 mg by mouth 3 (three) times daily.    [provider]  metoprolol succinate (TOPROL-XL) 25 MG 24 hr tablet TAKE 1 TABLET (25 MG TOTAL) BY MOUTH DAILY. Patient taking differently: Take 25 mg by mouth in the morning. 07/15/21 02/01/23  Yates Decamp, MD  Multiple Vitamins-Iron (MULTIVITAMIN/IRON PO) Take 1 tablet by  mouth daily with breakfast.    [provider]  pantoprazole (PROTONIX) 40 MG tablet Take 40 mg by mouth in the morning and at bedtime.     [provider]  REFRESH TEARS 0.5 % SOLN Place 1 drop into both eyes in the morning and at bedtime.    [provider]  tamsulosin (FLOMAX) 0.4 MG CAPS capsule Take 0.4 mg by mouth every evening.    [provider]  traMADol (ULTRAM) 50 MG tablet Take 1 tablet (50 mg total) by mouth every 6 (six) hours as needed for moderate pain or severe pain. Post-operatively Patient taking differently: Take 50 mg by mouth every 6 (six) hours as needed (for pain). 02/06/22   Regalado, Belkys A, MD  vitamin B-12 100 MCG tablet Take 1 tablet (100 mcg total) by mouth daily. Patient not taking: Reported on 04/23/2022 02/07/22   Hartley Barefoot A, MD  zinc gluconate 50 MG tablet Take 50 mg by mouth daily.    [provider]      Allergies    Bupropion, Trazodone, and Olmesartan    Review of Systems   Review of Systems  Unable to perform ROS: Dementia  All other systems reviewed and are negative.   Physical Exam Updated Vital Signs BP (!) 163/63 (BP Location: Right Arm)   Pulse 86  Temp 98.2 F (36.8 C) (Oral)   Resp 20   Wt 81.6 kg   SpO2 99%   BMI 25.83 kg/m  Physical Exam Vitals and nursing note reviewed. Exam conducted with a chaperone present.  Constitutional:      Appearance: Normal appearance.  HENT:     Head: Normocephalic and atraumatic.     Right Ear: External ear normal.     Left Ear: External ear normal.     Nose: Nose normal.     Mouth/Throat:     Mouth: Mucous membranes are moist.     Pharynx: Oropharynx is clear.  Eyes:     Extraocular Movements: Extraocular movements intact.     Conjunctiva/sclera: Conjunctivae normal.     Pupils: Pupils are equal, round, and reactive to light.  Cardiovascular:     Rate and Rhythm: Normal rate and regular rhythm.     Pulses: Normal pulses.     Heart  sounds: Normal heart sounds.  Pulmonary:     Effort: Pulmonary effort is normal.     Breath sounds: Normal breath sounds.  Abdominal:     General: Abdomen is flat. Bowel sounds are normal.     Palpations: Abdomen is soft.  Genitourinary:    Penis: Uncircumcised.      Comments: Blood clot under foreskin.  This is cleaned and removed. Musculoskeletal:        General: Normal range of motion.     Cervical back: Normal range of motion and neck supple.  Skin:    General: Skin is warm.     Capillary Refill: Capillary refill takes less than 2 seconds.  Neurological:     General: No focal deficit present.     Mental Status: He is alert. Mental status is at baseline.  Psychiatric:        Mood and Affect: Mood normal.        Behavior: Behavior normal.     ED Results / Procedures / Treatments   Labs (all labs ordered are listed, but only abnormal results are displayed) Labs Reviewed  BASIC METABOLIC PANEL - Abnormal; Notable for the following components:      Result Value   Glucose, Bld 128 (*)    BUN 26 (*)    Creatinine, Ser 1.49 (*)    Calcium 8.4 (*)    GFR, Estimated 45 (*)    All other components within normal limits  CBC WITH DIFFERENTIAL/PLATELET - Abnormal; Notable for the following components:   WBC 11.4 (*)    RBC 3.30 (*)    Hemoglobin 8.0 (*)    HCT 26.0 (*)    MCV 78.8 (*)    MCH 24.2 (*)    RDW 16.5 (*)    Neutro Abs 9.4 (*)    Abs Immature Granulocytes 0.08 (*)    All other components within normal limits  URINALYSIS, ROUTINE W REFLEX MICROSCOPIC - Abnormal; Notable for the following components:   Color, Urine RED (*)    APPearance CLOUDY (*)    Hgb urine dipstick LARGE (*)    Protein, ur >=300 (*)    Leukocytes,Ua TRACE (*)    All other components within normal limits  PROTIME-INR  TYPE AND SCREEN    EKG None  Radiology No results found.  Procedures Procedures    Medications Ordered in ED Medications - No data to display  ED Course/ Medical  Decision Making/ A&P  Medical Decision Making Amount and/or Complexity of Data Reviewed Labs: ordered.   This patient presents to the ED for concern of hematuria, this involves an extensive number of treatment options, and is a complaint that carries with it a high risk of complications and morbidity.  The differential diagnosis includes urethral trauma, uti   Co morbidities that complicate the patient evaluation  dementia, HTN, kidney stones, gerd, psvt, hld, afib (no thinners), cad, complete hear block s/p pacemaker placement, and CKD   Additional history obtained:  Additional history obtained from epic chart review External records from outside source obtained and reviewed including EMS report   Lab Tests:  I Ordered, and personally interpreted labs.  The pertinent results include:  cbc with hgb 8,0 (pt has several hgbs from facility which are 7.8 to 8 range), cmp with bun 26 and cr 1.49 (chronic)    Medicines ordered and prescription drug management:   I have reviewed the patients home medicines and have made adjustments as needed   Problem List / ED Course:  Hematuria:  likely due to trauma from multiple cath attempts.  Pt is urinating well and there is no sign of urinary retention.  We placed a condom cath to collect the urine to avoid further trauma.  Pt can f/u with urology as an outpatient. Anemia:  chronic.  This can be worked up as an outpatient.  Daughter updated.   Reevaluation:  After the interventions noted above, I reevaluated the patient and found that they have :improved   Social Determinants of Health:  Lives in a SNF   Dispostion:  After consideration of the diagnostic results and the patients response to treatment, I feel that the patent would benefit from discharge with outpatient f/u.          Final Clinical Impression(s) / ED Diagnoses Final diagnoses:  Urethral trauma, initial encounter  Gross  hematuria  Chronic anemia    Rx / DC Orders ED Discharge Orders     None         Jacalyn Lefevre, MD 05/16/22 0040

## 2022-05-15 NOTE — ED Notes (Deleted)
Date and time results received: 05/15/22 20:33 (use smartphrase ".now" to insert current time)  Test: platelet  Critical Value: 20  Name of Provider Notified: Jacalyn Lefevre MD  Orders Received? Or Actions Taken?: Orders Received - See Orders for details

## 2022-05-15 NOTE — ED Triage Notes (Signed)
BIB EMS/ from [facility] SNF/ hx of dementia at baseline/ SNF staff reports blood in urine after multiple days of attempting to get urine sample via cath, multiple unsuccessful caths/ urine showed no signs of infection last week/ pt denies any pain at this time

## 2022-05-16 ENCOUNTER — Telehealth: Payer: Self-pay | Admitting: Hematology

## 2022-05-16 DIAGNOSIS — Z7401 Bed confinement status: Secondary | ICD-10-CM | POA: Diagnosis not present

## 2022-05-16 DIAGNOSIS — R319 Hematuria, unspecified: Secondary | ICD-10-CM | POA: Diagnosis not present

## 2022-05-16 DIAGNOSIS — I10 Essential (primary) hypertension: Secondary | ICD-10-CM | POA: Diagnosis not present

## 2022-05-16 LAB — URINALYSIS, ROUTINE W REFLEX MICROSCOPIC
Bacteria, UA: NONE SEEN
Bilirubin Urine: NEGATIVE
Glucose, UA: NEGATIVE mg/dL
Ketones, ur: NEGATIVE mg/dL
Nitrite: NEGATIVE
Protein, ur: >=300 mg/dL — AB
RBC / HPF: >50 RBC/hpf (ref 0–5)
Specific Gravity, Urine: 1.011 (ref 1.005–1.030)
pH: 7 (ref 5.0–8.0)

## 2022-05-16 NOTE — Telephone Encounter (Signed)
Olegario Messier called from facility patient is currenlty at, wanted to verify time and date of appointments.

## 2022-05-16 NOTE — ED Notes (Signed)
Ptar called, no eta 

## 2022-05-19 ENCOUNTER — Other Ambulatory Visit: Payer: Self-pay

## 2022-05-19 DIAGNOSIS — N189 Chronic kidney disease, unspecified: Secondary | ICD-10-CM | POA: Diagnosis not present

## 2022-05-19 DIAGNOSIS — E785 Hyperlipidemia, unspecified: Secondary | ICD-10-CM | POA: Diagnosis not present

## 2022-05-19 DIAGNOSIS — I10 Essential (primary) hypertension: Secondary | ICD-10-CM | POA: Diagnosis not present

## 2022-05-19 DIAGNOSIS — R5381 Other malaise: Secondary | ICD-10-CM | POA: Diagnosis not present

## 2022-05-19 DIAGNOSIS — I2510 Atherosclerotic heart disease of native coronary artery without angina pectoris: Secondary | ICD-10-CM | POA: Diagnosis not present

## 2022-05-19 DIAGNOSIS — R319 Hematuria, unspecified: Secondary | ICD-10-CM | POA: Diagnosis not present

## 2022-05-19 DIAGNOSIS — N400 Enlarged prostate without lower urinary tract symptoms: Secondary | ICD-10-CM | POA: Diagnosis not present

## 2022-05-19 DIAGNOSIS — F0390 Unspecified dementia without behavioral disturbance: Secondary | ICD-10-CM | POA: Diagnosis not present

## 2022-05-19 DIAGNOSIS — D649 Anemia, unspecified: Secondary | ICD-10-CM

## 2022-05-19 DIAGNOSIS — H04123 Dry eye syndrome of bilateral lacrimal glands: Secondary | ICD-10-CM | POA: Diagnosis not present

## 2022-05-19 DIAGNOSIS — G2581 Restless legs syndrome: Secondary | ICD-10-CM | POA: Diagnosis not present

## 2022-05-19 DIAGNOSIS — R4701 Aphasia: Secondary | ICD-10-CM | POA: Diagnosis not present

## 2022-05-19 DIAGNOSIS — I442 Atrioventricular block, complete: Secondary | ICD-10-CM | POA: Diagnosis not present

## 2022-05-20 ENCOUNTER — Other Ambulatory Visit: Payer: Self-pay

## 2022-05-20 ENCOUNTER — Inpatient Hospital Stay: Payer: Medicare Other | Attending: Hematology

## 2022-05-20 ENCOUNTER — Inpatient Hospital Stay (HOSPITAL_BASED_OUTPATIENT_CLINIC_OR_DEPARTMENT_OTHER): Payer: Medicare Other | Admitting: Hematology

## 2022-05-20 VITALS — BP 127/56 | HR 72 | Temp 98.2°F | Resp 18

## 2022-05-20 DIAGNOSIS — F0390 Unspecified dementia without behavioral disturbance: Secondary | ICD-10-CM | POA: Diagnosis not present

## 2022-05-20 DIAGNOSIS — D61818 Other pancytopenia: Secondary | ICD-10-CM | POA: Diagnosis not present

## 2022-05-20 DIAGNOSIS — D696 Thrombocytopenia, unspecified: Secondary | ICD-10-CM | POA: Insufficient documentation

## 2022-05-20 DIAGNOSIS — Z8616 Personal history of COVID-19: Secondary | ICD-10-CM | POA: Diagnosis not present

## 2022-05-20 DIAGNOSIS — D709 Neutropenia, unspecified: Secondary | ICD-10-CM | POA: Insufficient documentation

## 2022-05-20 DIAGNOSIS — R296 Repeated falls: Secondary | ICD-10-CM | POA: Diagnosis not present

## 2022-05-20 DIAGNOSIS — D649 Anemia, unspecified: Secondary | ICD-10-CM

## 2022-05-20 DIAGNOSIS — I10 Essential (primary) hypertension: Secondary | ICD-10-CM | POA: Diagnosis not present

## 2022-05-20 LAB — CMP (CANCER CENTER ONLY)
ALT: 20 U/L (ref 0–44)
AST: 31 U/L (ref 15–41)
Albumin: 3.5 g/dL (ref 3.5–5.0)
Alkaline Phosphatase: 109 U/L (ref 38–126)
Anion gap: 6 (ref 5–15)
BUN: 28 mg/dL — ABNORMAL HIGH (ref 8–23)
CO2: 29 mmol/L (ref 22–32)
Calcium: 8.5 mg/dL — ABNORMAL LOW (ref 8.9–10.3)
Chloride: 103 mmol/L (ref 98–111)
Creatinine: 1.43 mg/dL — ABNORMAL HIGH (ref 0.61–1.24)
GFR, Estimated: 47 mL/min — ABNORMAL LOW (ref >60)
Glucose, Bld: 145 mg/dL — ABNORMAL HIGH (ref 70–99)
Potassium: 4.5 mmol/L (ref 3.5–5.1)
Sodium: 138 mmol/L (ref 135–145)
Total Bilirubin: 0.8 mg/dL (ref 0.3–1.2)
Total Protein: 6.2 g/dL — ABNORMAL LOW (ref 6.5–8.1)

## 2022-05-20 LAB — CBC WITH DIFFERENTIAL (CANCER CENTER ONLY)
Abs Immature Granulocytes: 0.04 10*3/uL (ref 0.00–0.07)
Basophils Absolute: 0 10*3/uL (ref 0.0–0.1)
Basophils Relative: 0 %
Eosinophils Absolute: 0 10*3/uL (ref 0.0–0.5)
Eosinophils Relative: 0 %
HCT: 26.5 % — ABNORMAL LOW (ref 39.0–52.0)
Hemoglobin: 8.5 g/dL — ABNORMAL LOW (ref 13.0–17.0)
Immature Granulocytes: 1 %
Lymphocytes Relative: 8 %
Lymphs Abs: 0.5 10*3/uL — ABNORMAL LOW (ref 0.7–4.0)
MCH: 24.8 pg — ABNORMAL LOW (ref 26.0–34.0)
MCHC: 32.1 g/dL (ref 30.0–36.0)
MCV: 77.3 fL — ABNORMAL LOW (ref 80.0–100.0)
Monocytes Absolute: 0.5 10*3/uL (ref 0.1–1.0)
Monocytes Relative: 8 %
Neutro Abs: 5.2 10*3/uL (ref 1.7–7.7)
Neutrophils Relative %: 83 %
Platelet Count: 215 10*3/uL (ref 150–400)
RBC: 3.43 MIL/uL — ABNORMAL LOW (ref 4.22–5.81)
RDW: 17.3 % — ABNORMAL HIGH (ref 11.5–15.5)
WBC Count: 6.2 10*3/uL (ref 4.0–10.5)
nRBC: 0 % (ref 0.0–0.2)

## 2022-05-20 LAB — FERRITIN: Ferritin: 257 ng/mL (ref 24–336)

## 2022-05-20 LAB — IRON AND IRON BINDING CAPACITY (CC-WL,HP ONLY)
Iron: 42 ug/dL — ABNORMAL LOW (ref 45–182)
Saturation Ratios: 22 % (ref 17.9–39.5)
TIBC: 192 ug/dL — ABNORMAL LOW (ref 250–450)
UIBC: 150 ug/dL (ref 117–376)

## 2022-05-20 LAB — LACTATE DEHYDROGENASE: LDH: 207 U/L — ABNORMAL HIGH (ref 98–192)

## 2022-05-20 NOTE — Progress Notes (Signed)
[name]    HEMATOLOGY/ONCOLOGY CLINIC NOTE  Date of Service: 05/20/2022  Patient Care Team: Merri Brunette, MD as PCP - General (Internal Medicine)  CHIEF COMPLAINTS/PURPOSE OF CONSULTATION:  Evaluation and management of anemia.  HISTORY OF PRESENTING ILLNESS:   Please see previous note for details on initial presentation  INTERVAL HISTORY  Brandon Fischer is a 89 y.o. male who returns today for management and evaluation of his anemia.  Patient's last seen by me on 10/21/2021 and he was doing well overall.   Patient was unable to provide any information as he was unable to speak. Patient has dementia. Patient has had multiple falls, COVID-19 infection, urethral trauma, and head laceration.    MEDICAL HISTORY:  Past Medical History:  Diagnosis Date   Bilateral carotid artery stenosis without cerebral infarction    ASYMPTOMATIC--  BILATERAL ICA  50-69%  PER CARDIOLOGIST NOTE, DR Jacinto Halim   Bilateral carotid bruits    LEFT  soft bruit, right no no carotid bruit per 07-24-2014 dr Jacinto Halim note   CKD (chronic kidney disease)    dr foster Robbie Lis kidney, ckd stage 3 per lov 02-16-2019   Complete heart block (HCC) 04/25/2021   Complication of anesthesia    1 episode atrial fib 02-09-2014 [facility] and followed up with cardiology, not further issue   Coronary artery disease    NON-OBSTRUCTIVE CAD  per cath 08-06-2004 HX PALPITATIONS-AND -TACHYCARDIA-   Encounter for care of pacemaker 04/26/2021   First degree heart block    Full dentures    GERD (gastroesophageal reflux disease)    Heart palpitations    Not any more   History of atrial fibrillation without current medication    EPISODE OF AFIB WITH RVR INTRAOPERATIVELY 02-09-2014 AT [facility]--  RESOLVED AND PT FOLLOWED UP WITH CARDIOLOGIST  DR Jacinto Halim   History of kidney stones    Hyperlipidemia    Hypertension    Memory loss    Nephrolithiasis    RIGHT   Organic impotence    Pacemaker: [employer] ASSURITY DR-RF - [id number]  04/26/2021   Psoriasis    Clearing up   PSVT (paroxysmal supraventricular tachycardia)    Right ureteral calculus    S/P radiofrequency ablation operation for arrhythmia-SVT 04/28/2014   Simple renal cyst    right   Wears glasses    Wears hearing aid    BILATERAL  History of malignant melanoma status post Mohs surgery to remove the skin lesion on the face with skin grafting. 3 years ago.  SURGICAL HISTORY: Past Surgical History:  Procedure Laterality Date   CARDIAC CATHETERIZATION  08-06-2004 dr Reyes Ivan   (abnormal stress test) Non-obstructive CAD, pLAD 20-30%,  LCX 30-40%, pRCA 20-30%, dRCA 40%, distal LM  mild diffuse calcifation 20-30% tapering stenosis,  preserved LVSF ef 55-60%   CARPAL TUNNEL RELEASE Bilateral 2005   CATARACT EXTRACTION, BILATERAL Bilateral 2011   CHOLECYSTECTOMY     CHOLECYSTECTOMY OPEN  1982   and NEPHROLITHOTOMY   COLONOSCOPY W/ POLYPECTOMY  last one 04-05-2008   CYSTOSCOPY WITH LITHOLAPAXY Right 04/14/2017   Procedure: CYSTOSCOPY WITH LITHOLAPAXY/RIGHT RETROGRADE PLYOGRAM/RIGHT URETEROSCOPY;  Surgeon: Bjorn Pippin, MD;  Location: [facility];  Service: Urology;  Laterality: Right;   CYSTOSCOPY WITH RETROGRADE PYELOGRAM, URETEROSCOPY AND STENT PLACEMENT Right 03/14/2014   Procedure: CYSTO/RIGHT RETROGRADE PYELOGRAM/RIGHT URETEROSCOPY/STONE EXTRACTION/STENT PLACEMENT;  Surgeon: Anner Crete, MD;  Location: [facility];  Service: Urology;  Laterality: Right;   CYSTOSCOPY WITH RETROGRADE PYELOGRAM, URETEROSCOPY AND STENT PLACEMENT Left 04/19/2019  Procedure: CYSTOSCOPY WITH LEFT  RETROGRADE LEFT , URETEROSCOPY WITH HOLMIUM LASER, BASKET EXTRACTION AND STENT PLACEMENT;  Surgeon: Bjorn Pippin, MD;  Location: [facility];  Service: Urology;  Laterality: Left;   CYSTOSCOPY WITH RETROGRADE PYELOGRAM, URETEROSCOPY AND STENT PLACEMENT Left 03/30/2020   Procedure: CYSTOSCOPY WITH RETROGRADE PYELOGRAM, URETEROSCOPY, BASKETTING OF STONE AND STENT  PLACEMENT;  Surgeon: Sebastian Ache, MD;  Location: [facility];  Service: Urology;  Laterality: Left;   EYE SURGERY Bilateral    Cataracts removed   HOLMIUM LASER APPLICATION Right 03/14/2014   Procedure: RIGHT HOLMIUM LASER APPLICATION;  Surgeon: Anner Crete, MD;  Location: [facility];  Service: Urology;  Laterality: Right;   KNEE ARTHROSCOPY W/ DEBRIDEMENT Right 11/23/2002   and Synovectomy   LUMBAR LAMINECTOMY/DECOMPRESSION MICRODISCECTOMY N/A 07/15/2018   Procedure: Posterior lumbar decompression and fusion L4-5;  Surgeon: Venita Lick, MD;  Location[facility];  Service: Orthopedics;  Laterality: N/A;  4 hrs   NEPHROLITHOTOMY  01/16/2011   Procedure: NEPHROLITHOTOMY PERCUTANEOUS;  Surgeon: Anner Crete;  Location: [facility];  Service: Urology;  Laterality: Left;  C-Arm  Holmium Laser   NEPHROLITHOTOMY  1981   PACEMAKER IMPLANT N/A 04/26/2021   Procedure: PACEMAKER IMPLANT;  Surgeon: Regan Lemming, MD;  Location: [facility];  Service: Cardiovascular;  Laterality: N/A;   SUPRAVENTRICULAR TACHYCARDIA ABLATION N/A 04/28/2014   Procedure: SUPRAVENTRICULAR TACHYCARDIA ABLATION;  Surgeon: Marinus Maw, MD;  Location[facility];  Service: Cardiovascular;  Laterality: N/A;    SOCIAL HISTORY: Social History   Socioeconomic History   Marital status: Widowed    Spouse name: Not on file   Number of children: 2   Years of education: Not on file   Highest education level: Some college, no degree  Occupational History   Occupation: postal service     Comment: retired  Tobacco Use   Smoking status: Former    Packs/day: 1.00    Years: 40.00    Additional pack years: 0.00    Total pack years: 40.00    Types: Cigarettes    Quit date: 02/06/1998    Years since quitting: 24.2   Smokeless tobacco: Never  Vaping Use   Vaping Use: Never used  Substance and Sexual Activity   Alcohol use: No   Drug use: No   Sexual activity: Not on file  Other  Topics Concern   Not on file  Social History Narrative   06/25/21 lives alone, 2 children are in and out all day, has Medical alert   Social Determinants of Health   Financial Resource Strain: Not on file  Food Insecurity: No Food Insecurity (02/01/2022)   Hunger Vital Sign    Worried About Running Out of Food in the Last Year: Never true    Ran Out of Food in the Last Year: Never true  Transportation Needs: No Transportation Needs (02/01/2022)   PRAPARE - Administrator, Civil Service (Medical): No    Lack of Transportation (Non-Medical): No  Physical Activity: Not on file  Stress: Not on file  Social Connections: Not on file  Intimate Partner Violence: Not At Risk (02/01/2022)   Humiliation, Afraid, Rape, and Kick questionnaire    Fear of Current or Ex-Partner: No    Emotionally Abused: No    Physically Abused: No    Sexually Abused: No    FAMILY HISTORY: Family History  Problem Relation Age of Onset   Cancer Mother  Ovarian or colon   Heart attack Father    Heart attack Brother     ALLERGIES:  is allergic to bupropion, trazodone, and olmesartan.  MEDICATIONS:  Current Outpatient Medications  Medication Sig Dispense Refill   albuterol (VENTOLIN HFA) 108 (90 Base) MCG/ACT inhaler Inhale 2 puffs into the lungs every 4 (four) hours as needed for wheezing or shortness of breath.     ascorbic acid (VITAMIN C) 500 MG tablet Take 500 mg by mouth in the morning.     aspirin (ASPIRIN CHILDRENS) 81 MG chewable tablet Chew 1 tablet (81 mg total) by mouth daily.     atorvastatin (LIPITOR) 20 MG tablet Take 20 mg by mouth every evening.     Cholecalciferol (VITAMIN D-3) 25 MCG (1000 UT) CAPS Take 1,000 Units by mouth daily with breakfast.     hydrALAZINE (APRESOLINE) 10 MG tablet Take 1 tablet (10 mg total) by mouth 2 (two) times daily. (Patient not taking: Reported on 04/23/2022) 60 tablet 0   hydrALAZINE (APRESOLINE) 25 MG tablet Take 25 mg by mouth 3 (three) times  daily.     metoprolol succinate (TOPROL-XL) 25 MG 24 hr tablet TAKE 1 TABLET (25 MG TOTAL) BY MOUTH DAILY. (Patient taking differently: Take 25 mg by mouth in the morning.) 90 tablet 3   Multiple Vitamins-Iron (MULTIVITAMIN/IRON PO) Take 1 tablet by mouth daily with breakfast.     pantoprazole (PROTONIX) 40 MG tablet Take 40 mg by mouth in the morning and at bedtime.      REFRESH TEARS 0.5 % SOLN Place 1 drop into both eyes in the morning and at bedtime.     tamsulosin (FLOMAX) 0.4 MG CAPS capsule Take 0.4 mg by mouth every evening.     traMADol (ULTRAM) 50 MG tablet Take 1 tablet (50 mg total) by mouth every 6 (six) hours as needed for moderate pain or severe pain. Post-operatively (Patient taking differently: Take 50 mg by mouth every 6 (six) hours as needed (for pain).) 15 tablet 0   vitamin B-12 100 MCG tablet Take 1 tablet (100 mcg total) by mouth daily. (Patient not taking: Reported on 04/23/2022) 30 tablet 0   zinc gluconate 50 MG tablet Take 50 mg by mouth daily.     No current facility-administered medications for this visit.    REVIEW OF SYSTEMS:   .10 Point review of Systems was done is negative except as noted above.  PHYSICAL EXAMINATION:  ECOG FS:1 - Symptomatic but completely ambulatory  Vitals:   05/20/22 1334  BP: (!) 127/56  Pulse: 72  Resp: 18  Temp: 98.2 F (36.8 C)  SpO2: 100%     Wt Readings from Last 3 Encounters:  05/15/22 180 lb (81.6 kg)  04/28/22 175 lb (79.4 kg)  03/31/22 175 lb 9.6 oz (79.7 kg)   There is no height or weight on file to calculate BMI.   GENERAL:alert, in no acute distress and comfortable SKIN: no acute rashes, no significant lesions EYES: conjunctiva are pink and non-injected, sclera anicteric NECK: supple, no JVD LYMPH:  no palpable lymphadenopathy in the cervical, axillary or inguinal regions LUNGS: clear to auscultation b/l with normal respiratory effort HEART: regular rate & rhythm ABDOMEN:  normoactive bowel sounds , non  tender, not distended. Just palpable splenomegaly. Extremity: no pedal edema   Exam performed in chair.  LABORATORY DATA:  I have reviewed the data as listed  .    Latest Ref Rng & Units 05/20/2022    1:12 PM 05/15/2022  8:24 PM 04/28/2022    7:51 PM  CBC  WBC 4.0 - 10.5 K/uL 6.2  11.4  4.1   Hemoglobin 13.0 - 17.0 g/dL 8.5  8.0  16.1   Hematocrit 39.0 - 52.0 % 26.5  26.0  34.9   Platelets 150 - 400 K/uL 215  214  229     CBC    Component Value Date/Time   WBC 6.2 05/20/2022 1312   WBC 11.4 (H) 05/15/2022 2024   RBC 3.43 (L) 05/20/2022 1312   HGB 8.5 (L) 05/20/2022 1312   HGB 12.0 (L) 12/24/2016 1113   HCT 26.5 (L) 05/20/2022 1312   HCT 30.4 (L) 06/17/2021 1330   HCT 36.2 (L) 12/24/2016 1113   PLT 215 05/20/2022 1312   PLT 123 (L) 12/24/2016 1113   MCV 77.3 (L) 05/20/2022 1312   MCV 79.7 12/24/2016 1113   MCH 24.8 (L) 05/20/2022 1312   MCHC 32.1 05/20/2022 1312   RDW 17.3 (H) 05/20/2022 1312   RDW 16.2 (H) 12/24/2016 1113   LYMPHSABS 0.5 (L) 05/20/2022 1312   LYMPHSABS 0.7 (L) 12/24/2016 1113   MONOABS 0.5 05/20/2022 1312   MONOABS 0.4 12/24/2016 1113   EOSABS 0.0 05/20/2022 1312   EOSABS 0.0 12/24/2016 1113   BASOSABS 0.0 05/20/2022 1312   BASOSABS 0.0 12/24/2016 1113    .    Latest Ref Rng & Units 05/20/2022    1:12 PM 05/15/2022    8:24 PM 04/28/2022    7:51 PM  CMP  Glucose 70 - 99 mg/dL 096  045  409   BUN 8 - 23 mg/dL 28  26  31    Creatinine 0.61 - 1.24 mg/dL 8.11  9.14  7.82   Sodium 135 - 145 mmol/L 138  137  145   Potassium 3.5 - 5.1 mmol/L 4.5  4.3  3.9   Chloride 98 - 111 mmol/L 103  103  110   CO2 22 - 32 mmol/L 29  25  24    Calcium 8.9 - 10.3 mg/dL 8.5  8.4  9.3   Total Protein 6.5 - 8.1 g/dL 6.2   7.4   Total Bilirubin 0.3 - 1.2 mg/dL 0.8   0.9   Alkaline Phos 38 - 126 U/L 109   107   AST 15 - 41 U/L 31   33   ALT 0 - 44 U/L 20   30    . Lab Results  Component Value Date   LDH 207 (H) 05/20/2022    . Lab Results  Component Value  Date   IRON 42 (L) 05/20/2022   TIBC 192 (L) 05/20/2022   IRONPCTSAT 22 05/20/2022   (Iron and TIBC)  Lab Results  Component Value Date   FERRITIN 257 05/20/2022    Component     Latest Ref Rng & Units 09/09/2016  IgG (Immunoglobin G), Serum     700 - 1,600 mg/dL 956  IgA/Immunoglobulin A, Serum     61 - 437 mg/dL 213  IgM, Qn, Serum     15 - 143 mg/dL 43  Total Protein     6.0 - 8.5 g/dL 6.1  Albumin SerPl Elph-Mcnc     2.9 - 4.4 g/dL 3.7  Alpha 1     0.0 - 0.4 g/dL 0.3  Alpha2 Glob SerPl Elph-Mcnc     0.4 - 1.0 g/dL 0.5  B-Globulin SerPl Elph-Mcnc     0.7 - 1.3 g/dL 0.8  Gamma Glob SerPl Elph-Mcnc     0.4 -  1.8 g/dL 0.7  M Protein SerPl Elph-Mcnc     Not Observed g/dL Not Observed  Globulin, Total     2.2 - 3.9 g/dL 2.4  Albumin/Glob SerPl     0.7 - 1.7 1.6  IFE 1      Comment  Please Note (HCV):      Comment  Iron     42 - 163 ug/dL 64  TIBC     098 - 119 ug/dL 147 (L)  UIBC     829 - 376 ug/dL 562  %SAT     20 - 55 % 33  Folate, Hemolysate     Not Estab. ng/mL 369.8  HCT     37.5 - 51.0 % 28.0 (L)  Folate, RBC     >498 ng/mL 1,321  Ig Kappa Free Light Chain     3.3 - 19.4 mg/L 21.4 (H)  Ig Lambda Free Light Chain     5.7 - 26.3 mg/L 36.6 (H)  Kappa/Lambda FluidC Ratio     0.26 - 1.65 0.58  LDH     125 - 245 U/L 326 (H)  Sed Rate     0 - 30 mm/hr 2  Vitamin B12     232 - 1245 pg/mL 1,261 (H)  TSH     0.320 - 4.118 m(IU)/L 2.609  Hep C Virus Ab     0.0 - 0.9 s/co ratio 0.1  Ferritin     22 - 316 ng/ml 399 (H)   Component     Latest Ref Rng & Units 09/17/2016 09/18/2016  Prothrombin Time     11.4 - 15.2 seconds  14.2  INR       1.11  LDH     125 - 245 U/L 309 (H)   Haptoglobin     34 - 200 mg/dL <13 (L)   Coombs', Direct     Negative Negative   APTT     24 - 36 seconds  32   Component     Latest Ref Rng & Units 09/30/2016  RA Latex Turbid.     0.0 - 13.9 IU/mL <10.0  CCP Antibodies IgG/IgA     0 - 19 units 12  ANA Ab, IFA       Negative  Sed Rate     0 - 30 mm/hr 2   Component     Latest Ref Rng & Units 09/30/2016 09/30/2016 09/30/2016 10/22/2016         3:23 PM  3:23 PM  3:23 PM   Interpretation       Comment    Comment:       Comment Comment   Specimen:       Peripheral Blood    Submitted Dx:       Comment    Viability:       97%    Cell Population       Comment    Granulocytes:       Comment    Monocytes:       Comment    Antibodies Performed:       Comment    Director Review       Comment    RA Latex Turbid.     0.0 - 13.9 IU/mL <10.0     CCP Antibodies IgG/IgA     0 - 19 units 12     ANA Ab, IFA      Negative     LDH  125 - 245 U/L    185           RADIOGRAPHIC STUDIES: I have personally reviewed the radiological images as listed and agreed with the findings in the report. DG Chest 2 View  Result Date: 04/28/2022 CLINICAL DATA:  Coughing and altered mental status. EXAM: CHEST - 2 VIEW COMPARISON:  CTA chest 04/23/2022, portable chest 04/23/2022 FINDINGS: There is mild cardiomegaly without overt CHF with stable appearance of a left chest dual lead pacing system and wires. There are multiple overlying monitor wires. There is calcification in the transverse aorta. The mediastinum is normally outlined. The sulci are sharp. The lungs are radiographically clear. There was a patchy hazy left lower lobe infiltrate on the CT which either has resolved or is resolving and below the limits of radiographic resolution. There is bridging enthesopathy and marginal osteophytes throughout the thoracic spine. Mild osteopenia. IMPRESSION: 1. No acute radiographic chest findings. 2. Mild cardiomegaly. 3. Aortic atherosclerosis. 4. Left lower lobe patchy infiltrate seen on CT 5 days ago is either resolved or resolving and below the limits of radiographic resolution. Electronically Signed   By: Almira Bar M.D.   On: 04/28/2022 20:17   CT Cervical Spine Wo Contrast  Result Date: 04/28/2022 CLINICAL  DATA:  Status post trauma. EXAM: CT CERVICAL SPINE WITHOUT CONTRAST TECHNIQUE: Multidetector CT imaging of the cervical spine was performed without intravenous contrast. Multiplanar CT image reconstructions were also generated. RADIATION DOSE REDUCTION: This exam was performed according to the departmental dose-optimization program which includes automated exposure control, adjustment of the mA and/or kV according to patient size and/or use of iterative reconstruction technique. COMPARISON:  February 07, 2022 FINDINGS: Alignment: No acute subluxation. Skull base and vertebrae: No acute fracture. No primary bone lesion or focal pathologic process. Soft tissues and spinal canal: No prevertebral fluid or swelling. No visible canal hematoma. Disc levels: Marked severity endplate sclerosis, anterior osteophyte formation and posterior bony spurring are seen at the levels of C4-C5, C5-C6, C6-C7 and C7-T1. Moderate severity anterior osteophyte formation is also seen at C2-C3 and C3-C4. There is marked severity narrowing of the anterior atlantoaxial articulation. Marked severity intervertebral disc space narrowing is seen at C4-C5, C5-C6, C6-C7 and C7-T1. Bilateral marked severity multilevel facet joint hypertrophy is noted. Upper chest: Negative. Other: None. IMPRESSION: Marked severity multilevel degenerative changes without evidence of an acute fracture or subluxation. Electronically Signed   By: Aram Candela M.D.   On: 04/28/2022 19:18   CT Head Wo Contrast  Result Date: 04/28/2022 CLINICAL DATA:  Status post trauma. EXAM: CT HEAD WITHOUT CONTRAST TECHNIQUE: Contiguous axial images were obtained from the base of the skull through the vertex without intravenous contrast. RADIATION DOSE REDUCTION: This exam was performed according to the departmental dose-optimization program which includes automated exposure control, adjustment of the mA and/or kV according to patient size and/or use of iterative reconstruction  technique. COMPARISON:  February 07, 2022 FINDINGS: Brain: There is moderate severity cerebral atrophy with widening of the extra-axial spaces and ventricular dilatation. There are areas of decreased attenuation within the white matter tracts of the supratentorial brain, consistent with microvascular disease changes. Vascular: No hyperdense vessel or unexpected calcification. Skull: Normal. Negative for fracture or focal lesion. Sinuses/Orbits: There is mild left maxillary sinus and mild bilateral ethmoid sinus mucosal thickening. Other: Mild right frontal scalp soft tissue swelling is seen. IMPRESSION: 1. Mild right frontal scalp soft tissue swelling without evidence for an acute fracture or acute intracranial abnormality. 2. Generalized cerebral  atrophy with chronic white matter small vessel ischemic changes. 3. Mild left maxillary sinus and bilateral ethmoid sinus disease. Electronically Signed   By: Aram Candela M.D.   On: 04/28/2022 19:15   CT Angio Chest PE W and/or Wo Contrast  Result Date: 04/23/2022 CLINICAL DATA:  Shortness of breath and elevated D-dimer, COVID-19 positivity EXAM: CT ANGIOGRAPHY CHEST WITH CONTRAST TECHNIQUE: Multidetector CT imaging of the chest was performed using the standard protocol during bolus administration of intravenous contrast. Multiplanar CT image reconstructions and MIPs were obtained to evaluate the vascular anatomy. RADIATION DOSE REDUCTION: This exam was performed according to the departmental dose-optimization program which includes automated exposure control, adjustment of the mA and/or kV according to patient size and/or use of iterative reconstruction technique. CONTRAST:  80mL OMNIPAQUE IOHEXOL 350 MG/ML SOLN COMPARISON:  Chest x-ray from earlier in the same day FINDINGS: Cardiovascular: Atherosclerotic calcifications of the thoracic aorta are noted. No aneurysmal dilatation or dissection is noted. Heart is mildly enlarged in size. Pacing device is again  noted. The pulmonary artery shows a normal branching pattern bilaterally. No focal filling defect to suggest pulmonary embolism is noted. Mediastinum/Nodes: Thoracic inlet is within normal limits. The esophagus as visualized is within normal limits. No hilar or mediastinal adenopathy is noted. Lungs/Pleura: Lungs are well aerated bilaterally. Mild reticulonodular infiltrate is noted in the left lower lobe. No effusion or pneumothorax is seen. Upper Abdomen: Visualized upper abdomen shows changes of prior cholecystectomy. No acute abnormality is noted. Musculoskeletal: No acute rib abnormality is noted. Degenerative changes of the thoracic spine are seen. Review of the MIP images confirms the above findings. IMPRESSION: Mild early infiltrate in the left lower lobe. No evidence of pulmonary emboli. No other focal abnormality is noted. Aortic Atherosclerosis (ICD10-I70.0). Electronically Signed   By: Alcide Clever M.D.   On: 04/23/2022 22:17   DG Chest Portable 1 View  Result Date: 04/23/2022 CLINICAL DATA:  Cough EXAM: PORTABLE CHEST 1 VIEW COMPARISON:  Chest x-ray dated January 31, 2022 FINDINGS: Cardiac and mediastinal contours are unchanged. Left chest wall dual lead pacer, unchanged when compared with the prior exam. Lungs are clear. No evidence of pleural effusion or pneumothorax. IMPRESSION: No active disease. Electronically Signed   By: Allegra Lai M.D.   On: 04/23/2022 18:45    ASSESSMENT & PLAN:   #1 h/o Pancytopenia with normocytic Anemia, neutropenia and thrombocytopenia  This could certainly be from the patient's splenomegaly causing hypersplenism. Etiology of the splenomegaly is unclear.  09/18/16 Bone marrow showed no overt evidence of MDS/Myelofibrosis no evidence of acute leukemia Increased CD8+ T cell population ? Reactive vs clonal. Normal cytogenetics. Sed rate WNL neg ANA and Rheumatoid panel  No evidence of monoclonal paraproteinemia. TSH levels within normal limits B12,  folate and iron levels within normal limits. Hepatitis C negative  Given improvement in counts - the T cell population likely reactive and could be related to a viral infection that appears to be resolving now.  05/07/17 US Abdomen revealed decreased spleen size to about 900 cubic cm, and explains mild thrombocytopenia with PLT at 128k   10/02/17 US Abdomen revealed Status post cholecystectomy. Bilateral simple renal cysts are noted. Mild splenomegaly is noted with calculated volume of 630 cubic cm, which is decreased compared to prior exam.  #2  Currently Anemia - borderline microcytic with normal PLTs and WBC count  PLAN: -Discussed lab results from today, 05/20/2022, with the patient. CBC shows improved WBC counts from 11.4 to 6.2, decreased hemoglobin of  8.5, and decreased hematocrit of 26.5. CMP is stable. -Continue iron supplement.  -repeat labs in 6 months.  -recommended a family member to be there since the patient has cognitive problems.    FOLLOW-UP: -RTC with Dr Candise Che with labs in 6 months. (Patient member/HCPOA needs to be present to schedule clinic f/u -patient does not have decisional capacity)   The total time spent in the appointment was 20 minutes* .  All of the patient's questions were answered with apparent satisfaction. The patient knows to call the clinic with any problems, questions or concerns.   Wyvonnia Lora MD MS AAHIVMS [facility] Hematology/Oncology Physician [facility]  .*Total Encounter Time as defined by the Centers for Medicare and Medicaid Services includes, in addition to the face-to-face time of a patient visit (documented in the note above) non-face-to-face time: obtaining and reviewing outside history, ordering and reviewing medications, tests or procedures, care coordination (communications with other health care professionals or caregivers) and documentation in the medical record.   I, Ok Edwards, am acting as a Neurosurgeon for Wyvonnia Lora,  MD.  .I have reviewed the above documentation for accuracy and completeness, and I agree with the above. Johney Maine MD

## 2022-05-21 DIAGNOSIS — R319 Hematuria, unspecified: Secondary | ICD-10-CM | POA: Diagnosis not present

## 2022-05-21 DIAGNOSIS — R296 Repeated falls: Secondary | ICD-10-CM | POA: Diagnosis not present

## 2022-05-21 DIAGNOSIS — I2510 Atherosclerotic heart disease of native coronary artery without angina pectoris: Secondary | ICD-10-CM | POA: Diagnosis not present

## 2022-05-21 DIAGNOSIS — F0390 Unspecified dementia without behavioral disturbance: Secondary | ICD-10-CM | POA: Diagnosis not present

## 2022-05-21 DIAGNOSIS — N400 Enlarged prostate without lower urinary tract symptoms: Secondary | ICD-10-CM | POA: Diagnosis not present

## 2022-05-21 DIAGNOSIS — R5381 Other malaise: Secondary | ICD-10-CM | POA: Diagnosis not present

## 2022-05-21 DIAGNOSIS — K219 Gastro-esophageal reflux disease without esophagitis: Secondary | ICD-10-CM | POA: Diagnosis not present

## 2022-05-21 DIAGNOSIS — I10 Essential (primary) hypertension: Secondary | ICD-10-CM | POA: Diagnosis not present

## 2022-05-21 DIAGNOSIS — N189 Chronic kidney disease, unspecified: Secondary | ICD-10-CM | POA: Diagnosis not present

## 2022-05-21 DIAGNOSIS — E785 Hyperlipidemia, unspecified: Secondary | ICD-10-CM | POA: Diagnosis not present

## 2022-05-21 DIAGNOSIS — R109 Unspecified abdominal pain: Secondary | ICD-10-CM | POA: Diagnosis not present

## 2022-05-21 DIAGNOSIS — I480 Paroxysmal atrial fibrillation: Secondary | ICD-10-CM | POA: Diagnosis not present

## 2022-05-21 DIAGNOSIS — N19 Unspecified kidney failure: Secondary | ICD-10-CM | POA: Diagnosis not present

## 2022-05-21 DIAGNOSIS — I442 Atrioventricular block, complete: Secondary | ICD-10-CM | POA: Diagnosis not present

## 2022-05-23 DIAGNOSIS — R310 Gross hematuria: Secondary | ICD-10-CM | POA: Diagnosis not present

## 2022-05-23 DIAGNOSIS — N210 Calculus in bladder: Secondary | ICD-10-CM | POA: Diagnosis not present

## 2022-05-24 DIAGNOSIS — K567 Ileus, unspecified: Secondary | ICD-10-CM | POA: Diagnosis not present

## 2022-05-24 DIAGNOSIS — R6889 Other general symptoms and signs: Secondary | ICD-10-CM | POA: Diagnosis not present

## 2022-05-26 DIAGNOSIS — R296 Repeated falls: Secondary | ICD-10-CM | POA: Diagnosis not present

## 2022-05-26 DIAGNOSIS — I2510 Atherosclerotic heart disease of native coronary artery without angina pectoris: Secondary | ICD-10-CM | POA: Diagnosis not present

## 2022-05-26 DIAGNOSIS — N189 Chronic kidney disease, unspecified: Secondary | ICD-10-CM | POA: Diagnosis not present

## 2022-05-26 DIAGNOSIS — R319 Hematuria, unspecified: Secondary | ICD-10-CM | POA: Diagnosis not present

## 2022-05-26 DIAGNOSIS — R4701 Aphasia: Secondary | ICD-10-CM | POA: Diagnosis not present

## 2022-05-26 DIAGNOSIS — R5381 Other malaise: Secondary | ICD-10-CM | POA: Diagnosis not present

## 2022-05-26 DIAGNOSIS — K219 Gastro-esophageal reflux disease without esophagitis: Secondary | ICD-10-CM | POA: Diagnosis not present

## 2022-05-26 DIAGNOSIS — H04123 Dry eye syndrome of bilateral lacrimal glands: Secondary | ICD-10-CM | POA: Diagnosis not present

## 2022-05-26 DIAGNOSIS — K567 Ileus, unspecified: Secondary | ICD-10-CM | POA: Diagnosis not present

## 2022-05-26 DIAGNOSIS — N400 Enlarged prostate without lower urinary tract symptoms: Secondary | ICD-10-CM | POA: Diagnosis not present

## 2022-05-26 DIAGNOSIS — I10 Essential (primary) hypertension: Secondary | ICD-10-CM | POA: Diagnosis not present

## 2022-05-26 DIAGNOSIS — E785 Hyperlipidemia, unspecified: Secondary | ICD-10-CM | POA: Diagnosis not present

## 2022-05-26 DIAGNOSIS — F0390 Unspecified dementia without behavioral disturbance: Secondary | ICD-10-CM | POA: Diagnosis not present

## 2022-05-27 DIAGNOSIS — N179 Acute kidney failure, unspecified: Secondary | ICD-10-CM | POA: Diagnosis not present

## 2022-05-27 DIAGNOSIS — I10 Essential (primary) hypertension: Secondary | ICD-10-CM | POA: Diagnosis not present

## 2022-05-27 DIAGNOSIS — I2510 Atherosclerotic heart disease of native coronary artery without angina pectoris: Secondary | ICD-10-CM | POA: Diagnosis not present

## 2022-05-28 DIAGNOSIS — K5900 Constipation, unspecified: Secondary | ICD-10-CM | POA: Diagnosis not present

## 2022-05-28 DIAGNOSIS — K567 Ileus, unspecified: Secondary | ICD-10-CM | POA: Diagnosis not present

## 2022-05-28 DIAGNOSIS — M6259 Muscle wasting and atrophy, not elsewhere classified, multiple sites: Secondary | ICD-10-CM | POA: Diagnosis not present

## 2022-05-28 DIAGNOSIS — R1312 Dysphagia, oropharyngeal phase: Secondary | ICD-10-CM | POA: Diagnosis not present

## 2022-05-29 DIAGNOSIS — K567 Ileus, unspecified: Secondary | ICD-10-CM | POA: Diagnosis not present

## 2022-05-29 DIAGNOSIS — M6259 Muscle wasting and atrophy, not elsewhere classified, multiple sites: Secondary | ICD-10-CM | POA: Diagnosis not present

## 2022-05-29 DIAGNOSIS — F32A Depression, unspecified: Secondary | ICD-10-CM | POA: Diagnosis not present

## 2022-05-29 DIAGNOSIS — R1312 Dysphagia, oropharyngeal phase: Secondary | ICD-10-CM | POA: Diagnosis not present

## 2022-05-29 DIAGNOSIS — F02C2 Dementia in other diseases classified elsewhere, severe, with psychotic disturbance: Secondary | ICD-10-CM | POA: Diagnosis not present

## 2022-05-30 DIAGNOSIS — R1312 Dysphagia, oropharyngeal phase: Secondary | ICD-10-CM | POA: Diagnosis not present

## 2022-05-30 DIAGNOSIS — M6259 Muscle wasting and atrophy, not elsewhere classified, multiple sites: Secondary | ICD-10-CM | POA: Diagnosis not present

## 2022-06-02 DIAGNOSIS — M6259 Muscle wasting and atrophy, not elsewhere classified, multiple sites: Secondary | ICD-10-CM | POA: Diagnosis not present

## 2022-06-02 DIAGNOSIS — R1312 Dysphagia, oropharyngeal phase: Secondary | ICD-10-CM | POA: Diagnosis not present

## 2022-06-03 DIAGNOSIS — R1312 Dysphagia, oropharyngeal phase: Secondary | ICD-10-CM | POA: Diagnosis not present

## 2022-06-03 DIAGNOSIS — I10 Essential (primary) hypertension: Secondary | ICD-10-CM | POA: Diagnosis not present

## 2022-06-03 DIAGNOSIS — M6259 Muscle wasting and atrophy, not elsewhere classified, multiple sites: Secondary | ICD-10-CM | POA: Diagnosis not present

## 2022-06-04 DIAGNOSIS — I10 Essential (primary) hypertension: Secondary | ICD-10-CM | POA: Diagnosis not present

## 2022-06-04 DIAGNOSIS — F419 Anxiety disorder, unspecified: Secondary | ICD-10-CM | POA: Diagnosis not present

## 2022-06-04 DIAGNOSIS — M6259 Muscle wasting and atrophy, not elsewhere classified, multiple sites: Secondary | ICD-10-CM | POA: Diagnosis not present

## 2022-06-04 DIAGNOSIS — R4701 Aphasia: Secondary | ICD-10-CM | POA: Diagnosis not present

## 2022-06-04 DIAGNOSIS — H04123 Dry eye syndrome of bilateral lacrimal glands: Secondary | ICD-10-CM | POA: Diagnosis not present

## 2022-06-04 DIAGNOSIS — I2510 Atherosclerotic heart disease of native coronary artery without angina pectoris: Secondary | ICD-10-CM | POA: Diagnosis not present

## 2022-06-04 DIAGNOSIS — N189 Chronic kidney disease, unspecified: Secondary | ICD-10-CM | POA: Diagnosis not present

## 2022-06-04 DIAGNOSIS — R296 Repeated falls: Secondary | ICD-10-CM | POA: Diagnosis not present

## 2022-06-04 DIAGNOSIS — F0390 Unspecified dementia without behavioral disturbance: Secondary | ICD-10-CM | POA: Diagnosis not present

## 2022-06-04 DIAGNOSIS — R1312 Dysphagia, oropharyngeal phase: Secondary | ICD-10-CM | POA: Diagnosis not present

## 2022-06-04 DIAGNOSIS — R5381 Other malaise: Secondary | ICD-10-CM | POA: Diagnosis not present

## 2022-06-04 DIAGNOSIS — G2581 Restless legs syndrome: Secondary | ICD-10-CM | POA: Diagnosis not present

## 2022-06-04 DIAGNOSIS — E785 Hyperlipidemia, unspecified: Secondary | ICD-10-CM | POA: Diagnosis not present

## 2022-06-04 DIAGNOSIS — S0001XA Abrasion of scalp, initial encounter: Secondary | ICD-10-CM | POA: Diagnosis not present

## 2022-06-05 DIAGNOSIS — R1312 Dysphagia, oropharyngeal phase: Secondary | ICD-10-CM | POA: Diagnosis not present

## 2022-06-05 DIAGNOSIS — M6259 Muscle wasting and atrophy, not elsewhere classified, multiple sites: Secondary | ICD-10-CM | POA: Diagnosis not present

## 2022-06-06 DIAGNOSIS — M6259 Muscle wasting and atrophy, not elsewhere classified, multiple sites: Secondary | ICD-10-CM | POA: Diagnosis not present

## 2022-06-06 DIAGNOSIS — R1312 Dysphagia, oropharyngeal phase: Secondary | ICD-10-CM | POA: Diagnosis not present

## 2022-06-09 DIAGNOSIS — M6259 Muscle wasting and atrophy, not elsewhere classified, multiple sites: Secondary | ICD-10-CM | POA: Diagnosis not present

## 2022-06-09 DIAGNOSIS — R1312 Dysphagia, oropharyngeal phase: Secondary | ICD-10-CM | POA: Diagnosis not present

## 2022-06-10 DIAGNOSIS — R1312 Dysphagia, oropharyngeal phase: Secondary | ICD-10-CM | POA: Diagnosis not present

## 2022-06-10 DIAGNOSIS — M6259 Muscle wasting and atrophy, not elsewhere classified, multiple sites: Secondary | ICD-10-CM | POA: Diagnosis not present

## 2022-06-11 DIAGNOSIS — M6259 Muscle wasting and atrophy, not elsewhere classified, multiple sites: Secondary | ICD-10-CM | POA: Diagnosis not present

## 2022-06-11 DIAGNOSIS — R1312 Dysphagia, oropharyngeal phase: Secondary | ICD-10-CM | POA: Diagnosis not present

## 2022-06-11 DIAGNOSIS — D649 Anemia, unspecified: Secondary | ICD-10-CM | POA: Diagnosis not present

## 2022-06-12 ENCOUNTER — Encounter (HOSPITAL_COMMUNITY): Payer: Self-pay

## 2022-06-12 ENCOUNTER — Emergency Department (HOSPITAL_COMMUNITY)
Admission: EM | Admit: 2022-06-12 | Discharge: 2022-06-13 | Disposition: A | Discharge status: Home / Self Care | Payer: Medicare Other | Attending: Emergency Medicine | Admitting: Emergency Medicine

## 2022-06-12 ENCOUNTER — Emergency Department (HOSPITAL_COMMUNITY): Payer: Medicare Other

## 2022-06-12 DIAGNOSIS — S0001XA Abrasion of scalp, initial encounter: Secondary | ICD-10-CM | POA: Diagnosis not present

## 2022-06-12 DIAGNOSIS — I2510 Atherosclerotic heart disease of native coronary artery without angina pectoris: Secondary | ICD-10-CM | POA: Insufficient documentation

## 2022-06-12 DIAGNOSIS — E785 Hyperlipidemia, unspecified: Secondary | ICD-10-CM | POA: Diagnosis not present

## 2022-06-12 DIAGNOSIS — E559 Vitamin D deficiency, unspecified: Secondary | ICD-10-CM | POA: Diagnosis not present

## 2022-06-12 DIAGNOSIS — R58 Hemorrhage, not elsewhere classified: Secondary | ICD-10-CM | POA: Diagnosis not present

## 2022-06-12 DIAGNOSIS — N183 Chronic kidney disease, stage 3 unspecified: Secondary | ICD-10-CM | POA: Insufficient documentation

## 2022-06-12 DIAGNOSIS — S61212A Laceration without foreign body of right middle finger without damage to nail, initial encounter: Secondary | ICD-10-CM | POA: Diagnosis not present

## 2022-06-12 DIAGNOSIS — I10 Essential (primary) hypertension: Secondary | ICD-10-CM | POA: Diagnosis not present

## 2022-06-12 DIAGNOSIS — K219 Gastro-esophageal reflux disease without esophagitis: Secondary | ICD-10-CM | POA: Diagnosis not present

## 2022-06-12 DIAGNOSIS — Z043 Encounter for examination and observation following other accident: Secondary | ICD-10-CM | POA: Diagnosis not present

## 2022-06-12 DIAGNOSIS — N189 Chronic kidney disease, unspecified: Secondary | ICD-10-CM | POA: Diagnosis not present

## 2022-06-12 DIAGNOSIS — D649 Anemia, unspecified: Secondary | ICD-10-CM | POA: Diagnosis not present

## 2022-06-12 DIAGNOSIS — R296 Repeated falls: Secondary | ICD-10-CM | POA: Diagnosis not present

## 2022-06-12 DIAGNOSIS — I480 Paroxysmal atrial fibrillation: Secondary | ICD-10-CM | POA: Diagnosis not present

## 2022-06-12 DIAGNOSIS — I129 Hypertensive chronic kidney disease with stage 1 through stage 4 chronic kidney disease, or unspecified chronic kidney disease: Secondary | ICD-10-CM | POA: Diagnosis not present

## 2022-06-12 DIAGNOSIS — M6259 Muscle wasting and atrophy, not elsewhere classified, multiple sites: Secondary | ICD-10-CM | POA: Diagnosis not present

## 2022-06-12 DIAGNOSIS — F0390 Unspecified dementia without behavioral disturbance: Secondary | ICD-10-CM | POA: Insufficient documentation

## 2022-06-12 DIAGNOSIS — M19031 Primary osteoarthritis, right wrist: Secondary | ICD-10-CM | POA: Diagnosis not present

## 2022-06-12 DIAGNOSIS — G2581 Restless legs syndrome: Secondary | ICD-10-CM | POA: Diagnosis not present

## 2022-06-12 DIAGNOSIS — R1312 Dysphagia, oropharyngeal phase: Secondary | ICD-10-CM | POA: Diagnosis not present

## 2022-06-12 DIAGNOSIS — Z87891 Personal history of nicotine dependence: Secondary | ICD-10-CM | POA: Diagnosis not present

## 2022-06-12 DIAGNOSIS — W19XXXA Unspecified fall, initial encounter: Secondary | ICD-10-CM | POA: Insufficient documentation

## 2022-06-12 DIAGNOSIS — Y92129 Unspecified place in nursing home as the place of occurrence of the external cause: Secondary | ICD-10-CM | POA: Insufficient documentation

## 2022-06-12 DIAGNOSIS — S6991XA Unspecified injury of right wrist, hand and finger(s), initial encounter: Secondary | ICD-10-CM | POA: Diagnosis present

## 2022-06-12 NOTE — ED Provider Notes (Signed)
[facility] Emergency Department Provider Note MRN:  [id number]  Arrival date & time: 06/13/22     Chief Complaint   Fall   History of Present Illness   Brandon Fischer is a 89 y.o. year-old male with a history of dementia presenting to the ED with chief complaint of fall.  Unwitnessed fall from nursing home.  Patient seemed to be at his baseline but he had an injury to his right middle finger, sent here for concern for possible fracture.  Also has a laceration.  Review of Systems  A thorough review of systems was obtained and all systems are negative except as noted in the HPI and PMH.   Patient's Health History    Past Medical History:  Diagnosis Date   Bilateral carotid artery stenosis without cerebral infarction    ASYMPTOMATIC--  BILATERAL ICA  50-69%  PER CARDIOLOGIST NOTE, DR Jacinto Halim   Bilateral carotid bruits    LEFT  soft bruit, right no no carotid bruit per 07-24-2014 dr Jacinto Halim note   CKD (chronic kidney disease)    dr foster Robbie Lis kidney, ckd stage 3 per lov 02-16-2019   Complete heart block (HCC) 04/25/2021   Complication of anesthesia    1 episode atrial fib 02-09-2014 [facility] and followed up with cardiology, not further issue   Coronary artery disease    NON-OBSTRUCTIVE CAD  per cath 08-06-2004 HX PALPITATIONS-AND -TACHYCARDIA-   Encounter for care of pacemaker 04/26/2021   First degree heart block    Full dentures    GERD (gastroesophageal reflux disease)    Heart palpitations    Not any more   History of atrial fibrillation without current medication    EPISODE OF AFIB WITH RVR INTRAOPERATIVELY 02-09-2014 AT [facility]--  RESOLVED AND PT FOLLOWED UP WITH CARDIOLOGIST  DR Jacinto Halim   History of kidney stones    Hyperlipidemia    Hypertension    Memory loss    Nephrolithiasis    RIGHT   Organic impotence    Pacemaker: [employer] ASSURITY DR-RF - [id number] 04/26/2021   Psoriasis    Clearing up   PSVT (paroxysmal  supraventricular tachycardia)    Right ureteral calculus    S/P radiofrequency ablation operation for arrhythmia-SVT 04/28/2014   Simple renal cyst    right   Wears glasses    Wears hearing aid    BILATERAL    Past Surgical History:  Procedure Laterality Date   CARDIAC CATHETERIZATION  08-06-2004 dr Reyes Ivan   (abnormal stress test) Non-obstructive CAD, pLAD 20-30%,  LCX 30-40%, pRCA 20-30%, dRCA 40%, distal LM  mild diffuse calcifation 20-30% tapering stenosis,  preserved LVSF ef 55-60%   CARPAL TUNNEL RELEASE Bilateral 2005   CATARACT EXTRACTION, BILATERAL Bilateral 2011   CHOLECYSTECTOMY     CHOLECYSTECTOMY OPEN  1982   and NEPHROLITHOTOMY   COLONOSCOPY W/ POLYPECTOMY  last one 04-05-2008   CYSTOSCOPY WITH LITHOLAPAXY Right 04/14/2017   Procedure: CYSTOSCOPY WITH LITHOLAPAXY/RIGHT RETROGRADE PLYOGRAM/RIGHT URETEROSCOPY;  Surgeon: Bjorn Pippin, MD;  Location: [facility];  Service: Urology;  Laterality: Right;   CYSTOSCOPY WITH RETROGRADE PYELOGRAM, URETEROSCOPY AND STENT PLACEMENT Right 03/14/2014   Procedure: CYSTO/RIGHT RETROGRADE PYELOGRAM/RIGHT URETEROSCOPY/STONE EXTRACTION/STENT PLACEMENT;  Surgeon: Anner Crete, MD;  Location: [facility];  Service: Urology;  Laterality: Right;   CYSTOSCOPY WITH RETROGRADE PYELOGRAM, URETEROSCOPY AND STENT PLACEMENT Left 04/19/2019   Procedure: CYSTOSCOPY WITH LEFT  RETROGRADE LEFT , URETEROSCOPY WITH HOLMIUM LASER, BASKET EXTRACTION AND STENT PLACEMENT;  Surgeon:  Bjorn Pippin, MD;  Location: [facility];  Service: Urology;  Laterality: Left;   CYSTOSCOPY WITH RETROGRADE PYELOGRAM, URETEROSCOPY AND STENT PLACEMENT Left 03/30/2020   Procedure: CYSTOSCOPY WITH RETROGRADE PYELOGRAM, URETEROSCOPY, BASKETTING OF STONE AND STENT PLACEMENT;  Surgeon: Sebastian Ache, MD;  Location: [facility];  Service: Urology;  Laterality: Left;   EYE SURGERY Bilateral    Cataracts removed   HOLMIUM LASER APPLICATION Right  03/14/2014   Procedure: RIGHT HOLMIUM LASER APPLICATION;  Surgeon: Anner Crete, MD;  Location: [facility];  Service: Urology;  Laterality: Right;   KNEE ARTHROSCOPY W/ DEBRIDEMENT Right 11/23/2002   and Synovectomy   LUMBAR LAMINECTOMY/DECOMPRESSION MICRODISCECTOMY N/A 07/15/2018   Procedure: Posterior lumbar decompression and fusion L4-5;  Surgeon: Venita Lick, MD;  Location: [facility];  Service: Orthopedics;  Laterality: N/A;  4 hrs   NEPHROLITHOTOMY  01/16/2011   Procedure: NEPHROLITHOTOMY PERCUTANEOUS;  Surgeon: Anner Crete;  Location: [facility];  Service: Urology;  Laterality: Left;  C-Arm  Holmium Laser   NEPHROLITHOTOMY  1981   PACEMAKER IMPLANT N/A 04/26/2021   Procedure: PACEMAKER IMPLANT;  Surgeon: Regan Lemming, MD;  [facility];  Service: Cardiovascular;  Laterality: N/A;   SUPRAVENTRICULAR TACHYCARDIA ABLATION N/A 04/28/2014   Procedure: SUPRAVENTRICULAR TACHYCARDIA ABLATION;  Surgeon: Marinus Maw, MD;  Location: [facility] CATH LAB;  Service: Cardiovascular;  Laterality: N/A;    Family History  Problem Relation Age of Onset   Cancer Mother        Ovarian or colon   Heart attack Father    Heart attack Brother     Social History   Socioeconomic History   Marital status: Widowed    Spouse name: Not on file   Number of children: 2   Years of education: Not on file   Highest education level: Some college, no degree  Occupational History   Occupation: postal service     Comment: retired  Tobacco Use   Smoking status: Former    Packs/day: 1.00    Years: 40.00    Additional pack years: 0.00    Total pack years: 40.00    Types: Cigarettes    Quit date: 02/06/1998    Years since quitting: 24.3   Smokeless tobacco: Never  Vaping Use   Vaping Use: Never used  Substance and Sexual Activity   Alcohol use: No   Drug use: No   Sexual activity: Not on file  Other Topics Concern   Not on file  Social History Narrative   06/25/21 lives  alone, 2 children are in and out all day, has Medical alert   Social Determinants of Health   Financial Resource Strain: Not on file  Food Insecurity: No Food Insecurity (02/01/2022)   Hunger Vital Sign    Worried About Running Out of Food in the Last Year: Never true    Ran Out of Food in the Last Year: Never true  Transportation Needs: No Transportation Needs (02/01/2022)   PRAPARE - Administrator, Civil Service (Medical): No    Lack of Transportation (Non-Medical): No  Physical Activity: Not on file  Stress: Not on file  Social Connections: Not on file  Intimate Partner Violence: Not At Risk (02/01/2022)   Humiliation, Afraid, Rape, and Kick questionnaire    Fear of Current or Ex-Partner: No    Emotionally Abused: No    Physically Abused: No    Sexually Abused: No     Physical  Exam   Vitals:   06/12/22 2235  BP: (!) 142/80  Pulse: 75  Resp: 16  Temp: 98.8 F (37.1 C)  SpO2: 100%    CONSTITUTIONAL: Chronically ill-appearing, NAD NEURO/PSYCH: Awake, largely nonverbal, oriented to name, does not follow commands, moves all extremities equally EYES:  eyes equal and reactive ENT/NECK:  no LAD, no JVD CARDIO: Regular rate, well-perfused, normal S1 and S2 PULM:  CTAB no wheezing or rhonchi GI/GU:  non-distended, non-tender MSK/SPINE:  No gross deformities, no edema SKIN: Laceration to the dorsal aspect of right middle finger   *Additional and/or pertinent findings included in MDM below  Diagnostic and Interventional Summary    EKG Interpretation  Date/Time:    Ventricular Rate:    PR Interval:    QRS Duration:   QT Interval:    QTC Calculation:   R Axis:     Text Interpretation:         Labs Reviewed - No data to display  DG Wrist Complete Right  Final Result    DG Hand Complete Right  Final Result      Medications - No data to display   Procedures  /  Critical Care .[name] KitchenLaceration Repair  Date/Time: 06/13/2022 2:11 AM  Performed by:  Sabas Sous, MD Authorized by: Sabas Sous, MD   Consent:    Consent obtained:  Verbal   Consent given by:  Healthcare agent   Risks, benefits, and alternatives were discussed: yes     Risks discussed:  Infection, need for additional repair, nerve damage, poor wound healing, pain, poor cosmetic result, tendon damage, vascular damage and retained foreign body   Alternatives discussed:  No treatment Universal protocol:    Procedure explained and questions answered to patient or proxy's satisfaction: yes     Immediately prior to procedure, a time out was called: yes     Patient identity confirmed:  Verbally with patient and arm band Anesthesia:    Anesthesia method:  None Laceration details:    Location:  Finger   Finger location:  R long finger   Length (cm):  2   Depth (mm):  1 Pre-procedure details:    Preparation:  Patient was prepped and draped in usual sterile fashion Exploration:    Limited defect created (wound extended): no     Imaging obtained: x-ray     Imaging outcome: foreign body not noted     Wound exploration: wound explored through full range of motion and entire depth of wound visualized     Contaminated: no   Treatment:    Area cleansed with:  Saline Skin repair:    Repair method:  Tissue adhesive Approximation:    Approximation:  Close Repair type:    Repair type:  Simple Post-procedure details:    Dressing:  Open (no dressing)   Procedure completion:  Tolerated well, no immediate complications   ED Course and Medical Decision Making  Initial Impression and Ddx Question fracture of the finger.  Thorough physical exam is revealing no other significant signs of injuries.  Normal range of motion of the arms and legs, no midline spinal tenderness, soft abdomen, clear lungs, no signs of trauma to the head.  Is not anticoagulated.  Spoke with patient's daughter who confirms long history of dementia, seems to be at his baseline.  Past medical/surgical  history that increases complexity of ED encounter: Dementia  Interpretation of Diagnostics I personally reviewed the hand x-ray and my interpretation is as follows: No fracture  Patient Reassessment and Ultimate Disposition/Management     Laceration repaired as described above, appropriate for discharge.  Patient management required discussion with the following services or consulting groups:  None  Complexity of Problems Addressed Acute illness or injury that poses threat of life of bodily function  Additional Data Reviewed and Analyzed Further history obtained from: Further history from spouse/family member  Additional Factors Impacting ED Encounter Risk Minor Procedures  Elmer Sow. Pilar Plate, MD [facility] Emergency Medicine [facility] [email or web address]  Final Clinical Impressions(s) / ED Diagnoses     ICD-10-CM   1. Laceration of right middle finger without foreign body without damage to nail, initial encounter  W29.562Z       ED Discharge Orders     None        Discharge Instructions Discussed with and Provided to Patient:     Discharge Instructions      You were evaluated in the Emergency Department and after careful evaluation, we did not find any emergent condition requiring admission or further testing in the hospital.  Your exam/testing today is overall reassuring.  X-rays without any broken bones.  We repaired your laceration here in the emergency department with medical adhesive.  This will wear away with time.  Please return to the Emergency Department if you experience any worsening of your condition.   Thank you for allowing [place] to be a part of your care.       Sabas Sous, MD 06/13/22 [phone]

## 2022-06-12 NOTE — ED Triage Notes (Signed)
Pt is from [facility], Hx of dementia at baseline, staff reports and unwitnessed fall. Has a potential right hand/finger fracture with a laceration on knuckle. Pt is not on blood thinners.   99% 66hr 16rr 150/66  Pt is DNR and came with his paperwork

## 2022-06-13 ENCOUNTER — Emergency Department (HOSPITAL_COMMUNITY): Payer: Medicare Other

## 2022-06-13 DIAGNOSIS — R1312 Dysphagia, oropharyngeal phase: Secondary | ICD-10-CM | POA: Diagnosis not present

## 2022-06-13 DIAGNOSIS — R404 Transient alteration of awareness: Secondary | ICD-10-CM | POA: Diagnosis not present

## 2022-06-13 DIAGNOSIS — Z7401 Bed confinement status: Secondary | ICD-10-CM | POA: Diagnosis not present

## 2022-06-13 DIAGNOSIS — S61212A Laceration without foreign body of right middle finger without damage to nail, initial encounter: Secondary | ICD-10-CM | POA: Diagnosis not present

## 2022-06-13 DIAGNOSIS — M6259 Muscle wasting and atrophy, not elsewhere classified, multiple sites: Secondary | ICD-10-CM | POA: Diagnosis not present

## 2022-06-13 DIAGNOSIS — M19031 Primary osteoarthritis, right wrist: Secondary | ICD-10-CM | POA: Diagnosis not present

## 2022-06-13 NOTE — Discharge Instructions (Signed)
You were evaluated in the Emergency Department and after careful evaluation, we did not find any emergent condition requiring admission or further testing in the hospital.  Your exam/testing today is overall reassuring.  X-rays without any broken bones.  We repaired your laceration here in the emergency department with medical adhesive.  This will wear away with time.  Please return to the Emergency Department if you experience any worsening of your condition.   Thank you for allowing [place] to be a part of your care.

## 2022-06-15 DIAGNOSIS — R1312 Dysphagia, oropharyngeal phase: Secondary | ICD-10-CM | POA: Diagnosis not present

## 2022-06-15 DIAGNOSIS — M6259 Muscle wasting and atrophy, not elsewhere classified, multiple sites: Secondary | ICD-10-CM | POA: Diagnosis not present

## 2022-06-15 DIAGNOSIS — Z9181 History of falling: Secondary | ICD-10-CM | POA: Diagnosis not present

## 2022-06-15 DIAGNOSIS — I2510 Atherosclerotic heart disease of native coronary artery without angina pectoris: Secondary | ICD-10-CM | POA: Diagnosis not present

## 2022-06-16 DIAGNOSIS — Z9181 History of falling: Secondary | ICD-10-CM | POA: Diagnosis not present

## 2022-06-16 DIAGNOSIS — R1312 Dysphagia, oropharyngeal phase: Secondary | ICD-10-CM | POA: Diagnosis not present

## 2022-06-16 DIAGNOSIS — R296 Repeated falls: Secondary | ICD-10-CM | POA: Diagnosis not present

## 2022-06-16 DIAGNOSIS — F419 Anxiety disorder, unspecified: Secondary | ICD-10-CM | POA: Diagnosis not present

## 2022-06-16 DIAGNOSIS — R5381 Other malaise: Secondary | ICD-10-CM | POA: Diagnosis not present

## 2022-06-16 DIAGNOSIS — N400 Enlarged prostate without lower urinary tract symptoms: Secondary | ICD-10-CM | POA: Diagnosis not present

## 2022-06-16 DIAGNOSIS — N189 Chronic kidney disease, unspecified: Secondary | ICD-10-CM | POA: Diagnosis not present

## 2022-06-16 DIAGNOSIS — M6259 Muscle wasting and atrophy, not elsewhere classified, multiple sites: Secondary | ICD-10-CM | POA: Diagnosis not present

## 2022-06-16 DIAGNOSIS — H04123 Dry eye syndrome of bilateral lacrimal glands: Secondary | ICD-10-CM | POA: Diagnosis not present

## 2022-06-16 DIAGNOSIS — I2510 Atherosclerotic heart disease of native coronary artery without angina pectoris: Secondary | ICD-10-CM | POA: Diagnosis not present

## 2022-06-16 DIAGNOSIS — R4701 Aphasia: Secondary | ICD-10-CM | POA: Diagnosis not present

## 2022-06-16 DIAGNOSIS — S61212A Laceration without foreign body of right middle finger without damage to nail, initial encounter: Secondary | ICD-10-CM | POA: Diagnosis not present

## 2022-06-16 DIAGNOSIS — F0390 Unspecified dementia without behavioral disturbance: Secondary | ICD-10-CM | POA: Diagnosis not present

## 2022-06-16 DIAGNOSIS — I10 Essential (primary) hypertension: Secondary | ICD-10-CM | POA: Diagnosis not present

## 2022-06-16 DIAGNOSIS — G2581 Restless legs syndrome: Secondary | ICD-10-CM | POA: Diagnosis not present

## 2022-06-17 DIAGNOSIS — R1312 Dysphagia, oropharyngeal phase: Secondary | ICD-10-CM | POA: Diagnosis not present

## 2022-06-17 DIAGNOSIS — I2510 Atherosclerotic heart disease of native coronary artery without angina pectoris: Secondary | ICD-10-CM | POA: Diagnosis not present

## 2022-06-17 DIAGNOSIS — M6259 Muscle wasting and atrophy, not elsewhere classified, multiple sites: Secondary | ICD-10-CM | POA: Diagnosis not present

## 2022-06-17 DIAGNOSIS — Z9181 History of falling: Secondary | ICD-10-CM | POA: Diagnosis not present

## 2022-06-18 DIAGNOSIS — R1312 Dysphagia, oropharyngeal phase: Secondary | ICD-10-CM | POA: Diagnosis not present

## 2022-06-18 DIAGNOSIS — I2510 Atherosclerotic heart disease of native coronary artery without angina pectoris: Secondary | ICD-10-CM | POA: Diagnosis not present

## 2022-06-18 DIAGNOSIS — M6259 Muscle wasting and atrophy, not elsewhere classified, multiple sites: Secondary | ICD-10-CM | POA: Diagnosis not present

## 2022-06-18 DIAGNOSIS — Z9181 History of falling: Secondary | ICD-10-CM | POA: Diagnosis not present

## 2022-06-19 ENCOUNTER — Other Ambulatory Visit: Payer: Self-pay

## 2022-06-19 ENCOUNTER — Emergency Department (HOSPITAL_COMMUNITY): Payer: Medicare Other

## 2022-06-19 ENCOUNTER — Emergency Department (HOSPITAL_COMMUNITY)
Admission: EM | Admit: 2022-06-19 | Discharge: 2022-06-20 | Disposition: A | Discharge status: Home / Self Care | Payer: Medicare Other | Attending: Emergency Medicine | Admitting: Emergency Medicine

## 2022-06-19 DIAGNOSIS — I959 Hypotension, unspecified: Secondary | ICD-10-CM | POA: Diagnosis not present

## 2022-06-19 DIAGNOSIS — S0181XA Laceration without foreign body of other part of head, initial encounter: Secondary | ICD-10-CM | POA: Insufficient documentation

## 2022-06-19 DIAGNOSIS — I6782 Cerebral ischemia: Secondary | ICD-10-CM | POA: Diagnosis not present

## 2022-06-19 DIAGNOSIS — W050XXA Fall from non-moving wheelchair, initial encounter: Secondary | ICD-10-CM | POA: Insufficient documentation

## 2022-06-19 DIAGNOSIS — F0150 Vascular dementia without behavioral disturbance: Secondary | ICD-10-CM | POA: Insufficient documentation

## 2022-06-19 DIAGNOSIS — W19XXXA Unspecified fall, initial encounter: Secondary | ICD-10-CM

## 2022-06-19 DIAGNOSIS — I2510 Atherosclerotic heart disease of native coronary artery without angina pectoris: Secondary | ICD-10-CM | POA: Insufficient documentation

## 2022-06-19 DIAGNOSIS — Z7982 Long term (current) use of aspirin: Secondary | ICD-10-CM | POA: Diagnosis not present

## 2022-06-19 DIAGNOSIS — I10 Essential (primary) hypertension: Secondary | ICD-10-CM | POA: Insufficient documentation

## 2022-06-19 DIAGNOSIS — R58 Hemorrhage, not elsewhere classified: Secondary | ICD-10-CM | POA: Diagnosis not present

## 2022-06-19 DIAGNOSIS — Z950 Presence of cardiac pacemaker: Secondary | ICD-10-CM | POA: Diagnosis not present

## 2022-06-19 DIAGNOSIS — S199XXA Unspecified injury of neck, initial encounter: Secondary | ICD-10-CM | POA: Diagnosis not present

## 2022-06-19 DIAGNOSIS — R102 Pelvic and perineal pain: Secondary | ICD-10-CM | POA: Diagnosis not present

## 2022-06-19 DIAGNOSIS — S0990XA Unspecified injury of head, initial encounter: Secondary | ICD-10-CM | POA: Diagnosis present

## 2022-06-19 DIAGNOSIS — S0083XA Contusion of other part of head, initial encounter: Secondary | ICD-10-CM | POA: Diagnosis not present

## 2022-06-19 DIAGNOSIS — R1312 Dysphagia, oropharyngeal phase: Secondary | ICD-10-CM | POA: Diagnosis not present

## 2022-06-19 DIAGNOSIS — R079 Chest pain, unspecified: Secondary | ICD-10-CM | POA: Diagnosis not present

## 2022-06-19 DIAGNOSIS — Z79899 Other long term (current) drug therapy: Secondary | ICD-10-CM | POA: Insufficient documentation

## 2022-06-19 DIAGNOSIS — M6259 Muscle wasting and atrophy, not elsewhere classified, multiple sites: Secondary | ICD-10-CM | POA: Diagnosis not present

## 2022-06-19 DIAGNOSIS — Z9181 History of falling: Secondary | ICD-10-CM | POA: Diagnosis not present

## 2022-06-19 NOTE — ED Provider Notes (Signed)
[place] EMERGENCY DEPARTMENT AT [facility] Provider Note   CSN: [id number] Arrival date & time: 06/19/22  2029     History  Chief Complaint  Patient presents with   Laceration    Brandon Fischer is a 89 y.o. male with a past medical history significant for hypertension, CAD, complete heart block with pacemaker in place, history of vascular dementia who presents to the ED after a mechanical fall.  Per daughter at bedside patient was in his wheelchair and fell trying to get up hitting his head.  Unsure whether or not patient lost consciousness.  Patient has a laceration to forehead.  Last tetanus shot was this year per chart review.  Daughter notes patient is at baseline.  No other visible injuries.  Patient has a history of dementia.  Level 5 caveat.  History obtained from patient and past medical records. No interpreter used during encounter.       Home Medications Prior to Admission medications   Medication Sig Start Date End Date Taking? Authorizing Provider  albuterol (VENTOLIN HFA) 108 (90 Base) MCG/ACT inhaler Inhale 2 puffs into the lungs every 4 (four) hours as needed for wheezing or shortness of breath.    [provider]  ascorbic acid (VITAMIN C) 500 MG tablet Take 500 mg by mouth in the morning.    [provider]  aspirin (ASPIRIN CHILDRENS) 81 MG chewable tablet Chew 1 tablet (81 mg total) by mouth daily. 07/20/20   Yates Decamp, MD  atorvastatin (LIPITOR) 20 MG tablet Take 20 mg by mouth every evening.    [provider]  Cholecalciferol (VITAMIN D-3) 25 MCG (1000 UT) CAPS Take 1,000 Units by mouth daily with breakfast.    [provider]  hydrALAZINE (APRESOLINE) 10 MG tablet Take 1 tablet (10 mg total) by mouth 2 (two) times daily. Patient not taking: Reported on 04/23/2022 02/06/22 04/23/22  Hartley Barefoot A, MD  hydrALAZINE (APRESOLINE) 25 MG tablet Take 25 mg by mouth 3 (three) times daily.    [provider]   metoprolol succinate (TOPROL-XL) 25 MG 24 hr tablet TAKE 1 TABLET (25 MG TOTAL) BY MOUTH DAILY. Patient taking differently: Take 25 mg by mouth in the morning. 07/15/21 02/01/23  Yates Decamp, MD  Multiple Vitamins-Iron (MULTIVITAMIN/IRON PO) Take 1 tablet by mouth daily with breakfast.    [provider]  pantoprazole (PROTONIX) 40 MG tablet Take 40 mg by mouth in the morning and at bedtime.     [provider]  REFRESH TEARS 0.5 % SOLN Place 1 drop into both eyes in the morning and at bedtime.    [provider]  tamsulosin (FLOMAX) 0.4 MG CAPS capsule Take 0.4 mg by mouth every evening.    [provider]  traMADol (ULTRAM) 50 MG tablet Take 1 tablet (50 mg total) by mouth every 6 (six) hours as needed for moderate pain or severe pain. Post-operatively Patient taking differently: Take 50 mg by mouth every 6 (six) hours as needed (for pain). 02/06/22   Regalado, Belkys A, MD  vitamin B-12 100 MCG tablet Take 1 tablet (100 mcg total) by mouth daily. Patient not taking: Reported on 04/23/2022 02/07/22   Hartley Barefoot A, MD  zinc gluconate 50 MG tablet Take 50 mg by mouth daily.    [provider]      Allergies    Bupropion, Trazodone, and Olmesartan    Review of Systems   Review of Systems  Unable to perform ROS:  Dementia  Skin:  Positive for wound.    Physical Exam Updated Vital Signs BP (!) 132/58 (BP Location: Right Arm)   Pulse 69   Temp 99 F (37.2 C) (Oral)   Resp 18   SpO2 100%  Physical Exam Vitals and nursing note reviewed.  Constitutional:      General: He is not in acute distress.    Appearance: He is not ill-appearing.  HENT:     Head: Normocephalic.  Eyes:     Pupils: Pupils are equal, round, and reactive to light.  Cardiovascular:     Rate and Rhythm: Normal rate and regular rhythm.     Pulses: Normal pulses.     Heart sounds: Normal heart sounds. No murmur heard.    No friction rub. No gallop.  Pulmonary:      Effort: Pulmonary effort is normal.     Breath sounds: Normal breath sounds.  Abdominal:     General: Abdomen is flat. There is no distension.     Palpations: Abdomen is soft.     Tenderness: There is no abdominal tenderness. There is no guarding or rebound.  Musculoskeletal:        General: Normal range of motion.     Cervical back: Neck supple.  Skin:    General: Skin is warm and dry.     Comments: Laceration to forehead. Skin tear to scalp. See photo below  Neurological:     General: No focal deficit present.     Mental Status: He is alert.  Psychiatric:        Mood and Affect: Mood normal.        Behavior: Behavior normal.     ED Results / Procedures / Treatments   Labs (all labs ordered are listed, but only abnormal results are displayed) Labs Reviewed - No data to display  EKG None  Radiology CT Maxillofacial Wo Contrast  Result Date: 06/19/2022 CLINICAL DATA:  Fall from wheelchair with laceration to forehead and hematoma EXAM: CT MAXILLOFACIAL WITHOUT CONTRAST TECHNIQUE: Multidetector CT imaging of the maxillofacial structures was performed. Multiplanar CT image reconstructions were also generated. RADIATION DOSE REDUCTION: This exam was performed according to the departmental dose-optimization program which includes automated exposure control, adjustment of the mA and/or kV according to patient size and/or use of iterative reconstruction technique. COMPARISON:  None Available. FINDINGS: Osseous: No fracture or mandibular dislocation. No destructive process. Orbits: Negative. No traumatic or inflammatory finding. Sinuses: Mild mucosal thickening in the left maxillary sinus. The paranasal sinuses are otherwise well aerated Soft tissues: Right forehead small hematoma. Limited intracranial: Reported separately. IMPRESSION: 1. No acute facial bone fracture. Electronically Signed   By: Minerva Fester M.D.   On: 06/19/2022 22:26   CT Cervical Spine Wo Contrast  Result Date:  06/19/2022 CLINICAL DATA:  Neck trauma, fall from wheelchair. EXAM: CT CERVICAL SPINE WITHOUT CONTRAST TECHNIQUE: Multidetector CT imaging of the cervical spine was performed without intravenous contrast. Multiplanar CT image reconstructions were also generated. RADIATION DOSE REDUCTION: This exam was performed according to the departmental dose-optimization program which includes automated exposure control, adjustment of the mA and/or kV according to patient size and/or use of iterative reconstruction technique. COMPARISON:  04/28/2022. FINDINGS: Alignment: There is mild anterolisthesis at C3-C4, C5-C6, and C7-T1. Skull base and vertebrae: No acute fracture. Soft tissues and spinal canal: No prevertebral fluid or swelling. No visible canal hematoma. Disc levels: Multilevel intervertebral disc space narrowing, disc osteophyte formation, and facet arthropathy. Upper chest: No acute abnormality.  Other: Carotid artery calcifications. IMPRESSION: Multilevel degenerative changes in the cervical spine without evidence of acute fracture. Electronically Signed   By: Thornell Sartorius M.D.   On: 06/19/2022 22:21   CT Head Wo Contrast  Result Date: 06/19/2022 CLINICAL DATA:  Trauma EXAM: CT HEAD WITHOUT CONTRAST TECHNIQUE: Contiguous axial images were obtained from the base of the skull through the vertex without intravenous contrast. RADIATION DOSE REDUCTION: This exam was performed according to the departmental dose-optimization program which includes automated exposure control, adjustment of the mA and/or kV according to patient size and/or use of iterative reconstruction technique. COMPARISON:  MRI head 02/06/2022 FINDINGS: Brain: No evidence of acute infarction, hemorrhage, hydrocephalus, extra-axial collection or mass lesion/mass effect. There is stable moderate diffuse atrophy and mild periventricular white matter hypodensity, likely chronic small vessel ischemic change. Vascular: Atherosclerotic calcifications are  present within the cavernous internal carotid arteries. Skull: Normal. Negative for fracture or focal lesion. Sinuses/Orbits: No acute finding. Other: There is right frontal scalp soft tissue swelling. IMPRESSION: 1. No acute intracranial process. 2. Right frontal scalp soft tissue swelling. 3. Stable moderate diffuse atrophy and mild chronic small vessel ischemic change. Electronically Signed   By: Darliss Cheney M.D.   On: 06/19/2022 22:19   DG Chest Portable 1 View  Result Date: 06/19/2022 CLINICAL DATA:  Recent fall with chest pain, initial encounter EXAM: PORTABLE CHEST 1 VIEW COMPARISON:  04/28/2022 FINDINGS: Cardiac shadow is enlarged. Pacing device is again seen and stable. Aortic calcifications are noted. The lungs are clear. No acute bony abnormality is noted. IMPRESSION: No acute abnormality noted. Electronically Signed   By: Alcide Clever M.D.   On: 06/19/2022 21:38   DG Pelvis Portable  Result Date: 06/19/2022 CLINICAL DATA:  Recent fall with pelvic pain, initial encounter EXAM: PORTABLE PELVIS 1-2 VIEWS COMPARISON:  02/22/2019 FINDINGS: Pelvic ring is intact. No acute fracture or dislocation is noted. Postsurgical changes in the lower lumbar spine are noted. No soft tissue changes are seen. IMPRESSION: No acute abnormality noted. Electronically Signed   By: Alcide Clever M.D.   On: 06/19/2022 21:37    Procedures .[name] KitchenLaceration Repair  Date/Time: 06/19/2022 10:49 PM  Performed by: Mannie Stabile, PA-C Authorized by: Mannie Stabile, PA-C   Consent:    Consent obtained:  Verbal   Consent given by: patient's daughter.   Risks, benefits, and alternatives were discussed: yes     Risks discussed:  Poor cosmetic result, poor wound healing and infection   Alternatives discussed:  No treatment Universal protocol:    Procedure explained and questions answered to patient or proxy's satisfaction: yes     Relevant documents present and verified: yes     Test results available: yes      Imaging studies available: yes     Required blood products, implants, devices, and special equipment available: yes     Site/side marked: yes     Immediately prior to procedure, a time out was called: yes     Patient identity confirmed:  Arm band Anesthesia:    Anesthesia method:  None Laceration details:    Location:  Face   Face location:  Forehead   Length (cm):  3   Depth (mm):  0.5 Pre-procedure details:    Preparation:  Patient was prepped and draped in usual sterile fashion and imaging obtained to evaluate for foreign bodies Exploration:    Limited defect created (wound extended): no     Hemostasis achieved with:  Direct pressure   Imaging  obtained comment:  CT   Imaging outcome: foreign body not noted     Wound exploration: wound explored through full range of motion     Wound extent: no underlying fracture     Contaminated: no   Treatment:    Area cleansed with:  Saline   Amount of cleaning:  Standard   Irrigation solution:  Sterile saline   Irrigation volume:  50   Irrigation method:  Syringe   Visualized foreign bodies/material removed: no     Debridement:  None   Undermining:  None   Scar revision: no   Skin repair:    Repair method:  Tissue adhesive Approximation:    Approximation:  Close Repair type:    Repair type:  Simple Post-procedure details:    Dressing:  Bulky dressing and non-adherent dressing   Procedure completion:  Tolerated well, no immediate complications     Medications Ordered in ED Medications - No data to display  ED Course/ Medical Decision Making/ A&P                             Medical Decision Making Amount and/or Complexity of Data Reviewed Independent Historian: caregiver External Data Reviewed: notes. Radiology: ordered and independent interpretation performed. Decision-making details documented in ED Course.   This patient presents to the ED for concern of fall, this involves an extensive number of treatment options, and  is a complaint that carries with it a high risk of complications and morbidity.  The differential diagnosis includes intracranial bleed, bony fracture, etc  89 year old male with history of vascular dementia presents to the ED after a witnessed mechanical fall.  Patient fell from his wheelchair and hit his head.  On ASA 81 mg; however, no other blood thinners.  Daughter at bedside provided history.  Patient is at his baseline per daughter.  Upon arrival, stable vitals.  Patient in no acute distress.  Physical exam significant for laceration to forehead.  Superficial skin tear to scalp.  No signs of basilar skull fracture.  No cervical midline tenderness.  Patient moving all 4 extremities without difficulty.  CT scans ordered.  Pelvic and CXR ordered. Tetanus up to date. Patient at baseline. Witnessed mechanical fall, do not feel labs would be beneficial at this time.  CT head, maxillofacial, and cervical spine negative for any acute abnormalities.  No intracranial bleed.  No bony fractures.  Chest x-ray pelvis x-ray negative for any bony fractures.  Laceration repair as noted above. Patient stable for discharge. At baseline per daughter. Strict ED precautions discussed with patient. Patient states understanding and agrees to plan. Patient discharged home in no acute distress and stable vitals.  Live at living facility Hx dementia  Discussed with Dr. Suezanne Jacquet who agrees with assessment and plan.       Final Clinical Impression(s) / ED Diagnoses Final diagnoses:  Laceration of forehead, initial encounter  Fall, initial encounter    Rx / DC Orders ED Discharge Orders     None         Jesusita Oka 06/19/22 2255    Lonell Grandchild, MD 06/26/22 [id number]

## 2022-06-19 NOTE — ED Triage Notes (Signed)
Per EMS  Fall From wheelchair Foot caught in wheel Lac forehead Hematoma Forehead Skin tear Top of head  BP 130/60 HR 80 RR 18 O2 98%

## 2022-06-19 NOTE — ED Notes (Signed)
Wrapped patient head with dressing and forehead.

## 2022-06-19 NOTE — ED Notes (Signed)
[facility] contacted for pt

## 2022-06-19 NOTE — Discharge Instructions (Addendum)
It was a pleasure taking care of you today. As discussed, there were no traumatic injuries on your scans. Follow-up with PCP within the next few days for recheck. Return to the ER for new or worsening symptoms.

## 2022-06-19 NOTE — ED Notes (Signed)
Attempted to call facility with no answer x2

## 2022-06-20 DIAGNOSIS — R296 Repeated falls: Secondary | ICD-10-CM | POA: Diagnosis not present

## 2022-06-20 DIAGNOSIS — Z7401 Bed confinement status: Secondary | ICD-10-CM | POA: Diagnosis not present

## 2022-06-20 DIAGNOSIS — Z9181 History of falling: Secondary | ICD-10-CM | POA: Diagnosis not present

## 2022-06-20 DIAGNOSIS — M6259 Muscle wasting and atrophy, not elsewhere classified, multiple sites: Secondary | ICD-10-CM | POA: Diagnosis not present

## 2022-06-20 DIAGNOSIS — R404 Transient alteration of awareness: Secondary | ICD-10-CM | POA: Diagnosis not present

## 2022-06-20 DIAGNOSIS — R1312 Dysphagia, oropharyngeal phase: Secondary | ICD-10-CM | POA: Diagnosis not present

## 2022-06-20 DIAGNOSIS — I2510 Atherosclerotic heart disease of native coronary artery without angina pectoris: Secondary | ICD-10-CM | POA: Diagnosis not present

## 2022-06-23 DIAGNOSIS — S61212A Laceration without foreign body of right middle finger without damage to nail, initial encounter: Secondary | ICD-10-CM | POA: Diagnosis not present

## 2022-06-23 DIAGNOSIS — E785 Hyperlipidemia, unspecified: Secondary | ICD-10-CM | POA: Diagnosis not present

## 2022-06-23 DIAGNOSIS — R4701 Aphasia: Secondary | ICD-10-CM | POA: Diagnosis not present

## 2022-06-23 DIAGNOSIS — S0181XA Laceration without foreign body of other part of head, initial encounter: Secondary | ICD-10-CM | POA: Diagnosis not present

## 2022-06-23 DIAGNOSIS — R1312 Dysphagia, oropharyngeal phase: Secondary | ICD-10-CM | POA: Diagnosis not present

## 2022-06-23 DIAGNOSIS — I10 Essential (primary) hypertension: Secondary | ICD-10-CM | POA: Diagnosis not present

## 2022-06-23 DIAGNOSIS — G2581 Restless legs syndrome: Secondary | ICD-10-CM | POA: Diagnosis not present

## 2022-06-23 DIAGNOSIS — R5381 Other malaise: Secondary | ICD-10-CM | POA: Diagnosis not present

## 2022-06-23 DIAGNOSIS — N400 Enlarged prostate without lower urinary tract symptoms: Secondary | ICD-10-CM | POA: Diagnosis not present

## 2022-06-23 DIAGNOSIS — N189 Chronic kidney disease, unspecified: Secondary | ICD-10-CM | POA: Diagnosis not present

## 2022-06-23 DIAGNOSIS — Z9181 History of falling: Secondary | ICD-10-CM | POA: Diagnosis not present

## 2022-06-23 DIAGNOSIS — I2510 Atherosclerotic heart disease of native coronary artery without angina pectoris: Secondary | ICD-10-CM | POA: Diagnosis not present

## 2022-06-23 DIAGNOSIS — M6259 Muscle wasting and atrophy, not elsewhere classified, multiple sites: Secondary | ICD-10-CM | POA: Diagnosis not present

## 2022-06-23 DIAGNOSIS — R296 Repeated falls: Secondary | ICD-10-CM | POA: Diagnosis not present

## 2022-06-23 DIAGNOSIS — K219 Gastro-esophageal reflux disease without esophagitis: Secondary | ICD-10-CM | POA: Diagnosis not present

## 2022-06-24 DIAGNOSIS — I2510 Atherosclerotic heart disease of native coronary artery without angina pectoris: Secondary | ICD-10-CM | POA: Diagnosis not present

## 2022-06-24 DIAGNOSIS — R1312 Dysphagia, oropharyngeal phase: Secondary | ICD-10-CM | POA: Diagnosis not present

## 2022-06-24 DIAGNOSIS — M6259 Muscle wasting and atrophy, not elsewhere classified, multiple sites: Secondary | ICD-10-CM | POA: Diagnosis not present

## 2022-06-24 DIAGNOSIS — Z9181 History of falling: Secondary | ICD-10-CM | POA: Diagnosis not present

## 2022-06-25 ENCOUNTER — Telehealth: Payer: Self-pay | Admitting: Hematology

## 2022-06-25 DIAGNOSIS — N189 Chronic kidney disease, unspecified: Secondary | ICD-10-CM | POA: Diagnosis not present

## 2022-06-25 DIAGNOSIS — Z9181 History of falling: Secondary | ICD-10-CM | POA: Diagnosis not present

## 2022-06-25 DIAGNOSIS — I2510 Atherosclerotic heart disease of native coronary artery without angina pectoris: Secondary | ICD-10-CM | POA: Diagnosis not present

## 2022-06-25 DIAGNOSIS — F0390 Unspecified dementia without behavioral disturbance: Secondary | ICD-10-CM | POA: Diagnosis not present

## 2022-06-25 DIAGNOSIS — D649 Anemia, unspecified: Secondary | ICD-10-CM | POA: Diagnosis not present

## 2022-06-25 DIAGNOSIS — R296 Repeated falls: Secondary | ICD-10-CM | POA: Diagnosis not present

## 2022-06-25 DIAGNOSIS — H04123 Dry eye syndrome of bilateral lacrimal glands: Secondary | ICD-10-CM | POA: Diagnosis not present

## 2022-06-25 DIAGNOSIS — S0181XA Laceration without foreign body of other part of head, initial encounter: Secondary | ICD-10-CM | POA: Diagnosis not present

## 2022-06-25 DIAGNOSIS — R4701 Aphasia: Secondary | ICD-10-CM | POA: Diagnosis not present

## 2022-06-25 DIAGNOSIS — G2581 Restless legs syndrome: Secondary | ICD-10-CM | POA: Diagnosis not present

## 2022-06-25 DIAGNOSIS — M6259 Muscle wasting and atrophy, not elsewhere classified, multiple sites: Secondary | ICD-10-CM | POA: Diagnosis not present

## 2022-06-25 DIAGNOSIS — R1312 Dysphagia, oropharyngeal phase: Secondary | ICD-10-CM | POA: Diagnosis not present

## 2022-06-25 DIAGNOSIS — R5381 Other malaise: Secondary | ICD-10-CM | POA: Diagnosis not present

## 2022-06-25 DIAGNOSIS — S61212A Laceration without foreign body of right middle finger without damage to nail, initial encounter: Secondary | ICD-10-CM | POA: Diagnosis not present

## 2022-06-25 DIAGNOSIS — I10 Essential (primary) hypertension: Secondary | ICD-10-CM | POA: Diagnosis not present

## 2022-06-26 ENCOUNTER — Telehealth: Payer: Self-pay | Admitting: Hematology

## 2022-06-26 DIAGNOSIS — I2510 Atherosclerotic heart disease of native coronary artery without angina pectoris: Secondary | ICD-10-CM | POA: Diagnosis not present

## 2022-06-26 DIAGNOSIS — I10 Essential (primary) hypertension: Secondary | ICD-10-CM | POA: Diagnosis not present

## 2022-06-26 DIAGNOSIS — M6259 Muscle wasting and atrophy, not elsewhere classified, multiple sites: Secondary | ICD-10-CM | POA: Diagnosis not present

## 2022-06-26 DIAGNOSIS — R1312 Dysphagia, oropharyngeal phase: Secondary | ICD-10-CM | POA: Diagnosis not present

## 2022-06-26 DIAGNOSIS — Z9181 History of falling: Secondary | ICD-10-CM | POA: Diagnosis not present

## 2022-06-26 DIAGNOSIS — N179 Acute kidney failure, unspecified: Secondary | ICD-10-CM | POA: Diagnosis not present

## 2022-06-26 NOTE — Telephone Encounter (Signed)
Returning call per voicemail, patient is aware of upcoming appointment time/dates.

## 2022-06-27 DIAGNOSIS — R1312 Dysphagia, oropharyngeal phase: Secondary | ICD-10-CM | POA: Diagnosis not present

## 2022-06-27 DIAGNOSIS — M6259 Muscle wasting and atrophy, not elsewhere classified, multiple sites: Secondary | ICD-10-CM | POA: Diagnosis not present

## 2022-06-27 DIAGNOSIS — Z9181 History of falling: Secondary | ICD-10-CM | POA: Diagnosis not present

## 2022-06-27 DIAGNOSIS — I2510 Atherosclerotic heart disease of native coronary artery without angina pectoris: Secondary | ICD-10-CM | POA: Diagnosis not present

## 2022-06-30 DIAGNOSIS — K219 Gastro-esophageal reflux disease without esophagitis: Secondary | ICD-10-CM | POA: Diagnosis not present

## 2022-06-30 DIAGNOSIS — M6259 Muscle wasting and atrophy, not elsewhere classified, multiple sites: Secondary | ICD-10-CM | POA: Diagnosis not present

## 2022-06-30 DIAGNOSIS — I2510 Atherosclerotic heart disease of native coronary artery without angina pectoris: Secondary | ICD-10-CM | POA: Diagnosis not present

## 2022-06-30 DIAGNOSIS — N400 Enlarged prostate without lower urinary tract symptoms: Secondary | ICD-10-CM | POA: Diagnosis not present

## 2022-06-30 DIAGNOSIS — Z9181 History of falling: Secondary | ICD-10-CM | POA: Diagnosis not present

## 2022-06-30 DIAGNOSIS — E559 Vitamin D deficiency, unspecified: Secondary | ICD-10-CM | POA: Diagnosis not present

## 2022-06-30 DIAGNOSIS — D649 Anemia, unspecified: Secondary | ICD-10-CM | POA: Diagnosis not present

## 2022-06-30 DIAGNOSIS — I480 Paroxysmal atrial fibrillation: Secondary | ICD-10-CM | POA: Diagnosis not present

## 2022-06-30 DIAGNOSIS — R1312 Dysphagia, oropharyngeal phase: Secondary | ICD-10-CM | POA: Diagnosis not present

## 2022-06-30 DIAGNOSIS — G2581 Restless legs syndrome: Secondary | ICD-10-CM | POA: Diagnosis not present

## 2022-06-30 DIAGNOSIS — N189 Chronic kidney disease, unspecified: Secondary | ICD-10-CM | POA: Diagnosis not present

## 2022-06-30 DIAGNOSIS — I10 Essential (primary) hypertension: Secondary | ICD-10-CM | POA: Diagnosis not present

## 2022-06-30 DIAGNOSIS — E785 Hyperlipidemia, unspecified: Secondary | ICD-10-CM | POA: Diagnosis not present

## 2022-06-30 DIAGNOSIS — R5381 Other malaise: Secondary | ICD-10-CM | POA: Diagnosis not present

## 2022-06-30 DIAGNOSIS — R296 Repeated falls: Secondary | ICD-10-CM | POA: Diagnosis not present

## 2022-06-30 DIAGNOSIS — H04123 Dry eye syndrome of bilateral lacrimal glands: Secondary | ICD-10-CM | POA: Diagnosis not present

## 2022-07-01 DIAGNOSIS — M6259 Muscle wasting and atrophy, not elsewhere classified, multiple sites: Secondary | ICD-10-CM | POA: Diagnosis not present

## 2022-07-01 DIAGNOSIS — Z9181 History of falling: Secondary | ICD-10-CM | POA: Diagnosis not present

## 2022-07-01 DIAGNOSIS — I2510 Atherosclerotic heart disease of native coronary artery without angina pectoris: Secondary | ICD-10-CM | POA: Diagnosis not present

## 2022-07-01 DIAGNOSIS — R1312 Dysphagia, oropharyngeal phase: Secondary | ICD-10-CM | POA: Diagnosis not present

## 2022-07-02 DIAGNOSIS — I2510 Atherosclerotic heart disease of native coronary artery without angina pectoris: Secondary | ICD-10-CM | POA: Diagnosis not present

## 2022-07-02 DIAGNOSIS — R1312 Dysphagia, oropharyngeal phase: Secondary | ICD-10-CM | POA: Diagnosis not present

## 2022-07-02 DIAGNOSIS — Z9181 History of falling: Secondary | ICD-10-CM | POA: Diagnosis not present

## 2022-07-02 DIAGNOSIS — M6259 Muscle wasting and atrophy, not elsewhere classified, multiple sites: Secondary | ICD-10-CM | POA: Diagnosis not present

## 2022-07-03 ENCOUNTER — Emergency Department (HOSPITAL_COMMUNITY): Payer: Medicare Other

## 2022-07-03 ENCOUNTER — Encounter (HOSPITAL_COMMUNITY): Payer: Self-pay

## 2022-07-03 ENCOUNTER — Observation Stay (HOSPITAL_COMMUNITY)
Admission: EM | Admit: 2022-07-03 | Discharge: 2022-07-04 | Disposition: A | Discharge status: Skilled Nursing Facility | Payer: Medicare Other | Attending: Internal Medicine | Admitting: Internal Medicine

## 2022-07-03 DIAGNOSIS — E871 Hypo-osmolality and hyponatremia: Secondary | ICD-10-CM

## 2022-07-03 DIAGNOSIS — R296 Repeated falls: Secondary | ICD-10-CM | POA: Diagnosis not present

## 2022-07-03 DIAGNOSIS — F419 Anxiety disorder, unspecified: Secondary | ICD-10-CM | POA: Insufficient documentation

## 2022-07-03 DIAGNOSIS — S0101XA Laceration without foreign body of scalp, initial encounter: Secondary | ICD-10-CM | POA: Diagnosis not present

## 2022-07-03 DIAGNOSIS — K219 Gastro-esophageal reflux disease without esophagitis: Secondary | ICD-10-CM | POA: Diagnosis present

## 2022-07-03 DIAGNOSIS — R1312 Dysphagia, oropharyngeal phase: Secondary | ICD-10-CM | POA: Diagnosis not present

## 2022-07-03 DIAGNOSIS — E785 Hyperlipidemia, unspecified: Secondary | ICD-10-CM | POA: Diagnosis present

## 2022-07-03 DIAGNOSIS — D638 Anemia in other chronic diseases classified elsewhere: Secondary | ICD-10-CM | POA: Insufficient documentation

## 2022-07-03 DIAGNOSIS — D649 Anemia, unspecified: Secondary | ICD-10-CM

## 2022-07-03 DIAGNOSIS — Z87891 Personal history of nicotine dependence: Secondary | ICD-10-CM | POA: Insufficient documentation

## 2022-07-03 DIAGNOSIS — Z7982 Long term (current) use of aspirin: Secondary | ICD-10-CM | POA: Diagnosis not present

## 2022-07-03 DIAGNOSIS — I10 Essential (primary) hypertension: Secondary | ICD-10-CM | POA: Diagnosis not present

## 2022-07-03 DIAGNOSIS — M6281 Muscle weakness (generalized): Secondary | ICD-10-CM | POA: Insufficient documentation

## 2022-07-03 DIAGNOSIS — F32A Depression, unspecified: Secondary | ICD-10-CM | POA: Insufficient documentation

## 2022-07-03 DIAGNOSIS — R2689 Other abnormalities of gait and mobility: Secondary | ICD-10-CM | POA: Insufficient documentation

## 2022-07-03 DIAGNOSIS — W19XXXA Unspecified fall, initial encounter: Secondary | ICD-10-CM | POA: Diagnosis not present

## 2022-07-03 DIAGNOSIS — M6259 Muscle wasting and atrophy, not elsewhere classified, multiple sites: Secondary | ICD-10-CM | POA: Diagnosis not present

## 2022-07-03 DIAGNOSIS — R9431 Abnormal electrocardiogram [ECG] [EKG]: Secondary | ICD-10-CM

## 2022-07-03 DIAGNOSIS — Z043 Encounter for examination and observation following other accident: Secondary | ICD-10-CM | POA: Diagnosis not present

## 2022-07-03 DIAGNOSIS — R4701 Aphasia: Secondary | ICD-10-CM | POA: Diagnosis not present

## 2022-07-03 DIAGNOSIS — N1831 Chronic kidney disease, stage 3a: Secondary | ICD-10-CM | POA: Insufficient documentation

## 2022-07-03 DIAGNOSIS — N400 Enlarged prostate without lower urinary tract symptoms: Secondary | ICD-10-CM | POA: Diagnosis present

## 2022-07-03 DIAGNOSIS — S0990XA Unspecified injury of head, initial encounter: Secondary | ICD-10-CM | POA: Diagnosis not present

## 2022-07-03 DIAGNOSIS — F0390 Unspecified dementia without behavioral disturbance: Secondary | ICD-10-CM | POA: Diagnosis not present

## 2022-07-03 DIAGNOSIS — I959 Hypotension, unspecified: Secondary | ICD-10-CM | POA: Diagnosis not present

## 2022-07-03 DIAGNOSIS — I129 Hypertensive chronic kidney disease with stage 1 through stage 4 chronic kidney disease, or unspecified chronic kidney disease: Secondary | ICD-10-CM | POA: Diagnosis not present

## 2022-07-03 DIAGNOSIS — I2510 Atherosclerotic heart disease of native coronary artery without angina pectoris: Secondary | ICD-10-CM | POA: Diagnosis not present

## 2022-07-03 DIAGNOSIS — S0003XA Contusion of scalp, initial encounter: Secondary | ICD-10-CM | POA: Diagnosis not present

## 2022-07-03 DIAGNOSIS — Z9181 History of falling: Secondary | ICD-10-CM | POA: Diagnosis not present

## 2022-07-03 LAB — TROPONIN I (HIGH SENSITIVITY)
Troponin I (High Sensitivity): 13 ng/L (ref <18)
Troponin I (High Sensitivity): 16 ng/L (ref <18)

## 2022-07-03 LAB — CBC WITH DIFFERENTIAL/PLATELET
Abs Immature Granulocytes: 0.02 10*3/uL (ref 0.00–0.07)
Basophils Absolute: 0 10*3/uL (ref 0.0–0.1)
Basophils Relative: 0 %
Eosinophils Absolute: 0 10*3/uL (ref 0.0–0.5)
Eosinophils Relative: 1 %
HCT: 24.2 % — ABNORMAL LOW (ref 39.0–52.0)
Hemoglobin: 7.5 g/dL — ABNORMAL LOW (ref 13.0–17.0)
Immature Granulocytes: 0 %
Lymphocytes Relative: 12 %
Lymphs Abs: 0.7 10*3/uL (ref 0.7–4.0)
MCH: 24.4 pg — ABNORMAL LOW (ref 26.0–34.0)
MCHC: 31 g/dL (ref 30.0–36.0)
MCV: 78.8 fL — ABNORMAL LOW (ref 80.0–100.0)
Monocytes Absolute: 0.4 10*3/uL (ref 0.1–1.0)
Monocytes Relative: 7 %
Neutro Abs: 4.7 10*3/uL (ref 1.7–7.7)
Neutrophils Relative %: 80 %
Platelets: 183 10*3/uL (ref 150–400)
RBC: 3.07 MIL/uL — ABNORMAL LOW (ref 4.22–5.81)
RDW: 17.2 % — ABNORMAL HIGH (ref 11.5–15.5)
WBC: 5.9 10*3/uL (ref 4.0–10.5)
nRBC: 0 % (ref 0.0–0.2)

## 2022-07-03 LAB — COMPREHENSIVE METABOLIC PANEL
ALT: 18 U/L (ref 0–44)
AST: 26 U/L (ref 15–41)
Albumin: 3 g/dL — ABNORMAL LOW (ref 3.5–5.0)
Alkaline Phosphatase: 86 U/L (ref 38–126)
Anion gap: 8 (ref 5–15)
BUN: 27 mg/dL — ABNORMAL HIGH (ref 8–23)
CO2: 23 mmol/L (ref 22–32)
Calcium: 8.2 mg/dL — ABNORMAL LOW (ref 8.9–10.3)
Chloride: 102 mmol/L (ref 98–111)
Creatinine, Ser: 1.29 mg/dL — ABNORMAL HIGH (ref 0.61–1.24)
GFR, Estimated: 54 mL/min — ABNORMAL LOW (ref >60)
Glucose, Bld: 120 mg/dL — ABNORMAL HIGH (ref 70–99)
Potassium: 4.1 mmol/L (ref 3.5–5.1)
Sodium: 133 mmol/L — ABNORMAL LOW (ref 135–145)
Total Bilirubin: 0.7 mg/dL (ref 0.3–1.2)
Total Protein: 5.1 g/dL — ABNORMAL LOW (ref 6.5–8.1)

## 2022-07-03 LAB — BRAIN NATRIURETIC PEPTIDE: B Natriuretic Peptide: 308.8 pg/mL — ABNORMAL HIGH (ref 0.0–100.0)

## 2022-07-03 MED ORDER — ALBUTEROL SULFATE HFA 108 (90 BASE) MCG/ACT IN AERS: 2.0000 | INHALATION_SPRAY | RESPIRATORY_TRACT | Status: DC | PRN

## 2022-07-03 MED ORDER — MAGNESIUM HYDROXIDE 400 MG/5ML PO SUSP: 30.0000 mL | Freq: Every day | ORAL | Status: DC | PRN

## 2022-07-03 MED ORDER — METOPROLOL SUCCINATE ER 25 MG PO TB24
25.0000 mg | ORAL_TABLET | Freq: Every morning | ORAL | Status: DC
  Administered 2022-07-04: 25 mg via ORAL
  Filled 2022-07-03: qty 1

## 2022-07-03 MED ORDER — VITAMIN B-12 100 MCG PO TABS
100.0000 ug | ORAL_TABLET | Freq: Every day | ORAL | Status: DC
  Administered 2022-07-04: 100 ug via ORAL
  Filled 2022-07-03 (×2): qty 1

## 2022-07-03 MED ORDER — ACETAMINOPHEN 325 MG PO TABS: 650.0000 mg | ORAL_TABLET | Freq: Four times a day (QID) | ORAL | Status: DC | PRN

## 2022-07-03 MED ORDER — TAMSULOSIN HCL 0.4 MG PO CAPS
0.4000 mg | ORAL_CAPSULE | Freq: Every evening | ORAL | Status: DC
  Administered 2022-07-03: 0.4 mg via ORAL
  Filled 2022-07-03: qty 1

## 2022-07-03 MED ORDER — ATORVASTATIN CALCIUM 10 MG PO TABS
20.0000 mg | ORAL_TABLET | Freq: Every evening | ORAL | Status: DC
  Administered 2022-07-03: 20 mg via ORAL
  Filled 2022-07-03: qty 1

## 2022-07-03 MED ORDER — VITAMIN C 500 MG PO TABS
500.0000 mg | ORAL_TABLET | Freq: Every morning | ORAL | Status: DC
  Administered 2022-07-04: 500 mg via ORAL
  Filled 2022-07-03: qty 1

## 2022-07-03 MED ORDER — VITAMIN D 25 MCG (1000 UNIT) PO TABS
1000.0000 [IU] | ORAL_TABLET | Freq: Every day | ORAL | Status: DC
  Administered 2022-07-04: 1000 [IU] via ORAL
  Filled 2022-07-03: qty 1

## 2022-07-03 MED ORDER — ONDANSETRON HCL 4 MG PO TABS: 4.0000 mg | ORAL_TABLET | Freq: Four times a day (QID) | ORAL | Status: DC | PRN

## 2022-07-03 MED ORDER — PANTOPRAZOLE SODIUM 40 MG PO TBEC
40.0000 mg | DELAYED_RELEASE_TABLET | Freq: Two times a day (BID) | ORAL | Status: DC
  Administered 2022-07-03 – 2022-07-04 (×2): 40 mg via ORAL
  Filled 2022-07-03 (×2): qty 1

## 2022-07-03 MED ORDER — TRAMADOL HCL 50 MG PO TABS
50.0000 mg | ORAL_TABLET | Freq: Four times a day (QID) | ORAL | Status: DC | PRN
  Administered 2022-07-04: 50 mg via ORAL
  Filled 2022-07-03: qty 1

## 2022-07-03 MED ORDER — POLYVINYL ALCOHOL 1.4 % OP SOLN: 1.0000 [drp] | Freq: Two times a day (BID) | OPHTHALMIC | Status: DC | PRN

## 2022-07-03 MED ORDER — ALBUTEROL SULFATE (2.5 MG/3ML) 0.083% IN NEBU: 2.5000 mg | INHALATION_SOLUTION | RESPIRATORY_TRACT | Status: DC | PRN

## 2022-07-03 MED ORDER — ASPIRIN 81 MG PO CHEW
81.0000 mg | CHEWABLE_TABLET | Freq: Every day | ORAL | Status: DC
  Administered 2022-07-03 – 2022-07-04 (×2): 81 mg via ORAL
  Filled 2022-07-03 (×2): qty 1

## 2022-07-03 MED ORDER — HYDRALAZINE HCL 25 MG PO TABS
25.0000 mg | ORAL_TABLET | Freq: Three times a day (TID) | ORAL | Status: DC
  Administered 2022-07-03 – 2022-07-04 (×2): 25 mg via ORAL
  Filled 2022-07-03 (×2): qty 1

## 2022-07-03 MED ORDER — ZINC SULFATE 220 (50 ZN) MG PO CAPS
220.0000 mg | ORAL_CAPSULE | Freq: Every day | ORAL | Status: DC
  Administered 2022-07-03 – 2022-07-04 (×2): 220 mg via ORAL
  Filled 2022-07-03 (×2): qty 1

## 2022-07-03 MED ORDER — ENOXAPARIN SODIUM 40 MG/0.4ML IJ SOSY
40.0000 mg | PREFILLED_SYRINGE | INTRAMUSCULAR | Status: DC
  Administered 2022-07-03: 40 mg via SUBCUTANEOUS
  Filled 2022-07-03: qty 0.4

## 2022-07-03 MED ORDER — SODIUM CHLORIDE 0.9 % IV SOLN: INTRAVENOUS | Status: DC

## 2022-07-03 MED ORDER — ONDANSETRON HCL 4 MG/2ML IJ SOLN: 4.0000 mg | Freq: Four times a day (QID) | INTRAMUSCULAR | Status: DC | PRN

## 2022-07-03 MED ORDER — ACETAMINOPHEN 650 MG RE SUPP: 650.0000 mg | Freq: Four times a day (QID) | RECTAL | Status: DC | PRN

## 2022-07-03 NOTE — H&P (Addendum)
[place]   PATIENT NAME: Brandon Fischer    MR#:  [id number]  DATE OF BIRTH:  [birth year]  DATE OF ADMISSION:  07/03/2022  PRIMARY CARE PHYSICIAN: Merri Brunette, MD   Patient is coming from: SNF  REQUESTING/REFERRING PHYSICIAN: Chase Caller, MD   CHIEF COMPLAINT:   Chief Complaint  Patient presents with   Fall    HISTORY OF PRESENT ILLNESS:  Brandon Fischer is a 89 y.o. Caucasian male with medical history significant for coronary artery disease, GERD, stage IIIa chronic kidney disease, hypertension, dyslipidemia, and PSVT, who presented to the emergency room with acute onset of unwitnessed fall at his SNF.  He had noted change in mental status with his baseline dementia.  The patient was nonverbal to me during my interview.  He was noted to have subsequent forehead laceration as well as lacerations on his extremities but later old from prior falls.  He had adequate hemostasis in his forehead.  No fever or chills.  No reported chest pain or cough or wheezing or nausea or vomiting or abdominal pain.  No other bleeding diathesis.  ED Course: When the patient came to the ER, BP was 162/71 with otherwise normal vital signs.  Labs revealed sodium of 133 and glucose 120, BUN 27 creatinine 1.29 with a calcium of 8.2.  Albumin was 3 and total protein 5.1.  BNP 308.8 high sensitive troponin I 13 and later 16.  CBC showed anemia with hemoglobin 7.5 hematocrit 24.2 compared to 8.5/26.5 with microcytosis. EKG as reviewed by me : EKG showed normal sinus rhythm with a rate of 83 with left atrial enlargement with T wave inversion anterolaterally and Q waves inferiorly Imaging: Noncontrasted head CT scan and showed no acute intracranial abnormality.  It showed soft tissue injury along the midline frontal scalp with no evidence of underlying calvarial fracture.  C-spine CT showed no acute cervical spine fracture.  It showed mild soft tissue stranding in the right supraclavicular region that  could be posttraumatic. Portable chest x-ray showed the following: Cardiomegaly. Increased interstitial markings are seen in right lower lung field which may suggest a crowding of bronchovascular structures due to poor inspiration or atelectasis/pneumonia. Pelvic x-ray showed no displaced fracture or dislocation.  The patient will be admitted to medical telemetry observation bed for further evaluation and management. PAST MEDICAL HISTORY:   Past Medical History:  Diagnosis Date   Bilateral carotid artery stenosis without cerebral infarction    ASYMPTOMATIC--  BILATERAL ICA  50-69%  PER CARDIOLOGIST NOTE, DR Jacinto Halim   Bilateral carotid bruits    LEFT  soft bruit, right no no carotid bruit per 07-24-2014 dr Jacinto Halim note   CKD (chronic kidney disease)    dr foster [place] kidney, ckd stage 3 per lov 02-16-2019   Complete heart block (HCC) 04/25/2021   Complication of anesthesia    1 episode atrial fib 02-09-2014 [facility] and followed up with cardiology, not further issue   Coronary artery disease    NON-OBSTRUCTIVE CAD  per cath 08-06-2004 HX PALPITATIONS-AND -TACHYCARDIA-   Encounter for care of pacemaker 04/26/2021   First degree heart block    Full dentures    GERD (gastroesophageal reflux disease)    Heart palpitations    Not any more   History of atrial fibrillation without current medication    EPISODE OF AFIB WITH RVR INTRAOPERATIVELY 02-09-2014 AT [facility]--  RESOLVED AND PT FOLLOWED UP WITH CARDIOLOGIST  DR Jacinto Halim   History of kidney stones  Hyperlipidemia    Hypertension    Memory loss    Nephrolithiasis    RIGHT   Organic impotence    Pacemaker: [employer] ASSURITY DR-RF - [id number] 04/26/2021   Psoriasis    Clearing up   PSVT (paroxysmal supraventricular tachycardia)    Right ureteral calculus    S/P radiofrequency ablation operation for arrhythmia-SVT 04/28/2014   Simple renal cyst    right   Wears glasses    Wears hearing aid    BILATERAL    PAST  SURGICAL HISTORY:   Past Surgical History:  Procedure Laterality Date   CARDIAC CATHETERIZATION  08-06-2004 dr Reyes Ivan   (abnormal stress test) Non-obstructive CAD, pLAD 20-30%,  LCX 30-40%, pRCA 20-30%, dRCA 40%, distal LM  mild diffuse calcifation 20-30% tapering stenosis,  preserved LVSF ef 55-60%   CARPAL TUNNEL RELEASE Bilateral 2005   CATARACT EXTRACTION, BILATERAL Bilateral 2011   CHOLECYSTECTOMY     CHOLECYSTECTOMY OPEN  1982   and NEPHROLITHOTOMY   COLONOSCOPY W/ POLYPECTOMY  last one 04-05-2008   CYSTOSCOPY WITH LITHOLAPAXY Right 04/14/2017   Procedure: CYSTOSCOPY WITH LITHOLAPAXY/RIGHT RETROGRADE PLYOGRAM/RIGHT URETEROSCOPY;  Surgeon: Bjorn Pippin, MD;  Location: [facility];  Service: Urology;  Laterality: Right;   CYSTOSCOPY WITH RETROGRADE PYELOGRAM, URETEROSCOPY AND STENT PLACEMENT Right 03/14/2014   Procedure: CYSTO/RIGHT RETROGRADE PYELOGRAM/RIGHT URETEROSCOPY/STONE EXTRACTION/STENT PLACEMENT;  Surgeon: Anner Crete, MD;  Location: [facility];  Service: Urology;  Laterality: Right;   CYSTOSCOPY WITH RETROGRADE PYELOGRAM, URETEROSCOPY AND STENT PLACEMENT Left 04/19/2019   Procedure: CYSTOSCOPY WITH LEFT  RETROGRADE LEFT , URETEROSCOPY WITH HOLMIUM LASER, BASKET EXTRACTION AND STENT PLACEMENT;  Surgeon: Bjorn Pippin, MD;  Location[facility];  Service: Urology;  Laterality: Left;   CYSTOSCOPY WITH RETROGRADE PYELOGRAM, URETEROSCOPY AND STENT PLACEMENT Left 03/30/2020   Procedure: CYSTOSCOPY WITH RETROGRADE PYELOGRAM, URETEROSCOPY, BASKETTING OF STONE AND STENT PLACEMENT;  Surgeon: Sebastian Ache, MD;  Location[facility];  Service: Urology;  Laterality: Left;   EYE SURGERY Bilateral    Cataracts removed   HOLMIUM LASER APPLICATION Right 03/14/2014   Procedure: RIGHT HOLMIUM LASER APPLICATION;  Surgeon: Anner Crete, MD;  Location: [facility];  Service: Urology;  Laterality: Right;   KNEE ARTHROSCOPY W/ DEBRIDEMENT Right  11/23/2002   and Synovectomy   LUMBAR LAMINECTOMY/DECOMPRESSION MICRODISCECTOMY N/A 07/15/2018   Procedure: Posterior lumbar decompression and fusion L4-5;  Surgeon: Venita Lick, MD;  Location: [facility];  Service: Orthopedics;  Laterality: N/A;  4 hrs   NEPHROLITHOTOMY  01/16/2011   Procedure: NEPHROLITHOTOMY PERCUTANEOUS;  Surgeon: Anner Crete;  Location: [facility];  Service: Urology;  Laterality: Left;  C-Arm  Holmium Laser   NEPHROLITHOTOMY  1981   PACEMAKER IMPLANT N/A 04/26/2021   Procedure: PACEMAKER IMPLANT;  Surgeon: Regan Lemming, MD;  Location: [facility];  Service: Cardiovascular;  Laterality: N/A;   SUPRAVENTRICULAR TACHYCARDIA ABLATION N/A 04/28/2014   Procedure: SUPRAVENTRICULAR TACHYCARDIA ABLATION;  Surgeon: Marinus Maw, MD;  [facility];  Service: Cardiovascular;  Laterality: N/A;    SOCIAL HISTORY:   Social History   Tobacco Use   Smoking status: Former    Packs/day: 1.00    Years: 40.00    Additional pack years: 0.00    Total pack years: 40.00    Types: Cigarettes    Quit date: 02/06/1998    Years since quitting: 24.4   Smokeless tobacco: Never  Substance Use Topics   Alcohol use: No  FAMILY HISTORY:   Family History  Problem Relation Age of Onset   Cancer Mother        Ovarian or colon   Heart attack Father    Heart attack Brother     DRUG ALLERGIES:   Allergies  Allergen Reactions   Bupropion Other (See Comments)    Disoriented and "allergic," per MAR   Trazodone Other (See Comments)    Unsteady gait and "allergic," per MAR   Olmesartan Rash    REVIEW OF SYSTEMS:   ROS As per history of present illness. All pertinent systems were reviewed above. Constitutional, HEENT, cardiovascular, respiratory, GI, GU, musculoskeletal, neuro, psychiatric, endocrine, integumentary and hematologic systems were reviewed and are otherwise negative/unremarkable except for positive findings mentioned above in the HPI.   MEDICATIONS  AT HOME:   Prior to Admission medications   Medication Sig Start Date End Date Taking? Authorizing Provider  albuterol (VENTOLIN HFA) 108 (90 Base) MCG/ACT inhaler Inhale 2 puffs into the lungs every 4 (four) hours as needed for wheezing or shortness of breath.    [provider]  ascorbic acid (VITAMIN C) 500 MG tablet Take 500 mg by mouth in the morning.    [provider]  aspirin (ASPIRIN CHILDRENS) 81 MG chewable tablet Chew 1 tablet (81 mg total) by mouth daily. 07/20/20   Yates Decamp, MD  atorvastatin (LIPITOR) 20 MG tablet Take 20 mg by mouth every evening.    [provider]  Cholecalciferol (VITAMIN D-3) 25 MCG (1000 UT) CAPS Take 1,000 Units by mouth daily with breakfast.    [provider]  hydrALAZINE (APRESOLINE) 10 MG tablet Take 1 tablet (10 mg total) by mouth 2 (two) times daily. Patient not taking: Reported on 04/23/2022 02/06/22 04/23/22  Hartley Barefoot A, MD  hydrALAZINE (APRESOLINE) 25 MG tablet Take 25 mg by mouth 3 (three) times daily.    [provider]  metoprolol succinate (TOPROL-XL) 25 MG 24 hr tablet TAKE 1 TABLET (25 MG TOTAL) BY MOUTH DAILY. Patient taking differently: Take 25 mg by mouth in the morning. 07/15/21 02/01/23  Yates Decamp, MD  Multiple Vitamins-Iron (MULTIVITAMIN/IRON PO) Take 1 tablet by mouth daily with breakfast.    [provider]  pantoprazole (PROTONIX) 40 MG tablet Take 40 mg by mouth in the morning and at bedtime.     [provider]  REFRESH TEARS 0.5 % SOLN Place 1 drop into both eyes in the morning and at bedtime.    [provider]  tamsulosin (FLOMAX) 0.4 MG CAPS capsule Take 0.4 mg by mouth every evening.    [provider]  traMADol (ULTRAM) 50 MG tablet Take 1 tablet (50 mg total) by mouth every 6 (six) hours as needed for moderate pain or severe pain. Post-operatively Patient taking differently: Take 50 mg by mouth every 6 (six) hours as needed (for pain). 02/06/22    Regalado, Belkys A, MD  vitamin B-12 100 MCG tablet Take 1 tablet (100 mcg total) by mouth daily. Patient not taking: Reported on 04/23/2022 02/07/22   Hartley Barefoot A, MD  zinc gluconate 50 MG tablet Take 50 mg by mouth daily.    [provider]      VITAL SIGNS:  Blood pressure 133/71, pulse 89, temperature 98.9 F (37.2 C), temperature source Oral, resp. rate 20, height 5\' 10"  (1.778 m), weight 81.6 kg, SpO2 99 %.  PHYSICAL EXAMINATION:  Physical Exam  GENERAL:  89 y.o.-year-old Caucasian male patient lying in the bed with  no acute distress.  EYES: Pupils equal, round, reactive to light and accommodation. No scleral icterus. Extraocular muscles intact.  HEENT: Head with frontal scalp laceration with adequate hemostasis, normocephalic. Oropharynx and nasopharynx clear.  NECK:  Supple, no jugular venous distention. No thyroid enlargement, no tenderness.  LUNGS: Normal breath sounds bilaterally, no wheezing, rales,rhonchi or crepitation. No use of accessory muscles of respiration.  CARDIOVASCULAR: Regular rate and rhythm, S1, S2 normal. No murmurs, rubs, or gallops.  ABDOMEN: Soft, nondistended, nontender. Bowel sounds present. No organomegaly or mass.  EXTREMITIES: No pedal edema, cyanosis, or clubbing.  NEUROLOGIC: Cranial nerves II through XII are intact. Muscle strength 5/5 in all extremities. Sensation intact. Gait not checked.  PSYCHIATRIC: The patient is alert and oriented x 3.  Normal affect and good eye contact. SKIN: No obvious rash, lesion, or ulcer other than forehead laceration above.   LABORATORY PANEL:   CBC Recent Labs  Lab 07/03/22 1721  WBC 5.9  HGB 7.5*  HCT 24.2*  PLT 183   ------------------------------------------------------------------------------------------------------------------  Chemistries  Recent Labs  Lab 07/03/22 1721  NA 133*  K 4.1  CL 102  CO2 23  GLUCOSE 120*  BUN 27*  CREATININE 1.29*  CALCIUM 8.2*  AST 26  ALT 18   ALKPHOS 86  BILITOT 0.7   ------------------------------------------------------------------------------------------------------------------  Cardiac Enzymes No results for input(s): "TROPONINI" in the last 168 hours. ------------------------------------------------------------------------------------------------------------------  RADIOLOGY:  CT Head Wo Contrast  Result Date: 07/03/2022 CLINICAL DATA:  fall EXAM: CT HEAD WITHOUT CONTRAST CT CERVICAL SPINE WITHOUT CONTRAST TECHNIQUE: Multidetector CT imaging of the head and cervical spine was performed following the standard protocol without intravenous contrast. Multiplanar CT image reconstructions of the cervical spine were also generated. RADIATION DOSE REDUCTION: This exam was performed according to the departmental dose-optimization program which includes automated exposure control, adjustment of the mA and/or kV according to patient size and/or use of iterative reconstruction technique. COMPARISON:  CT Head and C Spine 06/19/22 FINDINGS: CT HEAD FINDINGS Brain: No evidence of acute infarction, hemorrhage, extra-axial collection or mass lesion/mass effect. There is sequela of moderate chronic microvascular ischemic change with generalized volume loss. There is ventriculomegaly with an acute callosal angle, unchanged. Vascular: No hyperdense vessel or unexpected calcification. Skull: Soft tissue injury along the midline frontal scalp. No evidence of underlying calvarial fracture Sinuses/Orbits: No middle ear or mastoid effusion. Paranasal sinuses are notable for trace mucosal thickening in the left maxillary sinus. Bilateral lens replacement. Orbits are otherwise unremarkable. Other: None. CT CERVICAL SPINE FINDINGS Alignment: Straightening of the normal cervical lordosis. Grade 1 anterolisthesis of C3 on C4. Skull base and vertebrae: No acute fracture. No primary bone lesion or focal pathologic process. Soft tissues and spinal canal: No prevertebral  fluid or swelling. No visible canal hematoma. Mild soft tissue stranding in the right supraclavicular region, which could be posttraumatic. Disc levels:  No evidence of high-grade spinal stenosis. Upper chest: Negative. Other: None IMPRESSION: 1. No acute intracranial abnormality. 2. Soft tissue injury along the midline frontal scalp. No evidence of underlying calvarial fracture. 3. No acute cervical spine fracture. 4. Mild soft tissue stranding in the right supraclavicular region, which could be posttraumatic. Electronically Signed   By: Lorenza Cambridge M.D.   On: 07/03/2022 17:31   CT Cervical Spine Wo Contrast  Result Date: 07/03/2022 CLINICAL DATA:  fall EXAM: CT HEAD WITHOUT CONTRAST CT CERVICAL SPINE WITHOUT CONTRAST TECHNIQUE: Multidetector CT imaging of the head and cervical spine was performed following the standard protocol without intravenous contrast.  Multiplanar CT image reconstructions of the cervical spine were also generated. RADIATION DOSE REDUCTION: This exam was performed according to the departmental dose-optimization program which includes automated exposure control, adjustment of the mA and/or kV according to patient size and/or use of iterative reconstruction technique. COMPARISON:  CT Head and C Spine 06/19/22 FINDINGS: CT HEAD FINDINGS Brain: No evidence of acute infarction, hemorrhage, extra-axial collection or mass lesion/mass effect. There is sequela of moderate chronic microvascular ischemic change with generalized volume loss. There is ventriculomegaly with an acute callosal angle, unchanged. Vascular: No hyperdense vessel or unexpected calcification. Skull: Soft tissue injury along the midline frontal scalp. No evidence of underlying calvarial fracture Sinuses/Orbits: No middle ear or mastoid effusion. Paranasal sinuses are notable for trace mucosal thickening in the left maxillary sinus. Bilateral lens replacement. Orbits are otherwise unremarkable. Other: None. CT CERVICAL SPINE  FINDINGS Alignment: Straightening of the normal cervical lordosis. Grade 1 anterolisthesis of C3 on C4. Skull base and vertebrae: No acute fracture. No primary bone lesion or focal pathologic process. Soft tissues and spinal canal: No prevertebral fluid or swelling. No visible canal hematoma. Mild soft tissue stranding in the right supraclavicular region, which could be posttraumatic. Disc levels:  No evidence of high-grade spinal stenosis. Upper chest: Negative. Other: None IMPRESSION: 1. No acute intracranial abnormality. 2. Soft tissue injury along the midline frontal scalp. No evidence of underlying calvarial fracture. 3. No acute cervical spine fracture. 4. Mild soft tissue stranding in the right supraclavicular region, which could be posttraumatic. Electronically Signed   By: Lorenza Cambridge M.D.   On: 07/03/2022 17:31   DG Pelvis Portable  Result Date: 07/03/2022 CLINICAL DATA:  Trauma, fall EXAM: PORTABLE PELVIS 1-2 VIEWS COMPARISON:  06/19/2022 FINDINGS: No displaced fracture no dislocation is seen. Proximal right femur is not included in its entirety. There is surgical fusion at L4-L5 level. IMPRESSION: No displaced fracture or dislocation is seen. Electronically Signed   By: Ernie Avena M.D.   On: 07/03/2022 16:59   DG Chest Portable 1 View  Result Date: 07/03/2022 CLINICAL DATA:  Trauma, fall EXAM: PORTABLE CHEST 1 VIEW COMPARISON:  06/19/2022 FINDINGS: Transverse diameter of heart is increased. Increased markings are seen in right lower lung field. There is no focal consolidation. There is no pleural effusion or pneumothorax. Pacemaker battery is seen in the left infraclavicular region. IMPRESSION: Cardiomegaly. Increased interstitial markings are seen in right lower lung field which may suggest a crowding of bronchovascular structures due to poor inspiration or atelectasis/pneumonia. Electronically Signed   By: Ernie Avena M.D.   On: 07/03/2022 16:58      IMPRESSION AND PLAN:   Assessment and Plan: * Unwitnessed fall - It is unclear if the patient had syncope. - He has subsequent forehead scalp laceration. - The patient will be admitted to an observation medically monitored bed. - Will check orthostatics q 12 hours. - The patient will be gently hydrated with IV normal saline and monitored for arrhythmias. - Given abnormal EKG we will follow serial troponins. - Will apply bacitracin to his laceration.     Hyponatremia - The patient will be hydrated with IV normal saline and will follow sodium level.  Acute on chronic anemia - This could be related to minor blood loss from his Laceration Initially. - Will monitor H&H as he currently has good hemostasis.  Dyslipidemia - We will continue statin therapy.  BPH (benign prostatic hyperplasia) - We will continue Flomax.  GERD without esophagitis - We will continue PPI therapy.  Depression - We will continue Zoloft.  Anxiety - We will continue clonazepam.  Essential hypertension - We will continue his antihypertensives.    DVT prophylaxis: Lovenox.  Advanced Care Planning:  Code Status: full code.  Family Communication:  The plan of care was discussed in details with the patient (and family). I answered all questions. The patient agreed to proceed with the above mentioned plan. Further management will depend upon hospital course. Disposition Plan: Back to previous home environment Consults called: none.  All the records are reviewed and case discussed with ED provider.  Status is: Observation  I certify that at the time of admission, it is my clinical judgment that the patient will requiret hospital care extending less than 2 midnights.                            Dispo: The patient is from: Home              Anticipated d/c is to: Home              Patient currently is not medically stable to d/c.              Difficult to place patient: No  Hannah Beat M.D on 07/04/2022 at 1:59 AM  [facility]   From 7 PM-7 AM, contact night-coverage [email or web address]  CC: Primary care physician; Merri Brunette, MD

## 2022-07-03 NOTE — ED Notes (Signed)
Unable to give medications due to inpatient nurse already removed medications from pyxis upstairs.

## 2022-07-03 NOTE — ED Provider Notes (Signed)
[place] EMERGENCY DEPARTMENT AT [facility] Provider Note   HPI: Brandon Fischer is an 89 year old male with a past medical history as below presenting today after an unwitnessed fall.  The patient arrives with EMS.  They report the patient is at his mental status baseline according to his facility.  He has a history of dementia and is disoriented at baseline.  He is unable to provide additional history.  They report he has been hemodynamically stable in transit.  The patient was found down in his room at [facility].  They report a small laceration on the top of his head.  They report lacerations on his extremities are old from prior falls.  They report the only new skin lesion is the laceration on the top of his head which has been hemostatic in their transit.  Past Medical History:  Diagnosis Date   Bilateral carotid artery stenosis without cerebral infarction    ASYMPTOMATIC--  BILATERAL ICA  50-69%  PER CARDIOLOGIST NOTE, DR Jacinto Halim   Bilateral carotid bruits    LEFT  soft bruit, right no no carotid bruit per 07-24-2014 dr Jacinto Halim note   CKD (chronic kidney disease)    dr foster Robbie Lis kidney, ckd stage 3 per lov 02-16-2019   Complete heart block (HCC) 04/25/2021   Complication of anesthesia    1 episode atrial fib 02-09-2014 st [facility] and followed up with cardiology, not further issue   Coronary artery disease    NON-OBSTRUCTIVE CAD  per cath 08-06-2004 HX PALPITATIONS-AND -TACHYCARDIA-   Encounter for care of pacemaker 04/26/2021   First degree heart block    Full dentures    GERD (gastroesophageal reflux disease)    Heart palpitations    Not any more   History of atrial fibrillation without current medication    EPISODE OF AFIB WITH RVR INTRAOPERATIVELY 02-09-2014 AT [facility]--  RESOLVED AND PT FOLLOWED UP WITH CARDIOLOGIST  DR Jacinto Halim   History of kidney stones    Hyperlipidemia    Hypertension    Memory loss    Nephrolithiasis    RIGHT   Organic impotence     Pacemaker: [employer] ASSURITY DR-RF - [id number] 04/26/2021   Psoriasis    Clearing up   PSVT (paroxysmal supraventricular tachycardia)    Right ureteral calculus    S/P radiofrequency ablation operation for arrhythmia-SVT 04/28/2014   Simple renal cyst    right   Wears glasses    Wears hearing aid    BILATERAL    Past Surgical History:  Procedure Laterality Date   CARDIAC CATHETERIZATION  08-06-2004 dr Reyes Ivan   (abnormal stress test) Non-obstructive CAD, pLAD 20-30%,  LCX 30-40%, pRCA 20-30%, dRCA 40%, distal LM  mild diffuse calcifation 20-30% tapering stenosis,  preserved LVSF ef 55-60%   CARPAL TUNNEL RELEASE Bilateral 2005   CATARACT EXTRACTION, BILATERAL Bilateral 2011   CHOLECYSTECTOMY     CHOLECYSTECTOMY OPEN  1982   and NEPHROLITHOTOMY   COLONOSCOPY W/ POLYPECTOMY  last one 04-05-2008   CYSTOSCOPY WITH LITHOLAPAXY Right 04/14/2017   Procedure: CYSTOSCOPY WITH LITHOLAPAXY/RIGHT RETROGRADE PLYOGRAM/RIGHT URETEROSCOPY;  Surgeon: Bjorn Pippin, MD;  Location: [facility];  Service: Urology;  Laterality: Right;   CYSTOSCOPY WITH RETROGRADE PYELOGRAM, URETEROSCOPY AND STENT PLACEMENT Right 03/14/2014   Procedure: CYSTO/RIGHT RETROGRADE PYELOGRAM/RIGHT URETEROSCOPY/STONE EXTRACTION/STENT PLACEMENT;  Surgeon: Anner Crete, MD;  Location: [facility];  Service: Urology;  Laterality: Right;   CYSTOSCOPY WITH RETROGRADE PYELOGRAM, URETEROSCOPY AND STENT PLACEMENT Left 04/19/2019  Procedure: CYSTOSCOPY WITH LEFT  RETROGRADE LEFT , URETEROSCOPY WITH HOLMIUM LASER, BASKET EXTRACTION AND STENT PLACEMENT;  Surgeon: Bjorn Pippin, MD;  Location: [facility];  Service: Urology;  Laterality: Left;   CYSTOSCOPY WITH RETROGRADE PYELOGRAM, URETEROSCOPY AND STENT PLACEMENT Left 03/30/2020   Procedure: CYSTOSCOPY WITH RETROGRADE PYELOGRAM, URETEROSCOPY, BASKETTING OF STONE AND STENT PLACEMENT;  Surgeon: Sebastian Ache, MD;  Location: [facility];   Service: Urology;  Laterality: Left;   EYE SURGERY Bilateral    Cataracts removed   HOLMIUM LASER APPLICATION Right 03/14/2014   Procedure: RIGHT HOLMIUM LASER APPLICATION;  Surgeon: Anner Crete, MD;  Location: [facility];  Service: Urology;  Laterality: Right;   KNEE ARTHROSCOPY W/ DEBRIDEMENT Right 11/23/2002   and Synovectomy   LUMBAR LAMINECTOMY/DECOMPRESSION MICRODISCECTOMY N/A 07/15/2018   Procedure: Posterior lumbar decompression and fusion L4-5;  Surgeon: Venita Lick, MD;  Location: [facility] OR;  Service: Orthopedics;  Laterality: N/A;  4 hrs   NEPHROLITHOTOMY  01/16/2011   Procedure: NEPHROLITHOTOMY PERCUTANEOUS;  Surgeon: Anner Crete;  Location: [facility];  Service: Urology;  Laterality: Left;  C-Arm  Holmium Laser   NEPHROLITHOTOMY  1981   PACEMAKER IMPLANT N/A 04/26/2021   Procedure: PACEMAKER IMPLANT;  Surgeon: Regan Lemming, MD;  Location: [facility];  Service: Cardiovascular;  Laterality: N/A;   SUPRAVENTRICULAR TACHYCARDIA ABLATION N/A 04/28/2014   Procedure: SUPRAVENTRICULAR TACHYCARDIA ABLATION;  Surgeon: Marinus Maw, MD;  Location: [facility] C[facility];  Service: Cardiovascular;  Laterality: N/A;     Social History   Tobacco Use   Smoking status: Former    Packs/day: 1.00    Years: 40.00    Additional pack years: 0.00    Total pack years: 40.00    Types: Cigarettes    Quit date: 02/06/1998    Years since quitting: 24.4   Smokeless tobacco: Never  Vaping Use   Vaping Use: Never used  Substance Use Topics   Alcohol use: No   Drug use: No      Review of Systems  A complete ROS was performed with pertinent positives/negatives noted in the HPI.   Vitals:   07/03/22 1624 07/03/22 1630  BP:  (!) 162/71  Pulse: 79 85  Resp: 14 13  Temp:  98 F (36.7 C)  SpO2: 100% 100%    Physical Exam Vitals and nursing note reviewed. Exam conducted with a chaperone present.  Constitutional:      General: He is not in acute distress.     Appearance: He is well-developed. He is not ill-appearing.  Neck:     Comments: C-collar in place Cardiovascular:     Rate and Rhythm: Normal rate and regular rhythm.     Pulses: Normal pulses.     Heart sounds: Normal heart sounds. No murmur heard.    No friction rub. No gallop.  Pulmonary:     Effort: Pulmonary effort is normal. No respiratory distress.     Breath sounds: Normal breath sounds. No stridor. No wheezing, rhonchi or rales.  Abdominal:     General: Abdomen is flat. There is no distension.     Palpations: Abdomen is soft.     Tenderness: There is no abdominal tenderness. There is no guarding or rebound.  Genitourinary:    Comments: Rectal exam with no gross blood Musculoskeletal:        General: No swelling.     Comments: No midline C/T/L-spine obvious midline tenderness, no step-offs or deformities down the spine.  No obvious tenderness to palpation to the anterior lateral chest wall.  No clavicle tenderness to palpation.  No hip or lower extremity tenderness to palpation.  No upper extremity bony tenderness to palpation.  Skin:    General: Skin is warm and dry.     Capillary Refill: Capillary refill takes less than 2 seconds.     Comments: Small abrasion/skin tear to the top of the head.  Hemostatic.  Neurological:     Mental Status: He is alert.     Procedures  MDM:  Imaging/radiology results:  CT Head Wo Contrast  Result Date: 07/03/2022 CLINICAL DATA:  fall EXAM: CT HEAD WITHOUT CONTRAST CT CERVICAL SPINE WITHOUT CONTRAST TECHNIQUE: Multidetector CT imaging of the head and cervical spine was performed following the standard protocol without intravenous contrast. Multiplanar CT image reconstructions of the cervical spine were also generated. RADIATION DOSE REDUCTION: This exam was performed according to the departmental dose-optimization program which includes automated exposure control, adjustment of the mA and/or kV according to patient size and/or use of  iterative reconstruction technique. COMPARISON:  CT Head and C Spine 06/19/22 FINDINGS: CT HEAD FINDINGS Brain: No evidence of acute infarction, hemorrhage, extra-axial collection or mass lesion/mass effect. There is sequela of moderate chronic microvascular ischemic change with generalized volume loss. There is ventriculomegaly with an acute callosal angle, unchanged. Vascular: No hyperdense vessel or unexpected calcification. Skull: Soft tissue injury along the midline frontal scalp. No evidence of underlying calvarial fracture Sinuses/Orbits: No middle ear or mastoid effusion. Paranasal sinuses are notable for trace mucosal thickening in the left maxillary sinus. Bilateral lens replacement. Orbits are otherwise unremarkable. Other: None. CT CERVICAL SPINE FINDINGS Alignment: Straightening of the normal cervical lordosis. Grade 1 anterolisthesis of C3 on C4. Skull base and vertebrae: No acute fracture. No primary bone lesion or focal pathologic process. Soft tissues and spinal canal: No prevertebral fluid or swelling. No visible canal hematoma. Mild soft tissue stranding in the right supraclavicular region, which could be posttraumatic. Disc levels:  No evidence of high-grade spinal stenosis. Upper chest: Negative. Other: None IMPRESSION: 1. No acute intracranial abnormality. 2. Soft tissue injury along the midline frontal scalp. No evidence of underlying calvarial fracture. 3. No acute cervical spine fracture. 4. Mild soft tissue stranding in the right supraclavicular region, which could be posttraumatic. Electronically Signed   By: Lorenza Cambridge M.D.   On: 07/03/2022 17:31   CT Cervical Spine Wo Contrast  Result Date: 07/03/2022 CLINICAL DATA:  fall EXAM: CT HEAD WITHOUT CONTRAST CT CERVICAL SPINE WITHOUT CONTRAST TECHNIQUE: Multidetector CT imaging of the head and cervical spine was performed following the standard protocol without intravenous contrast. Multiplanar CT image reconstructions of the cervical  spine were also generated. RADIATION DOSE REDUCTION: This exam was performed according to the departmental dose-optimization program which includes automated exposure control, adjustment of the mA and/or kV according to patient size and/or use of iterative reconstruction technique. COMPARISON:  CT Head and C Spine 06/19/22 FINDINGS: CT HEAD FINDINGS Brain: No evidence of acute infarction, hemorrhage, extra-axial collection or mass lesion/mass effect. There is sequela of moderate chronic microvascular ischemic change with generalized volume loss. There is ventriculomegaly with an acute callosal angle, unchanged. Vascular: No hyperdense vessel or unexpected calcification. Skull: Soft tissue injury along the midline frontal scalp. No evidence of underlying calvarial fracture Sinuses/Orbits: No middle ear or mastoid effusion. Paranasal sinuses are notable for trace mucosal thickening in the left maxillary sinus. Bilateral lens replacement. Orbits are otherwise unremarkable. Other: None. CT CERVICAL  SPINE FINDINGS Alignment: Straightening of the normal cervical lordosis. Grade 1 anterolisthesis of C3 on C4. Skull base and vertebrae: No acute fracture. No primary bone lesion or focal pathologic process. Soft tissues and spinal canal: No prevertebral fluid or swelling. No visible canal hematoma. Mild soft tissue stranding in the right supraclavicular region, which could be posttraumatic. Disc levels:  No evidence of high-grade spinal stenosis. Upper chest: Negative. Other: None IMPRESSION: 1. No acute intracranial abnormality. 2. Soft tissue injury along the midline frontal scalp. No evidence of underlying calvarial fracture. 3. No acute cervical spine fracture. 4. Mild soft tissue stranding in the right supraclavicular region, which could be posttraumatic. Electronically Signed   By: Lorenza Cambridge M.D.   On: 07/03/2022 17:31   DG Pelvis Portable  Result Date: 07/03/2022 CLINICAL DATA:  Trauma, fall EXAM: PORTABLE PELVIS  1-2 VIEWS COMPARISON:  06/19/2022 FINDINGS: No displaced fracture no dislocation is seen. Proximal right femur is not included in its entirety. There is surgical fusion at L4-L5 level. IMPRESSION: No displaced fracture or dislocation is seen. Electronically Signed   By: Ernie Avena M.D.   On: 07/03/2022 16:59   DG Chest Portable 1 View  Result Date: 07/03/2022 CLINICAL DATA:  Trauma, fall EXAM: PORTABLE CHEST 1 VIEW COMPARISON:  06/19/2022 FINDINGS: Transverse diameter of heart is increased. Increased markings are seen in right lower lung field. There is no focal consolidation. There is no pleural effusion or pneumothorax. Pacemaker battery is seen in the left infraclavicular region. IMPRESSION: Cardiomegaly. Increased interstitial markings are seen in right lower lung field which may suggest a crowding of bronchovascular structures due to poor inspiration or atelectasis/pneumonia. Electronically Signed   By: Ernie Avena M.D.   On: 07/03/2022 16:58      EKG results: ECG on my interpretation is normal sinus rhythm and rate, without anatomical ischemia representing STEMI, New-onset Arrhythmia, or ischemic equivalent.  Patient does have anterolateral T wave inversions which appear to be new.  Lab results:  Results for orders placed or performed during the hospital encounter of 07/03/22 (from the past 24 hour(s))  CBC with Differential/Platelet     Status: Abnormal   Collection Time: 07/03/22  5:21 PM  Result Value Ref Range   WBC 5.9 4.0 - 10.5 K/uL   RBC 3.07 (L) 4.22 - 5.81 MIL/uL   Hemoglobin 7.5 (L) 13.0 - 17.0 g/dL   HCT 19.1 (L) 47.8 - 29.5 %   MCV 78.8 (L) 80.0 - 100.0 fL   MCH 24.4 (L) 26.0 - 34.0 pg   MCHC 31.0 30.0 - 36.0 g/dL   RDW 62.1 (H) 30.8 - 65.7 %   Platelets 183 150 - 400 K/uL   nRBC 0.0 0.0 - 0.2 %   Neutrophils Relative % 80 %   Neutro Abs 4.7 1.7 - 7.7 K/uL   Lymphocytes Relative 12 %   Lymphs Abs 0.7 0.7 - 4.0 K/uL   Monocytes Relative 7 %   Monocytes  Absolute 0.4 0.1 - 1.0 K/uL   Eosinophils Relative 1 %   Eosinophils Absolute 0.0 0.0 - 0.5 K/uL   Basophils Relative 0 %   Basophils Absolute 0.0 0.0 - 0.1 K/uL   Immature Granulocytes 0 %   Abs Immature Granulocytes 0.02 0.00 - 0.07 K/uL  Comprehensive metabolic panel     Status: Abnormal   Collection Time: 07/03/22  5:21 PM  Result Value Ref Range   Sodium 133 (L) 135 - 145 mmol/L   Potassium 4.1 3.5 - 5.1 mmol/L  Chloride 102 98 - 111 mmol/L   CO2 23 22 - 32 mmol/L   Glucose, Bld 120 (H) 70 - 99 mg/dL   BUN 27 (H) 8 - 23 mg/dL   Creatinine, Ser 4.54 (H) 0.61 - 1.24 mg/dL   Calcium 8.2 (L) 8.9 - 10.3 mg/dL   Total Protein 5.1 (L) 6.5 - 8.1 g/dL   Albumin 3.0 (L) 3.5 - 5.0 g/dL   AST 26 15 - 41 U/L   ALT 18 0 - 44 U/L   Alkaline Phosphatase 86 38 - 126 U/L   Total Bilirubin 0.7 0.3 - 1.2 mg/dL   GFR, Estimated 54 (L) >60 mL/min   Anion gap 8 5 - 15  Troponin I (High Sensitivity)     Status: None   Collection Time: 07/03/22  5:21 PM  Result Value Ref Range   Troponin I (High Sensitivity) 13 <18 ng/L  Brain natriuretic peptide     Status: Abnormal   Collection Time: 07/03/22  5:21 PM  Result Value Ref Range   B Natriuretic Peptide 308.8 (H) 0.0 - 100.0 pg/mL    Key medications administered in the ER:  Medications  enoxaparin (LOVENOX) injection 40 mg (has no administration in time range)  0.9 %  sodium chloride infusion (has no administration in time range)  acetaminophen (TYLENOL) tablet 650 mg (has no administration in time range)    Or  acetaminophen (TYLENOL) suppository 650 mg (has no administration in time range)  magnesium hydroxide (MILK OF MAGNESIA) suspension 30 mL (has no administration in time range)  ondansetron (ZOFRAN) tablet 4 mg (has no administration in time range)    Or  ondansetron (ZOFRAN) injection 4 mg (has no administration in time range)  aspirin chewable tablet 81 mg (has no administration in time range)  traMADol (ULTRAM) tablet 50 mg (has  no administration in time range)  atorvastatin (LIPITOR) tablet 20 mg (has no administration in time range)  hydrALAZINE (APRESOLINE) tablet 25 mg (has no administration in time range)  metoprolol succinate (TOPROL-XL) 24 hr tablet 25 mg (has no administration in time range)  pantoprazole (PROTONIX) EC tablet 40 mg (has no administration in time range)  tamsulosin (FLOMAX) capsule 0.4 mg (has no administration in time range)  vitamin B-12 (CYANOCOBALAMIN) tablet 100 mcg (has no administration in time range)  ascorbic acid (VITAMIN C) tablet 500 mg (has no administration in time range)  Vitamin D-3 CAPS 1,000 Units (has no administration in time range)  zinc gluconate tablet 50 mg (has no administration in time range)  carboxymethylcellulose (REFRESH PLUS) 0.5 % ophthalmic solution 1 drop (has no administration in time range)  albuterol (PROVENTIL) (2.5 MG/3ML) 0.083% nebulizer solution 2.5 mg (has no administration in time range)   Medical decision making: -Vital signs stable. Patient afebrile, hemodynamically stable, and non-toxic appearing. -Patient's presentation is most consistent with acute presentation with potential threat to life or bodily function.. Brandon Fischer is a 89 y.o. male presenting to the emergency department with an unwitnessed fall.  -Additional history obtained from EMS as above. -Per chart review, patient is not documented to be on any blood thinning medications.  EKG compared to priors as above. -On arrival, patient is an intact airway and bilateral breath sounds.  He is hemodynamically stable.  Will proceed with trauma and medical workup as this was unwitnessed.  Trauma workup including CT head, CT C-spine, chest x-ray, and pelvic x-rays unremarkable for acute traumatic abnormality.  Patient has a small abrasion/skin tear to the top  of the head however does not have anything requiring repair.  Medical workup pursued as this could have represented a syncopal episode.   Patient is nonverbal and unable to provide additional details.  EKG shows new ST depressions.  BNP is also elevated to 300.  CBC shows hemoglobin of 7.5 down from 1 month ago at 8.5.  Given concerns that this could represent a syncopal episode from possible GI bleed versus cardiogenic syncope, believe the patient is high risk to go home and will proceed with admission.  I have discussed this with family who are in agreement with this plan.  Have discussed patient with medicine who will admit for further management.    Medical Decision Making Amount and/or Complexity of Data Reviewed Independent Historian: EMS External Data Reviewed: ECG and notes. Labs: ordered. Radiology: ordered.  Risk Decision regarding hospitalization.     The plan for this patient was discussed with Dr. Elpidio Anis, who voiced agreement and who oversaw evaluation and treatment of this patient.  Marta Lamas, MD Emergency Medicine, PGY-3  Note: Dragon medical dictation software was used in the creation of this note.   Clinical Impression:  1. Fall, initial encounter          Chase Caller, MD 07/03/22 2131    Mardene Sayer, MD 07/04/22 0157

## 2022-07-03 NOTE — ED Triage Notes (Addendum)
Usually walks with a walker. From [employer]. Unwitnessed fall. Found on the floor by staff at 1410. Hx of dementia. At baseline mentation per staff. Laceration to top of head. Not on blood thinners. No LOC. Unable to answer questions which is pt baseline.   EMS VS: HR 82 BP 141/57 100% on RA RR 18

## 2022-07-03 NOTE — ED Notes (Signed)
ED TO INPATIENT HANDOFF REPORT  ED Nurse Name and Phone #: Juliette Alcide RN #[phone]  S Name/Age/Gender Brandon Fischer 89 y.o. male Room/Bed: 021C/021C  Code Status   Code Status: DNR  Home/SNF/Other Skilled nursing facility Patient oriented to: self Is this baseline? Yes   Triage Complete: Triage complete  Chief Complaint Unwitnessed fall [R29.6]  Triage Note Usually walks with a walker. From [employer]. Unwitnessed fall. Found on the floor by staff at 1410. Hx of dementia. At baseline mentation per staff. Laceration to top of head. Not on blood thinners. No LOC. Unable to answer questions which is pt baseline.   EMS VS: HR 82 BP 141/57 100% on RA RR 18   Allergies Allergies  Allergen Reactions   Bupropion Other (See Comments)    Disoriented and "allergic," per MAR   Trazodone Other (See Comments)    Unsteady gait and "allergic," per MAR   Olmesartan Rash    Level of Care/Admitting Diagnosis ED Disposition     ED Disposition  Admit   Condition  --   Comment  Hospital Area: [facility] [[id number]]  Level of Care: Telemetry Medical [104]  May place patient in observation at [facility] or [facility] if equivalent level of care is available:: No  Covid Evaluation: Asymptomatic - no recent exposure (last 10 days) testing not required  Diagnosis: Unwitnessed fall [[id number]]  Admitting Physician: Hannah Beat [[id number]]  Attending Physician: Hannah Beat [[id number]]          B Medical/Surgery History Past Medical History:  Diagnosis Date   Bilateral carotid artery stenosis without cerebral infarction    ASYMPTOMATIC--  BILATERAL ICA  50-69%  PER CARDIOLOGIST NOTE, DR Jacinto Halim   Bilateral carotid bruits    LEFT  soft bruit, right no no carotid bruit per 07-24-2014 dr Jacinto Halim note   CKD (chronic kidney disease)    dr foster [place] kidney, ckd stage 3 per lov 02-16-2019   Complete heart block (HCC) 04/25/2021   Complication of anesthesia     1 episode atrial fib 02-09-2014 [facility] and followed up with cardiology, not further issue   Coronary artery disease    NON-OBSTRUCTIVE CAD  per cath 08-06-2004 HX PALPITATIONS-AND -TACHYCARDIA-   Encounter for care of pacemaker 04/26/2021   First degree heart block    Full dentures    GERD (gastroesophageal reflux disease)    Heart palpitations    Not any more   History of atrial fibrillation without current medication    EPISODE OF AFIB WITH RVR INTRAOPERATIVELY 02-09-2014 AT [facility]--  RESOLVED AND PT FOLLOWED UP WITH CARDIOLOGIST  DR Jacinto Halim   History of kidney stones    Hyperlipidemia    Hypertension    Memory loss    Nephrolithiasis    RIGHT   Organic impotence    Pacemaker: [employer] ASSURITY DR-RF - [id number] 04/26/2021   Psoriasis    Clearing up   PSVT (paroxysmal supraventricular tachycardia)    Right ureteral calculus    S/P radiofrequency ablation operation for arrhythmia-SVT 04/28/2014   Simple renal cyst    right   Wears glasses    Wears hearing aid    BILATERAL   Past Surgical History:  Procedure Laterality Date   CARDIAC CATHETERIZATION  08-06-2004 dr Reyes Ivan   (abnormal stress test) Non-obstructive CAD, pLAD 20-30%,  LCX 30-40%, pRCA 20-30%, dRCA 40%, distal LM  mild diffuse calcifation 20-30% tapering stenosis,  preserved LVSF ef 55-60%  CARPAL TUNNEL RELEASE Bilateral 2005   CATARACT EXTRACTION, BILATERAL Bilateral 2011   CHOLECYSTECTOMY     CHOLECYSTECTOMY OPEN  1982   and NEPHROLITHOTOMY   COLONOSCOPY W/ POLYPECTOMY  last one 04-05-2008   CYSTOSCOPY WITH LITHOLAPAXY Right 04/14/2017   Procedure: CYSTOSCOPY WITH LITHOLAPAXY/RIGHT RETROGRADE PLYOGRAM/RIGHT URETEROSCOPY;  Surgeon: Bjorn Pippin, MD;  Location: [facility];  Service: Urology;  Laterality: Right;   CYSTOSCOPY WITH RETROGRADE PYELOGRAM, URETEROSCOPY AND STENT PLACEMENT Right 03/14/2014   Procedure: CYSTO/RIGHT RETROGRADE PYELOGRAM/RIGHT URETEROSCOPY/STONE EXTRACTION/STENT PLACEMENT;   Surgeon: Anner Crete, MD;  Location[facility];  Service: Urology;  Laterality: Right;   CYSTOSCOPY WITH RETROGRADE PYELOGRAM, URETEROSCOPY AND STENT PLACEMENT Left 04/19/2019   Procedure: CYSTOSCOPY WITH LEFT  RETROGRADE LEFT , URETEROSCOPY WITH HOLMIUM LASER, BASKET EXTRACTION AND STENT PLACEMENT;  Surgeon: Bjorn Pippin, MD;  Location: [facility];  Service: Urology;  Laterality: Left;   CYSTOSCOPY WITH RETROGRADE PYELOGRAM, URETEROSCOPY AND STENT PLACEMENT Left 03/30/2020   Procedure: CYSTOSCOPY WITH RETROGRADE PYELOGRAM, URETEROSCOPY, BASKETTING OF STONE AND STENT PLACEMENT;  Surgeon: Sebastian Ache, MD;  Location: [facility];  Service: Urology;  Laterality: Left;   EYE SURGERY Bilateral    Cataracts removed   HOLMIUM LASER APPLICATION Right 03/14/2014   Procedure: RIGHT HOLMIUM LASER APPLICATION;  Surgeon: Anner Crete, MD;  Location: [facility];  Service: Urology;  Laterality: Right;   KNEE ARTHROSCOPY W/ DEBRIDEMENT Right 11/23/2002   and Synovectomy   LUMBAR LAMINECTOMY/DECOMPRESSION MICRODISCECTOMY N/A 07/15/2018   Procedure: Posterior lumbar decompression and fusion L4-5;  Surgeon: Venita Lick, MD;  Location: [facility] OR;  Service: Orthopedics;  Laterality: N/A;  4 hrs   NEPHROLITHOTOMY  01/16/2011   Procedure: NEPHROLITHOTOMY PERCUTANEOUS;  Surgeon: Anner Crete;  Location: [facility];  Service: Urology;  Laterality: Left;  C-Arm  Holmium Laser   NEPHROLITHOTOMY  1981   PACEMAKER IMPLANT N/A 04/26/2021   Procedure: PACEMAKER IMPLANT;  Surgeon: Regan Lemming, MD;  Location: [facility];  Service: Cardiovascular;  Laterality: N/A;   SUPRAVENTRICULAR TACHYCARDIA ABLATION N/A 04/28/2014   Procedure: SUPRAVENTRICULAR TACHYCARDIA ABLATION;  Surgeon: Marinus Maw, MD;  Location: [facility] CATH [facility];  Service: Cardiovascular;  Laterality: N/A;     A IV Location/Drains/Wounds Patient Lines/Drains/Airways Status     Active  Line/Drains/Airways     None            Intake/Output Last 24 hours No intake or output data in the 24 hours ending 07/03/22 2222  Labs/Imaging Results for orders placed or performed during the hospital encounter of 07/03/22 (from the past 48 hour(s))  CBC with Differential/Platelet     Status: Abnormal   Collection Time: 07/03/22  5:21 PM  Result Value Ref Range   WBC 5.9 4.0 - 10.5 K/uL   RBC 3.07 (L) 4.22 - 5.81 MIL/uL   Hemoglobin 7.5 (L) 13.0 - 17.0 g/dL   HCT 09.8 (L) 11.9 - 14.7 %   MCV 78.8 (L) 80.0 - 100.0 fL   MCH 24.4 (L) 26.0 - 34.0 pg   MCHC 31.0 30.0 - 36.0 g/dL   RDW 82.9 (H) 56.2 - 13.0 %   Platelets 183 150 - 400 K/uL   nRBC 0.0 0.0 - 0.2 %   Neutrophils Relative % 80 %   Neutro Abs 4.7 1.7 - 7.7 K/uL   Lymphocytes Relative 12 %   Lymphs Abs 0.7 0.7 - 4.0 K/uL   Monocytes Relative 7 %   Monocytes Absolute 0.4 0.1 -  1.0 K/uL   Eosinophils Relative 1 %   Eosinophils Absolute 0.0 0.0 - 0.5 K/uL   Basophils Relative 0 %   Basophils Absolute 0.0 0.0 - 0.1 K/uL   Immature Granulocytes 0 %   Abs Immature Granulocytes 0.02 0.00 - 0.07 K/uL    Comment: Performed at [facility], [address] [place], [place] [address]  Comprehensive metabolic panel     Status: Abnormal   Collection Time: 07/03/22  5:21 PM  Result Value Ref Range   Sodium 133 (L) 135 - 145 mmol/L   Potassium 4.1 3.5 - 5.1 mmol/L   Chloride 102 98 - 111 mmol/L   CO2 23 22 - 32 mmol/L   Glucose, Bld 120 (H) 70 - 99 mg/dL    Comment: Glucose reference range applies only to samples taken after fasting for at least 8 hours.   BUN 27 (H) 8 - 23 mg/dL   Creatinine, Ser 6.04 (H) 0.61 - 1.24 mg/dL   Calcium 8.2 (L) 8.9 - 10.3 mg/dL   Total Protein 5.1 (L) 6.5 - 8.1 g/dL   Albumin 3.0 (L) 3.5 - 5.0 g/dL   AST 26 15 - 41 U/L   ALT 18 0 - 44 U/L   Alkaline Phosphatase 86 38 - 126 U/L   Total Bilirubin 0.7 0.3 - 1.2 mg/dL   GFR, Estimated 54 (L) >60 mL/min    Comment: (NOTE) Calculated  using the CKD-EPI Creatinine Equation (2021)    Anion gap 8 5 - 15    Comment: Performed at [facility], [address], [place], [place] [address]  Troponin I (High Sensitivity)     Status: None   Collection Time: 07/03/22  5:21 PM  Result Value Ref Range   Troponin I (High Sensitivity) 13 <18 ng/L    Comment: (NOTE) Elevated high sensitivity troponin I (hsTnI) values and significant  changes across serial measurements may suggest ACS but many other  chronic and acute conditions are known to elevate hsTnI results.  Refer to the "Links" section for chest pain algorithms and additional  guidance. Performed at [facility], [address], [place], [place] [address]   Brain natriuretic peptide     Status: Abnormal   Collection Time: 07/03/22  5:21 PM  Result Value Ref Range   B Natriuretic Peptide 308.8 (H) 0.0 - 100.0 pg/mL    Comment: Performed at [facility], [address], [place], [place] [address]   CT Head Wo Contrast  Result Date: 07/03/2022 CLINICAL DATA:  fall EXAM: CT HEAD WITHOUT CONTRAST CT CERVICAL SPINE WITHOUT CONTRAST TECHNIQUE: Multidetector CT imaging of the head and cervical spine was performed following the standard protocol without intravenous contrast. Multiplanar CT image reconstructions of the cervical spine were also generated. RADIATION DOSE REDUCTION: This exam was performed according to the departmental dose-optimization program which includes automated exposure control, adjustment of the mA and/or kV according to patient size and/or use of iterative reconstruction technique. COMPARISON:  CT Head and C Spine 06/19/22 FINDINGS: CT HEAD FINDINGS Brain: No evidence of acute infarction, hemorrhage, extra-axial collection or mass lesion/mass effect. There is sequela of moderate chronic microvascular ischemic change with generalized volume loss. There is ventriculomegaly with an acute callosal angle, unchanged. Vascular: No hyperdense vessel or  unexpected calcification. Skull: Soft tissue injury along the midline frontal scalp. No evidence of underlying calvarial fracture Sinuses/Orbits: No middle ear or mastoid effusion. Paranasal sinuses are notable for trace mucosal thickening in  the left maxillary sinus. Bilateral lens replacement. Orbits are otherwise unremarkable. Other: None. CT CERVICAL SPINE FINDINGS Alignment: Straightening of the normal cervical lordosis. Grade 1 anterolisthesis of C3 on C4. Skull base and vertebrae: No acute fracture. No primary bone lesion or focal pathologic process. Soft tissues and spinal canal: No prevertebral fluid or swelling. No visible canal hematoma. Mild soft tissue stranding in the right supraclavicular region, which could be posttraumatic. Disc levels:  No evidence of high-grade spinal stenosis. Upper chest: Negative. Other: None IMPRESSION: 1. No acute intracranial abnormality. 2. Soft tissue injury along the midline frontal scalp. No evidence of underlying calvarial fracture. 3. No acute cervical spine fracture. 4. Mild soft tissue stranding in the right supraclavicular region, which could be posttraumatic. Electronically Signed   By: Lorenza Cambridge M.D.   On: 07/03/2022 17:31   CT Cervical Spine Wo Contrast  Result Date: 07/03/2022 CLINICAL DATA:  fall EXAM: CT HEAD WITHOUT CONTRAST CT CERVICAL SPINE WITHOUT CONTRAST TECHNIQUE: Multidetector CT imaging of the head and cervical spine was performed following the standard protocol without intravenous contrast. Multiplanar CT image reconstructions of the cervical spine were also generated. RADIATION DOSE REDUCTION: This exam was performed according to the departmental dose-optimization program which includes automated exposure control, adjustment of the mA and/or kV according to patient size and/or use of iterative reconstruction technique. COMPARISON:  CT Head and C Spine 06/19/22 FINDINGS: CT HEAD FINDINGS Brain: No evidence of acute infarction, hemorrhage,  extra-axial collection or mass lesion/mass effect. There is sequela of moderate chronic microvascular ischemic change with generalized volume loss. There is ventriculomegaly with an acute callosal angle, unchanged. Vascular: No hyperdense vessel or unexpected calcification. Skull: Soft tissue injury along the midline frontal scalp. No evidence of underlying calvarial fracture Sinuses/Orbits: No middle ear or mastoid effusion. Paranasal sinuses are notable for trace mucosal thickening in the left maxillary sinus. Bilateral lens replacement. Orbits are otherwise unremarkable. Other: None. CT CERVICAL SPINE FINDINGS Alignment: Straightening of the normal cervical lordosis. Grade 1 anterolisthesis of C3 on C4. Skull base and vertebrae: No acute fracture. No primary bone lesion or focal pathologic process. Soft tissues and spinal canal: No prevertebral fluid or swelling. No visible canal hematoma. Mild soft tissue stranding in the right supraclavicular region, which could be posttraumatic. Disc levels:  No evidence of high-grade spinal stenosis. Upper chest: Negative. Other: None IMPRESSION: 1. No acute intracranial abnormality. 2. Soft tissue injury along the midline frontal scalp. No evidence of underlying calvarial fracture. 3. No acute cervical spine fracture. 4. Mild soft tissue stranding in the right supraclavicular region, which could be posttraumatic. Electronically Signed   By: Lorenza Cambridge M.D.   On: 07/03/2022 17:31   DG Pelvis Portable  Result Date: 07/03/2022 CLINICAL DATA:  Trauma, fall EXAM: PORTABLE PELVIS 1-2 VIEWS COMPARISON:  06/19/2022 FINDINGS: No displaced fracture no dislocation is seen. Proximal right femur is not included in its entirety. There is surgical fusion at L4-L5 level. IMPRESSION: No displaced fracture or dislocation is seen. Electronically Signed   By: Ernie Avena M.D.   On: 07/03/2022 16:59   DG Chest Portable 1 View  Result Date: 07/03/2022 CLINICAL DATA:  Trauma,  fall EXAM: PORTABLE CHEST 1 VIEW COMPARISON:  06/19/2022 FINDINGS: Transverse diameter of heart is increased. Increased markings are seen in right lower lung field. There is no focal consolidation. There is no pleural effusion or pneumothorax. Pacemaker battery is seen in the left infraclavicular region. IMPRESSION: Cardiomegaly. Increased interstitial markings are seen in right lower lung  field which may suggest a crowding of bronchovascular structures due to poor inspiration or atelectasis/pneumonia. Electronically Signed   By: Ernie Avena M.D.   On: 07/03/2022 16:58    Pending Labs Unresulted Labs (From admission, onward)     Start     Ordered   07/04/22 0500  Basic metabolic panel  Tomorrow morning,   R        07/03/22 1930   07/04/22 0500  CBC  Tomorrow morning,   R        07/03/22 1930            Vitals/Pain Today's Vitals   07/03/22 1621 07/03/22 1624 07/03/22 1630 07/03/22 2216  BP:   (!) 162/71 (!) 151/90  Pulse:  79 85 93  Resp:  14 13 17   Temp:   98 F (36.7 C)   SpO2:  100% 100% 100%  Weight: 81.6 kg     Height: 5\' 10"  (1.778 m)       Isolation Precautions No active isolations  Medications Medications  enoxaparin (LOVENOX) injection 40 mg (has no administration in time range)  0.9 %  sodium chloride infusion (has no administration in time range)  acetaminophen (TYLENOL) tablet 650 mg (has no administration in time range)    Or  acetaminophen (TYLENOL) suppository 650 mg (has no administration in time range)  magnesium hydroxide (MILK OF MAGNESIA) suspension 30 mL (has no administration in time range)  ondansetron (ZOFRAN) tablet 4 mg (has no administration in time range)    Or  ondansetron (ZOFRAN) injection 4 mg (has no administration in time range)  aspirin chewable tablet 81 mg (has no administration in time range)  traMADol (ULTRAM) tablet 50 mg (has no administration in time range)  atorvastatin (LIPITOR) tablet 20 mg (has no administration in  time range)  hydrALAZINE (APRESOLINE) tablet 25 mg (has no administration in time range)  metoprolol succinate (TOPROL-XL) 24 hr tablet 25 mg (has no administration in time range)  pantoprazole (PROTONIX) EC tablet 40 mg (has no administration in time range)  tamsulosin (FLOMAX) capsule 0.4 mg (has no administration in time range)  vitamin B-12 (CYANOCOBALAMIN) tablet 100 mcg (has no administration in time range)  ascorbic acid (VITAMIN C) tablet 500 mg (has no administration in time range)  cholecalciferol (VITAMIN D3) 25 MCG (1000 UNIT) tablet 1,000 Units (has no administration in time range)  zinc sulfate capsule 220 mg (has no administration in time range)  polyvinyl alcohol (LIQUIFILM TEARS) 1.4 % ophthalmic solution 1 drop (has no administration in time range)  albuterol (PROVENTIL) (2.5 MG/3ML) 0.083% nebulizer solution 2.5 mg (has no administration in time range)    Mobility walks with device     Focused Assessments GCS 14.   R Recommendations: See Admitting Provider Note  Report given to:   Additional Notes: Pt A&Ox1 self. Normally ambulatory with walker. Abrasions to left arm. Laceration to forehead.

## 2022-07-04 DIAGNOSIS — E871 Hypo-osmolality and hyponatremia: Secondary | ICD-10-CM

## 2022-07-04 DIAGNOSIS — F32A Depression, unspecified: Secondary | ICD-10-CM | POA: Insufficient documentation

## 2022-07-04 DIAGNOSIS — F02C2 Dementia in other diseases classified elsewhere, severe, with psychotic disturbance: Secondary | ICD-10-CM | POA: Diagnosis not present

## 2022-07-04 DIAGNOSIS — D649 Anemia, unspecified: Secondary | ICD-10-CM

## 2022-07-04 DIAGNOSIS — Z7401 Bed confinement status: Secondary | ICD-10-CM | POA: Diagnosis not present

## 2022-07-04 DIAGNOSIS — S0101XA Laceration without foreign body of scalp, initial encounter: Secondary | ICD-10-CM

## 2022-07-04 DIAGNOSIS — F419 Anxiety disorder, unspecified: Secondary | ICD-10-CM | POA: Insufficient documentation

## 2022-07-04 DIAGNOSIS — R296 Repeated falls: Secondary | ICD-10-CM | POA: Diagnosis not present

## 2022-07-04 DIAGNOSIS — I959 Hypotension, unspecified: Secondary | ICD-10-CM | POA: Diagnosis not present

## 2022-07-04 DIAGNOSIS — R404 Transient alteration of awareness: Secondary | ICD-10-CM | POA: Diagnosis not present

## 2022-07-04 DIAGNOSIS — R4182 Altered mental status, unspecified: Secondary | ICD-10-CM | POA: Diagnosis not present

## 2022-07-04 DIAGNOSIS — R9431 Abnormal electrocardiogram [ECG] [EKG]: Secondary | ICD-10-CM

## 2022-07-04 LAB — URINALYSIS, ROUTINE W REFLEX MICROSCOPIC
Bilirubin Urine: NEGATIVE
Glucose, UA: NEGATIVE mg/dL
Hgb urine dipstick: NEGATIVE
Ketones, ur: NEGATIVE mg/dL
Nitrite: NEGATIVE
Protein, ur: NEGATIVE mg/dL
Specific Gravity, Urine: 1.012 (ref 1.005–1.030)
pH: 6 (ref 5.0–8.0)

## 2022-07-04 LAB — BASIC METABOLIC PANEL
Anion gap: 8 (ref 5–15)
BUN: 21 mg/dL (ref 8–23)
CO2: 24 mmol/L (ref 22–32)
Calcium: 8.6 mg/dL — ABNORMAL LOW (ref 8.9–10.3)
Chloride: 105 mmol/L (ref 98–111)
Creatinine, Ser: 1.2 mg/dL (ref 0.61–1.24)
GFR, Estimated: 59 mL/min — ABNORMAL LOW (ref >60)
Glucose, Bld: 106 mg/dL — ABNORMAL HIGH (ref 70–99)
Potassium: 4.1 mmol/L (ref 3.5–5.1)
Sodium: 137 mmol/L (ref 135–145)

## 2022-07-04 LAB — CBC
HCT: 26.3 % — ABNORMAL LOW (ref 39.0–52.0)
Hemoglobin: 8.3 g/dL — ABNORMAL LOW (ref 13.0–17.0)
MCH: 24.8 pg — ABNORMAL LOW (ref 26.0–34.0)
MCHC: 31.6 g/dL (ref 30.0–36.0)
MCV: 78.5 fL — ABNORMAL LOW (ref 80.0–100.0)
Platelets: 180 10*3/uL (ref 150–400)
RBC: 3.35 MIL/uL — ABNORMAL LOW (ref 4.22–5.81)
RDW: 17.1 % — ABNORMAL HIGH (ref 11.5–15.5)
WBC: 6.9 10*3/uL (ref 4.0–10.5)
nRBC: 0 % (ref 0.0–0.2)

## 2022-07-04 MED ORDER — FERROUS SULFATE 325 (65 FE) MG PO TABS: 325.0000 mg | ORAL_TABLET | Freq: Two times a day (BID) | ORAL | Status: DC

## 2022-07-04 MED ORDER — CYANOCOBALAMIN 100 MCG PO TABS: 100.0000 ug | ORAL_TABLET | Freq: Every day | ORAL | 0 refills | Status: DC

## 2022-07-04 MED ORDER — FOLIC ACID 1 MG PO TABS
1.0000 mg | ORAL_TABLET | Freq: Every day | ORAL | Status: DC
  Administered 2022-07-04: 1 mg via ORAL
  Filled 2022-07-04: qty 1

## 2022-07-04 MED ORDER — FERROUS SULFATE 325 (65 FE) MG PO TABS: 325.0000 mg | ORAL_TABLET | Freq: Two times a day (BID) | ORAL | 3 refills | Status: DC

## 2022-07-04 MED ORDER — BACITRACIN ZINC 500 UNIT/GM EX OINT
TOPICAL_OINTMENT | Freq: Two times a day (BID) | CUTANEOUS | Status: DC
  Administered 2022-07-04 (×2): 31.5 via TOPICAL
  Filled 2022-07-04: qty 28.4

## 2022-07-04 MED ORDER — FOLIC ACID 1 MG PO TABS: 1.0000 mg | ORAL_TABLET | Freq: Every day | ORAL | Status: DC

## 2022-07-04 NOTE — Discharge Instructions (Signed)
Follow with Primary MD Merri Brunette, MD in 7 days   Activity: As tolerated with Full fall precautions use walker/cane & assistance as needed  Disposition SNF  Diet: Heart Healthy with feeding assistance and aspiration precautions.  Special Instructions: If you have smoked or chewed Tobacco  in the last 2 yrs please stop smoking, stop any regular Alcohol  and or any Recreational drug use.  On your next visit with your primary care physician please Get Medicines reviewed and adjusted.  Please request your Prim.MD to go over all Hospital Tests and Procedure/Radiological results at the follow up, please get all Hospital records sent to your Prim MD by signing hospital release before you go home.

## 2022-07-04 NOTE — Assessment & Plan Note (Signed)
-   We will continue clonazepam.

## 2022-07-04 NOTE — Assessment & Plan Note (Signed)
-   This could be related to minor blood loss from his Laceration Initially. - Will monitor H&H as he currently has good hemostasis.

## 2022-07-04 NOTE — Assessment & Plan Note (Addendum)
-   It is unclear if the patient had syncope. - He has subsequent forehead scalp laceration. - The patient will be admitted to an observation medically monitored bed. - Will check orthostatics q 12 hours. - The patient will be gently hydrated with IV normal saline and monitored for arrhythmias. - Given abnormal EKG we will follow serial troponins. - Will apply bacitracin to his laceration.

## 2022-07-04 NOTE — Assessment & Plan Note (Signed)
-   We will continue his antihypertensives. 

## 2022-07-04 NOTE — Assessment & Plan Note (Signed)
-   We will continue PPI therapy 

## 2022-07-04 NOTE — Evaluation (Signed)
Occupational Therapy Evaluation and Discharge Patient Details Name: Brandon Fischer MRN: [id number] DOB: 1933/05/25 Today's Date: 07/04/2022   History of Present Illness Pt is an 89 y.o. male who presented to the ER from SNF after an unwitnessed fall with a small scalp laceration on his forehead, he was admitted for further observation on 6/20. Pt witjh PMH significant for coronary artery disease, GERD, stage IIIa chronic kidney disease, hypertension, dyslipidemia, PSVT, and dementia.   Clinical Impression   Pt unable to provide history or PLOF. Per chart review pt previously performed functional transfers/mobility with a RW and received assistance for ADLs and ADLs from [facility] staff and family. Per chart review, pt is disoriented at baseline and has limited ability to answer questions. Pt now presents with generalized B UE weakness and decreased safety and independence with ADLs and functional transfers/mobility. During skilled OT evaluation, pt demonstrated ability to provide limited yes/no answers and to participate in familiar tasks of grooming, drinking water through a straw, and rolling in the bed when initiated by OT but was unable to follow 1-step directions with or without cues. Pt currently demonstrates ability to complete UB ADLs with Set up to Max assist and LB ADLs with Total assist +2. Secondary to current cognitive level pt is not a good candidate for acute skilled OT services at this time. Post acute discharge, OT recommends long-term institutional care with assistance for all ADLs, IADLs, bed mobility, functional transfers, and functional mobility with OT follow up at facility discretion.     Recommendations for follow up therapy are one component of a multi-disciplinary discharge planning process, led by the attending physician.  Recommendations may be updated based on patient status, additional functional criteria and insurance authorization.   Assistance Recommended at  Discharge Frequent or constant Supervision/Assistance  Patient can return home with the following Two people to help with walking and/or transfers;Two people to help with bathing/dressing/bathroom;Assistance with cooking/housework;Assistance with feeding;Direct supervision/assist for medications management;Direct supervision/assist for financial management;Assist for transportation;Help with stairs or ramp for entrance (Set up for self feeding)    Functional Status Assessment  Patient has had a recent decline in their functional status and/or demonstrates limited ability to make significant improvements in function in a reasonable and predictable amount of time  Equipment Recommendations  Other (comment) (Defer to next level of care)    Recommendations for Other Services       Precautions / Restrictions Precautions Precautions: Fall Restrictions Weight Bearing Restrictions: No      Mobility Bed Mobility Overal bed mobility: Needs Assistance Bed Mobility: Rolling Rolling: Mod assist         General bed mobility comments: Pt unable to follow 1 step commands for initiating bed mobility, but demonstrated ability to assist when OT initiated rolling L/R and moving into midline in the bed.    Transfers                   General transfer comment: deferred for pt/therapist safety this session.      Balance Overall balance assessment: History of Falls (Sitting EOB/functional transfer deferred this day for pt/therapist safety.)                                         ADL either performed or assessed with clinical judgement   ADL Overall ADL's : Needs assistance/impaired Eating/Feeding: Set up;Sitting Eating/Feeding Details (indicate cue type  and reason): Pt unable to follow novel 1-step instructions this session, but demonstrates ability to perfrom familiar activity of drinking appropriately when presented with a cup with lid and straw. Grooming: Wash/dry  hands;Wash/dry face;Set up;Bed level Grooming Details (indicate cue type and reason): Pt unable to follow novel 1-step instructions this session, but demonstrates ability to perform familiar activity of washing/drying face and hands when presented with a washcloth and towel. Upper Body Bathing: Maximal assistance;Bed level   Lower Body Bathing: Total assistance;Bed level;+2 for physical assistance;+2 for safety/equipment   Upper Body Dressing : Maximal assistance   Lower Body Dressing: Total assistance;+2 for safety/equipment;+2 for physical assistance       Toileting- Clothing Manipulation and Hygiene: Total assistance;Bed level;+2 for physical assistance;+2 for safety/equipment         General ADL Comments: Per chart review, pt receives assistance for ADLs at baseline. However, it is unclear how much assistance pt previously required. Functional transfers and mobility deferred for pt/therapist saftey this session.     Vision   Additional Comments: Pt unable to provide information regarding vision. Pt demonstrates ability to visually locate and accurately reach for and grasp wash cloth and cup.     Perception     Praxis Praxis Praxis tested?: Deficits Deficits: Organization;Ideomotor    Pertinent Vitals/Pain Pain Assessment Pain Assessment: Faces Faces Pain Scale: Hurts a little bit Pain Location: Uncertain. Pt unable to report. Pt with mild facial grimacing while repositioning in the bed. Pain Descriptors / Indicators: Grimacing Pain Intervention(s): Limited activity within patient's tolerance, Monitored during session, Repositioned     Hand Dominance Right (Per chart review)   Extremity/Trunk Assessment Upper Extremity Assessment Upper Extremity Assessment: Generalized weakness;Difficult to assess due to impaired cognition (Pt unable to follow directions for B UE AROM, PROM, AAROM, or MMT. When OT attempted AAROM, pt attempted to shake OT's hand or pull up in bed using  OT's arm.)   Lower Extremity Assessment Lower Extremity Assessment: Defer to PT evaluation       Communication Communication Communication: Receptive difficulties;Expressive difficulties;Other (comment) (Per chart, pt with limited ability to answer questions as baseline. Pt providing minimal yes/mo answers with questions this session.)   Cognition Arousal/Alertness: Lethargic Behavior During Therapy: Flat affect Overall Cognitive Status: History of cognitive impairments - at baseline                                 General Comments: Per chart review, pt is disoriented and has limited ability to answer questions at baseline.     General Comments  VSS on RA throughout session.    Exercises     Shoulder Instructions      Home Living Family/patient expects to be discharged to:: Skilled nursing facility                                 Additional Comments: Per chart, pt is a resident of [facility].      Prior Functioning/Environment Prior Level of Function : Patient poor historian/Family not available             Mobility Comments: Per chart review, pt was previously able to perform functional mobility with a RW. ADLs Comments: Per chart review, pt has assistance for ADLs and IADLs from SNF staff and family.        OT Problem List:  OT Treatment/Interventions:      OT Goals(Current goals can be found in the care plan section) Acute Rehab OT Goals Patient Stated Goal: Pt unable to state.  OT Frequency:      Co-evaluation              AM-PAC OT "6 Clicks" Daily Activity     Outcome Measure Help from another person eating meals?: A Little Help from another person taking care of personal grooming?: A Little Help from another person toileting, which includes using toliet, bedpan, or urinal?: Total Help from another person bathing (including washing, rinsing, drying)?: A Lot Help from another person to put on and  taking off regular upper body clothing?: A Lot Help from another person to put on and taking off regular lower body clothing?: Total 6 Click Score: 12   End of Session Nurse Communication: Mobility status;Other (comment) (Skilled OT not appropriate at this time.)  Activity Tolerance: Other (comment) (Pt limited by current cognitive level) Patient left: in bed;with call bell/phone within reach;with bed alarm set                   Time: 1610-9604 OT Time Calculation (min): 16 min Charges:  OT General Charges $OT Visit: 1 Visit OT Evaluation $OT Eval Low Complexity: 1 Low  Francis Doenges "Orson Eva., OTR/L, MA Acute Rehab [phone]   Lendon Colonel 07/04/2022, 1:01 PM

## 2022-07-04 NOTE — TOC Transition Note (Signed)
Transition of Care [facility]) - CM/SW Discharge Note   Patient Details  Name: Brandon Fischer MRN: [id number] Date of Birth: Dec 27, 1933  Transition of Care [facility]) CM/SW Contact:  Nicanor Bake Phone Number: 07/04/2022, 12:23 PM   Clinical Narrative: Maxie Better aware of patients discharge.  CSW spoke with the daughter, Brandon Fischer by phone to discuss transportation arrangements. Daughter requested [facility]. CSW contacted [facility]. DNR signed on chart.    Final next level of care: Skilled Nursing Facility Barriers to Discharge: No Barriers Identified   Patient Goals and CMS Choice CMS Medicare.gov Compare Post Acute Care list provided to:: Patient Represenative (must comment) Choice offered to / list presented to : Adult Children  Discharge Placement                  Patient to be transferred to facility by: [facility] Name of family member notified: Brandon Fischer, Daughter Patient and family notified of of transfer: 07/04/22  Discharge Plan and Services Additional resources added to the After Visit Summary for                                       Social Determinants of Health (SDOH) Interventions SDOH Screenings   Food Insecurity: No Food Insecurity (02/01/2022)  Housing: Low Risk  (02/01/2022)  Transportation Needs: No Transportation Needs (02/01/2022)  Utilities: Not At Risk (02/01/2022)  Tobacco Use: Medium Risk (07/03/2022)     Readmission Risk Interventions     No data to display

## 2022-07-04 NOTE — Discharge Summary (Signed)
Brandon Fischer:[id number] DOB: 09-27-1933 DOA: 07/03/2022  PCP: Merri Brunette, MD  Admit date: 07/03/2022  Discharge date: 07/04/2022  Admitted From: SNF   Disposition:  SNF   Recommendations for Outpatient Follow-up:   Follow up with PCP in 1-2 weeks  PCP Please obtain BMP/CBC, 2 view CXR in 1week,  (see Discharge instructions)   PCP Please follow up on the following pending results:    Home Health: None   Equipment/Devices: None  Consultations: None  Discharge Condition: Stable    CODE STATUS: Full    Diet Recommendation: Heart Healthy      Chief Complaint  Patient presents with   Fall     Brief history of present illness from the day of admission and additional interim summary     89 y.o. Caucasian male with medical history significant for coronary artery disease, GERD, stage IIIa chronic kidney disease, hypertension, dyslipidemia, and PSVT, who presented to the emergency room with acute onset of unwitnessed fall at his SNF.  I had a long discussion with patient's daughter who is claims that patient has moderate to advanced dementia at best he says yes or no, he has had multiple falls at [facility].  He was brought to the ER after his recent fall with a small scalp laceration on his forehead, he was admitted for further observation.                                                                 Hospital Course   Unwitnessed fall at SNF in a patient with advanced dementia with multiple previous falls.  Forehead scalp laceration, healing well with dry dressing, too weak to work with PT OT, had long discussion with patient's daughter, goal of care is supportive care directed towards comfort and no heroics, no further workup, will be discharged back to SNF, continue wound care for a scalp laceration with daily  dry dressing, bacitracin ointment twice a day.  Full fall precautions per nursing home protocol.   Hyponatremia - Due to mild dehydration hydrated with IV fluids.   Acute on chronic anemia - This could be related to minor blood loss from his Laceration Initially. - Will monitor H&H as he currently has good hemostasis.   Dyslipidemia - We will continue statin therapy.   BPH (benign prostatic hyperplasia) - We will continue Flomax.   GERD without esophagitis - We will continue PPI therapy.   Depression - We will continue Zoloft.   Anxiety - We will continue clonazepam.   Essential hypertension - We will continue his antihypertensives.   Discharge diagnosis     Principal Problem:   Unwitnessed fall Active Problems:   Hyponatremia   Dyslipidemia   Acute on chronic anemia   BPH (benign prostatic hyperplasia)   GERD without esophagitis  Essential hypertension   Anxiety   Depression   Abnormal EKG   Scalp laceration, initial encounter    Discharge instructions    Discharge Instructions     Diet - low sodium heart healthy   Complete by: As directed    Discharge instructions   Complete by: As directed    Follow with Primary MD Merri Brunette, MD in 7 days   Activity: As tolerated with Full fall precautions use walker/cane & assistance as needed  Disposition SNF  Diet: Heart Healthy with feeding assistance and aspiration precautions.  Special Instructions: If you have smoked or chewed Tobacco  in the last 2 yrs please stop smoking, stop any regular Alcohol  and or any Recreational drug use.  On your next visit with your primary care physician please Get Medicines reviewed and adjusted.  Please request your Prim.MD to go over all Hospital Tests and Procedure/Radiological results at the follow up, please get all Hospital records sent to your Prim MD by signing hospital release before you go home.   Discharge wound care:   Complete by: As directed     Objective scalp laceration site clean and dry.  Dry dressing daily.   Increase activity slowly   Complete by: As directed        Discharge Medications   Allergies as of 07/04/2022       Reactions   Bupropion Other (See Comments)   Disoriented and "allergic," per [facility]   Trazodone Other (See Comments)   Unsteady gait and "allergic," per [facility]   Olmesartan Rash        Medication List     TAKE these medications    albuterol 108 (90 Base) MCG/ACT inhaler Commonly known as: VENTOLIN HFA Inhale 2 puffs into the lungs every 4 (four) hours as needed for wheezing or shortness of breath.   ascorbic acid 500 MG tablet Commonly known as: VITAMIN C Take 500 mg by mouth in the morning.   aspirin 81 MG chewable tablet Commonly known as: Aspirin Childrens Chew 1 tablet (81 mg total) by mouth daily.   atorvastatin 20 MG tablet Commonly known as: LIPITOR Take 20 mg by mouth every evening.   cyanocobalamin 100 MCG tablet Take 1 tablet (100 mcg total) by mouth daily.   ferrous sulfate 325 (65 FE) MG tablet Take 1 tablet (325 mg total) by mouth 2 (two) times daily with a meal.   folic acid 1 MG tablet Commonly known as: FOLVITE Take 1 tablet (1 mg total) by mouth daily. Start taking on: July 05, 2022   hydrALAZINE 25 MG tablet Commonly known as: APRESOLINE Take 25 mg by mouth 3 (three) times daily. What changed: Another medication with the same name was removed. Continue taking this medication, and follow the directions you see here.   metoprolol succinate 25 MG 24 hr tablet Commonly known as: TOPROL-XL TAKE 1 TABLET (25 MG TOTAL) BY MOUTH DAILY. What changed: when to take this   MULTIVITAMIN/IRON PO Take 1 tablet by mouth daily with breakfast.   pantoprazole 40 MG tablet Commonly known as: PROTONIX Take 40 mg by mouth in the morning and at bedtime.   Refresh Tears 0.5 % Soln Generic drug: carboxymethylcellulose Place 1 drop into both eyes in the morning and at  bedtime.   tamsulosin 0.4 MG Caps capsule Commonly known as: FLOMAX Take 0.4 mg by mouth every evening.   traMADol 50 MG tablet Commonly known as: ULTRAM Take 1 tablet (50 mg total) by mouth every  6 (six) hours as needed for moderate pain or severe pain. Post-operatively What changed:  reasons to take this additional instructions   Vitamin D-3 25 MCG (1000 UT) Caps Take 1,000 Units by mouth daily with breakfast.   zinc gluconate 50 MG tablet Take 50 mg by mouth daily.               Discharge Care Instructions  (From admission, onward)           Start     Ordered   07/04/22 0000  Discharge wound care:       Comments: Objective scalp laceration site clean and dry.  Dry dressing daily.   07/04/22 1113             Follow-up Information     Merri Brunette, MD. Schedule an appointment as soon as possible for a visit in 1 week(s).   Specialty: Internal Medicine Contact information: [address] [place] [address] [place] [address] [phone]                 Major procedures and Radiology Reports - PLEASE review detailed and final reports thoroughly  -      CT Head Wo Contrast  Result Date: 07/03/2022 CLINICAL DATA:  fall EXAM: CT HEAD WITHOUT CONTRAST CT CERVICAL SPINE WITHOUT CONTRAST TECHNIQUE: Multidetector CT imaging of the head and cervical spine was performed following the standard protocol without intravenous contrast. Multiplanar CT image reconstructions of the cervical spine were also generated. RADIATION DOSE REDUCTION: This exam was performed according to the departmental dose-optimization program which includes automated exposure control, adjustment of the mA and/or kV according to patient size and/or use of iterative reconstruction technique. COMPARISON:  CT Head and C Spine 06/19/22 FINDINGS: CT HEAD FINDINGS Brain: No evidence of acute infarction, hemorrhage, extra-axial collection or mass lesion/mass effect. There is sequela of moderate  chronic microvascular ischemic change with generalized volume loss. There is ventriculomegaly with an acute callosal angle, unchanged. Vascular: No hyperdense vessel or unexpected calcification. Skull: Soft tissue injury along the midline frontal scalp. No evidence of underlying calvarial fracture Sinuses/Orbits: No middle ear or mastoid effusion. Paranasal sinuses are notable for trace mucosal thickening in the left maxillary sinus. Bilateral lens replacement. Orbits are otherwise unremarkable. Other: None. CT CERVICAL SPINE FINDINGS Alignment: Straightening of the normal cervical lordosis. Grade 1 anterolisthesis of C3 on C4. Skull base and vertebrae: No acute fracture. No primary bone lesion or focal pathologic process. Soft tissues and spinal canal: No prevertebral fluid or swelling. No visible canal hematoma. Mild soft tissue stranding in the right supraclavicular region, which could be posttraumatic. Disc levels:  No evidence of high-grade spinal stenosis. Upper chest: Negative. Other: None IMPRESSION: 1. No acute intracranial abnormality. 2. Soft tissue injury along the midline frontal scalp. No evidence of underlying calvarial fracture. 3. No acute cervical spine fracture. 4. Mild soft tissue stranding in the right supraclavicular region, which could be posttraumatic. Electronically Signed   By: Lorenza Cambridge M.D.   On: 07/03/2022 17:31   CT Cervical Spine Wo Contrast  Result Date: 07/03/2022 CLINICAL DATA:  fall EXAM: CT HEAD WITHOUT CONTRAST CT CERVICAL SPINE WITHOUT CONTRAST TECHNIQUE: Multidetector CT imaging of the head and cervical spine was performed following the standard protocol without intravenous contrast. Multiplanar CT image reconstructions of the cervical spine were also generated. RADIATION DOSE REDUCTION: This exam was performed according to the departmental dose-optimization program which includes automated exposure control, adjustment of the mA and/or kV according to patient  size  and/or use of iterative reconstruction technique. COMPARISON:  CT Head and C Spine 06/19/22 FINDINGS: CT HEAD FINDINGS Brain: No evidence of acute infarction, hemorrhage, extra-axial collection or mass lesion/mass effect. There is sequela of moderate chronic microvascular ischemic change with generalized volume loss. There is ventriculomegaly with an acute callosal angle, unchanged. Vascular: No hyperdense vessel or unexpected calcification. Skull: Soft tissue injury along the midline frontal scalp. No evidence of underlying calvarial fracture Sinuses/Orbits: No middle ear or mastoid effusion. Paranasal sinuses are notable for trace mucosal thickening in the left maxillary sinus. Bilateral lens replacement. Orbits are otherwise unremarkable. Other: None. CT CERVICAL SPINE FINDINGS Alignment: Straightening of the normal cervical lordosis. Grade 1 anterolisthesis of C3 on C4. Skull base and vertebrae: No acute fracture. No primary bone lesion or focal pathologic process. Soft tissues and spinal canal: No prevertebral fluid or swelling. No visible canal hematoma. Mild soft tissue stranding in the right supraclavicular region, which could be posttraumatic. Disc levels:  No evidence of high-grade spinal stenosis. Upper chest: Negative. Other: None IMPRESSION: 1. No acute intracranial abnormality. 2. Soft tissue injury along the midline frontal scalp. No evidence of underlying calvarial fracture. 3. No acute cervical spine fracture. 4. Mild soft tissue stranding in the right supraclavicular region, which could be posttraumatic. Electronically Signed   By: Lorenza Cambridge M.D.   On: 07/03/2022 17:31   DG Pelvis Portable  Result Date: 07/03/2022 CLINICAL DATA:  Trauma, fall EXAM: PORTABLE PELVIS 1-2 VIEWS COMPARISON:  06/19/2022 FINDINGS: No displaced fracture no dislocation is seen. Proximal right femur is not included in its entirety. There is surgical fusion at L4-L5 level. IMPRESSION: No displaced fracture or  dislocation is seen. Electronically Signed   By: Ernie Avena M.D.   On: 07/03/2022 16:59   DG Chest Portable 1 View  Result Date: 07/03/2022 CLINICAL DATA:  Trauma, fall EXAM: PORTABLE CHEST 1 VIEW COMPARISON:  06/19/2022 FINDINGS: Transverse diameter of heart is increased. Increased markings are seen in right lower lung field. There is no focal consolidation. There is no pleural effusion or pneumothorax. Pacemaker battery is seen in the left infraclavicular region. IMPRESSION: Cardiomegaly. Increased interstitial markings are seen in right lower lung field which may suggest a crowding of bronchovascular structures due to poor inspiration or atelectasis/pneumonia. Electronically Signed   By: Ernie Avena M.D.   On: 07/03/2022 16:58   CT Maxillofacial Wo Contrast  Result Date: 06/19/2022 CLINICAL DATA:  Fall from wheelchair with laceration to forehead and hematoma EXAM: CT MAXILLOFACIAL WITHOUT CONTRAST TECHNIQUE: Multidetector CT imaging of the maxillofacial structures was performed. Multiplanar CT image reconstructions were also generated. RADIATION DOSE REDUCTION: This exam was performed according to the departmental dose-optimization program which includes automated exposure control, adjustment of the mA and/or kV according to patient size and/or use of iterative reconstruction technique. COMPARISON:  None Available. FINDINGS: Osseous: No fracture or mandibular dislocation. No destructive process. Orbits: Negative. No traumatic or inflammatory finding. Sinuses: Mild mucosal thickening in the left maxillary sinus. The paranasal sinuses are otherwise well aerated Soft tissues: Right forehead small hematoma. Limited intracranial: Reported separately. IMPRESSION: 1. No acute facial bone fracture. Electronically Signed   By: Minerva Fester M.D.   On: 06/19/2022 22:26   CT Cervical Spine Wo Contrast  Result Date: 06/19/2022 CLINICAL DATA:  Neck trauma, fall from wheelchair. EXAM: CT CERVICAL  SPINE WITHOUT CONTRAST TECHNIQUE: Multidetector CT imaging of the cervical spine was performed without intravenous contrast. Multiplanar CT image reconstructions were also generated. RADIATION DOSE REDUCTION:  This exam was performed according to the departmental dose-optimization program which includes automated exposure control, adjustment of the mA and/or kV according to patient size and/or use of iterative reconstruction technique. COMPARISON:  04/28/2022. FINDINGS: Alignment: There is mild anterolisthesis at C3-C4, C5-C6, and C7-T1. Skull base and vertebrae: No acute fracture. Soft tissues and spinal canal: No prevertebral fluid or swelling. No visible canal hematoma. Disc levels: Multilevel intervertebral disc space narrowing, disc osteophyte formation, and facet arthropathy. Upper chest: No acute abnormality. Other: Carotid artery calcifications. IMPRESSION: Multilevel degenerative changes in the cervical spine without evidence of acute fracture. Electronically Signed   By: Thornell Sartorius M.D.   On: 06/19/2022 22:21   CT Head Wo Contrast  Result Date: 06/19/2022 CLINICAL DATA:  Trauma EXAM: CT HEAD WITHOUT CONTRAST TECHNIQUE: Contiguous axial images were obtained from the base of the skull through the vertex without intravenous contrast. RADIATION DOSE REDUCTION: This exam was performed according to the departmental dose-optimization program which includes automated exposure control, adjustment of the mA and/or kV according to patient size and/or use of iterative reconstruction technique. COMPARISON:  MRI head 02/06/2022 FINDINGS: Brain: No evidence of acute infarction, hemorrhage, hydrocephalus, extra-axial collection or mass lesion/mass effect. There is stable moderate diffuse atrophy and mild periventricular white matter hypodensity, likely chronic small vessel ischemic change. Vascular: Atherosclerotic calcifications are present within the cavernous internal carotid arteries. Skull: Normal. Negative for  fracture or focal lesion. Sinuses/Orbits: No acute finding. Other: There is right frontal scalp soft tissue swelling. IMPRESSION: 1. No acute intracranial process. 2. Right frontal scalp soft tissue swelling. 3. Stable moderate diffuse atrophy and mild chronic small vessel ischemic change. Electronically Signed   By: Darliss Cheney M.D.   On: 06/19/2022 22:19   DG Chest Portable 1 View  Result Date: 06/19/2022 CLINICAL DATA:  Recent fall with chest pain, initial encounter EXAM: PORTABLE CHEST 1 VIEW COMPARISON:  04/28/2022 FINDINGS: Cardiac shadow is enlarged. Pacing device is again seen and stable. Aortic calcifications are noted. The lungs are clear. No acute bony abnormality is noted. IMPRESSION: No acute abnormality noted. Electronically Signed   By: Alcide Clever M.D.   On: 06/19/2022 21:38   DG Pelvis Portable  Result Date: 06/19/2022 CLINICAL DATA:  Recent fall with pelvic pain, initial encounter EXAM: PORTABLE PELVIS 1-2 VIEWS COMPARISON:  02/22/2019 FINDINGS: Pelvic ring is intact. No acute fracture or dislocation is noted. Postsurgical changes in the lower lumbar spine are noted. No soft tissue changes are seen. IMPRESSION: No acute abnormality noted. Electronically Signed   By: Alcide Clever M.D.   On: 06/19/2022 21:37   DG Wrist Complete Right  Result Date: 06/13/2022 CLINICAL DATA:  Possible lunate fracture EXAM: RIGHT WRIST - COMPLETE 3+ VIEW COMPARISON:  Hand series today FINDINGS: No visible fracture in the right wrist. In particular, no visible lunate fracture. Arthritic changes within the right wrist. Vascular calcifications. Soft tissues are intact. IMPRESSION: No visible wrist fracture. Electronically Signed   By: Charlett Nose M.D.   On: 06/13/2022 01:09   DG Hand Complete Right  Result Date: 06/12/2022 CLINICAL DATA:  fall middle finger ?fx EXAM: RIGHT HAND - COMPLETE 3+ VIEW COMPARISON:  None Available. FINDINGS: There is no evidence of fracture or dislocation of the bones of the  right hand. Question fracture of the lunate. At least moderate degenerative changes of the hand and wrist. Severe degenerative changes of the first digit metacarpophalangeal joint. Soft tissues are unremarkable. IMPRESSION: 1. No acute displaced fracture or dislocation of the right  hand. 2. Question fracture of the lunate. Recommend dedicated 3-4 view right wrist radiographs. Electronically Signed   By: Tish Frederickson M.D.   On: 06/12/2022 23:58    Micro Results    No results found for this or any previous visit (from the past 240 hour(s)).  Today   Subjective    Tobin Chad today in bed unable to answer questions, says yes to everything. Objective   Blood pressure (!) 147/51, pulse 98, temperature 98.1 F (36.7 C), temperature source Oral, resp. rate 19, height 5\' 10"  (1.778 m), weight 81.6 kg, SpO2 97 %.   Intake/Output Summary (Last 24 hours) at 07/04/2022 1113 Last data filed at 07/04/2022 0548 Gross per 24 hour  Intake 583.94 ml  Output --  Net 583.94 ml    Exam  Awake but confused, forehead scalp laceration under bandage, moves all 4 extremities by himself, [place].AT,PERRAL Supple Neck,   Symmetrical Chest wall movement, Good air movement bilaterally, CTAB RRR,No Gallops,   +ve B.Sounds, Abd Soft, Non tender,  No Cyanosis, Clubbing or edema    Data Review   Recent Labs  Lab 07/03/22 1721 07/04/22 0421  WBC 5.9 6.9  HGB 7.5* 8.3*  HCT 24.2* 26.3*  PLT 183 180  MCV 78.8* 78.5*  MCH 24.4* 24.8*  MCHC 31.0 31.6  RDW 17.2* 17.1*  LYMPHSABS 0.7  --   MONOABS 0.4  --   EOSABS 0.0  --   BASOSABS 0.0  --     Recent Labs  Lab 07/03/22 1721 07/04/22 0421  NA 133* 137  K 4.1 4.1  CL 102 105  CO2 23 24  ANIONGAP 8 8  GLUCOSE 120* 106*  BUN 27* 21  CREATININE 1.29* 1.20  AST 26  --   ALT 18  --   ALKPHOS 86  --   BILITOT 0.7  --   ALBUMIN 3.0*  --   BNP 308.8*  --   CALCIUM 8.2* 8.6*    Total Time in preparing paper work, data evaluation and todays  exam - 35 minutes  Signature  -    Susa Raring M.D on 07/04/2022 at 11:13 AM   -  To page go to [email or web address]

## 2022-07-04 NOTE — Assessment & Plan Note (Signed)
-   The patient will be hydrated with IV normal saline and will follow sodium level. 

## 2022-07-04 NOTE — Progress Notes (Signed)
Pt is scheduled to be discharged to SNF.Assigned nurse could not be reached out for handoff.

## 2022-07-04 NOTE — Assessment & Plan Note (Signed)
-   We will continue statin therapy. 

## 2022-07-04 NOTE — Assessment & Plan Note (Signed)
-   We will continue Zoloft 

## 2022-07-04 NOTE — Progress Notes (Signed)
   07/04/22 1610 07/04/22 0625 07/04/22 0626  Orthostatic Lying   BP- Lying 141/52  --   --   Pulse- Lying 78  --   --   Orthostatic Sitting  BP- Sitting  --  131/53  --   Pulse- Sitting  --  73  --   Orthostatic Standing at 0 minutes  BP- Standing at 0 minutes  --   --   (pt unable to tolerate standing; staff members were able to hold him up for 5 seconds before he fell back onto the bed)

## 2022-07-04 NOTE — Assessment & Plan Note (Signed)
-   We will continue Flomax 

## 2022-07-04 NOTE — Evaluation (Signed)
Physical Therapy Evaluation Patient Details Name: Brandon Fischer MRN: [id number] DOB: 1933/12/06 Today's Date: 07/04/2022  History of Present Illness  Pt is an 89 y.o. male who presented to the ER from SNF after an unwitnessed fall with a small scalp laceration on his forehead, he was admitted for further observation on 6/20. Pt witjh PMH significant for coronary artery disease, GERD, stage IIIa chronic kidney disease, hypertension, dyslipidemia, PSVT, and dementia.  Clinical Impression  Pt presents today with impaired functional mobility due to impaired strength, balance, cognition, and activity tolerance. Pt was at a facility prior to admission, no family present and pt is a poor historian so unable to fully obtain PLOF. Per chart review, pt was ambulatory with RW, but also from previous admission pt had a fall trying to transfer out of his wheelchair. Pt requiring total assist to transfer to EOB, able to participate in rolling. At EOB, pt requiring max to TotalA to maintain balance, sitting with increased trunk flexion, which appears to be pt's preference in bed as pt was sidelying with hip and knee flexion upon arrival. Acute PT will follow up with pt during admission to progress mobility and reduce caregiver burden as able, recommend return to prior setting upon discharge.      Recommendations for follow up therapy are one component of a multi-disciplinary discharge planning process, led by the attending physician.  Recommendations may be updated based on patient status, additional functional criteria and insurance authorization.  Follow Up Recommendations Can patient physically be transported by private vehicle: No     Assistance Recommended at Discharge Frequent or constant Supervision/Assistance  Patient can return home with the following  Two people to help with walking and/or transfers;Assistance with cooking/housework;Direct supervision/assist for medications management;Direct  supervision/assist for financial management;Assist for transportation;Help with stairs or ramp for entrance    Equipment Recommendations None recommended by PT  Recommendations for Other Services       Functional Status Assessment Patient has had a recent decline in their functional status and/or demonstrates limited ability to make significant improvements in function in a reasonable and predictable amount of time     Precautions / Restrictions Precautions Precautions: Fall Restrictions Weight Bearing Restrictions: No      Mobility  Bed Mobility Overal bed mobility: Needs Assistance Bed Mobility: Rolling, Sidelying to Sit, Sit to Sidelying Rolling: Mod assist Sidelying to sit: Total assist     Sit to sidelying: Total assist General bed mobility comments: pt able to assist in rolling with initiation of BLE and trunk. TotalA to get to EOB today, pt attempting to help with pulling up with RUE but unable to provide assist    Transfers                   General transfer comment: deferred, unable to maintain balance at EOB    Ambulation/Gait               General Gait Details: unable  Stairs            Wheelchair Mobility    Modified Rankin (Stroke Patients Only)       Balance Overall balance assessment: Needs assistance, History of Falls Sitting-balance support: Single extremity supported, Feet supported Sitting balance-Leahy Scale: Zero Sitting balance - Comments: requiring max-total A for balance at EOB  Pertinent Vitals/Pain Pain Assessment Pain Assessment: Faces Faces Pain Scale: Hurts a little bit Pain Location: generalized Pain Descriptors / Indicators: Aching, Grimacing Pain Intervention(s): Limited activity within patient's tolerance, Monitored during session, Repositioned    Home Living Family/patient expects to be discharged to:: Skilled nursing facility                    Additional Comments: Per chart, pt is a resident of [facility].    Prior Function Prior Level of Function : Patient poor historian/Family not available             Mobility Comments: per chart review, pt ambulates with RW, previously admitted after trying to get up from his wheelchair, no family present to verify assist levels or primary mode of ambulation ADLs Comments: Per chart review, pt has assistance for ADLs and IADLs from SNF staff and family.     Hand Dominance   Dominant Hand: Right (Per chart review)    Extremity/Trunk Assessment   Upper Extremity Assessment Upper Extremity Assessment: Defer to OT evaluation    Lower Extremity Assessment Lower Extremity Assessment: Generalized weakness;Difficult to assess due to impaired cognition (pt resting with BLE in hip and knee flexion, maintaining once EOB, difficult for formal MMT due to cognition)    Cervical / Trunk Assessment Cervical / Trunk Assessment: Kyphotic  Communication   Communication: Expressive difficulties;Receptive difficulties  Cognition Arousal/Alertness: Awake/alert Behavior During Therapy: Flat affect Overall Cognitive Status: History of cognitive impairments - at baseline                                 General Comments: pt with dementia at baseline, intermittently able to answer yes/no questions, participating in rolling with initiation, able to reach for hands        General Comments General comments (skin integrity, edema, etc.): VSS on room air throughout session    Exercises     Assessment/Plan    PT Assessment Patient needs continued PT services  PT Problem List Decreased strength;Decreased range of motion;Decreased activity tolerance;Decreased balance;Decreased mobility;Decreased cognition;Decreased knowledge of use of DME;Decreased safety awareness;Decreased knowledge of precautions       PT Treatment Interventions DME instruction;Gait training;Functional  mobility training;Therapeutic activities;Therapeutic exercise;Balance training;Neuromuscular re-education;Cognitive remediation;Patient/family education;Wheelchair mobility training    PT Goals (Current goals can be found in the Care Plan section)  Acute Rehab PT Goals Patient Stated Goal: none stated today' PT Goal Formulation: Patient unable to participate in goal setting Time For Goal Achievement: 07/18/22 Potential to Achieve Goals: Poor    Frequency Min 1X/week     Co-evaluation               AM-PAC PT "6 Clicks" Mobility  Outcome Measure Help needed turning from your back to your side while in a flat bed without using bedrails?: A Lot Help needed moving from lying on your back to sitting on the side of a flat bed without using bedrails?: Total Help needed moving to and from a bed to a chair (including a wheelchair)?: Total Help needed standing up from a chair using your arms (e.g., wheelchair or bedside chair)?: Total Help needed to walk in hospital room?: Total Help needed climbing 3-5 steps with a railing? : Total 6 Click Score: 7    End of Session   Activity Tolerance: Patient limited by fatigue Patient left: in bed;with call bell/phone within reach;with bed alarm set Nurse  Communication: Mobility status PT Visit Diagnosis: Muscle weakness (generalized) (M62.81);Repeated falls (R29.6);Other abnormalities of gait and mobility (R26.89)    Time: 1610-9604 PT Time Calculation (min) (ACUTE ONLY): 11 min   Charges:   PT Evaluation $PT Eval Moderate Complexity: 1 Mod          Lindalou Hose, PT DPT Acute Rehabilitation Services Office [phone]   Leonie Man 07/04/2022, 1:46 PM

## 2022-07-07 DIAGNOSIS — R296 Repeated falls: Secondary | ICD-10-CM | POA: Diagnosis not present

## 2022-07-07 DIAGNOSIS — I2510 Atherosclerotic heart disease of native coronary artery without angina pectoris: Secondary | ICD-10-CM | POA: Diagnosis not present

## 2022-07-07 DIAGNOSIS — K219 Gastro-esophageal reflux disease without esophagitis: Secondary | ICD-10-CM | POA: Diagnosis not present

## 2022-07-07 DIAGNOSIS — G2581 Restless legs syndrome: Secondary | ICD-10-CM | POA: Diagnosis not present

## 2022-07-07 DIAGNOSIS — N210 Calculus in bladder: Secondary | ICD-10-CM | POA: Diagnosis not present

## 2022-07-07 DIAGNOSIS — H04123 Dry eye syndrome of bilateral lacrimal glands: Secondary | ICD-10-CM | POA: Diagnosis not present

## 2022-07-07 DIAGNOSIS — D649 Anemia, unspecified: Secondary | ICD-10-CM | POA: Diagnosis not present

## 2022-07-07 DIAGNOSIS — E871 Hypo-osmolality and hyponatremia: Secondary | ICD-10-CM | POA: Diagnosis not present

## 2022-07-07 DIAGNOSIS — I480 Paroxysmal atrial fibrillation: Secondary | ICD-10-CM | POA: Diagnosis not present

## 2022-07-07 DIAGNOSIS — N202 Calculus of kidney with calculus of ureter: Secondary | ICD-10-CM | POA: Diagnosis not present

## 2022-07-07 DIAGNOSIS — N400 Enlarged prostate without lower urinary tract symptoms: Secondary | ICD-10-CM | POA: Diagnosis not present

## 2022-07-07 DIAGNOSIS — I10 Essential (primary) hypertension: Secondary | ICD-10-CM | POA: Diagnosis not present

## 2022-07-07 DIAGNOSIS — R310 Gross hematuria: Secondary | ICD-10-CM | POA: Diagnosis not present

## 2022-07-07 DIAGNOSIS — E785 Hyperlipidemia, unspecified: Secondary | ICD-10-CM | POA: Diagnosis not present

## 2022-07-07 DIAGNOSIS — G253 Myoclonus: Secondary | ICD-10-CM | POA: Diagnosis not present

## 2022-07-08 DIAGNOSIS — L89153 Pressure ulcer of sacral region, stage 3: Secondary | ICD-10-CM | POA: Diagnosis not present

## 2022-07-09 ENCOUNTER — Encounter (HOSPITAL_COMMUNITY): Payer: Self-pay

## 2022-07-09 ENCOUNTER — Emergency Department (HOSPITAL_COMMUNITY)
Admission: EM | Admit: 2022-07-09 | Discharge: 2022-07-10 | Disposition: A | Discharge status: Skilled Nursing Facility | Payer: Medicare Other | Attending: Emergency Medicine | Admitting: Emergency Medicine

## 2022-07-09 ENCOUNTER — Other Ambulatory Visit: Payer: Self-pay

## 2022-07-09 DIAGNOSIS — Z7982 Long term (current) use of aspirin: Secondary | ICD-10-CM | POA: Diagnosis not present

## 2022-07-09 DIAGNOSIS — I2510 Atherosclerotic heart disease of native coronary artery without angina pectoris: Secondary | ICD-10-CM | POA: Diagnosis not present

## 2022-07-09 DIAGNOSIS — I129 Hypertensive chronic kidney disease with stage 1 through stage 4 chronic kidney disease, or unspecified chronic kidney disease: Secondary | ICD-10-CM | POA: Diagnosis not present

## 2022-07-09 DIAGNOSIS — R410 Disorientation, unspecified: Secondary | ICD-10-CM | POA: Insufficient documentation

## 2022-07-09 DIAGNOSIS — D649 Anemia, unspecified: Secondary | ICD-10-CM | POA: Diagnosis not present

## 2022-07-09 DIAGNOSIS — Z79899 Other long term (current) drug therapy: Secondary | ICD-10-CM | POA: Insufficient documentation

## 2022-07-09 DIAGNOSIS — F0390 Unspecified dementia without behavioral disturbance: Secondary | ICD-10-CM | POA: Diagnosis not present

## 2022-07-09 DIAGNOSIS — N189 Chronic kidney disease, unspecified: Secondary | ICD-10-CM | POA: Diagnosis not present

## 2022-07-09 DIAGNOSIS — R7889 Finding of other specified substances, not normally found in blood: Secondary | ICD-10-CM | POA: Diagnosis not present

## 2022-07-09 DIAGNOSIS — I10 Essential (primary) hypertension: Secondary | ICD-10-CM | POA: Diagnosis not present

## 2022-07-09 DIAGNOSIS — R7989 Other specified abnormal findings of blood chemistry: Secondary | ICD-10-CM | POA: Diagnosis present

## 2022-07-09 LAB — CBC
HCT: 22.4 % — ABNORMAL LOW (ref 39.0–52.0)
Hemoglobin: 7 g/dL — ABNORMAL LOW (ref 13.0–17.0)
MCH: 24.8 pg — ABNORMAL LOW (ref 26.0–34.0)
MCHC: 31.3 g/dL (ref 30.0–36.0)
MCV: 79.4 fL — ABNORMAL LOW (ref 80.0–100.0)
Platelets: 186 10*3/uL (ref 150–400)
RBC: 2.82 MIL/uL — ABNORMAL LOW (ref 4.22–5.81)
RDW: 16.3 % — ABNORMAL HIGH (ref 11.5–15.5)
WBC: 4.7 10*3/uL (ref 4.0–10.5)
nRBC: 0 % (ref 0.0–0.2)

## 2022-07-09 LAB — COMPREHENSIVE METABOLIC PANEL
ALT: 28 U/L (ref 0–44)
AST: 31 U/L (ref 15–41)
Albumin: 3 g/dL — ABNORMAL LOW (ref 3.5–5.0)
Alkaline Phosphatase: 77 U/L (ref 38–126)
Anion gap: 6 (ref 5–15)
BUN: 32 mg/dL — ABNORMAL HIGH (ref 8–23)
CO2: 25 mmol/L (ref 22–32)
Calcium: 8.3 mg/dL — ABNORMAL LOW (ref 8.9–10.3)
Chloride: 106 mmol/L (ref 98–111)
Creatinine, Ser: 1.07 mg/dL (ref 0.61–1.24)
GFR, Estimated: >60 mL/min (ref >60)
Glucose, Bld: 106 mg/dL — ABNORMAL HIGH (ref 70–99)
Potassium: 4 mmol/L (ref 3.5–5.1)
Sodium: 137 mmol/L (ref 135–145)
Total Bilirubin: 0.9 mg/dL (ref 0.3–1.2)
Total Protein: 6.1 g/dL — ABNORMAL LOW (ref 6.5–8.1)

## 2022-07-09 LAB — TYPE AND SCREEN
ABO/RH(D): O POS
Antibody Screen: NEGATIVE

## 2022-07-09 NOTE — ED Notes (Signed)
Transportation arranged for pt return to facility via [facility] at this time.

## 2022-07-09 NOTE — ED Triage Notes (Signed)
Patient BIB [facility] from [facility]. Has dementia. Had a CBC this morning. Hemoglobin 6.1. Recent hospital admission. Pale.

## 2022-07-09 NOTE — ED Notes (Signed)
Report called back to [facility] to [employer], LPN. Informed that since pt was not transfused here today that facility NP will likely send pt back to ED for evaluation d/t x2 hgb of 6.1 at the facility. LPN notified that [facility] to transport pt home awaiting their arrival at this time.

## 2022-07-09 NOTE — Discharge Instructions (Signed)
You were seen for your anemia in the emergency department. Your hemoglobin was 7.0.  After discussing with your family they wanted to hold off on a blood transfusion today.  At home, please continue to take your iron.    Check your MyChart online for the results of any tests that had not resulted by the time you left the emergency department.   Follow-up with your primary doctor in 2-3 days regarding your visit.    Return immediately to the emergency department if you experience any of the following: Dizziness, bleeding, or any other concerning symptoms.    Thank you for visiting our Emergency Department. It was a pleasure taking care of you today.

## 2022-07-09 NOTE — ED Provider Notes (Addendum)
[place] EMERGENCY DEPARTMENT AT [facility] Provider Note   CSN: [id number] Arrival date & time: 07/09/22  1846     History  Chief Complaint  Patient presents with   Abnormal Lab    Brandon Fischer is a 89 y.o. male.  89 year old male with a history of CAD, nonobstructive CAD, CKD, GERD, hypertension, hyperlipidemia, and chronic anemia who presents emergency department with low blood counts.  Patient had labs that were drawn at [facility] facility with a hemoglobin of 6.1.  Was referred to the emergency department for additional evaluation.  Patient unable to provide history due to his dementia.  Contacted his daughter Zella Ball and discussed the case.  Says that he has a history of baseline anemia and is on iron at home.  Unaware of any GI bleeding recently.  Called the facility and was unable to get additional history from them.  Based on chart review has had a bone marrow biopsy for his anemia as well as labs and they think it may be due to splenomegaly because he has had pancytopenia in the past but they are unsure the exact etiology of his anemia.       Home Medications Prior to Admission medications   Medication Sig Start Date End Date Taking? Authorizing Provider  albuterol (VENTOLIN HFA) 108 (90 Base) MCG/ACT inhaler Inhale 2 puffs into the lungs every 4 (four) hours as needed for wheezing or shortness of breath.    [provider]  ascorbic acid (VITAMIN C) 500 MG tablet Take 500 mg by mouth in the morning.    [provider]  aspirin (ASPIRIN CHILDRENS) 81 MG chewable tablet Chew 1 tablet (81 mg total) by mouth daily. 07/20/20   Yates Decamp, MD  atorvastatin (LIPITOR) 20 MG tablet Take 20 mg by mouth every evening.    [provider]  Cholecalciferol (VITAMIN D-3) 25 MCG (1000 UT) CAPS Take 1,000 Units by mouth daily with breakfast.    [provider]  cyanocobalamin 100 MCG tablet Take 1 tablet (100 mcg total) by mouth  daily. 07/04/22   Leroy Sea, MD  ferrous sulfate 325 (65 FE) MG tablet Take 1 tablet (325 mg total) by mouth 2 (two) times daily with a meal. 07/04/22   Leroy Sea, MD  folic acid (FOLVITE) 1 MG tablet Take 1 tablet (1 mg total) by mouth daily. 07/05/22   Leroy Sea, MD  hydrALAZINE (APRESOLINE) 25 MG tablet Take 25 mg by mouth 3 (three) times daily.    [provider]  metoprolol succinate (TOPROL-XL) 25 MG 24 hr tablet TAKE 1 TABLET (25 MG TOTAL) BY MOUTH DAILY. Patient taking differently: Take 25 mg by mouth in the morning. 07/15/21 02/01/23  Yates Decamp, MD  Multiple Vitamins-Iron (MULTIVITAMIN/IRON PO) Take 1 tablet by mouth daily with breakfast.    [provider]  pantoprazole (PROTONIX) 40 MG tablet Take 40 mg by mouth in the morning and at bedtime.     [provider]  REFRESH TEARS 0.5 % SOLN Place 1 drop into both eyes in the morning and at bedtime.    [provider]  tamsulosin (FLOMAX) 0.4 MG CAPS capsule Take 0.4 mg by mouth every evening.    [provider]  traMADol (ULTRAM) 50 MG tablet Take 1 tablet (50 mg total) by mouth every 6 (six) hours as needed for moderate pain or severe pain. Post-operatively Patient taking differently: Take 50 mg by mouth every 6 (six) hours as  needed (for pain). 02/06/22   Regalado, Belkys A, MD  zinc gluconate 50 MG tablet Take 50 mg by mouth daily.    [provider]      Allergies    Bupropion, Trazodone, and Olmesartan    Review of Systems   Review of Systems  Physical Exam Updated Vital Signs BP (!) 149/76   Pulse 77   Temp 99.4 F (37.4 C) (Oral)   Resp 18   Ht 5\' 10"  (1.778 m)   Wt 82 kg   SpO2 100%   BMI 25.94 kg/m  Physical Exam Vitals and nursing note reviewed.  Constitutional:      General: He is not in acute distress.    Appearance: He is well-developed.     Comments: Disoriented.  At baseline.  HENT:     Head: Normocephalic and atraumatic.     Right  Ear: External ear normal.     Left Ear: External ear normal.     Nose: Nose normal.  Eyes:     Extraocular Movements: Extraocular movements intact.     Conjunctiva/sclera: Conjunctivae normal.     Pupils: Pupils are equal, round, and reactive to light.  Cardiovascular:     Rate and Rhythm: Normal rate and regular rhythm.     Heart sounds: Normal heart sounds.  Pulmonary:     Effort: Pulmonary effort is normal. No respiratory distress.     Breath sounds: Normal breath sounds.  Abdominal:     General: There is no distension.     Palpations: Abdomen is soft. There is no mass.     Tenderness: There is no abdominal tenderness. There is no guarding.  Musculoskeletal:     Cervical back: Normal range of motion and neck supple.  Skin:    General: Skin is warm and dry.  Neurological:     General: No focal deficit present.     Mental Status: He is alert. Mental status is at baseline.  Psychiatric:        Mood and Affect: Mood normal.        Behavior: Behavior normal.     ED Results / Procedures / Treatments   Labs (all labs ordered are listed, but only abnormal results are displayed) Labs Reviewed  CBC - Abnormal; Notable for the following components:      Result Value   RBC 2.82 (*)    Hemoglobin 7.0 (*)    HCT 22.4 (*)    MCV 79.4 (*)    MCH 24.8 (*)    RDW 16.3 (*)    All other components within normal limits  COMPREHENSIVE METABOLIC PANEL - Abnormal; Notable for the following components:   Glucose, Bld 106 (*)    BUN 32 (*)    Calcium 8.3 (*)    Total Protein 6.1 (*)    Albumin 3.0 (*)    All other components within normal limits  TYPE AND SCREEN    EKG None  Radiology No results found.  Procedures Procedures    Medications Ordered in ED Medications - No data to display  ED Course/ Medical Decision Making/ A&P Clinical Course as of 07/09/22 2213  Wed Jul 09, 2022  2147 Hemoglobin(!): 7.0 Baseline of 8 [RP]  2153 No iron available per pharmacy [RP]     Clinical Course User Index [RP] Rondel Baton, MD  Medical Decision Making Amount and/or Complexity of Data Reviewed Labs: ordered. Decision-making details documented in ED Course.   Brandon Fischer is a 89 y.o. male with comorbidities that complicate the patient evaluation including CAD, nonobstructive CAD, CKD, GERD, hypertension, hyperlipidemia, and chronic anemia who presents emergency department with low blood counts.   Initial Ddx:  Symptomatic anemia, lab error, GI bleed, splenic sequestration  MDM/Course:  Patient does not appear to be having symptoms of anemia at this time.  No reports of GI bleed recently does have chronic anemia.  Had a hemoglobin redrawn today which showed that it was 7.0.  Did discuss this with his family who states that he does not have any history of stents or stroke or severe CAD.  After going over the risks and benefits of transfusion at this time family Zella Ball) decided that they did not want a transfusion today and will have him follow-up with his primary doctor for repeat lab work.  Upon re-evaluation remained hemodynamically stable and at his baseline.  This patient presents to the ED for concern of complaints listed in HPI, this involves an extensive number of treatment options, and is a complaint that carries with it a high risk of complications and morbidity. Disposition including potential need for admission considered.   Dispo: DC to Facility  Additional history obtained from family Records reviewed ED Visit Notes The following labs were independently interpreted: CBC and show chronic anemia I personally reviewed and interpreted cardiac monitoring: normal sinus rhythm  I have reviewed the patients home medications and made adjustments as needed Social Determinants of health:  Elderly SNF resident         Final Clinical Impression(s) / ED Diagnoses Final diagnoses:  Anemia, unspecified type  Dementia,  unspecified dementia severity, unspecified dementia type, unspecified whether behavioral, psychotic, or mood disturbance or anxiety [facility])    Rx / DC Orders ED Discharge Orders     None         Rondel Baton, MD 07/09/22 2213

## 2022-07-10 DIAGNOSIS — G253 Myoclonus: Secondary | ICD-10-CM | POA: Diagnosis not present

## 2022-07-10 DIAGNOSIS — Z7401 Bed confinement status: Secondary | ICD-10-CM | POA: Diagnosis not present

## 2022-07-10 DIAGNOSIS — R5381 Other malaise: Secondary | ICD-10-CM | POA: Diagnosis not present

## 2022-07-10 DIAGNOSIS — E871 Hypo-osmolality and hyponatremia: Secondary | ICD-10-CM | POA: Diagnosis not present

## 2022-07-10 DIAGNOSIS — R296 Repeated falls: Secondary | ICD-10-CM | POA: Diagnosis not present

## 2022-07-10 DIAGNOSIS — I10 Essential (primary) hypertension: Secondary | ICD-10-CM | POA: Diagnosis not present

## 2022-07-10 DIAGNOSIS — R4701 Aphasia: Secondary | ICD-10-CM | POA: Diagnosis not present

## 2022-07-10 DIAGNOSIS — I2510 Atherosclerotic heart disease of native coronary artery without angina pectoris: Secondary | ICD-10-CM | POA: Diagnosis not present

## 2022-07-10 DIAGNOSIS — G2581 Restless legs syndrome: Secondary | ICD-10-CM | POA: Diagnosis not present

## 2022-07-10 DIAGNOSIS — D649 Anemia, unspecified: Secondary | ICD-10-CM | POA: Diagnosis not present

## 2022-07-10 DIAGNOSIS — N189 Chronic kidney disease, unspecified: Secondary | ICD-10-CM | POA: Diagnosis not present

## 2022-07-10 DIAGNOSIS — F419 Anxiety disorder, unspecified: Secondary | ICD-10-CM | POA: Diagnosis not present

## 2022-07-10 DIAGNOSIS — R404 Transient alteration of awareness: Secondary | ICD-10-CM | POA: Diagnosis not present

## 2022-07-10 DIAGNOSIS — E785 Hyperlipidemia, unspecified: Secondary | ICD-10-CM | POA: Diagnosis not present

## 2022-07-12 DIAGNOSIS — I10 Essential (primary) hypertension: Secondary | ICD-10-CM | POA: Diagnosis not present

## 2022-07-14 DIAGNOSIS — K219 Gastro-esophageal reflux disease without esophagitis: Secondary | ICD-10-CM | POA: Diagnosis not present

## 2022-07-14 DIAGNOSIS — S0181XD Laceration without foreign body of other part of head, subsequent encounter: Secondary | ICD-10-CM | POA: Diagnosis not present

## 2022-07-14 DIAGNOSIS — R1312 Dysphagia, oropharyngeal phase: Secondary | ICD-10-CM | POA: Diagnosis not present

## 2022-07-14 DIAGNOSIS — I480 Paroxysmal atrial fibrillation: Secondary | ICD-10-CM | POA: Diagnosis not present

## 2022-07-14 DIAGNOSIS — M48061 Spinal stenosis, lumbar region without neurogenic claudication: Secondary | ICD-10-CM | POA: Diagnosis not present

## 2022-07-14 DIAGNOSIS — I10 Essential (primary) hypertension: Secondary | ICD-10-CM | POA: Diagnosis not present

## 2022-07-14 DIAGNOSIS — Z9181 History of falling: Secondary | ICD-10-CM | POA: Diagnosis not present

## 2022-07-14 DIAGNOSIS — H04123 Dry eye syndrome of bilateral lacrimal glands: Secondary | ICD-10-CM | POA: Diagnosis not present

## 2022-07-14 DIAGNOSIS — N202 Calculus of kidney with calculus of ureter: Secondary | ICD-10-CM | POA: Diagnosis not present

## 2022-07-14 DIAGNOSIS — Z515 Encounter for palliative care: Secondary | ICD-10-CM | POA: Diagnosis not present

## 2022-07-14 DIAGNOSIS — R5381 Other malaise: Secondary | ICD-10-CM | POA: Diagnosis not present

## 2022-07-14 DIAGNOSIS — E559 Vitamin D deficiency, unspecified: Secondary | ICD-10-CM | POA: Diagnosis not present

## 2022-07-14 DIAGNOSIS — I2510 Atherosclerotic heart disease of native coronary artery without angina pectoris: Secondary | ICD-10-CM | POA: Diagnosis not present

## 2022-07-14 DIAGNOSIS — Z950 Presence of cardiac pacemaker: Secondary | ICD-10-CM | POA: Diagnosis not present

## 2022-07-14 DIAGNOSIS — R4701 Aphasia: Secondary | ICD-10-CM | POA: Diagnosis not present

## 2022-07-14 DIAGNOSIS — G253 Myoclonus: Secondary | ICD-10-CM | POA: Diagnosis not present

## 2022-07-14 DIAGNOSIS — R296 Repeated falls: Secondary | ICD-10-CM | POA: Diagnosis not present

## 2022-07-14 DIAGNOSIS — E785 Hyperlipidemia, unspecified: Secondary | ICD-10-CM | POA: Diagnosis not present

## 2022-07-14 DIAGNOSIS — D649 Anemia, unspecified: Secondary | ICD-10-CM | POA: Diagnosis not present

## 2022-07-14 DIAGNOSIS — N189 Chronic kidney disease, unspecified: Secondary | ICD-10-CM | POA: Diagnosis not present

## 2022-07-14 DIAGNOSIS — F02C2 Dementia in other diseases classified elsewhere, severe, with psychotic disturbance: Secondary | ICD-10-CM | POA: Diagnosis not present

## 2022-07-15 ENCOUNTER — Other Ambulatory Visit: Payer: Self-pay

## 2022-07-15 DIAGNOSIS — D649 Anemia, unspecified: Secondary | ICD-10-CM

## 2022-07-15 DIAGNOSIS — L89153 Pressure ulcer of sacral region, stage 3: Secondary | ICD-10-CM | POA: Diagnosis not present

## 2022-07-16 ENCOUNTER — Other Ambulatory Visit: Payer: Self-pay

## 2022-07-16 ENCOUNTER — Inpatient Hospital Stay (HOSPITAL_BASED_OUTPATIENT_CLINIC_OR_DEPARTMENT_OTHER): Payer: Medicare Other | Admitting: Hematology

## 2022-07-16 ENCOUNTER — Inpatient Hospital Stay: Payer: Medicare Other | Attending: Hematology

## 2022-07-16 VITALS — BP 123/87 | HR 60 | Temp 98.3°F | Resp 18

## 2022-07-16 DIAGNOSIS — D649 Anemia, unspecified: Secondary | ICD-10-CM

## 2022-07-16 DIAGNOSIS — D696 Thrombocytopenia, unspecified: Secondary | ICD-10-CM | POA: Insufficient documentation

## 2022-07-16 DIAGNOSIS — R161 Splenomegaly, not elsewhere classified: Secondary | ICD-10-CM | POA: Diagnosis not present

## 2022-07-16 DIAGNOSIS — Z87891 Personal history of nicotine dependence: Secondary | ICD-10-CM | POA: Diagnosis not present

## 2022-07-16 DIAGNOSIS — D709 Neutropenia, unspecified: Secondary | ICD-10-CM | POA: Diagnosis not present

## 2022-07-16 DIAGNOSIS — D509 Iron deficiency anemia, unspecified: Secondary | ICD-10-CM | POA: Diagnosis not present

## 2022-07-16 DIAGNOSIS — Z8616 Personal history of COVID-19: Secondary | ICD-10-CM | POA: Diagnosis not present

## 2022-07-16 DIAGNOSIS — R296 Repeated falls: Secondary | ICD-10-CM | POA: Insufficient documentation

## 2022-07-16 DIAGNOSIS — Z993 Dependence on wheelchair: Secondary | ICD-10-CM | POA: Diagnosis not present

## 2022-07-16 DIAGNOSIS — D61818 Other pancytopenia: Secondary | ICD-10-CM | POA: Diagnosis not present

## 2022-07-16 DIAGNOSIS — F0390 Unspecified dementia without behavioral disturbance: Secondary | ICD-10-CM | POA: Insufficient documentation

## 2022-07-16 LAB — CBC WITH DIFFERENTIAL (CANCER CENTER ONLY)
Abs Immature Granulocytes: 0.01 10*3/uL (ref 0.00–0.07)
Basophils Absolute: 0 10*3/uL (ref 0.0–0.1)
Basophils Relative: 0 %
Eosinophils Absolute: 0 10*3/uL (ref 0.0–0.5)
Eosinophils Relative: 1 %
HCT: 25.7 % — ABNORMAL LOW (ref 39.0–52.0)
Hemoglobin: 8.2 g/dL — ABNORMAL LOW (ref 13.0–17.0)
Immature Granulocytes: 0 %
Lymphocytes Relative: 17 %
Lymphs Abs: 0.8 10*3/uL (ref 0.7–4.0)
MCH: 24.8 pg — ABNORMAL LOW (ref 26.0–34.0)
MCHC: 31.9 g/dL (ref 30.0–36.0)
MCV: 77.6 fL — ABNORMAL LOW (ref 80.0–100.0)
Monocytes Absolute: 0.3 10*3/uL (ref 0.1–1.0)
Monocytes Relative: 6 %
Neutro Abs: 3.4 10*3/uL (ref 1.7–7.7)
Neutrophils Relative %: 76 %
Platelet Count: 216 10*3/uL (ref 150–400)
RBC: 3.31 MIL/uL — ABNORMAL LOW (ref 4.22–5.81)
RDW: 16.5 % — ABNORMAL HIGH (ref 11.5–15.5)
WBC Count: 4.5 10*3/uL (ref 4.0–10.5)
nRBC: 0 % (ref 0.0–0.2)

## 2022-07-16 LAB — CMP (CANCER CENTER ONLY)
ALT: 19 U/L (ref 0–44)
AST: 19 U/L (ref 15–41)
Albumin: 3.3 g/dL — ABNORMAL LOW (ref 3.5–5.0)
Alkaline Phosphatase: 94 U/L (ref 38–126)
Anion gap: 5 (ref 5–15)
BUN: 24 mg/dL — ABNORMAL HIGH (ref 8–23)
CO2: 29 mmol/L (ref 22–32)
Calcium: 8.9 mg/dL (ref 8.9–10.3)
Chloride: 103 mmol/L (ref 98–111)
Creatinine: 1.15 mg/dL (ref 0.61–1.24)
GFR, Estimated: >60 mL/min (ref >60)
Glucose, Bld: 128 mg/dL — ABNORMAL HIGH (ref 70–99)
Potassium: 4.3 mmol/L (ref 3.5–5.1)
Sodium: 137 mmol/L (ref 135–145)
Total Bilirubin: 0.6 mg/dL (ref 0.3–1.2)
Total Protein: 5.8 g/dL — ABNORMAL LOW (ref 6.5–8.1)

## 2022-07-16 LAB — IRON AND IRON BINDING CAPACITY (CC-WL,HP ONLY)
Iron: 64 ug/dL (ref 45–182)
Saturation Ratios: 32 % (ref 17.9–39.5)
TIBC: 202 ug/dL — ABNORMAL LOW (ref 250–450)
UIBC: 138 ug/dL (ref 117–376)

## 2022-07-16 LAB — FERRITIN: Ferritin: 261 ng/mL (ref 24–336)

## 2022-07-16 NOTE — Progress Notes (Signed)
HEMATOLOGY/ONCOLOGY CLINIC NOTE  Date of Service: 05/20/2022  Patient Care Team: Merri Brunette, MD as PCP - General (Internal Medicine)  CHIEF COMPLAINTS/PURPOSE OF CONSULTATION:  Evaluation and management of anemia.  HISTORY OF PRESENTING ILLNESS:   Please see previous note for details on initial presentation  INTERVAL HISTORY  Brandon Fischer is a 89 y.o. male who returns today for management and evaluation of his anemia.  Patient's last seen by me on 05/20/2022 and was unable to speak. He had multiple falls, COVID-19 infection, urethral trauma, and head laceration.   He presented to the ED on 07/09/2022 for microcytic anemia with hemoglobin of 7.0, baseline of 8.  Today, he presents in a wheelchair and is accompanied by his daughter who provides history. His daughter will help with medical decision making through [facility].  Patient has no feelings of anxiousness or nausea. He has normal appetite and sleeping habits. His daughter reports that her father frequently consumes food quickly and has endorsed aspiratoin/choking issues on a couple of occasions.   She notes that patient's wife passed away 3 years ago. [name] reports that his dementia has worsened since February. His daughter reports that patient has been referred to hospice, and palliative care is scheduled to visit on Monday, 78/2024.   His daughter reports that patient had a hemoglobin level of 6.3 while at the nursing home recently. Once he presented to the ED, his hemoglobin level was at 7 and he did not receive a blood transfusion.   His daughter reports that patient frequently endorses falls, which causes bleeding from the head. Patient notes arm pain, likely from his falls. Patient denies any abdominal pain. Nursing home has not noted any other bleeding issues. Patient's son is also involved with his care.   MEDICAL HISTORY:  Past Medical History:  Diagnosis Date   Bilateral carotid artery stenosis  without cerebral infarction    ASYMPTOMATIC--  BILATERAL ICA  50-69%  PER CARDIOLOGIST NOTE, DR Jacinto Halim   Bilateral carotid bruits    LEFT  soft bruit, right no no carotid bruit per 07-24-2014 dr Jacinto Halim note   CKD (chronic kidney disease)    dr foster Robbie Lis kidney, ckd stage 3 per lov 02-16-2019   Complete heart block (HCC) 04/25/2021   Complication of anesthesia    1 episode atrial fib 02-09-2014 [facility] and followed up with cardiology, not further issue   Coronary artery disease    NON-OBSTRUCTIVE CAD  per cath 08-06-2004 HX PALPITATIONS-AND -TACHYCARDIA-   Encounter for care of pacemaker 04/26/2021   First degree heart block    Full dentures    GERD (gastroesophageal reflux disease)    Heart palpitations    Not any more   History of atrial fibrillation without current medication    EPISODE OF AFIB WITH RVR INTRAOPERATIVELY 02-09-2014 AT [facility]--  RESOLVED AND PT FOLLOWED UP WITH CARDIOLOGIST  DR Jacinto Halim   History of kidney stones    Hyperlipidemia    Hypertension    Memory loss    Nephrolithiasis    RIGHT   Organic impotence    Pacemaker: [employer] ASSURITY DR-RF - [id number] 04/26/2021   Psoriasis    Clearing up   PSVT (paroxysmal supraventricular tachycardia)    Right ureteral calculus    S/P radiofrequency ablation operation for arrhythmia-SVT 04/28/2014   Simple renal cyst    right   Wears glasses    Wears hearing aid    BILATERAL  History of malignant melanoma  status post Mohs surgery to remove the skin lesion on the face with skin grafting. 3 years ago.  SURGICAL HISTORY: Past Surgical History:  Procedure Laterality Date   CARDIAC CATHETERIZATION  08-06-2004 dr Reyes Ivan   (abnormal stress test) Non-obstructive CAD, pLAD 20-30%,  LCX 30-40%, pRCA 20-30%, dRCA 40%, distal LM  mild diffuse calcifation 20-30% tapering stenosis,  preserved LVSF ef 55-60%   CARPAL TUNNEL RELEASE Bilateral 2005   CATARACT EXTRACTION, BILATERAL Bilateral 2011   CHOLECYSTECTOMY      CHOLECYSTECTOMY OPEN  1982   and NEPHROLITHOTOMY   COLONOSCOPY W/ POLYPECTOMY  last one 04-05-2008   CYSTOSCOPY WITH LITHOLAPAXY Right 04/14/2017   Procedure: CYSTOSCOPY WITH LITHOLAPAXY/RIGHT RETROGRADE PLYOGRAM/RIGHT URETEROSCOPY;  Surgeon: Bjorn Pippin, MD;  Location: [facility];  Service: Urology;  Laterality: Right;   CYSTOSCOPY WITH RETROGRADE PYELOGRAM, URETEROSCOPY AND STENT PLACEMENT Right 03/14/2014   Procedure: CYSTO/RIGHT RETROGRADE PYELOGRAM/RIGHT URETEROSCOPY/STONE EXTRACTION/STENT PLACEMENT;  Surgeon: Anner Crete, MD;  Location: [facility];  Service: Urology;  Laterality: Right;   CYSTOSCOPY WITH RETROGRADE PYELOGRAM, URETEROSCOPY AND STENT PLACEMENT Left 04/19/2019   Procedure: CYSTOSCOPY WITH LEFT  RETROGRADE LEFT , URETEROSCOPY WITH HOLMIUM LASER, BASKET EXTRACTION AND STENT PLACEMENT;  Surgeon: Bjorn Pippin, MD;  Location: [facility];  Service: Urology;  Laterality: Left;   CYSTOSCOPY WITH RETROGRADE PYELOGRAM, URETEROSCOPY AND STENT PLACEMENT Left 03/30/2020   Procedure: CYSTOSCOPY WITH RETROGRADE PYELOGRAM, URETEROSCOPY, BASKETTING OF STONE AND STENT PLACEMENT;  Surgeon: Sebastian Ache, MD;  Location: [facility]  Service: Urology;  Laterality: Left;   EYE SURGERY Bilateral    Cataracts removed   HOLMIUM LASER APPLICATION Right 03/14/2014   Procedure: RIGHT HOLMIUM LASER APPLICATION;  Surgeon: Anner Crete, MD;  Location: [facility];  Service: Urology;  Laterality: Right;   KNEE ARTHROSCOPY W/ DEBRIDEMENT Right 11/23/2002   and Synovectomy   LUMBAR LAMINECTOMY/DECOMPRESSION MICRODISCECTOMY N/A 07/15/2018   Procedure: Posterior lumbar decompression and fusion L4-5;  Surgeon: Venita Lick, MD;  Location: [facility];  Service: Orthopedics;  Laterality: N/A;  4 hrs   NEPHROLITHOTOMY  01/16/2011   Procedure: NEPHROLITHOTOMY PERCUTANEOUS;  Surgeon: Anner Crete;  Location: [facility];  Service: Urology;  Laterality: Left;   C-Arm  Holmium Laser   NEPHROLITHOTOMY  1981   PACEMAKER IMPLANT N/A 04/26/2021   Procedure: PACEMAKER IMPLANT;  Surgeon: Regan Lemming, MD;  Location: [facility];  Service: Cardiovascular;  Laterality: N/A;   SUPRAVENTRICULAR TACHYCARDIA ABLATION N/A 04/28/2014   Procedure: SUPRAVENTRICULAR TACHYCARDIA ABLATION;  Surgeon: Marinus Maw, MD;  Location: [facility];  Service: Cardiovascular;  Laterality: N/A;    SOCIAL HISTORY: Social History   Socioeconomic History   Marital status: Widowed    Spouse name: Not on file   Number of children: 2   Years of education: Not on file   Highest education level: Some college, no degree  Occupational History   Occupation: postal service     Comment: retired  Tobacco Use   Smoking status: Former    Packs/day: 1.00    Years: 40.00    Additional pack years: 0.00    Total pack years: 40.00    Types: Cigarettes    Quit date: 02/06/1998    Years since quitting: 24.2   Smokeless tobacco: Never  Vaping Use   Vaping Use: Never used  Substance and Sexual Activity   Alcohol use: No   Drug use: No   Sexual activity: Not on file  Other Topics Concern  Not on file  Social History Narrative   06/25/21 lives alone, 2 children are in and out all day, has Medical alert   Social Determinants of Health   Financial Resource Strain: Not on file  Food Insecurity: No Food Insecurity (02/01/2022)   Hunger Vital Sign    Worried About Running Out of Food in the Last Year: Never true    Ran Out of Food in the Last Year: Never true  Transportation Needs: No Transportation Needs (02/01/2022)   PRAPARE - Administrator, Civil Service (Medical): No    Lack of Transportation (Non-Medical): No  Physical Activity: Not on file  Stress: Not on file  Social Connections: Not on file  Intimate Partner Violence: Not At Risk (02/01/2022)   Humiliation, Afraid, Rape, and Kick questionnaire    Fear of Current or Ex-Partner: No    Emotionally  Abused: No    Physically Abused: No    Sexually Abused: No    FAMILY HISTORY: Family History  Problem Relation Age of Onset   Cancer Mother        Ovarian or colon   Heart attack Father    Heart attack Brother     ALLERGIES:  is allergic to bupropion, trazodone, and olmesartan.  MEDICATIONS:  Current Outpatient Medications  Medication Sig Dispense Refill   albuterol (VENTOLIN HFA) 108 (90 Base) MCG/ACT inhaler Inhale 2 puffs into the lungs every 4 (four) hours as needed for wheezing or shortness of breath.     ascorbic acid (VITAMIN C) 500 MG tablet Take 500 mg by mouth in the morning.     aspirin (ASPIRIN CHILDRENS) 81 MG chewable tablet Chew 1 tablet (81 mg total) by mouth daily.     atorvastatin (LIPITOR) 20 MG tablet Take 20 mg by mouth every evening.     Cholecalciferol (VITAMIN D-3) 25 MCG (1000 UT) CAPS Take 1,000 Units by mouth daily with breakfast.     hydrALAZINE (APRESOLINE) 10 MG tablet Take 1 tablet (10 mg total) by mouth 2 (two) times daily. (Patient not taking: Reported on 04/23/2022) 60 tablet 0   hydrALAZINE (APRESOLINE) 25 MG tablet Take 25 mg by mouth 3 (three) times daily.     metoprolol succinate (TOPROL-XL) 25 MG 24 hr tablet TAKE 1 TABLET (25 MG TOTAL) BY MOUTH DAILY. (Patient taking differently: Take 25 mg by mouth in the morning.) 90 tablet 3   Multiple Vitamins-Iron (MULTIVITAMIN/IRON PO) Take 1 tablet by mouth daily with breakfast.     pantoprazole (PROTONIX) 40 MG tablet Take 40 mg by mouth in the morning and at bedtime.      REFRESH TEARS 0.5 % SOLN Place 1 drop into both eyes in the morning and at bedtime.     tamsulosin (FLOMAX) 0.4 MG CAPS capsule Take 0.4 mg by mouth every evening.     traMADol (ULTRAM) 50 MG tablet Take 1 tablet (50 mg total) by mouth every 6 (six) hours as needed for moderate pain or severe pain. Post-operatively (Patient taking differently: Take 50 mg by mouth every 6 (six) hours as needed (for pain).) 15 tablet 0   vitamin B-12 100  MCG tablet Take 1 tablet (100 mcg total) by mouth daily. (Patient not taking: Reported on 04/23/2022) 30 tablet 0   zinc gluconate 50 MG tablet Take 50 mg by mouth daily.     No current facility-administered medications for this visit.    REVIEW OF SYSTEMS:    10 Point review of Systems was done  is negative except as noted above.   PHYSICAL EXAMINATION:  ECOG FS:1 - Symptomatic but completely ambulatory  Vitals:   05/20/22 1334  BP: (!) 127/56  Pulse: 72  Resp: 18  Temp: 98.2 F (36.8 C)  SpO2: 100%     Wt Readings from Last 3 Encounters:  05/15/22 180 lb (81.6 kg)  04/28/22 175 lb (79.4 kg)  03/31/22 175 lb 9.6 oz (79.7 kg)   There is no height or weight on file to calculate BMI.    GENERAL:alert, in no acute distress and comfortable SKIN: no acute rashes, no significant lesions EYES: conjunctiva are pink and non-injected, sclera anicteric OROPHARYNX: MMM, no exudates, no oropharyngeal erythema or ulceration NECK: supple, no JVD LYMPH:  no palpable lymphadenopathy in the cervical, axillary or inguinal regions LUNGS: clear to auscultation b/l with normal respiratory effort HEART: regular rate & rhythm ABDOMEN:  normoactive bowel sounds , non tender, not distended. Extremity: no pedal edema PSYCH: alert & oriented x 3 with fluent speech NEURO: no focal motor/sensory deficits   LABORATORY DATA:  I have reviewed the data as listed  .    Latest Ref Rng & Units 07/16/2022   11:56 AM 07/09/2022    7:47 PM 07/04/2022    4:21 AM  CBC  WBC 4.0 - 10.5 K/uL 4.5  4.7  6.9   Hemoglobin 13.0 - 17.0 g/dL 8.2  7.0  8.3   Hematocrit 39.0 - 52.0 % 25.7  22.4  26.3   Platelets 150 - 400 K/uL 216  186  180     CBC    Component Value Date/Time   WBC 4.5 07/16/2022 1156   WBC 4.7 07/09/2022 1947   RBC 3.31 (L) 07/16/2022 1156   HGB 8.2 (L) 07/16/2022 1156   HGB 12.0 (L) 12/24/2016 1113   HCT 25.7 (L) 07/16/2022 1156   HCT 30.4 (L) 06/17/2021 1330   HCT 36.2 (L) 12/24/2016  1113   PLT 216 07/16/2022 1156   PLT 123 (L) 12/24/2016 1113   MCV 77.6 (L) 07/16/2022 1156   MCV 79.7 12/24/2016 1113   MCH 24.8 (L) 07/16/2022 1156   MCHC 31.9 07/16/2022 1156   RDW 16.5 (H) 07/16/2022 1156   RDW 16.2 (H) 12/24/2016 1113   LYMPHSABS 0.8 07/16/2022 1156   LYMPHSABS 0.7 (L) 12/24/2016 1113   MONOABS 0.3 07/16/2022 1156   MONOABS 0.4 12/24/2016 1113   EOSABS 0.0 07/16/2022 1156   EOSABS 0.0 12/24/2016 1113   BASOSABS 0.0 07/16/2022 1156   BASOSABS 0.0 12/24/2016 1113    .    Latest Ref Rng & Units 07/16/2022   11:56 AM 07/09/2022    7:47 PM 07/04/2022    4:21 AM  CMP  Glucose 70 - 99 mg/dL 540  981  191   BUN 8 - 23 mg/dL 24  32  21   Creatinine 0.61 - 1.24 mg/dL 4.78  2.95  6.21   Sodium 135 - 145 mmol/L 137  137  137   Potassium 3.5 - 5.1 mmol/L 4.3  4.0  4.1   Chloride 98 - 111 mmol/L 103  106  105   CO2 22 - 32 mmol/L 29  25  24    Calcium 8.9 - 10.3 mg/dL 8.9  8.3  8.6   Total Protein 6.5 - 8.1 g/dL 5.8  6.1    Total Bilirubin 0.3 - 1.2 mg/dL 0.6  0.9    Alkaline Phos 38 - 126 U/L 94  77    AST 15 -  41 U/L 19  31    ALT 0 - 44 U/L 19  28     . Lab Results  Component Value Date   LDH 207 (H) 05/20/2022    . Lab Results  Component Value Date   IRON 64 07/16/2022   TIBC 202 (L) 07/16/2022   IRONPCTSAT 32 07/16/2022   (Iron and TIBC)  Lab Results  Component Value Date   FERRITIN 261 07/16/2022    Component     Latest Ref Rng & Units 09/09/2016  IgG (Immunoglobin G), Serum     700 - 1,600 mg/dL 161  IgA/Immunoglobulin A, Serum     61 - 437 mg/dL 096  IgM, Qn, Serum     15 - 143 mg/dL 43  Total Protein     6.0 - 8.5 g/dL 6.1  Albumin SerPl Elph-Mcnc     2.9 - 4.4 g/dL 3.7  Alpha 1     0.0 - 0.4 g/dL 0.3  Alpha2 Glob SerPl Elph-Mcnc     0.4 - 1.0 g/dL 0.5  B-Globulin SerPl Elph-Mcnc     0.7 - 1.3 g/dL 0.8  Gamma Glob SerPl Elph-Mcnc     0.4 - 1.8 g/dL 0.7  M Protein SerPl Elph-Mcnc     Not Observed g/dL Not Observed  Globulin,  Total     2.2 - 3.9 g/dL 2.4  Albumin/Glob SerPl     0.7 - 1.7 1.6  IFE 1      Comment  Please Note (HCV):      Comment  Iron     42 - 163 ug/dL 64  TIBC     045 - 409 ug/dL 811 (L)  UIBC     914 - 376 ug/dL 782  %SAT     20 - 55 % 33  Folate, Hemolysate     Not Estab. ng/mL 369.8  HCT     37.5 - 51.0 % 28.0 (L)  Folate, RBC     >498 ng/mL 1,321  Ig Kappa Free Light Chain     3.3 - 19.4 mg/L 21.4 (H)  Ig Lambda Free Light Chain     5.7 - 26.3 mg/L 36.6 (H)  Kappa/Lambda FluidC Ratio     0.26 - 1.65 0.58  LDH     125 - 245 U/L 326 (H)  Sed Rate     0 - 30 mm/hr 2  Vitamin B12     232 - 1245 pg/mL 1,261 (H)  TSH     0.320 - 4.118 m(IU)/L 2.609  Hep C Virus Ab     0.0 - 0.9 s/co ratio 0.1  Ferritin     22 - 316 ng/ml 399 (H)   Component     Latest Ref Rng & Units 09/17/2016 09/18/2016  Prothrombin Time     11.4 - 15.2 seconds  14.2  INR       1.11  LDH     125 - 245 U/L 309 (H)   Haptoglobin     34 - 200 mg/dL <95 (L)   Coombs', Direct     Negative Negative   APTT     24 - 36 seconds  32   Component     Latest Ref Rng & Units 09/30/2016  RA Latex Turbid.     0.0 - 13.9 IU/mL <10.0  CCP Antibodies IgG/IgA     0 - 19 units 12  ANA Ab, IFA      Negative  Sed Rate  0 - 30 mm/hr 2   Component     Latest Ref Rng & Units 09/30/2016 09/30/2016 09/30/2016 10/22/2016         3:23 PM  3:23 PM  3:23 PM   Interpretation       Comment    Comment:       Comment Comment   Specimen:       Peripheral Blood    Submitted Dx:       Comment    Viability:       97%    Cell Population       Comment    Granulocytes:       Comment    Monocytes:       Comment    Antibodies Performed:       Comment    Director Review       Comment    RA Latex Turbid.     0.0 - 13.9 IU/mL <10.0     CCP Antibodies IgG/IgA     0 - 19 units 12     ANA Ab, IFA      Negative     LDH     125 - 245 U/L    185           RADIOGRAPHIC STUDIES: I have personally reviewed  the radiological images as listed and agreed with the findings in the report. DG Chest 2 View  Result Date: 04/28/2022 CLINICAL DATA:  Coughing and altered mental status. EXAM: CHEST - 2 VIEW COMPARISON:  CTA chest 04/23/2022, portable chest 04/23/2022 FINDINGS: There is mild cardiomegaly without overt CHF with stable appearance of a left chest dual lead pacing system and wires. There are multiple overlying monitor wires. There is calcification in the transverse aorta. The mediastinum is normally outlined. The sulci are sharp. The lungs are radiographically clear. There was a patchy hazy left lower lobe infiltrate on the CT which either has resolved or is resolving and below the limits of radiographic resolution. There is bridging enthesopathy and marginal osteophytes throughout the thoracic spine. Mild osteopenia. IMPRESSION: 1. No acute radiographic chest findings. 2. Mild cardiomegaly. 3. Aortic atherosclerosis. 4. Left lower lobe patchy infiltrate seen on CT 5 days ago is either resolved or resolving and below the limits of radiographic resolution. Electronically Signed   By: Almira Bar M.D.   On: 04/28/2022 20:17   CT Cervical Spine Wo Contrast  Result Date: 04/28/2022 CLINICAL DATA:  Status post trauma. EXAM: CT CERVICAL SPINE WITHOUT CONTRAST TECHNIQUE: Multidetector CT imaging of the cervical spine was performed without intravenous contrast. Multiplanar CT image reconstructions were also generated. RADIATION DOSE REDUCTION: This exam was performed according to the departmental dose-optimization program which includes automated exposure control, adjustment of the mA and/or kV according to patient size and/or use of iterative reconstruction technique. COMPARISON:  February 07, 2022 FINDINGS: Alignment: No acute subluxation. Skull base and vertebrae: No acute fracture. No primary bone lesion or focal pathologic process. Soft tissues and spinal canal: No prevertebral fluid or swelling. No visible canal  hematoma. Disc levels: Marked severity endplate sclerosis, anterior osteophyte formation and posterior bony spurring are seen at the levels of C4-C5, C5-C6, C6-C7 and C7-T1. Moderate severity anterior osteophyte formation is also seen at C2-C3 and C3-C4. There is marked severity narrowing of the anterior atlantoaxial articulation. Marked severity intervertebral disc space narrowing is seen at C4-C5, C5-C6, C6-C7 and C7-T1. Bilateral marked severity multilevel facet joint hypertrophy is noted. Upper chest: Negative. Other: None.  IMPRESSION: Marked severity multilevel degenerative changes without evidence of an acute fracture or subluxation. Electronically Signed   By: Aram Candela M.D.   On: 04/28/2022 19:18   CT Head Wo Contrast  Result Date: 04/28/2022 CLINICAL DATA:  Status post trauma. EXAM: CT HEAD WITHOUT CONTRAST TECHNIQUE: Contiguous axial images were obtained from the base of the skull through the vertex without intravenous contrast. RADIATION DOSE REDUCTION: This exam was performed according to the departmental dose-optimization program which includes automated exposure control, adjustment of the mA and/or kV according to patient size and/or use of iterative reconstruction technique. COMPARISON:  February 07, 2022 FINDINGS: Brain: There is moderate severity cerebral atrophy with widening of the extra-axial spaces and ventricular dilatation. There are areas of decreased attenuation within the white matter tracts of the supratentorial brain, consistent with microvascular disease changes. Vascular: No hyperdense vessel or unexpected calcification. Skull: Normal. Negative for fracture or focal lesion. Sinuses/Orbits: There is mild left maxillary sinus and mild bilateral ethmoid sinus mucosal thickening. Other: Mild right frontal scalp soft tissue swelling is seen. IMPRESSION: 1. Mild right frontal scalp soft tissue swelling without evidence for an acute fracture or acute intracranial abnormality. 2.  Generalized cerebral atrophy with chronic white matter small vessel ischemic changes. 3. Mild left maxillary sinus and bilateral ethmoid sinus disease. Electronically Signed   By: Aram Candela M.D.   On: 04/28/2022 19:15   CT Angio Chest PE W and/or Wo Contrast  Result Date: 04/23/2022 CLINICAL DATA:  Shortness of breath and elevated D-dimer, COVID-19 positivity EXAM: CT ANGIOGRAPHY CHEST WITH CONTRAST TECHNIQUE: Multidetector CT imaging of the chest was performed using the standard protocol during bolus administration of intravenous contrast. Multiplanar CT image reconstructions and MIPs were obtained to evaluate the vascular anatomy. RADIATION DOSE REDUCTION: This exam was performed according to the departmental dose-optimization program which includes automated exposure control, adjustment of the mA and/or kV according to patient size and/or use of iterative reconstruction technique. CONTRAST:  80mL OMNIPAQUE IOHEXOL 350 MG/ML SOLN COMPARISON:  Chest x-ray from earlier in the same day FINDINGS: Cardiovascular: Atherosclerotic calcifications of the thoracic aorta are noted. No aneurysmal dilatation or dissection is noted. Heart is mildly enlarged in size. Pacing device is again noted. The pulmonary artery shows a normal branching pattern bilaterally. No focal filling defect to suggest pulmonary embolism is noted. Mediastinum/Nodes: Thoracic inlet is within normal limits. The esophagus as visualized is within normal limits. No hilar or mediastinal adenopathy is noted. Lungs/Pleura: Lungs are well aerated bilaterally. Mild reticulonodular infiltrate is noted in the left lower lobe. No effusion or pneumothorax is seen. Upper Abdomen: Visualized upper abdomen shows changes of prior cholecystectomy. No acute abnormality is noted. Musculoskeletal: No acute rib abnormality is noted. Degenerative changes of the thoracic spine are seen. Review of the MIP images confirms the above findings. IMPRESSION: Mild early  infiltrate in the left lower lobe. No evidence of pulmonary emboli. No other focal abnormality is noted. Aortic Atherosclerosis (ICD10-I70.0). Electronically Signed   By: Alcide Clever M.D.   On: 04/23/2022 22:17   DG Chest Portable 1 View  Result Date: 04/23/2022 CLINICAL DATA:  Cough EXAM: PORTABLE CHEST 1 VIEW COMPARISON:  Chest x-ray dated January 31, 2022 FINDINGS: Cardiac and mediastinal contours are unchanged. Left chest wall dual lead pacer, unchanged when compared with the prior exam. Lungs are clear. No evidence of pleural effusion or pneumothorax. IMPRESSION: No active disease. Electronically Signed   By: Allegra Lai M.D.   On: 04/23/2022 18:45  ASSESSMENT & PLAN:   #1 h/o Pancytopenia with normocytic Anemia, neutropenia and thrombocytopenia  This could certainly be from the patient's splenomegaly causing hypersplenism. Etiology of the splenomegaly is unclear.  09/18/16 Bone marrow showed no overt evidence of MDS/Myelofibrosis no evidence of acute leukemia Increased CD8+ T cell population ? Reactive vs clonal. Normal cytogenetics. Sed rate WNL neg ANA and Rheumatoid panel  No evidence of monoclonal paraproteinemia. TSH levels within normal limits B12, folate and iron levels within normal limits. Hepatitis C negative  Given improvement in counts - the T cell population likely reactive and could be related to a viral infection that appears to be resolving now.  05/07/17 US Abdomen revealed decreased spleen size to about 900 cubic cm, and explains mild thrombocytopenia with PLT at 128k   10/02/17 US Abdomen revealed Status post cholecystectomy. Bilateral simple renal cysts are noted. Mild splenomegaly is noted with calculated volume of 630 cubic cm, which is decreased compared to prior exam.  #2  Currently Anemia - borderline microcytic with normal PLTs and WBC count  PLAN:  -Patient does have cognitive problems related to dementia which limits his speech  ability -[name] is the [facility] and will be making medical decisions on her father's behalf -Discussed lab results on 07/16/2022 in detail with patient. CBC showed WBC of 4.5K, hemoglobin of 8.2, and platelets of 216K.  -His platelet counts and WBC are currently normal. Discussed the main issue of blood counts dropping from iron deficiency/chronic kidney disease/blood loss from injuries  -discussed results of 02/06/2022 MRI which showed severe ischemic disease. Demential is less likely alzeimer, more likely vascular dementia -RBC is low as a function of obvious factors and potentially from some genetic change from the bone marrow -discussed hemoglobin goal of 8 -informed patient that hemoglobin levels may fluctuate with hydration status -informed patient's daughter that bone marrow function typically slows with age -informed patient that aspiration/choking issues may be related to dementia -discussed options of involved treatment including bone marrow biopsy, frequent transfusion support, IV iron, EPO shots -not unreasonable to treat with IV iron to keep iron levels higher than normal and to replace erythropoietin with injections every 2-4 weeks to potentially hold hemoglobin levels.  -invasive bone marrow testing may not be recommended with patient's advanced dementia -discussed option of hospice care for comfort support, pt is a candidate for hospice based on his dementia status -patient's daughter would like to take some time prior to making a decision of whether to proceed with treatment or hospice care -Continue iron supplement.  -answered all of patient's questions in detail  FOLLOW-UP: -Daughter to define goals of care and call us back  The total time spent in the appointment was 30 minutes* .  All of the patient's questions were answered with apparent satisfaction. The patient knows to call the clinic with any problems, questions or concerns.   Wyvonnia Lora MD MS AAHIVMS [facility]  [facility] Hematology/Oncology Physician [facility]  .*Total Encounter Time as defined by the Centers for Medicare and Medicaid Services includes, in addition to the face-to-face time of a patient visit (documented in the note above) non-face-to-face time: obtaining and reviewing outside history, ordering and reviewing medications, tests or procedures, care coordination (communications with other health care professionals or caregivers) and documentation in the medical record.    I,Mitra Faeizi,acting as a Neurosurgeon for Wyvonnia Lora, MD.,have documented all relevant documentation on the behalf of Wyvonnia Lora, MD,as directed by  Wyvonnia Lora, MD while in the presence  of Wyvonnia Lora, MD.  .I have reviewed the above documentation for accuracy and completeness, and I agree with the above. Johney Maine MD

## 2022-07-21 ENCOUNTER — Encounter: Payer: Self-pay | Admitting: Hematology

## 2022-07-21 ENCOUNTER — Telehealth: Payer: Self-pay

## 2022-07-21 NOTE — Telephone Encounter (Signed)
Transition Care Management Follow-up Telephone Call Date of discharge and from where: [facility] How have you been since you were released from the hospital? Doing ok and back at the [facility] Any questions or concerns? No  Items Reviewed: Did the pt receive and understand the discharge instructions provided? Yes  Medications obtained and verified? Yes  Other? No  Any new allergies since your discharge? No  Dietary orders reviewed? No Do you have support at home? Yes     Follow up appointments reviewed:  PCP Hospital f/u appt confirmed? Yes  Scheduled to see  on  @ . Specialist Hospital f/u appt confirmed? Yes  Scheduled to see  on  @ . Are transportation arrangements needed? No  If their condition worsens, is the pt aware to call PCP or go to the Emergency Dept.? Yes Was the patient provided with contact information for the PCP's office or ED? Yes Was to pt encouraged to call back with questions or concerns? Yes

## 2022-07-22 DIAGNOSIS — L89153 Pressure ulcer of sacral region, stage 3: Secondary | ICD-10-CM | POA: Diagnosis not present

## 2022-07-23 DIAGNOSIS — N400 Enlarged prostate without lower urinary tract symptoms: Secondary | ICD-10-CM | POA: Diagnosis not present

## 2022-07-23 DIAGNOSIS — R296 Repeated falls: Secondary | ICD-10-CM | POA: Diagnosis not present

## 2022-07-23 DIAGNOSIS — I10 Essential (primary) hypertension: Secondary | ICD-10-CM | POA: Diagnosis not present

## 2022-07-23 DIAGNOSIS — R4701 Aphasia: Secondary | ICD-10-CM | POA: Diagnosis not present

## 2022-07-23 DIAGNOSIS — E785 Hyperlipidemia, unspecified: Secondary | ICD-10-CM | POA: Diagnosis not present

## 2022-07-23 DIAGNOSIS — K219 Gastro-esophageal reflux disease without esophagitis: Secondary | ICD-10-CM | POA: Diagnosis not present

## 2022-07-23 DIAGNOSIS — R5381 Other malaise: Secondary | ICD-10-CM | POA: Diagnosis not present

## 2022-07-23 DIAGNOSIS — I480 Paroxysmal atrial fibrillation: Secondary | ICD-10-CM | POA: Diagnosis not present

## 2022-07-23 DIAGNOSIS — G253 Myoclonus: Secondary | ICD-10-CM | POA: Diagnosis not present

## 2022-07-23 DIAGNOSIS — D649 Anemia, unspecified: Secondary | ICD-10-CM | POA: Diagnosis not present

## 2022-07-23 DIAGNOSIS — F0390 Unspecified dementia without behavioral disturbance: Secondary | ICD-10-CM | POA: Diagnosis not present

## 2022-07-23 DIAGNOSIS — E559 Vitamin D deficiency, unspecified: Secondary | ICD-10-CM | POA: Diagnosis not present

## 2022-07-24 DIAGNOSIS — I482 Chronic atrial fibrillation, unspecified: Secondary | ICD-10-CM | POA: Diagnosis not present

## 2022-07-24 DIAGNOSIS — E785 Hyperlipidemia, unspecified: Secondary | ICD-10-CM | POA: Diagnosis not present

## 2022-07-24 DIAGNOSIS — D649 Anemia, unspecified: Secondary | ICD-10-CM | POA: Diagnosis not present

## 2022-07-24 DIAGNOSIS — K219 Gastro-esophageal reflux disease without esophagitis: Secondary | ICD-10-CM | POA: Diagnosis not present

## 2022-07-24 DIAGNOSIS — F0153 Vascular dementia, unspecified severity, with mood disturbance: Secondary | ICD-10-CM | POA: Diagnosis not present

## 2022-07-24 DIAGNOSIS — I442 Atrioventricular block, complete: Secondary | ICD-10-CM | POA: Diagnosis not present

## 2022-07-24 DIAGNOSIS — G2581 Restless legs syndrome: Secondary | ICD-10-CM | POA: Diagnosis not present

## 2022-07-24 DIAGNOSIS — I672 Cerebral atherosclerosis: Secondary | ICD-10-CM | POA: Diagnosis not present

## 2022-07-24 DIAGNOSIS — N400 Enlarged prostate without lower urinary tract symptoms: Secondary | ICD-10-CM | POA: Diagnosis not present

## 2022-07-24 DIAGNOSIS — I129 Hypertensive chronic kidney disease with stage 1 through stage 4 chronic kidney disease, or unspecified chronic kidney disease: Secondary | ICD-10-CM | POA: Diagnosis not present

## 2022-07-24 DIAGNOSIS — I471 Supraventricular tachycardia, unspecified: Secondary | ICD-10-CM | POA: Diagnosis not present

## 2022-07-24 DIAGNOSIS — L89152 Pressure ulcer of sacral region, stage 2: Secondary | ICD-10-CM | POA: Diagnosis not present

## 2022-07-24 DIAGNOSIS — D61818 Other pancytopenia: Secondary | ICD-10-CM | POA: Diagnosis not present

## 2022-07-24 DIAGNOSIS — F0154 Vascular dementia, unspecified severity, with anxiety: Secondary | ICD-10-CM | POA: Diagnosis not present

## 2022-07-24 DIAGNOSIS — N1832 Chronic kidney disease, stage 3b: Secondary | ICD-10-CM | POA: Diagnosis not present

## 2022-07-24 DIAGNOSIS — I2510 Atherosclerotic heart disease of native coronary artery without angina pectoris: Secondary | ICD-10-CM | POA: Diagnosis not present

## 2022-07-24 DIAGNOSIS — H04129 Dry eye syndrome of unspecified lacrimal gland: Secondary | ICD-10-CM | POA: Diagnosis not present

## 2022-07-24 DIAGNOSIS — I6523 Occlusion and stenosis of bilateral carotid arteries: Secondary | ICD-10-CM | POA: Diagnosis not present

## 2022-07-24 DIAGNOSIS — Z950 Presence of cardiac pacemaker: Secondary | ICD-10-CM | POA: Diagnosis not present

## 2022-07-25 DIAGNOSIS — D649 Anemia, unspecified: Secondary | ICD-10-CM | POA: Diagnosis not present

## 2022-07-25 DIAGNOSIS — F0153 Vascular dementia, unspecified severity, with mood disturbance: Secondary | ICD-10-CM | POA: Diagnosis not present

## 2022-07-25 DIAGNOSIS — F0154 Vascular dementia, unspecified severity, with anxiety: Secondary | ICD-10-CM | POA: Diagnosis not present

## 2022-07-25 DIAGNOSIS — I672 Cerebral atherosclerosis: Secondary | ICD-10-CM | POA: Diagnosis not present

## 2022-07-25 DIAGNOSIS — D61818 Other pancytopenia: Secondary | ICD-10-CM | POA: Diagnosis not present

## 2022-07-25 DIAGNOSIS — L89152 Pressure ulcer of sacral region, stage 2: Secondary | ICD-10-CM | POA: Diagnosis not present

## 2022-07-26 DIAGNOSIS — D61818 Other pancytopenia: Secondary | ICD-10-CM | POA: Diagnosis not present

## 2022-07-26 DIAGNOSIS — L89152 Pressure ulcer of sacral region, stage 2: Secondary | ICD-10-CM | POA: Diagnosis not present

## 2022-07-26 DIAGNOSIS — F0154 Vascular dementia, unspecified severity, with anxiety: Secondary | ICD-10-CM | POA: Diagnosis not present

## 2022-07-26 DIAGNOSIS — D649 Anemia, unspecified: Secondary | ICD-10-CM | POA: Diagnosis not present

## 2022-07-26 DIAGNOSIS — F0153 Vascular dementia, unspecified severity, with mood disturbance: Secondary | ICD-10-CM | POA: Diagnosis not present

## 2022-07-26 DIAGNOSIS — I672 Cerebral atherosclerosis: Secondary | ICD-10-CM | POA: Diagnosis not present

## 2022-07-28 DIAGNOSIS — I442 Atrioventricular block, complete: Secondary | ICD-10-CM | POA: Diagnosis not present

## 2022-07-28 DIAGNOSIS — Z45018 Encounter for adjustment and management of other part of cardiac pacemaker: Secondary | ICD-10-CM | POA: Diagnosis not present

## 2022-08-01 ENCOUNTER — Ambulatory Visit: Payer: 59 | Admitting: Cardiology

## 2022-08-11 ENCOUNTER — Telehealth: Payer: Self-pay | Admitting: Cardiology

## 2022-08-14 DIAGNOSIS — 419620001 Death: Secondary | SNOMED CT | POA: Diagnosis not present

## 2022-08-14 DEATH — deceased

## 2022-08-22 ENCOUNTER — Telehealth: Payer: Self-pay | Admitting: Diagnostic Neuroimaging

## 2022-08-22 NOTE — Telephone Encounter (Signed)
Pt's daughter notified [facility] patient has passed away.

## 2022-08-25 ENCOUNTER — Institutional Professional Consult (permissible substitution): Payer: Medicare Other | Admitting: Diagnostic Neuroimaging

## 2022-08-25 NOTE — Telephone Encounter (Signed)
I have started a sympathy card and placed it in POD 3.

## 2022-08-27 NOTE — Telephone Encounter (Signed)
Routed to clinical team.
